# Patient Record
Sex: Male | Born: 1990 | Race: Black or African American | Hispanic: No | Marital: Single | Smoking: Never smoker
Health system: Southern US, Community
[De-identification: ages and names within clinical notes are randomized; demographics above are authoritative.]

## PROBLEM LIST (undated history)

## (undated) DIAGNOSIS — F1010 Alcohol abuse, uncomplicated: Secondary | ICD-10-CM

## (undated) DIAGNOSIS — I10 Essential (primary) hypertension: Secondary | ICD-10-CM

---

## 2016-12-11 ENCOUNTER — Encounter (HOSPITAL_COMMUNITY): Payer: Self-pay | Admitting: Emergency Medicine

## 2016-12-11 ENCOUNTER — Inpatient Hospital Stay (HOSPITAL_COMMUNITY)
Admission: EM | Admit: 2016-12-11 | Discharge: 2016-12-29 | DRG: 896 | Disposition: A | Discharge status: Other Acute Inpatient Hospital | Payer: Managed Care, Other (non HMO) | Attending: Internal Medicine | Admitting: Internal Medicine

## 2016-12-11 ENCOUNTER — Ambulatory Visit (HOSPITAL_COMMUNITY)
Admission: RE | Admit: 2016-12-11 | Discharge: 2016-12-11 | Disposition: A | Discharge status: Home / Self Care | Payer: 59 | Source: Home / Self Care | Attending: Psychiatry | Admitting: Psychiatry

## 2016-12-11 ENCOUNTER — Encounter (HOSPITAL_COMMUNITY): Payer: Self-pay | Admitting: *Deleted

## 2016-12-11 DIAGNOSIS — F1010 Alcohol abuse, uncomplicated: Secondary | ICD-10-CM | POA: Diagnosis not present

## 2016-12-11 DIAGNOSIS — D7589 Other specified diseases of blood and blood-forming organs: Secondary | ICD-10-CM | POA: Diagnosis present

## 2016-12-11 DIAGNOSIS — F10920 Alcohol use, unspecified with intoxication, uncomplicated: Secondary | ICD-10-CM | POA: Diagnosis not present

## 2016-12-11 DIAGNOSIS — K7010 Alcoholic hepatitis without ascites: Secondary | ICD-10-CM | POA: Diagnosis not present

## 2016-12-11 DIAGNOSIS — E876 Hypokalemia: Secondary | ICD-10-CM | POA: Diagnosis present

## 2016-12-11 DIAGNOSIS — Z6829 Body mass index (BMI) 29.0-29.9, adult: Secondary | ICD-10-CM

## 2016-12-11 DIAGNOSIS — F329 Major depressive disorder, single episode, unspecified: Secondary | ICD-10-CM | POA: Diagnosis present

## 2016-12-11 DIAGNOSIS — F10929 Alcohol use, unspecified with intoxication, unspecified: Secondary | ICD-10-CM | POA: Diagnosis present

## 2016-12-11 DIAGNOSIS — F10232 Alcohol dependence with withdrawal with perceptual disturbance: Secondary | ICD-10-CM

## 2016-12-11 DIAGNOSIS — J9600 Acute respiratory failure, unspecified whether with hypoxia or hypercapnia: Secondary | ICD-10-CM

## 2016-12-11 DIAGNOSIS — F10239 Alcohol dependence with withdrawal, unspecified: Principal | ICD-10-CM | POA: Diagnosis present

## 2016-12-11 DIAGNOSIS — R441 Visual hallucinations: Secondary | ICD-10-CM | POA: Diagnosis not present

## 2016-12-11 DIAGNOSIS — F32A Depression, unspecified: Secondary | ICD-10-CM

## 2016-12-11 DIAGNOSIS — Z8249 Family history of ischemic heart disease and other diseases of the circulatory system: Secondary | ICD-10-CM

## 2016-12-11 DIAGNOSIS — R4587 Impulsiveness: Secondary | ICD-10-CM | POA: Diagnosis present

## 2016-12-11 DIAGNOSIS — G9340 Encephalopathy, unspecified: Secondary | ICD-10-CM | POA: Diagnosis present

## 2016-12-11 DIAGNOSIS — F10932 Alcohol use, unspecified with withdrawal with perceptual disturbance: Secondary | ICD-10-CM

## 2016-12-11 DIAGNOSIS — K760 Fatty (change of) liver, not elsewhere classified: Secondary | ICD-10-CM | POA: Diagnosis present

## 2016-12-11 DIAGNOSIS — I10 Essential (primary) hypertension: Secondary | ICD-10-CM

## 2016-12-11 DIAGNOSIS — R440 Auditory hallucinations: Secondary | ICD-10-CM | POA: Diagnosis not present

## 2016-12-11 DIAGNOSIS — R4182 Altered mental status, unspecified: Secondary | ICD-10-CM

## 2016-12-11 DIAGNOSIS — I160 Hypertensive urgency: Secondary | ICD-10-CM | POA: Diagnosis present

## 2016-12-11 DIAGNOSIS — F04 Amnestic disorder due to known physiological condition: Secondary | ICD-10-CM

## 2016-12-11 DIAGNOSIS — E663 Overweight: Secondary | ICD-10-CM | POA: Diagnosis present

## 2016-12-11 DIAGNOSIS — G629 Polyneuropathy, unspecified: Secondary | ICD-10-CM | POA: Diagnosis present

## 2016-12-11 DIAGNOSIS — Y908 Blood alcohol level of 240 mg/100 ml or more: Secondary | ICD-10-CM | POA: Diagnosis present

## 2016-12-11 DIAGNOSIS — F419 Anxiety disorder, unspecified: Secondary | ICD-10-CM | POA: Diagnosis present

## 2016-12-11 DIAGNOSIS — R740 Nonspecific elevation of levels of transaminase and lactic acid dehydrogenase [LDH]: Secondary | ICD-10-CM | POA: Diagnosis present

## 2016-12-11 DIAGNOSIS — R7989 Other specified abnormal findings of blood chemistry: Secondary | ICD-10-CM | POA: Diagnosis present

## 2016-12-11 LAB — COMPREHENSIVE METABOLIC PANEL
ALT: 56 U/L (ref 17–63)
ANION GAP: 18 — AB (ref 5–15)
AST: 113 U/L — ABNORMAL HIGH (ref 15–41)
Albumin: 5.3 g/dL — ABNORMAL HIGH (ref 3.5–5.0)
Alkaline Phosphatase: 66 U/L (ref 38–126)
BUN: <5 mg/dL — ABNORMAL LOW (ref 6–20)
CHLORIDE: 100 mmol/L — AB (ref 101–111)
CO2: 23 mmol/L (ref 22–32)
CREATININE: 0.84 mg/dL (ref 0.61–1.24)
Calcium: 8.9 mg/dL (ref 8.9–10.3)
Glucose, Bld: 144 mg/dL — ABNORMAL HIGH (ref 65–99)
Potassium: 3.3 mmol/L — ABNORMAL LOW (ref 3.5–5.1)
SODIUM: 141 mmol/L (ref 135–145)
Total Bilirubin: 0.8 mg/dL (ref 0.3–1.2)
Total Protein: 8.5 g/dL — ABNORMAL HIGH (ref 6.5–8.1)

## 2016-12-11 LAB — CBC
HEMATOCRIT: 38.8 % — AB (ref 39.0–52.0)
Hemoglobin: 13.4 g/dL (ref 13.0–17.0)
MCH: 34 pg (ref 26.0–34.0)
MCHC: 34.5 g/dL (ref 30.0–36.0)
MCV: 98.5 fL (ref 78.0–100.0)
PLATELETS: 221 10*3/uL (ref 150–400)
RBC: 3.94 MIL/uL — AB (ref 4.22–5.81)
RDW: 14.7 % (ref 11.5–15.5)
WBC: 5.2 10*3/uL (ref 4.0–10.5)

## 2016-12-11 LAB — RAPID URINE DRUG SCREEN, HOSP PERFORMED
AMPHETAMINES: NOT DETECTED
BENZODIAZEPINES: NOT DETECTED
Barbiturates: NOT DETECTED
COCAINE: NOT DETECTED
Opiates: NOT DETECTED
Tetrahydrocannabinol: NOT DETECTED

## 2016-12-11 LAB — ETHANOL
ALCOHOL ETHYL (B): 493 mg/dL — AB (ref <5)
ALCOHOL ETHYL (B): 307 mg/dL — AB (ref <5)

## 2016-12-11 MED ORDER — LORAZEPAM 2 MG/ML IJ SOLN
0.0000 mg | Freq: Four times a day (QID) | INTRAMUSCULAR | Status: DC
  Administered 2016-12-11: 2 mg via INTRAVENOUS
  Administered 2016-12-12: 4 mg via INTRAVENOUS
  Filled 2016-12-11 (×3): qty 1

## 2016-12-11 MED ORDER — SODIUM CHLORIDE 0.9 % IV BOLUS (SEPSIS)
1000.0000 mL | Freq: Once | INTRAVENOUS | Status: AC
  Administered 2016-12-11: 1000 mL via INTRAVENOUS

## 2016-12-11 MED ORDER — LORAZEPAM 1 MG PO TABS
0.0000 mg | ORAL_TABLET | Freq: Four times a day (QID) | ORAL | Status: DC
  Administered 2016-12-12: 2 mg via ORAL
  Filled 2016-12-11: qty 2

## 2016-12-11 MED ORDER — LORAZEPAM 1 MG PO TABS: 0.0000 mg | ORAL_TABLET | Freq: Two times a day (BID) | ORAL | Status: DC

## 2016-12-11 MED ORDER — POTASSIUM CHLORIDE CRYS ER 20 MEQ PO TBCR
40.0000 meq | EXTENDED_RELEASE_TABLET | Freq: Once | ORAL | Status: AC
  Administered 2016-12-11: 40 meq via ORAL
  Filled 2016-12-11: qty 2

## 2016-12-11 MED ORDER — THIAMINE HCL 100 MG/ML IJ SOLN
Freq: Once | INTRAVENOUS | Status: AC
  Administered 2016-12-11: 14:00:00 via INTRAVENOUS
  Filled 2016-12-11: qty 1000

## 2016-12-11 MED ORDER — LORAZEPAM 2 MG/ML IJ SOLN: 0.0000 mg | Freq: Two times a day (BID) | INTRAMUSCULAR | Status: DC

## 2016-12-11 NOTE — BH Assessment (Addendum)
Tele Assessment Note  Pt presents to [facility] for assessment accompanied by his cousin Brett Molina. Pt is cooperative and oriented x4. He is currently sitting in a wheelchair b/c he says his knees have been stiff for one week. 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 Pt appears to minimize depressive symptoms and the effects of his alcohol abuse. He reports his mom died 6 mos ago. Pt says that he was arrested for a DUI 4 yrs ago and will get his license back in 4 mos. He reports severe anxiety for most of his life. Pt endorses following depressive symptoms: fatigue, poor concentration, loss of interest in usual pleasures, irritability, isolating and bathing less frequently. Pt reports he had a seizure in Jan but he does not know if seizure was d/t etoh withdrawal. He reports he drinks one pint daily and has done so for 9 mos. Pt endorses current withdrawal symptom of tremors. He reports past withdrawal symptoms of tremors and sweats. He reports he can't sleep at night, but he frequently falls asleep at work. Pt has no hx of outpatient or inpatient MH treatment. Pt denies homicidal thoughts or physical aggression. Pt denies having access to firearms. Pt denies having any legal problems at this time. Pt denies hallucinations. Pt does not appear to be responding to internal stimuli and exhibits no delusional thought. Pt's reality testing appears to be intact. Pt denies homicidal thoughts or physical aggression. Pt denies having access to firearms.   Cousin provides collateral info. She reports she and pt's close relatives are concerned that pt may try to harm himself. She states that pt often mentions wanting to fade away and not exist. She says he often reports SI. Cousin says that he was admitted to an ED this past Jan for dehydration due to excessive alcohol use. She reports pt's boss told him that if he doesn't get help for his drinking, then he will be fired.    Brett Molina is an 26 y.o. male.   Diagnosis: Alcohol Use Disorder, Severe Major Depressive Disorder, Recurrent, Severe without Psychotic Features Unspecified Anxiety Disorder  Past Medical History: No past medical history on file.  No past surgical history on file.  Family History: No family history on file.  Social History:  reports that he drinks alcohol. His tobacco and drug histories are not on file.  Additional Social History:  Alcohol / Drug Use Pain Medications: pt denies abuse - see pta meds list Prescriptions: pt denies abuse - see pta meds list Over the Counter: pt denies abuse - see pta meds list History of alcohol / drug use?: Yes Longest period of sobriety (when/how long): n/a Negative Consequences of Use: Work / Programmer, multimedia, Armed forces operational officer Withdrawal Symptoms: Tremors Substance #1 Name of Substance 1: etoh 1 - Age of First Use: 19 1 - Amount (size/oz): pint of vodka 1 - Frequency: daily 1 - Duration: 9 mos 1 - Last Use / Amount: 12/10/16 - 2330   CIWA: CIWA-Ar BP: (!) 161/94 Pulse Rate: (!) 125 COWS:    PATIENT STRENGTHS: (choose at least two) Average or above average intelligence Capable of independent living Communication skills Physical Health Supportive family/friends Work skills  Allergies: Allergies not on file  Home Medications:  No prescriptions prior to admission.    OB/GYN Status:  No LMP for male patient.  General Assessment Data Location of Assessment: [facility] Assessment Services TTS Assessment: In system Is this a Tele or Face-to-Face  Assessment?: Face-to-Face Is this an Initial Assessment or a Re-assessment for this encounter?: Initial Assessment Marital status: Single Maiden name: n/a Is patient pregnant?: No Pregnancy Status: No Living Arrangements: Other relatives, Non-relatives/Friends (brother, roommate) Can pt return to current living arrangement?: Yes Admission Status: Voluntary Is patient capable of signing voluntary admission?:  Yes Referral Source: Self/Family/Friend Insurance type: Product/process development scientist Exam [facility] Walk-in ONLY) Medical Exam completed: Yes  Crisis Care Plan Living Arrangements: Other relatives, Non-relatives/Friends (brother, roommate) Name of Psychiatrist: none Name of Therapist: none  Education Status Is patient currently in school?: No Highest grade of school patient has completed: 65 Name of school: [employer]  Risk to self with the past 6 months Suicidal Ideation: Yes-Currently Present Has patient been a risk to self within the past 6 months prior to admission? : No Suicidal Intent: No Has patient had any suicidal intent within the past 6 months prior to admission? : No Is patient at risk for suicide?: Yes Suicidal Plan?: No Has patient had any suicidal plan within the past 6 months prior to admission? : No What has been your use of drugs/alcohol within the last 12 months?: daily etoh use Previous Attempts/Gestures: Yes How many times?: 0 Other Self Harm Risks: none Intentional Self Injurious Behavior: None Family Suicide History: Unknown Recent stressful life event(s):  (mom died 6 mos ago, trouble at work, ) Persecutory voices/beliefs?: No Depression: Yes Depression Symptoms: Feeling angry/irritable, Loss of interest in usual pleasures, Fatigue, Isolating, Insomnia Substance abuse history and/or treatment for substance abuse?: Yes Suicide prevention information given to non-admitted patients: Not applicable  Risk to Others within the past 6 months Homicidal Ideation: No Does patient have any lifetime risk of violence toward others beyond the six months prior to admission? : No Thoughts of Harm to Others: No Current Homicidal Intent: No Current Homicidal Plan: No Access to Homicidal Means: No Identified Victim: n/a History of harm to others?: No Assessment of Violence: None Noted Violent Behavior Description: pt calm and cooperative Does patient have access  to weapons?: No Criminal Charges Pending?: No Does patient have a court date: No Is patient on probation?: No  Psychosis Hallucinations: None noted Delusions: None noted  Mental Status Report Appearance/Hygiene: Unremarkable (appears older than stated age) Eye Contact: Good Motor Activity: Freedom of movement Speech: Logical/coherent Level of Consciousness: Alert, Quiet/awake Mood: Depressed, Anxious, Anhedonia, Sad Affect: Appropriate to circumstance, Depressed, Sad Anxiety Level: Severe Thought Processes: Coherent, Relevant Judgement: Unimpaired Orientation: Person, Place, Time, Situation Obsessive Compulsive Thoughts/Behaviors: None  Cognitive Functioning Concentration: Decreased Memory: Remote Intact, Recent Intact IQ: Average Insight: Fair Impulse Control: Poor Appetite: Fair Sleep: Decreased Vegetative Symptoms: Decreased grooming  ADLScreening ([facility] Assessment Services) Patient's cognitive ability adequate to safely complete daily activities?: Yes Patient able to express need for assistance with ADLs?: Yes Independently performs ADLs?: Yes (appropriate for developmental age)  Prior Inpatient Therapy Prior Inpatient Therapy: No  Prior Outpatient Therapy Prior Outpatient Therapy: No Does patient have an ACCT team?: No Does patient have Intensive In-House Services?  : No Does patient have [facility] services? : No Does patient have P4CC services?: No  ADL Screening (condition at time of admission) Patient's cognitive ability adequate to safely complete daily activities?: Yes Is the patient deaf or have difficulty hearing?: (P) No Does the patient have difficulty seeing, even when wearing glasses/contacts?: (P) No Does the patient have difficulty concentrating, remembering, or making decisions?: (P) Yes Patient able to express need for assistance with ADLs?: Yes Does the patient  have difficulty dressing or bathing?: (P) No Independently performs ADLs?: Yes  (appropriate for developmental age) Does the patient have difficulty walking or climbing stairs?:  (pt reports trouble bending knees for past week, but he is able to walk) Weakness of Legs: None Weakness of Arms/Hands: None  Home Assistive Devices/Equipment Home Assistive Devices/Equipment: None    Abuse/Neglect Assessment (Assessment to be complete while patient is alone) Physical Abuse: Denies Verbal Abuse: Denies Sexual Abuse: Denies Exploitation of patient/patient's resources: Denies Self-Neglect: Denies     Merchant navy officer (For Healthcare) Does Patient Have a Medical Advance Directive?: No Would patient like information on creating a medical advance directive?: No - Patient declined    Additional Information 1:1 In Past 12 Months?: No CIRT Risk: No Elopement Risk: No Does patient have medical clearance?: No     Disposition:  Disposition Initial Assessment Completed for this Encounter: Yes Disposition of Patient: Inpatient treatment program Type of inpatient treatment program: Adult (tina okonkwo np recommends inpatient treatment)  Pt was transferred to [facility] for medical clearance by Juel Burrow. Charge RN Johnny Bridge at [facility] notified. TTS should have bed for pt at [facility] later today. TTS will notify ED when pt can be transported.   Hampton Cost P 12/11/2016 11:54 AM

## 2016-12-11 NOTE — ED Provider Notes (Signed)
[facility]-EMERGENCY DEPT Provider Note   CSN: [id number] Arrival date & time: 12/11/16  1145  By signing my name below, I, Rosario Adie, attest that this documentation has been prepared under the direction and in the presence of Gladine Plude, PA-C.  Electronically Signed: Rosario Adie, ED Scribe. 12/11/16. 12:58 PM.  History   Chief Complaint Chief Complaint  Patient presents with  . Medical Clearance   The history is provided by the patient. No language interpreter was used.    HPI Comments: Brett Molina is a 26 y.o. male with no pertinent PMHx, who presents to the Emergency Department complaining of recent persistent lower leg stiffness beginning two weeks ago. Per pt, two weeks ago he had an acute onset of b/l lower leg stiffness with some mild paraesthesias which he relates to his alcoholism. He also experiences this sensation into his b/l hands. No recent trauma or injury to the leg. No alleviating or modifying factors are noted. He has not taken anything for his symptoms at home prior to his arrival in the ED. He is still able to ambulate w/o difficulty.  He was sent to the ED from [facility] for medical clearance prior to their admission. Pt is not medicated daily for any reason. He denies numbness, weakness, gait abnormality, abdominal pain, fevers, back pain, chest pain, shortness of breath, nausea, vomiting, cough, congestion, sneezing, rhinorrhea, dysuria, hematuria, leg swelling, SI/HI, auditory/visual hallucinations, or any other associated symptoms.   History reviewed. No pertinent past medical history.  There are no active problems to display for this patient.  History reviewed. No pertinent surgical history.  Home Medications    Prior to Admission medications   Not on File   Family History No family history on file.  Social History Social History  Substance Use Topics  . Smoking status: Never Smoker  . Smokeless tobacco: Never Used  . Alcohol use 8.4 oz/week      14 Standard drinks or equivalent per week   Allergies   Patient has no known allergies.  Review of Systems Review of Systems  Constitutional: Negative for fever.  HENT: Negative for congestion, rhinorrhea and sneezing.   Respiratory: Negative for cough and shortness of breath.   Cardiovascular: Negative for chest pain and leg swelling.  Gastrointestinal: Negative for abdominal pain, nausea and vomiting.  Genitourinary: Negative for dysuria and hematuria.  Musculoskeletal: Positive for myalgias. Negative for back pain.  Neurological: Negative for weakness and numbness.  Psychiatric/Behavioral: Negative for hallucinations and suicidal ideas.   Physical Exam Updated Vital Signs BP (!) 152/86 (BP Location: Right Arm)   Pulse (!) 105   Temp 98.1 F (36.7 C) (Oral)   Resp 16   SpO2 95%   Physical Exam  Constitutional: He appears well-developed and well-nourished. No distress.  HENT:  Head: Normocephalic and atraumatic.  Eyes: Conjunctivae and EOM are normal. No scleral icterus.  Neck: Normal range of motion.  Cardiovascular: Normal rate, regular rhythm and normal heart sounds.   Pulmonary/Chest: Effort normal and breath sounds normal. No respiratory distress.  Abdominal: Soft. He exhibits no distension. There is no tenderness.  Musculoskeletal: He exhibits no edema or deformity.  Normal active and passive ROM. Strength 5/5 b/l in BLE. No objective signs of numbness on PE. No visible bruising or deformities noted.  No tenderness to palpation of the back.  Neurological: He is alert.  Skin: No rash noted. He is not diaphoretic.  Psychiatric: He has a normal mood and affect.  Nursing note and  vitals reviewed.  ED Treatments / Results  DIAGNOSTIC STUDIES: Oxygen Saturation is 95% on RA, adequate by my interpretation.   COORDINATION OF CARE: 12:58 PM-Discussed next steps with pt. Pt verbalized understanding and is agreeable with the plan.   Labs (all labs ordered are  listed, but only abnormal results are displayed) Labs Reviewed  COMPREHENSIVE METABOLIC PANEL - Abnormal; Notable for the following:       Result Value   Potassium 3.3 (*)    Chloride 100 (*)    Glucose, Bld 144 (*)    BUN <5 (*)    Total Protein 8.5 (*)    Albumin 5.3 (*)    AST 113 (*)    Anion gap 18 (*)    All other components within normal limits  ETHANOL - Abnormal; Notable for the following:    Alcohol, Ethyl (B) 493 (*)    All other components within normal limits  CBC - Abnormal; Notable for the following:    RBC 3.94 (*)    HCT 38.8 (*)    All other components within normal limits  RAPID URINE DRUG SCREEN, HOSP PERFORMED  ETHANOL    EKG  EKG Interpretation None      Radiology No results found.  Procedures Procedures   Medications Ordered in ED Medications  LORazepam (ATIVAN) injection 0-4 mg (0 mg Intravenous Not Given 12/11/16 1421)    Or  LORazepam (ATIVAN) tablet 0-4 mg ( Oral See Alternative 12/11/16 1421)  LORazepam (ATIVAN) injection 0-4 mg (not administered)    Or  LORazepam (ATIVAN) tablet 0-4 mg (not administered)  potassium chloride SA (K-DUR,KLOR-CON) CR tablet 40 mEq (40 mEq Oral Given 12/11/16 1420)  sodium chloride 0.9 % 1,000 mL with thiamine 100 mg, folic acid 1 mg, multivitamins adult 10 mL infusion ( Intravenous New Bag/Given 12/11/16 1420)  sodium chloride 0.9 % bolus 1,000 mL (1,000 mLs Intravenous New Bag/Given 12/11/16 1420)    Initial Impression / Assessment and Plan / ED Course  I have reviewed the triage vital signs and the nursing notes.  Pertinent labs & imaging results that were available during my care of the patient were reviewed by me and considered in my medical decision making (see chart for details).    Patient presents to the emergency room from [facility] for medical clearance. Patient has been experiencing bilateral lower extremity stiffness and paresthesias. He has not tried any medication to help with symptoms. Patient is  currently seeking [facility] management for alcoholism and depression. Upon arrival in the ED, CBC was unremarkable. UDS negative. Ethanol level was found to be 493. CMP revealed potassium of 3.3. Patient also has an ion gap of 18. This is likely due to alcoholic ketoacidosis. Patient is speaking in complete sentences and does not appear to be in distress. He is awake and alert. He denies SI, HI, AH or VH. Spoke to be [facility] who states that the maximum alcohol limit is 200. Will give patient saline bolus and banana bag to help decrease alcohol level. Also put in CIWA protocol. Patient does not appear to be actively withdrawing at this time. Heart rate is 105 but patient appears otherwise stable. Will check alcohol level after banana bag and fluid bolus. Will recommend ibuprofen or Aleve for lower extremity stiffness which could be due to overuse.  Redraw of alcohol level was pending at end of shift. If level is 200 last, patient will be transferred for the [facility] evaluation. Care signed out to Will Dansie, PA-C pending alcohol  level.   Final Clinical Impressions(s) / ED Diagnoses   Final diagnoses:  Alcohol abuse  Depression, unspecified depression type   New Prescriptions New Prescriptions   No medications on file   I personally performed the services described in this documentation, which was scribed in my presence. The recorded information has been reviewed and is accurate.     Dietrich Pates, PA-C 12/11/16 Gwynneth Munson, MD 12/12/16 1009

## 2016-12-11 NOTE — ED Notes (Signed)
Pt alcohol elevated.  Unable to move to [facility] until less than 200

## 2016-12-11 NOTE — H&P (Signed)
Behavioral Health Medical Screening Exam  Brett Molina is an 26 y.o. male.  Per the assessment completed today by Brett Molina, "Pt presents to [facility] for assessment accompanied by his cousin Brett Molina. Pt is cooperative and oriented x4. He is currently sitting in a wheelchair b/c he says his knees have been stiff for one week. 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 Pt appears to minimize depressive symptoms and the effects of his alcohol abuse. He reports his mom died 6 mos ago. Pt says that he was arrested for a DUI 4 yrs ago and will get his license back in 4 mos. He reports severe anxiety for most of his life. Pt endorses following depressive symptoms: fatigue, poor concentration, loss of interest in usual pleasures, irritability, isolating and bathing less frequently. Pt reports he had a seizure in Jan but he does not know if seizure was d/t etoh withdrawal. He reports he drinks one pint daily and has done so for 9 mos. Pt endorses current withdrawal symptom of tremors. He reports past withdrawal symptoms of tremors and sweats. He reports he can't sleep at night, but he frequently falls asleep at work. Pt has no hx of outpatient or inpatient MH treatment. Pt denies homicidal thoughts or physical aggression. Pt denies having access to firearms. Pt denies having any legal problems at this time. Pt denies hallucinations. Pt does not appear to be responding to internal stimuli and exhibits no delusional thought. Pt's reality testing appears to be intact. Pt denies homicidal thoughts or physical aggression. Pt denies having access to firearms.   Cousin provides collateral info. She reports she and pt's close relatives are concerned that pt may try to harm himself. She states that pt often mentions wanting to fade away and not exist. She says he often reports SI. Cousin says that he was admitted to an ED this past Jan for dehydration due to excessive  alcohol use. She reports pt's boss told him that if he doesn't get help for his drinking, then he will be fired".   Total Time spent with patient: 30 minutes  Psychiatric Specialty Exam: Physical Exam  ROS  Blood pressure (!) 161/94, pulse (!) 125, temperature 98.7 F (37.1 C), temperature source Oral, resp. rate 18, SpO2 98 %.There is no height or weight on file to calculate BMI.  General Appearance: Unremarkable  Eye Contact:  Good  Speech:  Clear and Coherent and Normal Rate  Volume:  Normal  Mood:  helpless  Affect:  Labile  Thought Process:  Coherent and Goal Directed  Orientation:  Full (Time, Place, and Person)  Thought Content:  WDL and Logical  Suicidal Thoughts:  No  Homicidal Thoughts:  No  Memory:  Immediate;   Good Recent;   Good Remote;   Fair  Judgement:  Intact  Insight:  Good  Psychomotor Activity:  Normal  Concentration: Concentration: Good and Attention Span: Good  Recall:  Good  Fund of Knowledge:Good  Language: Good  Akathisia:  Negative  Handed:  Right  AIMS (if indicated):     Assets:  Communication Skills Desire for Improvement Financial Resources/Insurance Housing Talents/Skills Transportation Vocational/Educational  Sleep:       Musculoskeletal: Strength & Muscle Tone: within normal limits Gait & Station: unsteady Patient leans: N/A  Blood pressure (!) 161/94, pulse (!) 125, temperature 98.7 F (37.1 C), temperature source Oral, resp. rate 18, SpO2 98 %.  Recommendations: Patient reports several stressors  predisposing him as a high risk for suicide including underlying depression secondary to the death of mother this 04/03/2018as well as severe substance use disorder, will recommend inpatient to the [facility] for treatment and stabilization. Also, patient's vital signs seems very unstable and given patient's recent history of seizure in January 2018, will refere patient to the hospital for medical clearance prior to inpatient admission to the  [facility].    Brett Pereyra, NP 12/11/2016, 12:52 PM

## 2016-12-11 NOTE — ED Notes (Signed)
Bed: WA31 Expected date:  Expected time:  Means of arrival:  Comments: 

## 2016-12-11 NOTE — BH Assessment (Signed)
Per Larey Seat NP, patient meets criteria for INPT treatment. Patient accepted to [facility]; Room #300-2. Nursing report [phone].  Patient is voluntary. Support paperwork has been completed. Patient to be transported to [facility] via [place].

## 2016-12-11 NOTE — ED Triage Notes (Signed)
Pt here from [facility] to seek medical clearance.  Pt being admitted for depression and ETOH abuse.  Pt denies SI/HI/AVH.

## 2016-12-11 NOTE — ED Notes (Signed)
Bed: WTR8 Expected date:  Expected time:  Means of arrival:  Comments: 

## 2016-12-12 ENCOUNTER — Encounter (HOSPITAL_COMMUNITY): Payer: Self-pay

## 2016-12-12 DIAGNOSIS — F1010 Alcohol abuse, uncomplicated: Secondary | ICD-10-CM | POA: Diagnosis present

## 2016-12-12 DIAGNOSIS — R443 Hallucinations, unspecified: Secondary | ICD-10-CM | POA: Diagnosis not present

## 2016-12-12 DIAGNOSIS — R441 Visual hallucinations: Secondary | ICD-10-CM | POA: Diagnosis not present

## 2016-12-12 DIAGNOSIS — I10 Essential (primary) hypertension: Secondary | ICD-10-CM | POA: Diagnosis not present

## 2016-12-12 DIAGNOSIS — F10920 Alcohol use, unspecified with intoxication, uncomplicated: Secondary | ICD-10-CM | POA: Diagnosis not present

## 2016-12-12 DIAGNOSIS — F10239 Alcohol dependence with withdrawal, unspecified: Secondary | ICD-10-CM | POA: Diagnosis present

## 2016-12-12 DIAGNOSIS — R440 Auditory hallucinations: Secondary | ICD-10-CM | POA: Diagnosis not present

## 2016-12-12 DIAGNOSIS — I160 Hypertensive urgency: Secondary | ICD-10-CM | POA: Diagnosis present

## 2016-12-12 DIAGNOSIS — F10231 Alcohol dependence with withdrawal delirium: Secondary | ICD-10-CM | POA: Diagnosis not present

## 2016-12-12 DIAGNOSIS — F10929 Alcohol use, unspecified with intoxication, unspecified: Secondary | ICD-10-CM | POA: Diagnosis present

## 2016-12-12 DIAGNOSIS — R4587 Impulsiveness: Secondary | ICD-10-CM | POA: Diagnosis present

## 2016-12-12 DIAGNOSIS — Y908 Blood alcohol level of 240 mg/100 ml or more: Secondary | ICD-10-CM | POA: Diagnosis present

## 2016-12-12 DIAGNOSIS — F419 Anxiety disorder, unspecified: Secondary | ICD-10-CM | POA: Diagnosis present

## 2016-12-12 DIAGNOSIS — E876 Hypokalemia: Secondary | ICD-10-CM | POA: Diagnosis present

## 2016-12-12 DIAGNOSIS — E663 Overweight: Secondary | ICD-10-CM | POA: Diagnosis present

## 2016-12-12 DIAGNOSIS — Z8249 Family history of ischemic heart disease and other diseases of the circulatory system: Secondary | ICD-10-CM | POA: Diagnosis not present

## 2016-12-12 DIAGNOSIS — R740 Nonspecific elevation of levels of transaminase and lactic acid dehydrogenase [LDH]: Secondary | ICD-10-CM | POA: Diagnosis present

## 2016-12-12 DIAGNOSIS — K7010 Alcoholic hepatitis without ascites: Secondary | ICD-10-CM | POA: Diagnosis present

## 2016-12-12 DIAGNOSIS — G629 Polyneuropathy, unspecified: Secondary | ICD-10-CM | POA: Diagnosis present

## 2016-12-12 DIAGNOSIS — R7989 Other specified abnormal findings of blood chemistry: Secondary | ICD-10-CM | POA: Diagnosis present

## 2016-12-12 DIAGNOSIS — Z6829 Body mass index (BMI) 29.0-29.9, adult: Secondary | ICD-10-CM | POA: Diagnosis not present

## 2016-12-12 DIAGNOSIS — D7589 Other specified diseases of blood and blood-forming organs: Secondary | ICD-10-CM | POA: Diagnosis present

## 2016-12-12 DIAGNOSIS — F329 Major depressive disorder, single episode, unspecified: Secondary | ICD-10-CM | POA: Diagnosis present

## 2016-12-12 DIAGNOSIS — G9340 Encephalopathy, unspecified: Secondary | ICD-10-CM | POA: Diagnosis present

## 2016-12-12 DIAGNOSIS — K760 Fatty (change of) liver, not elsewhere classified: Secondary | ICD-10-CM | POA: Diagnosis present

## 2016-12-12 LAB — MRSA PCR SCREENING: MRSA BY PCR: NEGATIVE

## 2016-12-12 LAB — ETHANOL: ALCOHOL ETHYL (B): 209 mg/dL — AB (ref <5)

## 2016-12-12 MED ORDER — SODIUM CHLORIDE 0.9 % IV BOLUS (SEPSIS)
1000.0000 mL | Freq: Once | INTRAVENOUS | Status: AC
  Administered 2016-12-12: 1000 mL via INTRAVENOUS

## 2016-12-12 MED ORDER — FOLIC ACID 1 MG PO TABS
1.0000 mg | ORAL_TABLET | Freq: Every day | ORAL | Status: DC
  Administered 2016-12-12 – 2016-12-29 (×18): 1 mg via ORAL
  Filled 2016-12-12 (×18): qty 1

## 2016-12-12 MED ORDER — LABETALOL HCL 100 MG PO TABS
100.0000 mg | ORAL_TABLET | Freq: Once | ORAL | Status: AC
  Administered 2016-12-12: 100 mg via ORAL
  Filled 2016-12-12: qty 1

## 2016-12-12 MED ORDER — NITROGLYCERIN IN D5W 200-5 MCG/ML-% IV SOLN
0.0000 ug/min | INTRAVENOUS | Status: DC
  Administered 2016-12-13: 105 ug/min via INTRAVENOUS
  Administered 2016-12-13: 5 ug/min via INTRAVENOUS
  Administered 2016-12-13: 105 ug/min via INTRAVENOUS
  Administered 2016-12-14: 85 ug/min via INTRAVENOUS
  Administered 2016-12-14 (×2): 110 ug/min via INTRAVENOUS
  Administered 2016-12-15: 25 ug/min via INTRAVENOUS
  Filled 2016-12-12 (×7): qty 250

## 2016-12-12 MED ORDER — LORAZEPAM 2 MG/ML IJ SOLN
1.0000 mg | Freq: Four times a day (QID) | INTRAMUSCULAR | Status: DC | PRN
  Administered 2016-12-12: 1 mg via INTRAVENOUS
  Filled 2016-12-12: qty 1

## 2016-12-12 MED ORDER — VITAMIN B-1 100 MG PO TABS
100.0000 mg | ORAL_TABLET | Freq: Every day | ORAL | Status: DC
  Administered 2016-12-12 – 2016-12-29 (×16): 100 mg via ORAL
  Filled 2016-12-12 (×17): qty 1

## 2016-12-12 MED ORDER — ALUM & MAG HYDROXIDE-SIMETH 200-200-20 MG/5ML PO SUSP: 30.0000 mL | ORAL | Status: DC | PRN

## 2016-12-12 MED ORDER — LORAZEPAM 2 MG/ML IJ SOLN
2.0000 mg | Freq: Once | INTRAMUSCULAR | Status: AC
  Administered 2016-12-12: 2 mg via INTRAMUSCULAR
  Filled 2016-12-12: qty 1

## 2016-12-12 MED ORDER — LORAZEPAM 2 MG/ML IJ SOLN
2.0000 mg | INTRAMUSCULAR | Status: DC | PRN
  Administered 2016-12-12: 1 mg via INTRAVENOUS
  Administered 2016-12-13 – 2016-12-14 (×9): 2 mg via INTRAVENOUS
  Administered 2016-12-14: 3 mg via INTRAVENOUS
  Administered 2016-12-14: 2 mg via INTRAVENOUS
  Administered 2016-12-14: 3 mg via INTRAVENOUS
  Administered 2016-12-14: 2 mg via INTRAVENOUS
  Administered 2016-12-14: 3 mg via INTRAVENOUS
  Administered 2016-12-14 – 2016-12-15 (×13): 2 mg via INTRAVENOUS
  Administered 2016-12-16 (×2): 3 mg via INTRAVENOUS
  Administered 2016-12-16 – 2016-12-18 (×10): 2 mg via INTRAVENOUS
  Filled 2016-12-12 (×4): qty 1
  Filled 2016-12-12 (×2): qty 2
  Filled 2016-12-12 (×7): qty 1
  Filled 2016-12-12: qty 2
  Filled 2016-12-12 (×2): qty 1
  Filled 2016-12-12: qty 2
  Filled 2016-12-12 (×8): qty 1
  Filled 2016-12-12: qty 2
  Filled 2016-12-12 (×5): qty 1
  Filled 2016-12-12: qty 2
  Filled 2016-12-12 (×9): qty 1

## 2016-12-12 MED ORDER — LORAZEPAM 2 MG/ML IJ SOLN
2.0000 mg | INTRAMUSCULAR | Status: AC
  Administered 2016-12-12: 2 mg via INTRAVENOUS
  Filled 2016-12-12: qty 1

## 2016-12-12 MED ORDER — HYDRALAZINE HCL 20 MG/ML IJ SOLN
10.0000 mg | INTRAMUSCULAR | Status: DC | PRN
  Administered 2016-12-14 (×3): 20 mg via INTRAVENOUS
  Administered 2016-12-14: 10 mg via INTRAVENOUS
  Administered 2016-12-15 – 2016-12-17 (×7): 20 mg via INTRAVENOUS
  Administered 2016-12-18: 10 mg via INTRAVENOUS
  Administered 2016-12-20 (×2): 20 mg via INTRAVENOUS
  Administered 2016-12-21: 10 mg via INTRAVENOUS
  Filled 2016-12-12 (×16): qty 1

## 2016-12-12 MED ORDER — ONDANSETRON HCL 4 MG PO TABS: 4.0000 mg | ORAL_TABLET | Freq: Four times a day (QID) | ORAL | Status: DC | PRN

## 2016-12-12 MED ORDER — SODIUM CHLORIDE 0.9 % IV SOLN
INTRAVENOUS | Status: DC
  Administered 2016-12-12 – 2016-12-19 (×12): via INTRAVENOUS

## 2016-12-12 MED ORDER — ADULT MULTIVITAMIN W/MINERALS CH
1.0000 | ORAL_TABLET | Freq: Every day | ORAL | Status: DC
  Administered 2016-12-12 – 2016-12-29 (×17): 1 via ORAL
  Filled 2016-12-12 (×17): qty 1

## 2016-12-12 MED ORDER — TRAZODONE HCL 50 MG PO TABS: 50.0000 mg | ORAL_TABLET | Freq: Every evening | ORAL | Status: DC | PRN

## 2016-12-12 MED ORDER — DEXMEDETOMIDINE HCL IN NACL 400 MCG/100ML IV SOLN
0.2000 ug/kg/h | INTRAVENOUS | Status: DC
  Administered 2016-12-12: 0.4 ug/kg/h via INTRAVENOUS
  Administered 2016-12-13 – 2016-12-14 (×6): 0.7 ug/kg/h via INTRAVENOUS
  Administered 2016-12-14 (×3): 1 ug/kg/h via INTRAVENOUS
  Filled 2016-12-12 (×13): qty 100

## 2016-12-12 MED ORDER — LABETALOL HCL 200 MG PO TABS
200.0000 mg | ORAL_TABLET | Freq: Two times a day (BID) | ORAL | Status: DC
  Administered 2016-12-12 – 2016-12-14 (×5): 200 mg via ORAL
  Filled 2016-12-12 (×5): qty 1

## 2016-12-12 MED ORDER — LORAZEPAM 2 MG/ML IJ SOLN
2.0000 mg | Freq: Once | INTRAMUSCULAR | Status: AC
  Administered 2016-12-12: 2 mg via INTRAVENOUS
  Filled 2016-12-12: qty 1

## 2016-12-12 MED ORDER — HYDRALAZINE HCL 20 MG/ML IJ SOLN
10.0000 mg | INTRAMUSCULAR | Status: DC | PRN
  Administered 2016-12-12: 10 mg via INTRAVENOUS
  Filled 2016-12-12: qty 1

## 2016-12-12 MED ORDER — LORAZEPAM 1 MG PO TABS
1.0000 mg | ORAL_TABLET | Freq: Four times a day (QID) | ORAL | Status: DC | PRN
  Administered 2016-12-12 (×2): 1 mg via ORAL
  Filled 2016-12-12 (×2): qty 1

## 2016-12-12 MED ORDER — SODIUM CHLORIDE 0.9% FLUSH
3.0000 mL | Freq: Two times a day (BID) | INTRAVENOUS | Status: DC
  Administered 2016-12-12 – 2016-12-18 (×12): 3 mL via INTRAVENOUS
  Administered 2016-12-19: 22:00:00 via INTRAVENOUS
  Administered 2016-12-20 – 2016-12-25 (×10): 3 mL via INTRAVENOUS

## 2016-12-12 MED ORDER — MAGNESIUM HYDROXIDE 400 MG/5ML PO SUSP: 30.0000 mL | Freq: Every day | ORAL | Status: DC | PRN

## 2016-12-12 MED ORDER — LABETALOL HCL 100 MG PO TABS
100.0000 mg | ORAL_TABLET | Freq: Two times a day (BID) | ORAL | Status: DC
  Administered 2016-12-12: 100 mg via ORAL
  Filled 2016-12-12: qty 1

## 2016-12-12 MED ORDER — ONDANSETRON HCL 4 MG/2ML IJ SOLN: 4.0000 mg | Freq: Four times a day (QID) | INTRAMUSCULAR | Status: DC | PRN

## 2016-12-12 MED ORDER — ACETAMINOPHEN 325 MG PO TABS
650.0000 mg | ORAL_TABLET | Freq: Four times a day (QID) | ORAL | Status: DC | PRN
  Administered 2016-12-12: 650 mg via ORAL
  Filled 2016-12-12: qty 2

## 2016-12-12 MED ORDER — THIAMINE HCL 100 MG/ML IJ SOLN
100.0000 mg | Freq: Every day | INTRAMUSCULAR | Status: DC
  Administered 2016-12-13 – 2016-12-14 (×2): 100 mg via INTRAVENOUS
  Filled 2016-12-12 (×3): qty 2

## 2016-12-12 NOTE — Progress Notes (Signed)
Called by RN at start of shift concerning this patient admitted earlier in the day from the [facility] for alcohol withdrawal requiring high doses of Ativan despite his alcohol level >400. Patient has grown increasingly tremulous with heart rate reaching the 170s and persistent hypertensive urgency. He is sweaty and has a coarse tremor on exam with elevated heart rate and blood pressures. He denies hallucinations at this time.   Patient is clearly experiencing severe withdrawal despite current management with 1 mg of Ativan every 6 hours. He will be given Ativan 2 mg IV now and transferred to SDU for closer monitoring and more frequent Ativan dosing.

## 2016-12-12 NOTE — Progress Notes (Signed)
Pt arrived to floor from ED at 1230 admitted for ETOH abuse/withdrawals.   Pt is A/Ox3, not confused but slow to understand and respond. Obeys commands and is cooperative.  SBP 182/115, HR 115 sweating,  CIWA score at this times is 11. Pt extremely tremulous not able to hold his drink. Tremors are whole body. Pt not able to stand more than 5 sec. Gave pt PO Ativan 1mg  per order. ( too soon for IV ativan).  Called Dr. Elisabeth Pigeon regarding BP who order stat po Labetolol 100mg .  Recheck BP was 162/103 HR 100.  Pt still tremulous through the day and BP remaining elevated. Gave Ativan 1mg  IV at 1430.  AT times pt slightly confused when he moved the electrodes around on his chest and at one point found him with legs half way out of bed.  At 1815 pt's tremors increased and very diaphoretic.  BP 164/120 and HR at 170. At this point there was nothing available medication wise to give him and I became concerned for pt.   Called Night on call Physician who ordered 2mg  Ativan IV stat, came to see pt and ordered transfer for patient to go to Step Down Unit.

## 2016-12-12 NOTE — Progress Notes (Addendum)
RN notified MD of pt BP remaining elevated, 196/137 currently. Will continue to monitor.  Order received for Nitroglycerin gtt. Pt current BP came down to 166/108. Nitroglycerin gtt not started at this time, due to pt SBP being within goal range. Will continue to monitor.

## 2016-12-12 NOTE — Progress Notes (Signed)
Patient still having shakes after previous Ativan for CIWA.  Order received to give Ativan 2mg  now. Pt resting, awaiting transport. Charl Wellen P Janilah Hojnacki

## 2016-12-12 NOTE — Consult Note (Signed)
[facility] Consult   Reason for Consult: Alcohol withdrawal Referring Physician:  ED psych consult Patient Identification: Brett Molina MRN:  [id number] Principal Diagnosis: Alcoholic intoxication without complication [facility]) Diagnosis:   Patient Active Problem List   Diagnosis Date Noted  . Alcoholic intoxication without complication ([place]) [E36.629] 12/12/2016  . Hypokalemia [E87.6] 12/12/2016  . Alcoholic hepatitis without ascites [K70.10] 12/12/2016    Total Time spent with patient: 20 minutes  Subjective:   Brett Molina is a 26 y.o. male patient admitted with acute alcohol withdrawal.  He was at [facility] but transferred to ED for gait difficulties and high lorazepam requirement.   HPI:  Evaluated patient this AM. He remains tremulous, with poor appetite. Continues with severe EtOH withdrawal symptoms. Has required 12 mg lorazepam in 12 hours, and anticipate ongoing need for high dose lorazepam.  Discussed with ED team and recommended medical admission.  Patient is agreeable to medical admission and feels that he can maintain safety with self and others on hospital floor.   Past Psychiatric History: See intake H&P for full details. Reviewed, with no updates at this time.  Risk to Self: Is patient at risk for suicide?: No Risk to Others:   Prior Inpatient Therapy:   Prior Outpatient Therapy:    Past Medical History: History reviewed. No pertinent past medical history. History reviewed. No pertinent surgical history. Family History: No family history on file. Family Psychiatric  History: See intake H&P for full details. Reviewed, with no updates at this time.  Social History:  History  Alcohol Use  . 8.4 oz/week  . 14 Standard drinks or equivalent per week     History  Drug Use No    Social History   Social History  . Marital status: Single    Spouse name: N/A  . Number of children: N/A  . Years of education: N/A   Social History Main Topics  . Smoking  status: Never Smoker  . Smokeless tobacco: Never Used  . Alcohol use 8.4 oz/week    14 Standard drinks or equivalent per week  . Drug use: No  . Sexual activity: Not Asked   Other Topics Concern  . None   Social History Narrative  . None   Additional Social History:    Allergies:  No Known Allergies  Labs:  Results for orders placed or performed during the hospital encounter of 12/11/16 (from the past 48 hour(s))  Rapid urine drug screen (hospital performed)     Status: None   Collection Time: 12/11/16 12:10 PM  Result Value Ref Range   Opiates NONE DETECTED NONE DETECTED   Cocaine NONE DETECTED NONE DETECTED   Benzodiazepines NONE DETECTED NONE DETECTED   Amphetamines NONE DETECTED NONE DETECTED   Tetrahydrocannabinol NONE DETECTED NONE DETECTED   Barbiturates NONE DETECTED NONE DETECTED    Comment:        DRUG SCREEN FOR MEDICAL PURPOSES ONLY.  IF CONFIRMATION IS NEEDED FOR ANY PURPOSE, NOTIFY LAB WITHIN 5 DAYS.        LOWEST DETECTABLE LIMITS FOR URINE DRUG SCREEN Drug Class       Cutoff (ng/mL) Amphetamine      1000 Barbiturate      200 Benzodiazepine   476 Tricyclics       546 Opiates          300 Cocaine          300 THC              50  Comprehensive metabolic panel     Status: Abnormal   Collection Time: 12/11/16 12:14 PM  Result Value Ref Range   Sodium 141 135 - 145 mmol/L   Potassium 3.3 (L) 3.5 - 5.1 mmol/L   Chloride 100 (L) 101 - 111 mmol/L   CO2 23 22 - 32 mmol/L   Glucose, Bld 144 (H) 65 - 99 mg/dL   BUN <5 (L) 6 - 20 mg/dL   Creatinine, Ser 0.84 0.61 - 1.24 mg/dL   Calcium 8.9 8.9 - 10.3 mg/dL   Total Protein 8.5 (H) 6.5 - 8.1 g/dL   Albumin 5.3 (H) 3.5 - 5.0 g/dL   AST 113 (H) 15 - 41 U/L   ALT 56 17 - 63 U/L   Alkaline Phosphatase 66 38 - 126 U/L   Total Bilirubin 0.8 0.3 - 1.2 mg/dL   GFR calc non Af Amer >60 >60 mL/min   GFR calc Af Amer >60 >60 mL/min    Comment: (NOTE) The eGFR has been calculated using the CKD EPI  equation. This calculation has not been validated in all clinical situations. eGFR's persistently <60 mL/min signify possible Chronic Kidney Disease.    Anion gap 18 (H) 5 - 15  cbc     Status: Abnormal   Collection Time: 12/11/16 12:14 PM  Result Value Ref Range   WBC 5.2 4.0 - 10.5 K/uL   RBC 3.94 (L) 4.22 - 5.81 MIL/uL   Hemoglobin 13.4 13.0 - 17.0 g/dL   HCT 38.8 (L) 39.0 - 52.0 %   MCV 98.5 78.0 - 100.0 fL   MCH 34.0 26.0 - 34.0 pg   MCHC 34.5 30.0 - 36.0 g/dL   RDW 14.7 11.5 - 15.5 %   Platelets 221 150 - 400 K/uL  Ethanol     Status: Abnormal   Collection Time: 12/11/16 12:15 PM  Result Value Ref Range   Alcohol, Ethyl (B) 493 (HH) <5 mg/dL    Comment:        LOWEST DETECTABLE LIMIT FOR SERUM ALCOHOL IS 5 mg/dL FOR MEDICAL PURPOSES ONLY CRITICAL RESULT CALLED TO, READ BACK BY AND VERIFIED WITH: TAI,M @ 1321 ON [id number] BY POTEAT,S   Ethanol     Status: Abnormal   Collection Time: 12/11/16  8:11 PM  Result Value Ref Range   Alcohol, Ethyl (B) 307 (HH) <5 mg/dL    Comment:        LOWEST DETECTABLE LIMIT FOR SERUM ALCOHOL IS 5 mg/dL FOR MEDICAL PURPOSES ONLY CRITICAL RESULT CALLED TO, READ BACK BY AND VERIFIED WITH: Angie Fava RN 2055 12/11/16 A NAVARRO   Ethanol     Status: Abnormal   Collection Time: 12/12/16 12:38 AM  Result Value Ref Range   Alcohol, Ethyl (B) 209 (H) <5 mg/dL    Comment:        LOWEST DETECTABLE LIMIT FOR SERUM ALCOHOL IS 5 mg/dL FOR MEDICAL PURPOSES ONLY     Current Facility-Administered Medications  Medication Dose Route Frequency Provider Last Rate Last Dose  . folic acid (FOLVITE) tablet 1 mg  1 mg Oral Daily Robbie Lis, MD      . labetalol (NORMODYNE) tablet 100 mg  100 mg Oral BID Robbie Lis, MD      . LORazepam (ATIVAN) tablet 1 mg  1 mg Oral Q6H PRN Robbie Lis, MD       Or  . LORazepam (ATIVAN) injection 1 mg  1 mg Intravenous Q6H PRN Robbie Lis, MD      .  multivitamin with minerals tablet 1 tablet  1 tablet Oral  Daily Robbie Lis, MD      . thiamine (VITAMIN B-1) tablet 100 mg  100 mg Oral Daily Robbie Lis, MD       Or  . thiamine (B-1) injection 100 mg  100 mg Intravenous Daily Robbie Lis, MD       No current outpatient prescriptions on file.    Musculoskeletal: Strength & Muscle Tone: within normal limits Gait & Station: unsteady Patient leans: N/A  Psychiatric Specialty Exam: Physical Exam  ROS ongoing tremulousness, no hallucinosis or delirium  Blood pressure (!) 156/99, pulse 100, temperature 98 F (36.7 C), temperature source Oral, resp. rate 20, SpO2 99 %.There is no height or weight on file to calculate BMI.  General Appearance: Casual and Fairly Groomed  Eye Contact:  Fair  Speech:  Clear and Coherent  Volume:  Normal  Mood:  Dysphoric  Affect:  Depressed  Thought Process:  Goal Directed  Orientation:  Full (Time, Place, and Person)  Thought Content:  Logical  Suicidal Thoughts:  No  Homicidal Thoughts:  No  Memory:  Immediate;   Fair  Judgement:  Fair  Insight:  Fair  Psychomotor Activity:  Increased and Tremor  Concentration:  Concentration: Fair  Recall:  NA  Fund of Knowledge:  Fair  Language:  Fair  Akathisia:  Negative  Handed:  Right  AIMS (if indicated):     Assets:  Communication Skills Desire for Improvement  ADL's:  Intact  Cognition:  WNL  Sleep:      Treatment Plan Summary: Plan admit to medical floor for alcohol detox  No sitter indicated at this time, as he is voluntary and denies any plan/intent to harm himself  Disposition: patient should be reassed when medically cleared, to assess for need for psychiatric admission  Aundra Dubin, MD 12/12/2016 10:58 AM

## 2016-12-12 NOTE — Progress Notes (Signed)
Precedex gtt started. Will continue to monitor CIWA score. Ativan not given at this time.

## 2016-12-12 NOTE — Progress Notes (Signed)
Spoke with Dr. Antionette Char, MD placing orders to start pt on Precedex gtt. MD verified that RN may administer IV Ativan while pt is on Precedex gtt. RN notified MD of pt BP upon arrival to SD unit. MD ordered PRN Hydralazine in addition to scheduled PO Labetolol. Will continue to monitor.

## 2016-12-12 NOTE — H&P (Signed)
History and Physical    Shiva Sahagian WUJ:[id number] DOB: [birth year] DOA: 12/11/2016  Referring MD/NP/PA: Dr. Patria Mane  PCP: Patient, No Pcp Per    Patient coming from: home  Chief Complaint: alcohol intoxication   HPI: Brett Molina is a 26 y.o. male with medical history significant for alcohol abuse, depression. Patient presented to ED for evaluation of depression and alcoholism but per psych eval in ED, pt at risk for withdrawals so not stable to come to [facility]. Patient has no suicidal ideations at this time, no hallucinations. No chest pain, shortness of breath or palpitations. No abdominal pain, nausea or vomiting.   ED Course: In ED, BP was 160/101, otherwise pt was hemodynamically stable. Alcohol level was 493, he was started on CIWA protocol. Potassium was 3.3, supplemented in ED.  Review of Systems:  Constitutional: Negative for fever, chills, diaphoresis, activity change, appetite change and fatigue.  HENT: Negative for ear pain, nosebleeds, congestion, facial swelling, rhinorrhea, neck pain, neck stiffness and ear discharge.   Eyes: Negative for pain, discharge, redness, itching and visual disturbance.  Respiratory: Negative for cough, choking, chest tightness, shortness of breath, wheezing and stridor.   Cardiovascular: Negative for chest pain, palpitations and leg swelling.  Gastrointestinal: Negative for abdominal distention.  Genitourinary: Negative for dysuria, urgency, frequency, hematuria, flank pain, decreased urine volume, difficulty urinating and dyspareunia.  Musculoskeletal: Negative for back pain, joint swelling, arthralgias and gait problem.  Neurological: Negative for dizziness, tremors, seizures, syncope, facial asymmetry, speech difficulty, weakness, light-headedness, numbness and headaches.  Hematological: Negative for adenopathy. Does not bruise/bleed easily.  Psychiatric/Behavioral: Negative for hallucinations, behavioral problems, confusion, dysphoric mood,  decreased concentration and agitation.   History reviewed. No pertinent past medical history.  History reviewed. No pertinent surgical history.  Social history:  reports that he has never smoked. He has never used smokeless tobacco. He reports that he drinks about 8.4 oz of alcohol per week . He reports that he does not use drugs.    Ambulation: ambulates without assistance.  No Known Allergies  Family history: mother has hypertension   Prior to Admission medications   Not on File    Physical Exam: Vitals:   12/11/16 1156 12/11/16 2140 12/12/16 0223 12/12/16 0445  BP: (!) 152/86 (!) 160/101 (!) 154/98 (!) 156/99  Pulse: (!) 105 (!) 106 98 100  Resp: 16   20  Temp: 98.1 F (36.7 C)   98 F (36.7 C)  TempSrc: Oral   Oral  SpO2: 95%   99%    Constitutional: NAD, calm, comfortable Vitals:   12/11/16 1156 12/11/16 2140 12/12/16 0223 12/12/16 0445  BP: (!) 152/86 (!) 160/101 (!) 154/98 (!) 156/99  Pulse: (!) 105 (!) 106 98 100  Resp: 16   20  Temp: 98.1 F (36.7 C)   98 F (36.7 C)  TempSrc: Oral   Oral  SpO2: 95%   99%   Eyes: PERRL, lids and conjunctivae normal ENMT: No tonsillar erythema or exudates, mucous membranes   Neck: normal, supple, no masses, no thyromegaly Respiratory: clear to auscultation bilaterally, no wheezing, no crackles. Normal respiratory effort. No accessory muscle use.  Cardiovascular: Regular rate and rhythm, no murmurs / rubs / gallops. No extremity edema. 2+ pedal pulses. No carotid bruits.  Abdomen: no tenderness, no masses palpated. No hepatosplenomegaly. Bowel sounds positive.  Musculoskeletal: no clubbing / cyanosis. No joint deformity upper and lower extremities. Good ROM, no contractures. Normal muscle tone.  Skin: no rashes, lesions, ulcers. No induration Neurologic: CN 2-12  grossly intact. Sensation intact, DTR normal. Strength 5/5 in all 4.  Psychiatric: No agitation, no restlessness   Labs on Admission: I have personally reviewed  following labs and imaging studies  CBC:  Recent Labs Lab 12/11/16 1214  WBC 5.2  HGB 13.4  HCT 38.8*  MCV 98.5  PLT 221   Basic Metabolic Panel:  Recent Labs Lab 12/11/16 1214  NA 141  K 3.3*  CL 100*  CO2 23  GLUCOSE 144*  BUN <5*  CREATININE 0.84  CALCIUM 8.9   GFR: CrCl cannot be calculated (Unknown ideal weight.). Liver Function Tests:  Recent Labs Lab 12/11/16 1214  AST 113*  ALT 56  ALKPHOS 66  BILITOT 0.8  PROT 8.5*  ALBUMIN 5.3*   No results for input(s): LIPASE, AMYLASE in the last 168 hours. No results for input(s): AMMONIA in the last 168 hours. Coagulation Profile: No results for input(s): INR, PROTIME in the last 168 hours. Cardiac Enzymes: No results for input(s): CKTOTAL, CKMB, CKMBINDEX, TROPONINI in the last 168 hours. BNP (last 3 results) No results for input(s): PROBNP in the last 8760 hours. HbA1C: No results for input(s): HGBA1C in the last 72 hours. CBG: No results for input(s): GLUCAP in the last 168 hours. Lipid Profile: No results for input(s): CHOL, HDL, LDLCALC, TRIG, CHOLHDL, LDLDIRECT in the last 72 hours. Thyroid Function Tests: No results for input(s): TSH, T4TOTAL, FREET4, T3FREE, THYROIDAB in the last 72 hours. Anemia Panel: No results for input(s): VITAMINB12, FOLATE, FERRITIN, TIBC, IRON, RETICCTPCT in the last 72 hours. Urine analysis: No results found for: COLORURINE, APPEARANCEUR, LABSPEC, PHURINE, GLUCOSEU, HGBUR, BILIRUBINUR, KETONESUR, PROTEINUR, UROBILINOGEN, NITRITE, LEUKOCYTESUR Sepsis Labs: @LABRCNTIP (procalcitonin:4,lacticidven:4) )No results found for this or any previous visit (from the past 240 hour(s)).   Radiological Exams on Admission: No results found.  EKG: pending   Assessment/Plan Principal Problem:   Alcoholic intoxication without complication (HCC) - CIWA protocol started - Alcohol level 493 in ED - Continue IV fluids, multivitamin, thiamine and folic acid - Monitor for withdrawals     Active Problems:   Hypokalemia - Due to alcohol abuse - Supplemented - Follow up BMP in am    Alcoholic hepatitis without ascites - Elevated AST 113 (ALT WNL 56    Essential hypertension - Added labetalol for better BP control     Depression - Will consult psych for evaluation     DVT prophylaxis: SCD's  Code Status: full code Family Communication: no family at the bedside Disposition Plan: telemetry  Consults called: psych Admission status: inpatient, admission for alcohol intoxication, pt at risk for clinical deterioration including delirium.   Manson Passey MD [facility] Pager [phone]  If 7PM-7AM, please contact night-coverage [email or web address] Password [email or web address]  12/12/2016, 10:29 AM

## 2016-12-13 DIAGNOSIS — F1010 Alcohol abuse, uncomplicated: Secondary | ICD-10-CM

## 2016-12-13 DIAGNOSIS — F10920 Alcohol use, unspecified with intoxication, uncomplicated: Secondary | ICD-10-CM

## 2016-12-13 DIAGNOSIS — F329 Major depressive disorder, single episode, unspecified: Secondary | ICD-10-CM

## 2016-12-13 LAB — BASIC METABOLIC PANEL
Anion gap: 15 (ref 5–15)
BUN: 7 mg/dL (ref 6–20)
CHLORIDE: 99 mmol/L — AB (ref 101–111)
CO2: 23 mmol/L (ref 22–32)
CREATININE: 0.76 mg/dL (ref 0.61–1.24)
Calcium: 8.8 mg/dL — ABNORMAL LOW (ref 8.9–10.3)
GFR calc Af Amer: >60 mL/min (ref >60)
GFR calc non Af Amer: >60 mL/min (ref >60)
Glucose, Bld: 117 mg/dL — ABNORMAL HIGH (ref 65–99)
POTASSIUM: 3.3 mmol/L — AB (ref 3.5–5.1)
Sodium: 137 mmol/L (ref 135–145)

## 2016-12-13 LAB — CBC
HEMATOCRIT: 35.3 % — AB (ref 39.0–52.0)
Hemoglobin: 12.1 g/dL — ABNORMAL LOW (ref 13.0–17.0)
MCH: 33.8 pg (ref 26.0–34.0)
MCHC: 34.3 g/dL (ref 30.0–36.0)
MCV: 98.6 fL (ref 78.0–100.0)
PLATELETS: 166 10*3/uL (ref 150–400)
RBC: 3.58 MIL/uL — AB (ref 4.22–5.81)
RDW: 14.1 % (ref 11.5–15.5)
WBC: 5.4 10*3/uL (ref 4.0–10.5)

## 2016-12-13 LAB — HIV ANTIBODY (ROUTINE TESTING W REFLEX): HIV Screen 4th Generation wRfx: NONREACTIVE

## 2016-12-13 LAB — GLUCOSE, CAPILLARY: GLUCOSE-CAPILLARY: 115 mg/dL — AB (ref 65–99)

## 2016-12-13 MED ORDER — CLONIDINE HCL 0.1 MG PO TABS
0.1000 mg | ORAL_TABLET | Freq: Two times a day (BID) | ORAL | Status: DC
  Administered 2016-12-13 (×2): 0.1 mg via ORAL
  Filled 2016-12-13 (×3): qty 1

## 2016-12-13 MED ORDER — POTASSIUM CHLORIDE CRYS ER 20 MEQ PO TBCR
40.0000 meq | EXTENDED_RELEASE_TABLET | Freq: Two times a day (BID) | ORAL | Status: AC
  Administered 2016-12-13 (×2): 40 meq via ORAL
  Filled 2016-12-13 (×2): qty 2

## 2016-12-13 MED ORDER — DIAZEPAM 2 MG PO TABS
2.0000 mg | ORAL_TABLET | Freq: Three times a day (TID) | ORAL | Status: DC
  Administered 2016-12-13 – 2016-12-21 (×21): 2 mg via ORAL
  Filled 2016-12-13 (×21): qty 1

## 2016-12-13 NOTE — Progress Notes (Signed)
Patient asked about his cell phone. He is unable to recall where he last had it, but it is not with his belongings. He is unsure if he had it while in the hospital- he states his memory is unclear for the last few days. Pt also requested I attempt to contact his brother, Leelynn Otero, to let him know that he is in the hospital. Will attempt to locate his brother. Bosie Helper, RN

## 2016-12-13 NOTE — Progress Notes (Addendum)
PROGRESS NOTE    Brett Molina  QMV:[id number] DOB: 10/23/1990 DOA: 12/11/2016 PCP: Patient, No Pcp Per    Brief Narrative: Brett Molina is a 26 y.o. male with medical history significant for alcohol abuse, depression  Was found to be intoxicated, and had tremors and shaking, disoriented . He was admitted by hospitalist to  Step down and earlier this am, he went in to withdrawals and was started on precedex drip.   Assessment & Plan:   Principal Problem:   Alcoholic intoxication without complication (HCC) Active Problems:   Hypokalemia   Alcoholic hepatitis without ascites   MDD (major depressive disorder)   Alcohol abuse, with delirium and tremors:  Currently on precedex drip up to 72ml/hr. On CIWA for withdrawals.  IV fluids and IV labetalol for hypertensive urgency.    Hypokalemia: repleted.   Depression: psychiatry consulted. Recommended to re consult them after pt is medically stable.   Hypertensive urgency:  On nitro gtt, labetalol and clonidine. b p parameters much better.     DVT prophylaxis: scd's  Code Status: full code.  Family Communication: family at bedside.  Disposition Plan: transfer pt to ICU to PCCM service.    Consultants:   Requested PCCM as pt is on prece dex drip.    Procedures: none.    Antimicrobials: none.    Subjective: Did not have any complaints.   Objective: Vitals:   12/13/16 0700 12/13/16 0800 12/13/16 0900 12/13/16 1000  BP: (!) 159/96 (!) 174/83 (!) 156/96 (!) 161/94  Pulse: 99 90 95 88  Resp: 17 16 16 17   Temp:  98.2 F (36.8 C)    TempSrc:  Oral    SpO2: 98% 99% 97% 99%  Weight:      Height:        Intake/Output Summary (Last 24 hours) at 12/13/16 1149 Last data filed at 12/13/16 0800  Gross per 24 hour  Intake          1817.73 ml  Output              750 ml  Net          1067.73 ml   Filed Weights   12/12/16 2135 12/13/16 0235  Weight: 104.8 kg (231 lb 0.7 oz) 105.1 kg (231 lb 11.3 oz)     Examination:  General exam: Appears anxious, face swollen.  Respiratory system: diminished at bases, no wheezing or rhonchi.  Cardiovascular system: S1 & S2 heard,tachycardic, no murmers  No pedal edema. Gastrointestinal system: Abdomen is nondistended, soft and nontender. No organomegaly or masses felt. Normal bowel sounds heard. Central nervous system: disoriented, confused, able to move all extremities. Speech is slightly slurred, Extremities: able to move extremities , no pedal edema.  Skin: No rashes, lesions or ulcers Psychiatry: anxious.     Data Reviewed: I have personally reviewed following labs and imaging studies  CBC:  Recent Labs Lab 12/11/16 1214 12/13/16 0328  WBC 5.2 5.4  HGB 13.4 12.1*  HCT 38.8* 35.3*  MCV 98.5 98.6  PLT 221 166   Basic Metabolic Panel:  Recent Labs Lab 12/11/16 1214 12/13/16 0328  NA 141 137  K 3.3* 3.3*  CL 100* 99*  CO2 23 23  GLUCOSE 144* 117*  BUN <5* 7  CREATININE 0.84 0.76  CALCIUM 8.9 8.8*   GFR: Estimated Creatinine Clearance: 177.7 mL/min (by C-G formula based on SCr of 0.76 mg/dL). Liver Function Tests:  Recent Labs Lab 12/11/16 1214  AST 113*  ALT 56  ALKPHOS 66  BILITOT 0.8  PROT 8.5*  ALBUMIN 5.3*   No results for input(s): LIPASE, AMYLASE in the last 168 hours. No results for input(s): AMMONIA in the last 168 hours. Coagulation Profile: No results for input(s): INR, PROTIME in the last 168 hours. Cardiac Enzymes: No results for input(s): CKTOTAL, CKMB, CKMBINDEX, TROPONINI in the last 168 hours. BNP (last 3 results) No results for input(s): PROBNP in the last 8760 hours. HbA1C: No results for input(s): HGBA1C in the last 72 hours. CBG:  Recent Labs Lab 12/13/16 0919  GLUCAP 115*   Lipid Profile: No results for input(s): CHOL, HDL, LDLCALC, TRIG, CHOLHDL, LDLDIRECT in the last 72 hours. Thyroid Function Tests: No results for input(s): TSH, T4TOTAL, FREET4, T3FREE, THYROIDAB in the last  72 hours. Anemia Panel: No results for input(s): VITAMINB12, FOLATE, FERRITIN, TIBC, IRON, RETICCTPCT in the last 72 hours. Sepsis Labs: No results for input(s): PROCALCITON, LATICACIDVEN in the last 168 hours.  Recent Results (from the past 240 hour(s))  MRSA PCR Screening     Status: None   Collection Time: 12/12/16  9:52 PM  Result Value Ref Range Status   MRSA by PCR NEGATIVE NEGATIVE Final    Comment:        The GeneXpert MRSA Assay (FDA approved for NASAL specimens only), is one component of a comprehensive MRSA colonization surveillance program. It is not intended to diagnose MRSA infection nor to guide or monitor treatment for MRSA infections.          Radiology Studies: No results found.      Scheduled Meds: . folic acid  1 mg Oral Daily  . labetalol  200 mg Oral BID  . multivitamin with minerals  1 tablet Oral Daily  . potassium chloride  40 mEq Oral BID  . sodium chloride flush  3 mL Intravenous Q12H  . thiamine  100 mg Oral Daily   Or  . thiamine  100 mg Intravenous Daily   Continuous Infusions: . sodium chloride 75 mL/hr at 12/13/16 1045  . dexmedetomidine (PRECEDEX) IV infusion 0.7 mcg/kg/hr (12/13/16 0810)  . nitroGLYCERIN 105 mcg/min (12/13/16 1045)     LOS: 1 day    Time spent:30 minutes.     Kathlen Mody, MD [facility] Pager [phone]  If 7PM-7AM, please contact night-coverage [email or web address] Password [email or web address] 12/13/2016, 11:49 AM

## 2016-12-13 NOTE — Progress Notes (Signed)
Pt transferred to SD d/t excessive tremors, and unstable VS.

## 2016-12-13 NOTE — Progress Notes (Signed)
Dr. Sandford Craze with critical care at bedside. RN informed MD of current gtts running and orders received from [facility], Dr. Antionette Char. Dr. Sandford Craze ordered RN to give pt 2mg  IV Ativan at this time. Will continue to monitor.

## 2016-12-13 NOTE — Consult Note (Signed)
PULMONARY / CRITICAL CARE MEDICINE   Name: Brett Molina MRN: [id number] DOB: 1991/06/03    ADMISSION DATE:  12/11/2016 CONSULTATION DATE:  12/13/16  REFERRING MD:  Thurman Coyer MD  CHIEF COMPLAINT:  Alcohol withdrawal requiring precedex  HISTORY OF PRESENT ILLNESS:   26 year old with history of alcohol abuse, depression. Admitted with alcohol intoxication, mild ketosis. Went into full blown withdrawal 6/9 and was started on precedex drip, nitro drip for elevated BP PCCM consulted for help with management.  PAST MEDICAL HISTORY :  He  has no past medical history on file.  PAST SURGICAL HISTORY: He  has no past surgical history on file.  No Known Allergies  No current facility-administered medications on file prior to encounter.    No current outpatient prescriptions on file prior to encounter.    FAMILY HISTORY:  His has no family status information on file.    SOCIAL HISTORY: He  reports that he has never smoked. He has never used smokeless tobacco. He reports that he drinks about 8.4 oz of alcohol per week . He reports that he does not use drugs.  REVIEW OF SYSTEMS:   Unable to obtain as patient is obtunded  SUBJECTIVE:    VITAL SIGNS: BP (!) 153/95   Pulse 92   Temp 98.6 F (37 C) (Oral)   Resp 20   Ht 6\' 2"  (1.88 m)   Wt 231 lb 11.3 oz (105.1 kg)   SpO2 98%   BMI 29.75 kg/m   HEMODYNAMICS:    VENTILATOR SETTINGS:    INTAKE / OUTPUT: I/O last 3 completed shifts: In: 3572.1 [P.O.:50; I.V.:2522.1; IV Piggyback:1000] Out: 750 [Urine:750]  PHYSICAL EXAMINATION: Gen:      No acute distress HEENT:  EOMI, sclera anicteric Neck:     No masses; no thyromegaly Lungs:    Clear to auscultation bilaterally; normal respiratory effort CV:         Regular rate and rhythm; no murmurs Abd:      + bowel sounds; soft, non-tender; no palpable masses, no distension Ext:    No edema; adequate peripheral perfusion Skin:      Warm and dry; no rash Neuro: sedated,  arousable  LABS:  BMET  Recent Labs Lab 12/11/16 1214 12/13/16 0328  NA 141 137  K 3.3* 3.3*  CL 100* 99*  CO2 23 23  BUN <5* 7  CREATININE 0.84 0.76  GLUCOSE 144* 117*    Electrolytes  Recent Labs Lab 12/11/16 1214 12/13/16 0328  CALCIUM 8.9 8.8*    CBC  Recent Labs Lab 12/11/16 1214 12/13/16 0328  WBC 5.2 5.4  HGB 13.4 12.1*  HCT 38.8* 35.3*  PLT 221 166    Coag's No results for input(s): APTT, INR in the last 168 hours.  Sepsis Markers No results for input(s): LATICACIDVEN, PROCALCITON, O2SATVEN in the last 168 hours.  ABG No results for input(s): PHART, PCO2ART, PO2ART in the last 168 hours.  Liver Enzymes  Recent Labs Lab 12/11/16 1214  AST 113*  ALT 56  ALKPHOS 66  BILITOT 0.8  ALBUMIN 5.3*    Cardiac Enzymes No results for input(s): TROPONINI, PROBNP in the last 168 hours.  Glucose  Recent Labs Lab 12/13/16 0919  GLUCAP 115*    Imaging No results found.   STUDIES:    CULTURES:   ANTIBIOTICS:   SIGNIFICANT EVENTS:   LINES/TUBES:  DISCUSSION: 26 year old with history of alcohol abuse, no significant past medical history Admitted with DTs requiring ICU admission and Precedex.  Alcohol withdrawal Continue precedex Ativan PRN Start standing valium Thiamine, folate  Mild elevation in AST. Likely from alcohol use. Recheck LFTs  Hypertension Continue nitro drip On labetalol standing and hydralazine PRN  The patient is critically ill with multiple organ system failure and requires high complexity decision making for assessment and support, frequent evaluation and titration of therapies, advanced monitoring, review of radiographic studies and interpretation of complex data.   Critical Care Time devoted to patient care services, exclusive of separately billable procedures, described in this note is 35 minutes.   Chilton Greathouse MD [facility] Pager [phone] If no answer or after  3pm call: [phone] 12/13/2016, 1:33 PM

## 2016-12-14 ENCOUNTER — Inpatient Hospital Stay (HOSPITAL_COMMUNITY): Payer: Managed Care, Other (non HMO)

## 2016-12-14 LAB — COMPREHENSIVE METABOLIC PANEL
ALT: 38 U/L (ref 17–63)
ANION GAP: 12 (ref 5–15)
AST: 66 U/L — ABNORMAL HIGH (ref 15–41)
Albumin: 4.3 g/dL (ref 3.5–5.0)
Alkaline Phosphatase: 49 U/L (ref 38–126)
BUN: 7 mg/dL (ref 6–20)
CHLORIDE: 104 mmol/L (ref 101–111)
CO2: 23 mmol/L (ref 22–32)
Calcium: 8.9 mg/dL (ref 8.9–10.3)
Creatinine, Ser: 0.74 mg/dL (ref 0.61–1.24)
Glucose, Bld: 95 mg/dL (ref 65–99)
POTASSIUM: 3.3 mmol/L — AB (ref 3.5–5.1)
SODIUM: 139 mmol/L (ref 135–145)
Total Bilirubin: 1.3 mg/dL — ABNORMAL HIGH (ref 0.3–1.2)
Total Protein: 7.2 g/dL (ref 6.5–8.1)

## 2016-12-14 LAB — PHOSPHORUS: PHOSPHORUS: 3.5 mg/dL (ref 2.5–4.6)

## 2016-12-14 LAB — CBC
HEMATOCRIT: 34.4 % — AB (ref 39.0–52.0)
Hemoglobin: 12 g/dL — ABNORMAL LOW (ref 13.0–17.0)
MCH: 33.8 pg (ref 26.0–34.0)
MCHC: 34.9 g/dL (ref 30.0–36.0)
MCV: 96.9 fL (ref 78.0–100.0)
PLATELETS: 160 10*3/uL (ref 150–400)
RBC: 3.55 MIL/uL — ABNORMAL LOW (ref 4.22–5.81)
RDW: 13.6 % (ref 11.5–15.5)
WBC: 7.5 10*3/uL (ref 4.0–10.5)

## 2016-12-14 LAB — GLUCOSE, CAPILLARY: GLUCOSE-CAPILLARY: 99 mg/dL (ref 65–99)

## 2016-12-14 LAB — TROPONIN I

## 2016-12-14 LAB — AMMONIA: AMMONIA: 17 umol/L (ref 9–35)

## 2016-12-14 LAB — MAGNESIUM: MAGNESIUM: 1.5 mg/dL — AB (ref 1.7–2.4)

## 2016-12-14 MED ORDER — SODIUM CHLORIDE 0.9 % IV SOLN
0.2000 ug/kg/h | INTRAVENOUS | Status: DC
  Filled 2016-12-14: qty 2

## 2016-12-14 MED ORDER — POTASSIUM CHLORIDE CRYS ER 20 MEQ PO TBCR
40.0000 meq | EXTENDED_RELEASE_TABLET | Freq: Two times a day (BID) | ORAL | Status: AC
  Administered 2016-12-14 (×2): 40 meq via ORAL
  Filled 2016-12-14 (×2): qty 2

## 2016-12-14 MED ORDER — MAGNESIUM SULFATE 2 GM/50ML IV SOLN
2.0000 g | Freq: Once | INTRAVENOUS | Status: AC
  Administered 2016-12-14: 2 g via INTRAVENOUS
  Filled 2016-12-14: qty 50

## 2016-12-14 MED ORDER — DEXMEDETOMIDINE HCL IN NACL 200 MCG/50ML IV SOLN
0.2000 ug/kg/h | INTRAVENOUS | Status: DC
  Administered 2016-12-15 (×2): 0.7 ug/kg/h via INTRAVENOUS
  Administered 2016-12-15 (×3): 1 ug/kg/h via INTRAVENOUS
  Administered 2016-12-15 (×2): 0.7 ug/kg/h via INTRAVENOUS
  Administered 2016-12-15: 0.9 ug/kg/h via INTRAVENOUS
  Administered 2016-12-15: 1 ug/kg/h via INTRAVENOUS
  Filled 2016-12-14 (×10): qty 50

## 2016-12-14 MED ORDER — CLONIDINE HCL 0.1 MG PO TABS
0.2000 mg | ORAL_TABLET | Freq: Two times a day (BID) | ORAL | Status: DC
  Administered 2016-12-14 – 2016-12-20 (×13): 0.2 mg via ORAL
  Filled 2016-12-14 (×13): qty 2

## 2016-12-14 MED ORDER — LORAZEPAM 2 MG/ML IJ SOLN
INTRAMUSCULAR | Status: AC
  Administered 2016-12-14: 4 mg
  Filled 2016-12-14: qty 2

## 2016-12-14 NOTE — Progress Notes (Signed)
Fall pads in place- ICU bed in lowest position- Seizure pads in place. Patient resting in bed at this time.

## 2016-12-14 NOTE — Progress Notes (Signed)
Patient woke during report with new confusion and hallucinations. Pt disoriented to all but self and stating that his "family is in the next room waiting for me". Patient reoriented and situation about hospitalization explained. Ativan given based on CIWA of 19. Will continue to monitor closely.

## 2016-12-14 NOTE — Progress Notes (Signed)
Patient is disoriented to place, situation, and time. Patient is hallucinating and repeatedly climbing out of bed. Severe visible tremors of arms and legs, pt unable to hold his own weight. Patient has been reoriented with staff unable to leave the room. Seizure pads are in place. Bed alarm on most sensitive setting and ICU bed in lowest position. Will continue to attempt to reorient and redirect patient to maintain safety and health. Bosie Helper, RN

## 2016-12-14 NOTE — Progress Notes (Signed)
PULMONARY / CRITICAL CARE MEDICINE   Name: Brett Molina MRN: [id number] DOB: 1991/03/21    ADMISSION DATE:  12/11/2016 CONSULTATION DATE:  12/13/16  REFERRING MD:  Thurman Coyer MD  CHIEF COMPLAINT:  Alcohol withdrawal requiring precedex  BRIEF SUMMARY:   26 year old with history of alcohol abuse, depression. Admitted with alcohol intoxication, mild ketosis. Went into full blown withdrawal 6/9 and was started on precedex drip, nitro drip for elevated BP PCCM consulted for help with management.  SUBJECTIVE:  RN reports pt remains on precedex at 1.8, continues to be intermittently agitated with hallucinations   VITAL SIGNS: BP (!) 181/105   Pulse 98   Temp 98.8 F (37.1 C) (Oral)   Resp (!) 23   Ht 6\' 2"  (1.88 m)   Wt 232 lb 9.4 oz (105.5 kg)   SpO2 97%   BMI 29.86 kg/m   HEMODYNAMICS:    VENTILATOR SETTINGS:    INTAKE / OUTPUT: I/O last 3 completed shifts: In: 4680.7 [P.O.:150; I.V.:4530.7] Out: 3850 [Urine:3850]  PHYSICAL EXAMINATION: General: young adult male in NAD, lying in bed HEENT: MM pink/dry, no jvd PSY: picking / unable to sit still  Neuro: Awake/alert, MAE, speech clear, oriented to self, picking/ easily reoriented  CV: s1s2 rrr, no m/r/g PULM: even/non-labored, lungs bilaterally clear  WU:JWJX, non-tender, bsx4 active  Extremities: warm/dry, no edema  Skin: no rashes or lesions   LABS:  BMET  Recent Labs Lab 12/11/16 1214 12/13/16 0328 12/14/16 0315  NA 141 137 139  K 3.3* 3.3* 3.3*  CL 100* 99* 104  CO2 23 23 23   BUN <5* 7 7  CREATININE 0.84 0.76 0.74  GLUCOSE 144* 117* 95    Electrolytes  Recent Labs Lab 12/11/16 1214 12/13/16 0328 12/14/16 0315  CALCIUM 8.9 8.8* 8.9  MG  --   --  1.5*  PHOS  --   --  3.5    CBC  Recent Labs Lab 12/11/16 1214 12/13/16 0328 12/14/16 0315  WBC 5.2 5.4 7.5  HGB 13.4 12.1* 12.0*  HCT 38.8* 35.3* 34.4*  PLT 221 166 160    Coag's No results for input(s): APTT, INR in the last 168  hours.  Sepsis Markers No results for input(s): LATICACIDVEN, PROCALCITON, O2SATVEN in the last 168 hours.  ABG No results for input(s): PHART, PCO2ART, PO2ART in the last 168 hours.  Liver Enzymes  Recent Labs Lab 12/11/16 1214 12/14/16 0315  AST 113* 66*  ALT 56 38  ALKPHOS 66 49  BILITOT 0.8 1.3*  ALBUMIN 5.3* 4.3    Cardiac Enzymes  Recent Labs Lab 12/14/16 0315  TROPONINI <0.03    Glucose  Recent Labs Lab 12/13/16 0919 12/14/16 0739  GLUCAP 115* 99    Imaging Dg Chest Port 1 View  Result Date: 12/14/2016 CLINICAL DATA:  Acute onset of respiratory failure. Initial encounter. EXAM: PORTABLE CHEST 1 VIEW COMPARISON:  None. FINDINGS: The lungs are well-aerated and clear. There is no evidence of focal opacification, pleural effusion or pneumothorax. The left costophrenic angle is incompletely imaged. The cardiomediastinal silhouette is borderline normal in size. No acute osseous abnormalities are seen. IMPRESSION: No acute cardiopulmonary process seen. Electronically Signed   By: Roanna Raider M.D.   On: 12/14/2016 05:43     STUDIES:    CULTURES:   ANTIBIOTICS:   SIGNIFICANT EVENTS: 6/08  Admit with ETOH withdrawal 6/10  Tx to ICU for precedex   LINES/TUBES:  DISCUSSION: 26 year old with history of alcohol abuse, no significant past medical history.  Admitted with DTs requiring ICU admission and Precedex.  Alcohol Withdrawal Agitated Delirium / Hallucinations P: Continue precedex gtt  PRN ativan  Scheduled valium 2mg  Q8  Thiamine, folate, MVI  Mild elevation in AST. Likely from alcohol use. P: Trend LFT's  Diet as tolerated   Hypertension P: Wean NTG gtt to off  Increase clonidine to 0.2 mg BID Labetalol 200 mg BID  PRN hydralazine for SBP >170 ICU monitoring   Hypokalemia  P: Trend BMP / urinary output Replace electrolytes as indicated, KCL + Mg 6/11 Avoid nephrotoxic agents, ensure adequate renal perfusion     Canary Brim, NP-C [facility] Pgr: [phone] or if no answer [phone] 12/14/2016, 8:31 AM

## 2016-12-14 NOTE — Progress Notes (Signed)
Patient transitioned to low bed with fall mats in place due to continued confusion, hallucination, severe tremors, and multiple attempts to get out of bed.

## 2016-12-15 ENCOUNTER — Inpatient Hospital Stay (HOSPITAL_COMMUNITY): Payer: Managed Care, Other (non HMO)

## 2016-12-15 LAB — CBC
HCT: 36.4 % — ABNORMAL LOW (ref 39.0–52.0)
Hemoglobin: 12.3 g/dL — ABNORMAL LOW (ref 13.0–17.0)
MCH: 34 pg (ref 26.0–34.0)
MCHC: 33.8 g/dL (ref 30.0–36.0)
MCV: 100.6 fL — AB (ref 78.0–100.0)
PLATELETS: 172 10*3/uL (ref 150–400)
RBC: 3.62 MIL/uL — AB (ref 4.22–5.81)
RDW: 14.7 % (ref 11.5–15.5)
WBC: 6.1 10*3/uL (ref 4.0–10.5)

## 2016-12-15 LAB — BASIC METABOLIC PANEL
Anion gap: 11 (ref 5–15)
BUN: 6 mg/dL (ref 6–20)
CALCIUM: 8.9 mg/dL (ref 8.9–10.3)
CO2: 20 mmol/L — ABNORMAL LOW (ref 22–32)
CREATININE: 0.65 mg/dL (ref 0.61–1.24)
Chloride: 109 mmol/L (ref 101–111)
GFR calc Af Amer: >60 mL/min (ref >60)
GLUCOSE: 102 mg/dL — AB (ref 65–99)
POTASSIUM: 3.7 mmol/L (ref 3.5–5.1)
Sodium: 140 mmol/L (ref 135–145)

## 2016-12-15 LAB — MAGNESIUM: Magnesium: 2 mg/dL (ref 1.7–2.4)

## 2016-12-15 LAB — GLUCOSE, CAPILLARY: Glucose-Capillary: 108 mg/dL — ABNORMAL HIGH (ref 65–99)

## 2016-12-15 LAB — PHOSPHORUS: Phosphorus: 3.5 mg/dL (ref 2.5–4.6)

## 2016-12-15 MED ORDER — FUROSEMIDE 10 MG/ML IJ SOLN
20.0000 mg | Freq: Once | INTRAMUSCULAR | Status: AC
  Administered 2016-12-15: 20 mg via INTRAVENOUS
  Filled 2016-12-15: qty 2

## 2016-12-15 MED ORDER — LABETALOL HCL 300 MG PO TABS
300.0000 mg | ORAL_TABLET | Freq: Two times a day (BID) | ORAL | Status: DC
  Administered 2016-12-15 – 2016-12-22 (×16): 300 mg via ORAL
  Filled 2016-12-15 (×17): qty 1

## 2016-12-15 MED ORDER — DEXMEDETOMIDINE HCL IN NACL 200 MCG/50ML IV SOLN
0.2000 ug/kg/h | INTRAVENOUS | Status: DC
  Administered 2016-12-15: 0.8 ug/kg/h via INTRAVENOUS
  Administered 2016-12-15: 0.9 ug/kg/h via INTRAVENOUS
  Administered 2016-12-16 (×4): 1 ug/kg/h via INTRAVENOUS
  Administered 2016-12-16: 0.8 ug/kg/h via INTRAVENOUS
  Administered 2016-12-16: 1 ug/kg/h via INTRAVENOUS
  Administered 2016-12-16: 0.8 ug/kg/h via INTRAVENOUS
  Administered 2016-12-16: 0.7 ug/kg/h via INTRAVENOUS
  Administered 2016-12-16: 0.9 ug/kg/h via INTRAVENOUS
  Administered 2016-12-16: 0.7 ug/kg/h via INTRAVENOUS
  Administered 2016-12-16 – 2016-12-17 (×3): 1 ug/kg/h via INTRAVENOUS
  Administered 2016-12-17: 0.6 ug/kg/h via INTRAVENOUS
  Administered 2016-12-17: 0.5 ug/kg/h via INTRAVENOUS
  Administered 2016-12-17 (×2): 1 ug/kg/h via INTRAVENOUS
  Administered 2016-12-17: 0.6 ug/kg/h via INTRAVENOUS
  Administered 2016-12-17 (×2): 1 ug/kg/h via INTRAVENOUS
  Administered 2016-12-18 (×2): 0.4 ug/kg/h via INTRAVENOUS
  Filled 2016-12-15 (×29): qty 50

## 2016-12-15 MED ORDER — POTASSIUM CHLORIDE CRYS ER 20 MEQ PO TBCR
40.0000 meq | EXTENDED_RELEASE_TABLET | Freq: Once | ORAL | Status: DC
Start: 2016-12-15 — End: 2016-12-15

## 2016-12-15 MED ORDER — POTASSIUM CHLORIDE CRYS ER 20 MEQ PO TBCR
40.0000 meq | EXTENDED_RELEASE_TABLET | Freq: Two times a day (BID) | ORAL | Status: AC
  Administered 2016-12-15 (×2): 40 meq via ORAL
  Filled 2016-12-15 (×2): qty 2

## 2016-12-15 NOTE — Progress Notes (Signed)
PULMONARY / CRITICAL CARE MEDICINE   Name: Brett Molina MRN: [id number] DOB: [birth year]    ADMISSION DATE:  12/11/2016 CONSULTATION DATE:  12/13/16  REFERRING MD:  Thurman Coyer MD  CHIEF COMPLAINT:  Alcohol withdrawal requiring precedex  BRIEF SUMMARY:   26 year old with history of alcohol abuse, depression. Admitted with alcohol intoxication, mild ketosis. Went into full blown withdrawal 6/9 and was started on precedex drip, nitro drip for elevated BP PCCM consulted for help with management.  SUBJECTIVE:  RN reports pt was disoriented overnight with hallucinations, climbing out of bed, visible tremors.  He was placed in a low bed with floor mats.    VITAL SIGNS: BP (!) 155/104   Pulse 83   Temp (!) 96.8 F (36 C) (Axillary)   Resp 18   Ht 6\' 2"  (1.88 m)   Wt 230 lb (104.3 kg)   SpO2 99%   BMI 29.53 kg/m   HEMODYNAMICS:    VENTILATOR SETTINGS:    INTAKE / OUTPUT: I/O last 3 completed shifts: In: 4950.1 [P.O.:500; I.V.:4400.1; IV Piggyback:50] Out: 3400 [Urine:3400]  PHYSICAL EXAMINATION: General: young adult male in NAD HEENT: MM pink/moist, no jvd  PSY: calm Neuro: awakens, speech clear, MAD, reduced tremor  CV: s1s2 rrr, no m/r/g PULM: even/non-labored, lungs bilaterally clear  DJ:SHFW, non-tender, bsx4 active  Extremities: warm/dry, no edema  Skin: no rashes or lesions   LABS:  BMET  Recent Labs Lab 12/13/16 0328 12/14/16 0315 12/15/16 0410  NA 137 139 140  K 3.3* 3.3* 3.7  CL 99* 104 109  CO2 23 23 20*  BUN 7 7 6   CREATININE 0.76 0.74 0.65  GLUCOSE 117* 95 102*    Electrolytes  Recent Labs Lab 12/13/16 0328 12/14/16 0315 12/15/16 0410  CALCIUM 8.8* 8.9 8.9  MG  --  1.5* 2.0  PHOS  --  3.5 3.5    CBC  Recent Labs Lab 12/13/16 0328 12/14/16 0315 12/15/16 0410  WBC 5.4 7.5 6.1  HGB 12.1* 12.0* 12.3*  HCT 35.3* 34.4* 36.4*  PLT 166 160 172    Coag's No results for input(s): APTT, INR in the last 168 hours.  Sepsis Markers No  results for input(s): LATICACIDVEN, PROCALCITON, O2SATVEN in the last 168 hours.  ABG No results for input(s): PHART, PCO2ART, PO2ART in the last 168 hours.  Liver Enzymes  Recent Labs Lab 12/11/16 1214 12/14/16 0315  AST 113* 66*  ALT 56 38  ALKPHOS 66 49  BILITOT 0.8 1.3*  ALBUMIN 5.3* 4.3    Cardiac Enzymes  Recent Labs Lab 12/14/16 0315  TROPONINI <0.03    Glucose  Recent Labs Lab 12/13/16 0919 12/14/16 0739 12/15/16 0731  GLUCAP 115* 99 108*    Imaging No results found.   STUDIES:    CULTURES:   ANTIBIOTICS:   SIGNIFICANT EVENTS: 6/08  Admit with ETOH withdrawal 6/10  Tx to ICU for precedex  6/11  ? Peak of withdrawal 6/12  Improved agitation  LINES/TUBES:  DISCUSSION: 25 year old with history of alcohol abuse, no significant past medical history.  Admitted with DTs requiring ICU admission and Precedex.  Alcohol Withdrawal Agitated Delirium / Hallucinations - improved 6/12 P: Precedex gtt for withdrawal  PRN ativan  Scheduled Valium 2mg  Q8 Thiamine, folate, MVI  Mild elevation in AST. Likely from alcohol use. P: Trend LFT's > resolved Diet as tolerated   Hypertension Urgency - remains hypertensive despite multiple agents P: Clonidine 0.2mg  BID  Increase Labetalol to 300 mg BID  Goal to wean NTG  gtt to off  PRN hydralazine for SBP > 170 ICU monitoring of hemodynamics Obtain renal US to r/o renal artery stenosis  Assess CT head to ensure no CNS lesions as source of HTN  Hypokalemia  P: Trend BMP / urinary output Replace electrolytes as indicated Avoid nephrotoxic agents, ensure adequate renal perfusion   Canary Brim, NP-C [facility] Pgr: [phone] or if no answer [phone] 12/15/2016, 8:13 AM

## 2016-12-16 LAB — BASIC METABOLIC PANEL
Anion gap: 8 (ref 5–15)
BUN: 5 mg/dL — AB (ref 6–20)
CALCIUM: 9.3 mg/dL (ref 8.9–10.3)
CO2: 22 mmol/L (ref 22–32)
CREATININE: 0.75 mg/dL (ref 0.61–1.24)
Chloride: 110 mmol/L (ref 101–111)
GFR calc Af Amer: >60 mL/min (ref >60)
Glucose, Bld: 110 mg/dL — ABNORMAL HIGH (ref 65–99)
POTASSIUM: 3.8 mmol/L (ref 3.5–5.1)
SODIUM: 140 mmol/L (ref 135–145)

## 2016-12-16 LAB — CBC
HCT: 37.3 % — ABNORMAL LOW (ref 39.0–52.0)
Hemoglobin: 12.6 g/dL — ABNORMAL LOW (ref 13.0–17.0)
MCH: 33.7 pg (ref 26.0–34.0)
MCHC: 33.8 g/dL (ref 30.0–36.0)
MCV: 99.7 fL (ref 78.0–100.0)
PLATELETS: 193 10*3/uL (ref 150–400)
RBC: 3.74 MIL/uL — ABNORMAL LOW (ref 4.22–5.81)
RDW: 14.5 % (ref 11.5–15.5)
WBC: 6.5 10*3/uL (ref 4.0–10.5)

## 2016-12-16 LAB — MAGNESIUM: MAGNESIUM: 1.9 mg/dL (ref 1.7–2.4)

## 2016-12-16 LAB — GLUCOSE, CAPILLARY: GLUCOSE-CAPILLARY: 96 mg/dL (ref 65–99)

## 2016-12-16 MED ORDER — LIP MEDEX EX OINT
TOPICAL_OINTMENT | CUTANEOUS | Status: AC
  Administered 2016-12-16: 08:00:00
  Filled 2016-12-16: qty 7

## 2016-12-16 MED ORDER — HALOPERIDOL LACTATE 5 MG/ML IJ SOLN
2.0000 mg | INTRAMUSCULAR | Status: DC | PRN
  Administered 2016-12-16: 2 mg via INTRAVENOUS
  Filled 2016-12-16: qty 1

## 2016-12-16 MED ORDER — HALOPERIDOL LACTATE 5 MG/ML IJ SOLN
INTRAMUSCULAR | Status: AC
  Filled 2016-12-16: qty 1

## 2016-12-16 NOTE — Progress Notes (Signed)
eLink Physician-Brief Progress Note Patient Name: Brett Molina DOB: September 30, 1990 MRN: [id number]   Date of Service  12/16/2016  HPI/Events of Note  Agitated Delirium - Already on Precedex IV infusion and Ativan per CIWA score. QTc interval = 453 milliseconds.   eICU Interventions  Will order: 1. Haldol 2 mg IV Q 3 hours PRN. 2. Monitor QTc interval Q 6 hours. Notify MD if QTc interval > 500 milliseconds.      Intervention Category Major Interventions: Delirium, psychosis, severe agitation - evaluation and management  Sommer,Steven Eugene 12/16/2016, 10:27 PM

## 2016-12-16 NOTE — Progress Notes (Signed)
Alcohol withdrawal requiring precedex Date:  December 16, 2016 Chart reviewed for concurrent status and case management needs. Will continue to follow patient progress. Discharge Planning: following for needs Expected discharge date: [id number] Marcelle Smiling, BSN, [place], [place]   [phone]

## 2016-12-16 NOTE — Progress Notes (Signed)
PULMONARY / CRITICAL CARE MEDICINE   Name: Brett Molina MRN: [id number] DOB: 18-Jul-1990    ADMISSION DATE:  12/11/2016 CONSULTATION DATE:  12/13/16  REFERRING MD:  Brett Coyer MD  CHIEF COMPLAINT:  Alcohol withdrawal requiring precedex  BRIEF SUMMARY:   26 year old with history of alcohol abuse, depression. Admitted with alcohol intoxication, mild ketosis. Went into full blown withdrawal 6/9 and was started on precedex drip, nitro drip for elevated BP PCCM consulted for help with management and taken over while on precedex drip.  SUBJECTIVE:  IV sedation during night for hallucinations. Remains on high dose precedex. VITAL SIGNS: BP (!) 184/128 Comment: prn hydralazine given   Pulse 84   Temp (!) 96.7 F (35.9 C) (Axillary) Comment: RN notified  Resp 19   Ht 6\' 2"  (1.88 m)   Wt 233 lb (105.7 kg)   SpO2 98%   BMI 29.92 kg/m   HEMODYNAMICS:    VENTILATOR SETTINGS:    INTAKE / OUTPUT: I/O last 3 completed shifts: In: 5515.4 [P.O.:1640; I.V.:3875.4] Out: 4650 [Urine:4650]  PHYSICAL EXAMINATION: General:  WNWDAAM NAD at rest HEENT: No JVD/LAN KFM:MCRFVOH affect Neuro: Follows commands but still hallucinating  CV: s1s2 rrr, no m/r/g PULM: even/non-labored, lungs bilaterally decreased KG:OVPC, non-tender, bsx4 active  Extremities: warm/dry, Neg edema  Skin: no rashes or lesions    LABS:  BMET  Recent Labs Lab 12/14/16 0315 12/15/16 0410 12/16/16 0332  NA 139 140 140  K 3.3* 3.7 3.8  CL 104 109 110  CO2 23 20* 22  BUN 7 6 5*  CREATININE 0.74 0.65 0.75  GLUCOSE 95 102* 110*    Electrolytes  Recent Labs Lab 12/14/16 0315 12/15/16 0410 12/16/16 0332  CALCIUM 8.9 8.9 9.3  MG 1.5* 2.0 1.9  PHOS 3.5 3.5  --     CBC  Recent Labs Lab 12/14/16 0315 12/15/16 0410 12/16/16 0332  WBC 7.5 6.1 6.5  HGB 12.0* 12.3* 12.6*  HCT 34.4* 36.4* 37.3*  PLT 160 172 193    Coag's No results for input(s): APTT, INR in the last 168 hours.  Sepsis Markers No  results for input(s): LATICACIDVEN, PROCALCITON, O2SATVEN in the last 168 hours.  ABG No results for input(s): PHART, PCO2ART, PO2ART in the last 168 hours.  Liver Enzymes  Recent Labs Lab 12/11/16 1214 12/14/16 0315  AST 113* 66*  ALT 56 38  ALKPHOS 66 49  BILITOT 0.8 1.3*  ALBUMIN 5.3* 4.3    Cardiac Enzymes  Recent Labs Lab 12/14/16 0315  TROPONINI <0.03    Glucose  Recent Labs Lab 12/13/16 0919 12/14/16 0739 12/15/16 0731  GLUCAP 115* 99 108*    Imaging Ct Head Wo Contrast  Result Date: 12/15/2016 CLINICAL DATA:  Alcohol withdrawal, agitation, confusion. EXAM: CT HEAD WITHOUT CONTRAST TECHNIQUE: Contiguous axial images were obtained from the base of the skull through the vertex without intravenous contrast. COMPARISON:  None. FINDINGS: Brain: No evidence of acute infarction, hemorrhage, hydrocephalus, extra-axial collection or mass lesion/mass effect. Vascular: No hyperdense vessel or unexpected calcification. Skull: Normal. Negative for fracture or focal lesion. Sinuses/Orbits: Visualized globes and orbits are unremarkable. Visualized sinuses and mastoid air cells are clear. Other: None. IMPRESSION: Normal unenhanced CT scan of the brain. Electronically Signed   By: Amie Portland M.D.   On: 12/15/2016 10:52     STUDIES:    CULTURES:   ANTIBIOTICS:   SIGNIFICANT EVENTS: 6/08  Admit with ETOH withdrawal 6/10  Tx to ICU for precedex  6/11  ? Peak of withdrawal  6/12  Improved agitation 6/13 off NTG drip. Still requires IV Sedation therefore remains on PCCM service and in ICU  LINES/TUBES:  DISCUSSION: 26 year old with history of alcohol abuse, no significant past medical history.  Admitted with DTs requiring ICU admission and Precedex.  Alcohol Withdrawal Agitated Delirium / Hallucinations - improved 6/12 but still with confusion and hallucinations 6/13 P: Precedex gtt for withdrawal , not weaning currently 6/13 PRN ativan  Scheduled Valium 2mg   Q8 Thiamine, folate, MVI  Mild elevation in AST. Likely from alcohol use. P: Trend LFT's > resolved Diet as tolerated   Hypertension Urgency -responding to treatment P: Clonidine 0.2mg  BID  Labetalol to 300 mg BID   NTG gtt off 6/13 PRN hydralazine for SBP > 170 ICU monitoring of hemodynamics Obtain renal US to r/o renal artery stenosis , pending 6/13 0700 Assess CT head to ensure no CNS lesions as source of HTN  Hypokalemia   Recent Labs Lab 12/14/16 0315 12/15/16 0410 12/16/16 0332  K 3.3* 3.7 3.8     P: Replete lytes as needed Ongoing monitoring    Brett Molina Brett Molina ACNP Brett Molina PCCM Pager [phone] till 3 pm If no answer page [phone] 12/16/2016, 7:36 AM

## 2016-12-16 NOTE — Progress Notes (Addendum)
At 2200, bed alarm went off in room.  When RN entered, patient attempting to get out of bed.  Patient extremely agitated and combative.  Patient oriented only to self and having hallucinations. Attempts to deescalate and calm patient unsuccessful.  Patient educated on why he is in the hospital and the importance of treatment. 3mg  ativan given for CIWA of 30. Precedex drip increased to max dose of .  Patient remained agitated, combative, and removing heart monitor and IV.  Security and police called to bedside.  Patient then called 911 on cell phone.  Elink MD videoed into room.  Orders for 2mg  IV haldol received and administered.  Patient assisted back to bed and currently sleeping.

## 2016-12-17 ENCOUNTER — Ambulatory Visit (HOSPITAL_COMMUNITY): Payer: Managed Care, Other (non HMO)

## 2016-12-17 DIAGNOSIS — I10 Essential (primary) hypertension: Secondary | ICD-10-CM

## 2016-12-17 LAB — BASIC METABOLIC PANEL
ANION GAP: 10 (ref 5–15)
BUN: 7 mg/dL (ref 6–20)
CHLORIDE: 107 mmol/L (ref 101–111)
CO2: 23 mmol/L (ref 22–32)
Calcium: 9.4 mg/dL (ref 8.9–10.3)
Creatinine, Ser: 0.73 mg/dL (ref 0.61–1.24)
GFR calc Af Amer: >60 mL/min (ref >60)
Glucose, Bld: 102 mg/dL — ABNORMAL HIGH (ref 65–99)
POTASSIUM: 3.7 mmol/L (ref 3.5–5.1)
Sodium: 140 mmol/L (ref 135–145)

## 2016-12-17 LAB — MAGNESIUM: Magnesium: 1.9 mg/dL (ref 1.7–2.4)

## 2016-12-17 LAB — PHOSPHORUS: Phosphorus: 4.5 mg/dL (ref 2.5–4.6)

## 2016-12-17 MED ORDER — CLONIDINE HCL 0.1 MG PO TABS
0.1000 mg | ORAL_TABLET | Freq: Once | ORAL | Status: AC
  Administered 2016-12-17: 0.1 mg via ORAL
  Filled 2016-12-17: qty 1

## 2016-12-17 NOTE — Progress Notes (Signed)
**  Preliminary report by tech**  Renal artery duplex complete. No evidence of renal artery stenosis noted bilaterally. Bilateral normal intrarenal resistive indices.  12/17/16 5:17 PM Olen Cordial RVT

## 2016-12-17 NOTE — Progress Notes (Signed)
PULMONARY / CRITICAL CARE MEDICINE   Name: Brett Molina MRN: [id number] DOB: September 20, 1990    ADMISSION DATE:  12/11/2016 CONSULTATION DATE:  12/13/16  REFERRING MD:  Thurman Coyer MD  CHIEF COMPLAINT:  Alcohol withdrawal requiring precedex  BRIEF SUMMARY:   26 year old with history of alcohol abuse, depression. Admitted with alcohol intoxication, mild ketosis. Went into full blown withdrawal 6/9 and was started on precedex drip, nitro drip for elevated BP PCCM consulted for help with management and taken over while on precedex drip.  SUBJECTIVE: Sleeping comfortably after haldol. Will let him sleep ans hopefully this will help with his agitation. Marland Kitchen VITAL SIGNS: BP (!) 156/105   Pulse 97   Temp 99.3 F (37.4 C) (Oral)   Resp 17   Ht 6\' 2"  (1.88 m)   Wt 236 lb (107 kg)   SpO2 98%   BMI 30.30 kg/m   HEMODYNAMICS:    VENTILATOR SETTINGS:    INTAKE / OUTPUT: I/O last 3 completed shifts: In: 4185.5 [P.O.:600; I.V.:3585.5] Out: 4900 [Urine:4900]  PHYSICAL EXAMINATION: General:  WNWDAAM sleeping HEENT: No jvd/lan NWG:NFAOZHYQM sleeping Neuro: sleeping CV: HSR RRR PULM: even/non-labored, lungs bilaterally clear VH:QION, non-tender, bsx4 active  Extremities: warm/dry, - edema  Skin: no rashes or lesions     LABS:  BMET  Recent Labs Lab 12/15/16 0410 12/16/16 0332 12/17/16 0316  NA 140 140 140  K 3.7 3.8 3.7  CL 109 110 107  CO2 20* 22 23  BUN 6 5* 7  CREATININE 0.65 0.75 0.73  GLUCOSE 102* 110* 102*    Electrolytes  Recent Labs Lab 12/14/16 0315 12/15/16 0410 12/16/16 0332 12/17/16 0316  CALCIUM 8.9 8.9 9.3 9.4  MG 1.5* 2.0 1.9 1.9  PHOS 3.5 3.5  --  4.5    CBC  Recent Labs Lab 12/14/16 0315 12/15/16 0410 12/16/16 0332  WBC 7.5 6.1 6.5  HGB 12.0* 12.3* 12.6*  HCT 34.4* 36.4* 37.3*  PLT 160 172 193    Coag's No results for input(s): APTT, INR in the last 168 hours.  Sepsis Markers No results for input(s): LATICACIDVEN, PROCALCITON,  O2SATVEN in the last 168 hours.  ABG No results for input(s): PHART, PCO2ART, PO2ART in the last 168 hours.  Liver Enzymes  Recent Labs Lab 12/11/16 1214 12/14/16 0315  AST 113* 66*  ALT 56 38  ALKPHOS 66 49  BILITOT 0.8 1.3*  ALBUMIN 5.3* 4.3    Cardiac Enzymes  Recent Labs Lab 12/14/16 0315  TROPONINI <0.03    Glucose  Recent Labs Lab 12/13/16 0919 12/14/16 0739 12/15/16 0731 12/16/16 0830  GLUCAP 115* 99 108* 96    Imaging No results found.   STUDIES:    CULTURES:   ANTIBIOTICS:   SIGNIFICANT EVENTS: 6/08  Admit with ETOH withdrawal 6/10  Tx to ICU for precedex  6/11  ? Peak of withdrawal 6/12  Improved agitation 6/13 off NTG drip. Still requires IV Sedation therefore remains on PCCM service and in ICU 6/14 haldol added  LINES/TUBES:  DISCUSSION: 26 year old with history of alcohol abuse, no significant past medical history.  Admitted with DTs requiring ICU admission and Precedex. 6/14 sleeping after haldol administration.  Alcohol Withdrawal Agitated Delirium / Hallucinations - improved 6/12 but still with confusion and hallucinations 6/13. 6/14 sleeping P: Precedex gtt for withdrawal , not weaning currently 6/14 Haldol added 6/14 PRN ativan  Scheduled Valium 2mg  Q8 Thiamine, folate, MVI  Mild elevation in AST. Likely from alcohol use. P: Trend LFT's > resolved Diet as tolerated  Hypertension Urgency -responding to treatment BP elevated when agitated P: Clonidine 0.2mg  BID  Labetalol to 300 mg BID   NTG gtt off 6/13 PRN hydralazine for SBP > 170 ICU monitoring of hemodynamics Obtain renal US to r/o renal artery stenosis , ordered 6/14 Assess CT head to ensure no CNS lesions as source of HTN(neg 6/12)  Hypokalemia   Recent Labs Lab 12/15/16 0410 12/16/16 0332 12/17/16 0316  K 3.7 3.8 3.7     P: Replete lytes as needed Ongoing monitoring    Brett Canales Leyan Branden ACNP [facility] PCCM Pager [phone] till 3 pm If no answer  page [phone] 12/17/2016, 7:59 AM

## 2016-12-17 NOTE — Progress Notes (Signed)
eLink Physician-Brief Progress Note Patient Name: Brett Molina DOB: Aug 06, 1990 MRN: [id number]   Date of Service  12/17/2016  HPI/Events of Note  Hypertension On precedex Received hydralazine at 11PM  eICU Interventions  Clonidine 0.1mg  po now     Intervention Category Major Interventions: Hypertension - evaluation and management  Max Fickle 12/17/2016, 2:05 AM

## 2016-12-18 LAB — BASIC METABOLIC PANEL
Anion gap: 10 (ref 5–15)
BUN: 12 mg/dL (ref 6–20)
CHLORIDE: 107 mmol/L (ref 101–111)
CO2: 23 mmol/L (ref 22–32)
CREATININE: 0.88 mg/dL (ref 0.61–1.24)
Calcium: 9.1 mg/dL (ref 8.9–10.3)
GFR calc non Af Amer: >60 mL/min (ref >60)
Glucose, Bld: 127 mg/dL — ABNORMAL HIGH (ref 65–99)
Potassium: 3.8 mmol/L (ref 3.5–5.1)
SODIUM: 140 mmol/L (ref 135–145)

## 2016-12-18 LAB — PHOSPHORUS: PHOSPHORUS: 4.2 mg/dL (ref 2.5–4.6)

## 2016-12-18 LAB — MAGNESIUM: MAGNESIUM: 2 mg/dL (ref 1.7–2.4)

## 2016-12-18 LAB — GLUCOSE, CAPILLARY: Glucose-Capillary: 101 mg/dL — ABNORMAL HIGH (ref 65–99)

## 2016-12-18 NOTE — Progress Notes (Signed)
eLink Physician-Brief Progress Note Patient Name: Brett Molina DOB: 04/12/1991 MRN: [id number]   Date of Service  12/18/2016  HPI/Events of Note  Patient stable off a Precedex IV infusion since 9 AM. Looks comfortable and calm on video assessment.   eICU Interventions  Will transfer to Telemetry Bed.         Sommer,Steven Dennard Nip 12/18/2016, 4:50 PM

## 2016-12-18 NOTE — Progress Notes (Signed)
PULMONARY / CRITICAL CARE MEDICINE   Name: Brett Molina MRN: [id number] DOB: 1991-02-02    ADMISSION DATE:  12/11/2016 CONSULTATION DATE:  12/13/16  REFERRING MD:  Thurman Coyer MD  CHIEF COMPLAINT:  Alcohol withdrawal requiring precedex  BRIEF SUMMARY:   26 year old with history of alcohol abuse, depression. Admitted with alcohol intoxication, mild ketosis. Went into full blown withdrawal 6/9 and was started on precedex drip, nitro drip for elevated BP PCCM consulted for help with management and taken over while on precedex drip.    SUBJECTIVE:  Pt reports feeling much better.  Doesn't remember much in regards to his withdrawal.  States he has residual "pins and needles" in his feet.  On 0.29mcg of Precedex > calm   VITAL SIGNS: BP (!) 145/83   Pulse 97   Temp 98.9 F (37.2 C) (Oral)   Resp (!) 21   Ht 6\' 2"  (1.88 m)   Wt 228 lb (103.4 kg)   SpO2 99%   BMI 29.27 kg/m   HEMODYNAMICS:    VENTILATOR SETTINGS:    INTAKE / OUTPUT: I/O last 3 completed shifts: In: 4534.4 [P.O.:1080; I.V.:3454.4] Out: 4300 [Urine:4300]  PHYSICAL EXAMINATION: General: well developed adult male in NAD  HEENT: MM pink/moist, no jvd PSY: calm/appropriate Neuro: AAOx4, speech clear, MAE, normal range of motion, no tremor  CV: s1s2 rrr, no m/r/g PULM: even/non-labored, lungs bilaterally clear  QJ:FHLK, non-tender, bsx4 active  Extremities: warm/dry, no edema  Skin: no rashes or lesions   LABS:  BMET  Recent Labs Lab 12/16/16 0332 12/17/16 0316 12/18/16 0301  NA 140 140 140  K 3.8 3.7 3.8  CL 110 107 107  CO2 22 23 23   BUN 5* 7 12  CREATININE 0.75 0.73 0.88  GLUCOSE 110* 102* 127*    Electrolytes  Recent Labs Lab 12/15/16 0410 12/16/16 0332 12/17/16 0316 12/18/16 0301  CALCIUM 8.9 9.3 9.4 9.1  MG 2.0 1.9 1.9 2.0  PHOS 3.5  --  4.5 4.2    CBC  Recent Labs Lab 12/14/16 0315 12/15/16 0410 12/16/16 0332  WBC 7.5 6.1 6.5  HGB 12.0* 12.3* 12.6*  HCT 34.4* 36.4* 37.3*   PLT 160 172 193    Coag's No results for input(s): APTT, INR in the last 168 hours.  Sepsis Markers No results for input(s): LATICACIDVEN, PROCALCITON, O2SATVEN in the last 168 hours.  ABG No results for input(s): PHART, PCO2ART, PO2ART in the last 168 hours.  Liver Enzymes  Recent Labs Lab 12/11/16 1214 12/14/16 0315  AST 113* 66*  ALT 56 38  ALKPHOS 66 49  BILITOT 0.8 1.3*  ALBUMIN 5.3* 4.3    Cardiac Enzymes  Recent Labs Lab 12/14/16 0315  TROPONINI <0.03    Glucose  Recent Labs Lab 12/13/16 0919 12/14/16 0739 12/15/16 0731 12/16/16 0830 12/18/16 0745  GLUCAP 115* 99 108* 96 101*    Imaging No results found.   STUDIES:  Renal US 6/14 >> negative preliminary >> CT Head 6/12 >> negative CT head  CULTURES:   ANTIBIOTICS:   SIGNIFICANT EVENTS: 6/08  Admit with ETOH withdrawal 6/10  Tx to ICU for precedex  6/11  ? Peak of withdrawal 6/12  Improved agitation 6/13  off NTG drip. Still requires IV Sedation therefore remains on PCCM service and in ICU 6/14  haldol added  LINES/TUBES:  DISCUSSION: 26 year old with history of alcohol abuse, no significant past medical history.  Admitted with DTs requiring ICU admission and Precedex. 6/14 sleeping after haldol administration.  Alcohol Withdrawal  Agitated Delirium / Hallucinations - improved 6/12 but still with confusion and hallucinations 6/13. 6/14 sleeping P: Wean precedex to off  PRN haldol  PRN ativan  Valium 2mg  PO Q8 > consider reduction 6/16.  Will need to wean off before discharge.  Thiamine, folate, MVI Will ask SW to assist with outpatient counseling  Ambulate in hall   Mild elevation in AST. Likely from alcohol use. P: Diet as tolerated   Hypertension Urgency - responding to treatment, renal US negative for stenosis BP elevated when agitated P: Continue clonidine 0.2mg  BID, Labetalol 300 mg BID PRN Hydralazine    Hypokalemia  P: Trend BMP / urinary output Replace  electrolytes as indicated Avoid nephrotoxic agents, ensure adequate renal perfusion     Canary Brim, NP-C [facility] Pgr: [phone] or if no answer [phone] 12/18/2016, 8:19 AM '

## 2016-12-19 LAB — BASIC METABOLIC PANEL
Anion gap: 12 (ref 5–15)
BUN: 8 mg/dL (ref 6–20)
CHLORIDE: 105 mmol/L (ref 101–111)
CO2: 22 mmol/L (ref 22–32)
Calcium: 9.7 mg/dL (ref 8.9–10.3)
Creatinine, Ser: 0.83 mg/dL (ref 0.61–1.24)
Glucose, Bld: 104 mg/dL — ABNORMAL HIGH (ref 65–99)
POTASSIUM: 3.7 mmol/L (ref 3.5–5.1)
SODIUM: 139 mmol/L (ref 135–145)

## 2016-12-19 LAB — CBC
HEMATOCRIT: 38.3 % — AB (ref 39.0–52.0)
HEMOGLOBIN: 13.1 g/dL (ref 13.0–17.0)
MCH: 34.2 pg — AB (ref 26.0–34.0)
MCHC: 34.2 g/dL (ref 30.0–36.0)
MCV: 100 fL (ref 78.0–100.0)
PLATELETS: 295 10*3/uL (ref 150–400)
RBC: 3.83 MIL/uL — ABNORMAL LOW (ref 4.22–5.81)
RDW: 13.6 % (ref 11.5–15.5)
WBC: 6.7 10*3/uL (ref 4.0–10.5)

## 2016-12-19 LAB — GLUCOSE, CAPILLARY: Glucose-Capillary: 93 mg/dL (ref 65–99)

## 2016-12-19 LAB — MAGNESIUM: MAGNESIUM: 2.1 mg/dL (ref 1.7–2.4)

## 2016-12-19 MED ORDER — LORAZEPAM 2 MG/ML IJ SOLN
2.0000 mg | Freq: Once | INTRAMUSCULAR | Status: AC
  Administered 2016-12-19: 2 mg via INTRAVENOUS
  Filled 2016-12-19: qty 1

## 2016-12-19 MED ORDER — LORAZEPAM 1 MG PO TABS
1.0000 mg | ORAL_TABLET | Freq: Four times a day (QID) | ORAL | Status: DC | PRN
  Administered 2016-12-19: 1 mg via ORAL
  Filled 2016-12-19: qty 1

## 2016-12-19 MED ORDER — THIAMINE HCL 100 MG/ML IJ SOLN
500.0000 mg | Freq: Once | INTRAVENOUS | Status: AC
  Administered 2016-12-19: 500 mg via INTRAVENOUS
  Filled 2016-12-19: qty 5

## 2016-12-19 MED ORDER — GABAPENTIN 100 MG PO CAPS
100.0000 mg | ORAL_CAPSULE | Freq: Every day | ORAL | Status: DC
  Administered 2016-12-19 – 2016-12-23 (×5): 100 mg via ORAL
  Filled 2016-12-19 (×5): qty 1

## 2016-12-19 MED ORDER — LORAZEPAM 2 MG/ML IJ SOLN
2.0000 mg | INTRAMUSCULAR | Status: DC | PRN
  Administered 2016-12-19: 2 mg via INTRAMUSCULAR

## 2016-12-19 MED ORDER — LORAZEPAM 2 MG/ML IJ SOLN
1.0000 mg | INTRAMUSCULAR | Status: DC | PRN
  Administered 2016-12-19 – 2016-12-20 (×2): 2 mg via INTRAVENOUS
  Filled 2016-12-19 (×2): qty 1

## 2016-12-19 MED ORDER — ADULT MULTIVITAMIN W/MINERALS CH: 1.0000 | ORAL_TABLET | Freq: Every day | ORAL | Status: DC

## 2016-12-19 MED ORDER — THIAMINE HCL 100 MG/ML IJ SOLN: 100.0000 mg | Freq: Every day | INTRAMUSCULAR | Status: DC

## 2016-12-19 MED ORDER — SODIUM CHLORIDE 0.9 % IV SOLN
INTRAVENOUS | Status: DC
  Administered 2016-12-20 – 2016-12-21 (×4): via INTRAVENOUS

## 2016-12-19 MED ORDER — FOLIC ACID 1 MG PO TABS: 1.0000 mg | ORAL_TABLET | Freq: Every day | ORAL | Status: DC

## 2016-12-19 MED ORDER — LORAZEPAM 2 MG/ML IJ SOLN
1.0000 mg | Freq: Four times a day (QID) | INTRAMUSCULAR | Status: DC | PRN
  Administered 2016-12-19: 1 mg via INTRAVENOUS
  Filled 2016-12-19: qty 1

## 2016-12-19 MED ORDER — THIAMINE HCL 100 MG/ML IJ SOLN
Freq: Once | INTRAVENOUS | Status: DC
  Filled 2016-12-19: qty 1000

## 2016-12-19 MED ORDER — ENOXAPARIN SODIUM 40 MG/0.4ML ~~LOC~~ SOLN
40.0000 mg | SUBCUTANEOUS | Status: DC
  Administered 2016-12-19 – 2016-12-29 (×11): 40 mg via SUBCUTANEOUS
  Filled 2016-12-19 (×11): qty 0.4

## 2016-12-19 MED ORDER — LORAZEPAM 2 MG/ML IJ SOLN: 2.0000 mg | INTRAMUSCULAR | Status: DC | PRN

## 2016-12-19 MED ORDER — HALOPERIDOL LACTATE 5 MG/ML IJ SOLN
2.0000 mg | Freq: Four times a day (QID) | INTRAMUSCULAR | Status: DC | PRN
  Filled 2016-12-19 (×2): qty 1

## 2016-12-19 MED ORDER — VITAMIN B-1 100 MG PO TABS: 100.0000 mg | ORAL_TABLET | Freq: Every day | ORAL | Status: DC

## 2016-12-19 NOTE — Progress Notes (Signed)
PROGRESS NOTE  Brett Molina EKC:[id number] DOB: [birth year] DOA: 12/11/2016 PCP: Patient, No Pcp Per   Brief Summary:  Brett Molina is a 26 y.o. male with medical history significant for alcohol abuse, depression. Patient presented to ED on 6/8 for evaluation of depression and alcoholism but per psych eval in ED, pt at risk for withdrawals so not stable to come to [facility]. Patient has no suicidal ideations at this time, no hallucinations. No chest pain, shortness of breath or palpitations. No abdominal pain, nausea or vomiting.  Patient Went into full blown withdrawal 6/9 and was extremely confused and agitated, he is started on precedex drip, nitro drip for elevated BP He is transferred to icu under critical care service , he received prn ativan, haldol  While in the ICU, he is weaned off precedex drip on 6/15, and transferred back to hospitalist service pn 6/16.   HPI/Recap of past 24 hours:  Intermittent anxiety, sinus tachycardia, no fever, pleasant during encounter,  He report significant numbness and tingling bilateral lower extremity  Assessment/Plan: Principal Problem:   Alcoholic intoxication without complication (HCC) Active Problems:   Hypokalemia   Alcoholic hepatitis without ascites   MDD (major depressive disorder)   Alcoholic intoxication ,severe alcohol withdrawal required ICU  Care from 6/9 to 6/15 - - Alcohol level 493 in ED -Patient Went into full blown withdrawal 6/9 and was extremely confused and agitated, he is started on precedex drip, nitro drip for elevated BP He was transferred to icu -he received prn ativan, haldol  While in the ICU, he is weaned off precedex drip on 6/15, and transferred back to hospitalist service pn 6/16. -still has sinus tachcardia, intermittent agitation -- Continue ciwa protocol, IV fluids, multivitamin, thiamine and folic acid, taper valium gradually -patient is minimizing alcohol use , he reports only started drinking alcohol for a few  months, however, I have received past record which showed has was hospitalized for alcohol withdrawal in 07/2016 , he  Reported he started drinking a few months prior to that hospitalization, he apperently also has h/o alcohol withdrawal seizure , he has h/o DUI, need social worker consult   Significant Peripheral neuropathy with unsteady gait Likely from alcohol use Will check rpr, b12,folate Start neurontin, titrate to effect Will get PT  Transaminitis: likely Alcoholic hepatitis  - AST> ALT  AST 113 (ALT WNL 56) on admission -Denies n/v, no abdominal pain -will check hepatitis panel,   Hypokalemia/hypomagnesemia Likely from alcohol use Replace, monitor  HTN;  He initially required nitro drip due to significantly elevated blood pressure Currently on clonidine, labetalol,  need to continue titrate bp meds     Depression/anxiety -He was brought to the ED by his family due to concerning alcohol use/ depression/anxiety which start to affect his life/work  --Currently denies SI/HI - Will consult psych for evaluation   Overweight:  Body mass index is 29.27 kg/m.   DVT prophylaxis: SCD's /lovenox Code Status: full code Family Communication: no family at the bedside Disposition Plan: pending, need psych eval , need to have physical therapy ( significant peripheral neuropathy, unsteady) Consults called:  Psych Critical care   Procedures:  none  Antibiotics:  none   Objective: BP (!) 149/95 (BP Location: Right Arm)   Pulse (!) 104   Temp 99.3 F (37.4 C) (Oral)   Resp 18   Ht 6\' 2"  (1.88 m)   Wt 103.4 kg (228 lb)   SpO2 100%   BMI 29.27 kg/m   Intake/Output Summary (  Last 24 hours) at 12/19/16 0822 Last data filed at 12/19/16 0814  Gross per 24 hour  Intake          2242.22 ml  Output             2500 ml  Net          -257.78 ml   Filed Weights   12/16/16 0308 12/17/16 0144 12/18/16 0300  Weight: 105.7 kg (233 lb) 107 kg (236 lb) 103.4 kg (228 lb)     Exam:   General:  Slightly anxious, but pleasant and cooperative  Cardiovascular: sinus tachycardia  Respiratory: CTABL  Abdomen: Soft/ND/NT, positive BS  Musculoskeletal: No Edema  Neuro: no focal deficit, aaox3,   Data Reviewed: Basic Metabolic Panel:  Recent Labs Lab 12/14/16 0315 12/15/16 0410 12/16/16 0332 12/17/16 0316 12/18/16 0301 12/19/16 0639  NA 139 140 140 140 140 139  K 3.3* 3.7 3.8 3.7 3.8 3.7  CL 104 109 110 107 107 105  CO2 23 20* 22 23 23 22   GLUCOSE 95 102* 110* 102* 127* 104*  BUN 7 6 5* 7 12 8   CREATININE 0.74 0.65 0.75 0.73 0.88 0.83  CALCIUM 8.9 8.9 9.3 9.4 9.1 9.7  MG 1.5* 2.0 1.9 1.9 2.0 2.1  PHOS 3.5 3.5  --  4.5 4.2  --    Liver Function Tests:  Recent Labs Lab 12/14/16 0315  AST 66*  ALT 38  ALKPHOS 49  BILITOT 1.3*  PROT 7.2  ALBUMIN 4.3   No results for input(s): LIPASE, AMYLASE in the last 168 hours.  Recent Labs Lab 12/14/16 0315  AMMONIA 17   CBC:  Recent Labs Lab 12/13/16 0328 12/14/16 0315 12/15/16 0410 12/16/16 0332 12/19/16 0639  WBC 5.4 7.5 6.1 6.5 6.7  HGB 12.1* 12.0* 12.3* 12.6* 13.1  HCT 35.3* 34.4* 36.4* 37.3* 38.3*  MCV 98.6 96.9 100.6* 99.7 100.0  PLT 166 160 172 193 295   Cardiac Enzymes:    Recent Labs Lab 12/14/16 0315  TROPONINI <0.03   BNP (last 3 results) No results for input(s): BNP in the last 8760 hours.  ProBNP (last 3 results) No results for input(s): PROBNP in the last 8760 hours.  CBG:  Recent Labs Lab 12/14/16 0739 12/15/16 0731 12/16/16 0830 12/18/16 0745 12/19/16 0753  GLUCAP 99 108* 96 101* 93    Recent Results (from the past 240 hour(s))  MRSA PCR Screening     Status: None   Collection Time: 12/12/16  9:52 PM  Result Value Ref Range Status   MRSA by PCR NEGATIVE NEGATIVE Final    Comment:        The GeneXpert MRSA Assay (FDA approved for NASAL specimens only), is one component of a comprehensive MRSA colonization surveillance program. It is  not intended to diagnose MRSA infection nor to guide or monitor treatment for MRSA infections.      Studies: No results found.  Scheduled Meds: . cloNIDine  0.2 mg Oral BID  . diazepam  2 mg Oral Q8H  . folic acid  1 mg Oral Daily  . labetalol  300 mg Oral BID  . multivitamin with minerals  1 tablet Oral Daily  . sodium chloride flush  3 mL Intravenous Q12H  . thiamine  100 mg Oral Daily   Or  . thiamine  100 mg Intravenous Daily    Continuous Infusions: . sodium chloride Stopped (12/19/16 0545)  . dexmedetomidine (PRECEDEX) IV infusion Stopped (12/18/16 1610)     Time  spent:  Rosalie Buenaventura MD, PhD  [facility] Pager [phone]. If 7PM-7AM, please contact night-coverage at [email or web address], password [facility] 12/19/2016, 8:22 AM  LOS: 7 days

## 2016-12-19 NOTE — Progress Notes (Signed)
Page: Brett Molina [phone] pt. getting OOB, took IV apart, hiding behind door and almost fell d/t unsteadiness and is dazed and confused.

## 2016-12-19 NOTE — Progress Notes (Addendum)
Patient seen and evaluated at bedside.  Patient only with mild tremor, unlikely to be in withdrawal at this late date.  On my evaluation he is able to state the year, the month, the date, incorrectly states it is Monday instead of Saturday, knows tomorrow is fathers day (and is wanting to leave because of this actually).  Has nystagmus but no lateral rectus palsy.  Has minimal confabulation when getting answers incorrect to some orientation questions.  Does have significant gait ataxia even with use of walker.  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  (Leaning more twords a chronic Korsakoff type syndrome than acute Wernicke Encephalopathy at this point).  1) Transfer to SDU 2) Had long discussion with patient and patients father over phone.  Was able to convince patient to stay. 3) Ordering Thiamine 500mg  IV x1 now for empiric treatment. 4) will attempt to confirm diagnosis / rule out acute Wernike encephalopathy (since this would require ongoing higher doses of thiamine) by obtaining MRI brain 5) thiamine level (has been getting the 100mg  once daily since admission 8 days ago)  So unclear how much additional thaimine he needs at this point (only ordering the one time dose of 500mg  for now).  CRITICAL CARE Performed by: Hillary Bow.   Total critical care time: 60  minutes  Critical care time was exclusive of separately billable procedures and treating other patients.  Critical care was necessary to treat or prevent imminent or life-threatening deterioration.  Critical care was time spent personally by me on the following activities: development of treatment plan with patient and/or surrogate as well as nursing, discussions with consultants, evaluation of patient's response to treatment, examination of patient, obtaining history from patient or  surrogate, ordering and performing treatments and interventions, ordering and review of laboratory studies, ordering and review of radiographic studies, pulse oximetry and re-evaluation of patient's condition.

## 2016-12-19 NOTE — Progress Notes (Signed)
The pt. Asked if we were closing when the lights in the hallway turned down. Explained to him we are just getting ready for everyone to settle down that he is in the hospital. Pt. states he has to leave to go home for the night. States he hasn't been home in days and he's tired from working nightshift. Reports the [facility] will come and get him but he can't stay because his family is going to come and see him at his brother's house where he lives. Attempted to explain to him that he is in the hospital and that he is supposed to stay here overnight for [place] to continue to help him. He reports he doesn't drink and hasn't drank anything in weeks. He states he is here because he gets dehydrated easily and he needed some fluids and he keeps asking how many bags of fluid he's going to need after this one. He reports he's trying to let this one finish but it's taking too long. Explained to him that I had more medications to give him in a bit and that we needed him to stay here to get better and rebuild his strength since he reports that is what he is trying to do.

## 2016-12-19 NOTE — Progress Notes (Signed)
Called the on call Dr. And she states she is working remote and cannot evaluate the pt. Called Dr. Beacher May and he said he is busy in ED but it sounds like the pt. Needs to be moved to a higher level of acuity. Asked if he would place the order and he said he will leave that to the floor coverage.

## 2016-12-19 NOTE — Evaluation (Signed)
Physical Therapy Evaluation Patient Details Name: Brett Molina MRN: [id number] DOB: 11/03/1990 Today's Date: 12/19/2016   History of Present Illness  Patient is a 26 yo male admitted 12/11/16 with depression and ETOH abuse.  Patient in full withdrawal on 6/9.  Became confused and agitated.  With significant HTN, tachycardia, tremors, diaphoretic.  Patient moved to ICU.       PMH:   ETOH abuse, depression     Clinical Impression  Patient presents with problems listed below.  Will benefit from acute PT to maximize functional independence prior to discharge. Patient was independent pta.  Today patient required mod A for gait, with ataxic movement, poor balance/fall risk. Recommend HHPT at discharge for continued therapy for mobility/balance.  At this point, patient needs RW for gait.      Follow Up Recommendations Home health PT;Supervision for mobility/OOB  (If to [facility], recommend continued PT there if available)    Equipment Recommendations  Rolling walker with 5" wheels    Recommendations for Other Services OT consult     Precautions / Restrictions Precautions Precautions: Fall Precaution Comments: decreased balance and tremors Restrictions Weight Bearing Restrictions: No      Mobility  Bed Mobility Overal bed mobility: Needs Assistance Bed Mobility: Supine to Sit;Sit to Supine     Supine to sit: Supervision Sit to supine: Supervision   General bed mobility comments: Assist for supervision for safety.  Transfers Overall transfer level: Needs assistance Equipment used: Rolling walker (2 wheeled) Transfers: Sit to/from Stand Sit to Stand: Min assist         General transfer comment: Verbal cues for hand placement.  Difficulty following command.  Patient able to power up to standing.  Movement is very ataxic with decreased balance, requiring assist to prevent fall.  Performed x3.  Ambulation/Gait Ambulation/Gait assistance: Mod assist;Min assist Ambulation Distance  (Feet): 120 Feet (120' x2 with sitting rest break) Assistive device: Rolling walker (2 wheeled) Gait Pattern/deviations: Step-through pattern;Decreased step length - right;Decreased step length - left;Decreased stride length;Ataxic;Staggering left;Staggering right Gait velocity: decreased Gait velocity interpretation: Below normal speed for age/gender General Gait Details: Patient with very unsteady gait, including tremors throughout body.  Initially patient moving very quickly with loss of balance requiring mod assist.  Seated rest.  Patient repeated ambulation 120'.  Patient focused on going slowly.  Noted more controlled gait with no loss of balance.  Required repeated cues to move slowly.  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    Modified Rankin (Stroke Patients Only)       Balance Overall balance assessment: Needs assistance Sitting-balance support: No upper extremity supported;Feet supported Sitting balance-Leahy Scale: Good     Standing balance support: Bilateral upper extremity supported Standing balance-Leahy Scale: Poor                               Pertinent Vitals/Pain Pain Assessment: 0-10 Pain Score: 2  Pain Location: Plantar surface Rt foot Pain Descriptors / Indicators: Pins and needles Pain Intervention(s): Monitored during session    Home Living Family/patient expects to be discharged to:: Other (Comment) [facility]?) Living Arrangements: Other relatives (brother and cousin) Available Help at Discharge: Family;Available 24 hours/day (Cousin available) Type of Home: House Home Access: Stairs to enter Entrance Stairs-Rails: None Entrance Stairs-Number of Steps: 1 Home Layout: Two level;Bed/bath  upstairs Home Equipment: None      Prior Function Level of Independence: Independent         Comments: Independent with ADL's.  Drives.     Hand Dominance         Extremity/Trunk Assessment   Upper Extremity Assessment Upper Extremity Assessment: Overall WFL for tasks assessed (Decreased fine motor coordination)    Lower Extremity Assessment Lower Extremity Assessment: RLE deficits/detail;LLE deficits/detail RLE Deficits / Details: Decreased coordination.  Strength WNL.  Ataxic movements RLE Sensation: decreased light touch (lower leg) RLE Coordination: decreased gross motor LLE Deficits / Details: Decreased coordination.  Strength WNL.  Ataxic movements LLE Sensation: decreased light touch (lower leg) LLE Coordination: decreased gross motor    Cervical / Trunk Assessment Cervical / Trunk Assessment: Other exceptions Cervical / Trunk Exceptions: Tremors of trunk during gait.  Communication   Communication: No difficulties  Cognition Arousal/Alertness: Awake/alert Behavior During Therapy: Impulsive;Anxious Overall Cognitive Status: No family/caregiver present to determine baseline cognitive functioning                                 General Comments: Patient is oriented.  However has decreased safety awareness.  Confused at times, pulling out IV's.  Some difficulty following commands.      General Comments      Exercises Other Exercises Other Exercises: Patient performed shallow squats x 8.  Patient then performed shallow squats very slowly focusing on control of LE's.   Assessment/Plan    PT Assessment Patient needs continued PT services  PT Problem List Decreased activity tolerance;Decreased balance;Decreased mobility;Decreased coordination;Decreased cognition;Decreased knowledge of use of DME;Decreased safety awareness;Impaired sensation       PT Treatment Interventions DME instruction;Gait training;Stair training;Functional mobility training;Therapeutic activities;Therapeutic exercise;Balance training;Cognitive remediation;Patient/family education    PT Goals (Current goals can be found in the Care Plan  section)  Acute Rehab PT Goals Patient Stated Goal: To get back to normal PT Goal Formulation: With patient Time For Goal Achievement: 12/26/16 Potential to Achieve Goals: Good    Frequency Min 3X/week   Barriers to discharge Inaccessible home environment Bed/bath upstairs with 20 steps per patient.    Co-evaluation               AM-PAC PT "6 Clicks" Daily Activity  Outcome Measure Difficulty turning over in bed (including adjusting bedclothes, sheets and blankets)?: None Difficulty moving from lying on back to sitting on the side of the bed? : None Difficulty sitting down on and standing up from a chair with arms (e.g., wheelchair, bedside commode, etc,.)?: Total Help needed moving to and from a bed to chair (including a wheelchair)?: A Little Help needed walking in hospital room?: A Lot Help needed climbing 3-5 steps with a railing? : A Lot 6 Click Score: 16    End of Session Equipment Utilized During Treatment: Gait belt Activity Tolerance: Patient limited by fatigue Patient left: in bed;with call bell/phone within reach;with bed alarm set Nurse Communication: Mobility status PT Visit Diagnosis: Unsteadiness on feet (R26.81);Other abnormalities of gait and mobility (R26.89);Ataxic gait (R26.0)    Time: 4098-1191 PT Time Calculation (min) (ACUTE ONLY): 25 min   Charges:   PT Evaluation $PT Eval Moderate Complexity: 1 Procedure PT Treatments $Gait Training: 8-22 mins   PT G Codes:        Durenda Hurt. Renaldo Fiddler, [facility] Acute Rehab Services Pager [phone]   Vena Austria 12/19/2016, 7:10 PM

## 2016-12-19 NOTE — Progress Notes (Signed)
Pt increasingly agitated, climbing OOB despite redirecting and safety education. Pt is very unsteady, very unsafe and will not follow directions. Pt stating he is going to leave and attempting to leave floor, nurse attempting to keep patient in room. Pt is not agitated and CIWA has increased significantly. MD on call paged.

## 2016-12-20 ENCOUNTER — Inpatient Hospital Stay (HOSPITAL_COMMUNITY): Payer: Managed Care, Other (non HMO)

## 2016-12-20 DIAGNOSIS — F10231 Alcohol dependence with withdrawal delirium: Secondary | ICD-10-CM

## 2016-12-20 DIAGNOSIS — D7589 Other specified diseases of blood and blood-forming organs: Secondary | ICD-10-CM

## 2016-12-20 DIAGNOSIS — G9340 Encephalopathy, unspecified: Secondary | ICD-10-CM

## 2016-12-20 LAB — COMPREHENSIVE METABOLIC PANEL
ALK PHOS: 46 U/L (ref 38–126)
ALT: 72 U/L — AB (ref 17–63)
AST: 60 U/L — ABNORMAL HIGH (ref 15–41)
Albumin: 4.4 g/dL (ref 3.5–5.0)
Anion gap: 10 (ref 5–15)
BUN: 7 mg/dL (ref 6–20)
CO2: 23 mmol/L (ref 22–32)
Calcium: 9.5 mg/dL (ref 8.9–10.3)
Chloride: 108 mmol/L (ref 101–111)
Creatinine, Ser: 0.74 mg/dL (ref 0.61–1.24)
GLUCOSE: 99 mg/dL (ref 65–99)
Potassium: 3.3 mmol/L — ABNORMAL LOW (ref 3.5–5.1)
Sodium: 141 mmol/L (ref 135–145)
Total Bilirubin: 1.1 mg/dL (ref 0.3–1.2)
Total Protein: 7.7 g/dL (ref 6.5–8.1)

## 2016-12-20 LAB — GLUCOSE, CAPILLARY
GLUCOSE-CAPILLARY: 105 mg/dL — AB (ref 65–99)
Glucose-Capillary: 110 mg/dL — ABNORMAL HIGH (ref 65–99)

## 2016-12-20 LAB — RPR: RPR Ser Ql: NONREACTIVE

## 2016-12-20 LAB — MAGNESIUM: Magnesium: 1.7 mg/dL (ref 1.7–2.4)

## 2016-12-20 LAB — TSH: TSH: 2.477 u[IU]/mL (ref 0.350–4.500)

## 2016-12-20 LAB — VITAMIN B12: VITAMIN B 12: 982 pg/mL — AB (ref 180–914)

## 2016-12-20 LAB — FOLATE: FOLATE: 15.4 ng/mL (ref >5.9)

## 2016-12-20 MED ORDER — LORAZEPAM 2 MG/ML IJ SOLN: 1.0000 mg | INTRAMUSCULAR | Status: DC | PRN

## 2016-12-20 MED ORDER — HALOPERIDOL LACTATE 5 MG/ML IJ SOLN
INTRAMUSCULAR | Status: AC
  Filled 2016-12-20: qty 2

## 2016-12-20 MED ORDER — HYDRALAZINE HCL 25 MG PO TABS
25.0000 mg | ORAL_TABLET | Freq: Three times a day (TID) | ORAL | Status: DC
  Administered 2016-12-20 – 2016-12-22 (×6): 25 mg via ORAL
  Filled 2016-12-20 (×6): qty 1

## 2016-12-20 MED ORDER — POTASSIUM CHLORIDE 10 MEQ/100ML IV SOLN
10.0000 meq | INTRAVENOUS | Status: AC
  Administered 2016-12-20 (×4): 10 meq via INTRAVENOUS
  Filled 2016-12-20 (×3): qty 100

## 2016-12-20 MED ORDER — SODIUM CHLORIDE 0.9 % IV SOLN
0.2000 ug/kg/h | INTRAVENOUS | Status: DC
  Filled 2016-12-20: qty 2

## 2016-12-20 MED ORDER — AMLODIPINE BESYLATE 5 MG PO TABS
5.0000 mg | ORAL_TABLET | Freq: Every day | ORAL | Status: DC
  Administered 2016-12-20 – 2016-12-22 (×3): 5 mg via ORAL
  Filled 2016-12-20 (×3): qty 1

## 2016-12-20 MED ORDER — CLONIDINE HCL 0.2 MG/24HR TD PTWK
0.2000 mg | MEDICATED_PATCH | TRANSDERMAL | Status: DC
  Administered 2016-12-20: 0.2 mg via TRANSDERMAL
  Filled 2016-12-20: qty 1

## 2016-12-20 MED ORDER — MAGNESIUM SULFATE IN D5W 1-5 GM/100ML-% IV SOLN
1.0000 g | Freq: Once | INTRAVENOUS | Status: AC
  Administered 2016-12-20: 1 g via INTRAVENOUS
  Filled 2016-12-20: qty 100

## 2016-12-20 MED ORDER — LORAZEPAM 2 MG/ML IJ SOLN
2.0000 mg | INTRAMUSCULAR | Status: DC | PRN
  Administered 2016-12-20 – 2016-12-21 (×2): 2 mg via INTRAVENOUS
  Filled 2016-12-20 (×2): qty 1

## 2016-12-20 MED ORDER — SODIUM CHLORIDE 0.9 % IV SOLN
0.4000 ug/kg/h | INTRAVENOUS | Status: DC
  Administered 2016-12-20 (×2): 1.7 ug/kg/h via INTRAVENOUS
  Administered 2016-12-21 (×3): 1.8 ug/kg/h via INTRAVENOUS
  Administered 2016-12-21: 0.9 ug/kg/h via INTRAVENOUS
  Administered 2016-12-21: 1.3 ug/kg/h via INTRAVENOUS
  Administered 2016-12-21: 1 ug/kg/h via INTRAVENOUS
  Administered 2016-12-21: 1.2 ug/kg/h via INTRAVENOUS
  Administered 2016-12-22: 0.5 ug/kg/h via INTRAVENOUS
  Filled 2016-12-20 (×11): qty 4

## 2016-12-20 MED ORDER — HALOPERIDOL LACTATE 5 MG/ML IJ SOLN
10.0000 mg | Freq: Once | INTRAMUSCULAR | Status: AC
  Administered 2016-12-20: 10 mg via INTRAVENOUS

## 2016-12-20 MED ORDER — HALOPERIDOL LACTATE 5 MG/ML IJ SOLN: 5.0000 mg | Freq: Four times a day (QID) | INTRAMUSCULAR | Status: DC | PRN

## 2016-12-20 MED ORDER — POTASSIUM CHLORIDE 10 MEQ/100ML IV SOLN
10.0000 meq | INTRAVENOUS | Status: AC
  Administered 2016-12-20 (×2): 10 meq via INTRAVENOUS
  Filled 2016-12-20 (×2): qty 100

## 2016-12-20 MED ORDER — SODIUM CHLORIDE 0.9 % IV SOLN
0.4000 ug/kg/h | INTRAVENOUS | Status: DC
  Administered 2016-12-20: 1.9 ug/kg/h via INTRAVENOUS
  Administered 2016-12-20: 2 ug/kg/h via INTRAVENOUS
  Administered 2016-12-20: 0.4 ug/kg/h via INTRAVENOUS
  Administered 2016-12-20: 2 ug/kg/h via INTRAVENOUS
  Administered 2016-12-20: 1.8 ug/kg/h via INTRAVENOUS
  Administered 2016-12-20: 1.9 ug/kg/h via INTRAVENOUS
  Filled 2016-12-20 (×8): qty 4

## 2016-12-20 NOTE — Progress Notes (Signed)
Around 0330 this morning, patient became increasingly agitated.  Previously easy to redirect behavior, patient becoming more uncooperative with staff.  CIWA increased to 20 (from 11).  Ativan given per PRN order.  No effect, patient more agitated and threatening to staff.  Attempting to get OOB and removing necessary equipment.  Security called to assist patient back into bed.  Haldol 5mg  given per PRN order.  Patient calmed down and was cooperative for a short while, then began attempting to get OOB once again.  MD notified and arrived at bedside for evaluation.  Precedex gtt ordered and initiated.  Patient maxed out on high limits of order at 0.88mcg/kg/hr, continues to be threatening to staff, getting OOB and removing necessary equipment.  Security once again at bedside.  ELink MD notified, Precedex gtt increased to 41mcg/kg/hr, new high limits written by MD.  Haldol 10mg  IV x1 ordered by MD, and administered.  Patient continuing to be argumentative and threatening to staff.  Attempting to get OOB, very unsteady.  Redirected to sit down by security and bedside nurses.  0600 CIWA score 23.  Patient now sleeping about 45 minutes after Precedex dosage change and Haldol administration.  Will continue to monitor.

## 2016-12-20 NOTE — Progress Notes (Signed)
PULMONARY / CRITICAL CARE MEDICINE   Name: Brett Molina MRN: [id number] DOB: 19-Jun-1991    ADMISSION DATE:  12/11/2016 CONSULTATION DATE:  12/13/16  REFERRING MD:  Thurman Coyer MD  CHIEF COMPLAINT:  Alcohol withdrawal requiring precedex  BRIEF SUMMARY:   26 year old with history of alcohol abuse, depression. Admitted with alcohol intoxication, mild ketosis. Went into full blown withdrawal 6/9 and was started on precedex drip, nitro drip for elevated BP PCCM consulted for help with management and taken over while on precedex drip.    SUBJECTIVE:    Transferred back to the ICU overnight for worsening confusion Hypertensive Started on precedex again   VITAL SIGNS: BP (!) 147/89   Pulse 86   Temp 98.2 F (36.8 C) (Axillary)   Resp 16   Ht 6\' 2"  (1.88 m)   Wt 218 lb 0.6 oz (98.9 kg)   SpO2 98%   BMI 27.99 kg/m   HEMODYNAMICS:    VENTILATOR SETTINGS:    INTAKE / OUTPUT: I/O last 3 completed shifts: In: 1898.8 [P.O.:340; I.V.:1358.8; IV Piggyback:200] Out: 1600 [Urine:1600]  PHYSICAL EXAMINATION: General:  Resting comfortably in bed HENT: NCAT OP clear PULM: CTA B, normal effort CV: Tachy, regular, no mgr GI: BS+, soft, nontender MSK: normal bulk and tone Neuro:awake, speaking to me and cooperative, pleasant but confused, not oriented to situation   LABS:  BMET  Recent Labs Lab 12/18/16 0301 12/19/16 0639 12/20/16 0327  NA 140 139 141  K 3.8 3.7 3.3*  CL 107 105 108  CO2 23 22 23   BUN 12 8 7   CREATININE 0.88 0.83 0.74  GLUCOSE 127* 104* 99    Electrolytes  Recent Labs Lab 12/15/16 0410  12/17/16 0316 12/18/16 0301 12/19/16 0639 12/20/16 0327  CALCIUM 8.9  < > 9.4 9.1 9.7 9.5  MG 2.0  < > 1.9 2.0 2.1 1.7  PHOS 3.5  --  4.5 4.2  --   --   < > = values in this interval not displayed.  CBC  Recent Labs Lab 12/15/16 0410 12/16/16 0332 12/19/16 0639  WBC 6.1 6.5 6.7  HGB 12.3* 12.6* 13.1  HCT 36.4* 37.3* 38.3*  PLT 172 193 295     Coag's No results for input(s): APTT, INR in the last 168 hours.  Sepsis Markers No results for input(s): LATICACIDVEN, PROCALCITON, O2SATVEN in the last 168 hours.  ABG No results for input(s): PHART, PCO2ART, PO2ART in the last 168 hours.  Liver Enzymes  Recent Labs Lab 12/14/16 0315 12/20/16 0327  AST 66* 60*  ALT 38 72*  ALKPHOS 49 46  BILITOT 1.3* 1.1  ALBUMIN 4.3 4.4    Cardiac Enzymes  Recent Labs Lab 12/14/16 0315  TROPONINI <0.03    Glucose  Recent Labs Lab 12/14/16 0739 12/15/16 0731 12/16/16 0830 12/18/16 0745 12/19/16 0753 12/20/16 0823  GLUCAP 99 108* 96 101* 93 105*    Imaging No results found.   STUDIES:  Renal US 6/14 >> negative preliminary >> CT Head 6/12 >> negative CT head  CULTURES:   ANTIBIOTICS:   SIGNIFICANT EVENTS: 6/08  Admit with ETOH withdrawal 6/10  Tx to ICU for precedex  6/11  ? Peak of withdrawal 6/12  Improved agitation 6/13  off NTG drip. Still requires IV Sedation therefore remains on PCCM service and in ICU 6/14  haldol added 6/17 brought back to ICU for precedex  LINES/TUBES:  DISCUSSION: 26 year old with history of alcohol abuse, no significant past medical history.  Admitted with DTs requiring ICU  admission and Precedex. Was able to be transitioned out of the ICU on 6/15 but returned again early AM 6/17 requiring precedex  Acute encephalopathy with agitated delirium> most likely EtOH withdrawal, but ddx includes hypertensive encephalopathy (PRES), underlying psychosis Alcohol withdrawal  P: Continue thiamine/folate Continue precedex for now given agitated delirium Continue scheduled valium Prn ativan Agree with MRI brain Fall precautions  Transaminitis > intially with AST/ALT ratio suggestive of EtOH, but now ALT > AST, uncertain cause P: Check acute hepatitis panel Check RUQ ultrasound  Hypertensive urgency> worse 6/17 P: Add amlodipine Continue labetalol Change catapress to  patch Add hydralazine scheduled Prn hydralazine IV F/u MRI brain > PRES?    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  My cc time 35 minutes  Heber [place], MD [facility] Pager: [phone] Cell: [phone] After 3pm or if no response, call [phone]   Canary Brim, NP-C [facility] Pgr: [phone] or if no answer [phone] 12/20/2016, 11:21 AM '

## 2016-12-20 NOTE — Progress Notes (Signed)
Report given to the charge nurse of receiving unit. Pt. Transferred via wheelchair, belongings on his lap cell phone, charger, red shoes, clothes and deodorant @ 0000

## 2016-12-20 NOTE — Progress Notes (Signed)
PT Cancellation Note  Patient Details Name: Brett Molina MRN: [id number] DOB: 1991/06/07   Cancelled Treatment:    Reason Eval/Treat Not Completed: Medical issues which prohibited therapy; pt now in ICU/agitated/elevated BP 192/120; defer today and check back as schedule permits and medical issues allow   [facility] 12/20/2016, 10:25 AM

## 2016-12-20 NOTE — Progress Notes (Signed)
Patient seen and examined on floor due to increasing agitation, AMS.  Having visual and auditory hallucinations.  Now is completely disoriented.  Thinks that he is "somewhere underground".  This despite 5 halidol, ativan.  Tachy to 120s-130s, BP 190 systolic.  Spoke with Dr. Darrick Penna, she agrees with precedex.  Clinically appears consistent with DTs, however this is hospital day 8 and he seemed to already be over DTs so not clear why he would be going back in to them.  1) Precedex gtt per Dr. Darrick Penna 2) MRI brain ordered and pending 3) Continue CIWA 4) UDS ordered and pending 5) 2 runs IV K ordered for K of 3.3 this AM.  Mild LFT elevations also noted, but actually this is pretty similar to previous LFTs this admit.

## 2016-12-20 NOTE — Progress Notes (Addendum)
PROGRESS NOTE  Brett Molina HQI:[id number] DOB: 01/28/1991 DOA: 12/11/2016 PCP: Patient, No Pcp Per   Brief Summary:  Brett Molina is a 26 y.o. male with medical history significant for alcohol abuse, depression. Patient presented to ED on 6/8 for evaluation of depression and alcoholism but per psych eval in ED, pt at risk for withdrawals so not stable to come to [facility]. Patient has no suicidal ideations at this time, no hallucinations. No chest pain, shortness of breath or palpitations. No abdominal pain, nausea or vomiting.  Patient Went into full blown withdrawal 6/9 and was extremely confused and agitated, he is started on precedex drip, nitro drip for elevated BP He is transferred to icu under critical care service , he received prn ativan, haldol  While in the ICU, he is weaned off precedex drip on 6/15, and transferred back to hospitalist service on 6/16 am.  Patient had severe agitation on 6/16night, have to be transfer back to iuc and started back on precedex   HPI/Recap of past 24 hours:  He had severe agitation last night, have to be transfer back to iuc and started back on precedex This am, he is still very confused and agitated,     Assessment/Plan: Principal Problem:   Alcoholic intoxication without complication (HCC) Active Problems:   Hypokalemia   Alcoholic hepatitis without ascites   MDD (major depressive disorder)   Alcoholic intoxication ,severe alcohol withdrawal required ICU  Care from 6/9 to 6/15 - --patient is minimizing alcohol use , he reports only started drinking alcohol for a few months, however, I have received past record which showed has was hospitalized for alcohol withdrawal in 07/2016 , he  Reported he started drinking a few months prior to that hospitalization, he apperently also has h/o alcohol withdrawal seizure , he has h/o DUI, need social worker consult  - Alcohol level 493 in ED -Patient Brett Molina into full blown withdrawal 6/9 and was extremely  confused and agitated, he is started on precedex drip, nitro drip for elevated BP He was transferred to icu -he received prn ativan, haldol  While in the ICU, he is weaned off precedex drip on 6/15, and transferred back to hospitalist service pn 6/16.had severe agitation on 6/16night, have to be transfer back to iuc and started back on precedex - Continue ciwa icu protocol, IV fluids, multivitamin, thiamine and folic acid,  -mri brain pending   Significant Peripheral neuropathy with unsteady gait Likely from alcohol use B12/folate unremarkable, rpr pending Start neurontin, titrate to effect Will need  PT once alcohol withdrawal treated  Macrocytosis: mcv 100,. Likely from alcohol use, tsh/b12/folate unremarkable.  Transaminitis: likely Alcoholic hepatitis  - AST> ALT  AST 113 (ALT WNL 56) on admission -Denies n/v, no abdominal pain, ab [place] no acute findings. -hepatitis panel pending  Hypokalemia/hypomagnesemia Likely from alcohol use Continue Replace, keep K>4, mag >2.  HTN;  He initially required nitro drip due to significantly elevated blood pressure Currently on clonidine, labetalol,  need to continue titrate bp meds     Depression/anxiety -He was brought to the ED by his family due to concerning alcohol use/ depression/anxiety which start to affect his life/work  --Currently denies SI/HI - Will consult psych for evaluation   Overweight:  Body mass index is 27.99 kg/m.   DVT prophylaxis: SCD's /lovenox Code Status: full code Family Communication: no family at the bedside Disposition Plan: back to ICU,   Consults called:  Psych Critical care   Procedures:  none  Antibiotics:  none   Objective: BP (!) 151/104   Pulse 69   Temp (!) 96.2 F (35.7 C) (Axillary)   Resp 13   Ht 6\' 2"  (1.88 m)   Wt 98.9 kg (218 lb 0.6 oz)   SpO2 97%   BMI 27.99 kg/m   Intake/Output Summary (Last 24 hours) at 12/20/16 1715 Last data filed at 12/20/16 1545  Gross  per 24 hour  Intake          1270.63 ml  Output             1000 ml  Net           270.63 ml   Filed Weights   12/18/16 0300 12/20/16 0015 12/20/16 0500  Weight: 103.4 kg (228 lb) 98.9 kg (218 lb 0.6 oz) 98.9 kg (218 lb 0.6 oz)    Exam:   General:  Confused, agitated,   Cardiovascular: sinus tachycardia  Respiratory: CTABL  Abdomen: Soft/ND/NT, positive BS  Musculoskeletal: No Edema  Neuro: no focal deficit, Confused, agitated,  Data Reviewed: Basic Metabolic Panel:  Recent Labs Lab 12/14/16 0315 12/15/16 0410 12/16/16 0332 12/17/16 0316 12/18/16 0301 12/19/16 0639 12/20/16 0327  NA 139 140 140 140 140 139 141  K 3.3* 3.7 3.8 3.7 3.8 3.7 3.3*  CL 104 109 110 107 107 105 108  CO2 23 20* 22 23 23 22 23   GLUCOSE 95 102* 110* 102* 127* 104* 99  BUN 7 6 5* 7 12 8 7   CREATININE 0.74 0.65 0.75 0.73 0.88 0.83 0.74  CALCIUM 8.9 8.9 9.3 9.4 9.1 9.7 9.5  MG 1.5* 2.0 1.9 1.9 2.0 2.1 1.7  PHOS 3.5 3.5  --  4.5 4.2  --   --    Liver Function Tests:  Recent Labs Lab 12/14/16 0315 12/20/16 0327  AST 66* 60*  ALT 38 72*  ALKPHOS 49 46  BILITOT 1.3* 1.1  PROT 7.2 7.7  ALBUMIN 4.3 4.4   No results for input(s): LIPASE, AMYLASE in the last 168 hours.  Recent Labs Lab 12/14/16 0315  AMMONIA 17   CBC:  Recent Labs Lab 12/14/16 0315 12/15/16 0410 12/16/16 0332 12/19/16 0639  WBC 7.5 6.1 6.5 6.7  HGB 12.0* 12.3* 12.6* 13.1  HCT 34.4* 36.4* 37.3* 38.3*  MCV 96.9 100.6* 99.7 100.0  PLT 160 172 193 295   Cardiac Enzymes:    Recent Labs Lab 12/14/16 0315  TROPONINI <0.03   BNP (last 3 results) No results for input(s): BNP in the last 8760 hours.  ProBNP (last 3 results) No results for input(s): PROBNP in the last 8760 hours.  CBG:  Recent Labs Lab 12/15/16 0731 12/16/16 0830 12/18/16 0745 12/19/16 0753 12/20/16 0823  GLUCAP 108* 96 101* 93 105*    Recent Results (from the past 240 hour(s))  MRSA PCR Screening     Status: None    Collection Time: 12/12/16  9:52 PM  Result Value Ref Range Status   MRSA by PCR NEGATIVE NEGATIVE Final    Comment:        The GeneXpert MRSA Assay (FDA approved for NASAL specimens only), is one component of a comprehensive MRSA colonization surveillance program. It is not intended to diagnose MRSA infection nor to guide or monitor treatment for MRSA infections.      Studies: US Abdomen Limited Ruq  Result Date: 12/20/2016 CLINICAL DATA:  Elevated LFTs. Alcohol abuse. Question of cirrhosis. EXAM: ULTRASOUND ABDOMEN LIMITED RIGHT UPPER QUADRANT COMPARISON:  None. FINDINGS: Gallbladder: Gallbladder has  a normal appearance. Gallbladder wall is 2.2 mm, within normal limits. No stones or pericholecystic fluid. No sonographic Murphy's sign. Common bile duct: Diameter: 3.7 mm Liver: The liver is echogenic. There is attenuation of the ultrasound wave, poor visualization of the internal hepatic architecture, and loss of definition of the diaphragm. No focal liver lesions are identified. IMPRESSION: Hepatic steatosis.  No evidence for acute cholecystitis. Electronically Signed   By: Norva Pavlov M.D.   On: 12/20/2016 15:16    Scheduled Meds: . amLODipine  5 mg Oral Daily  . cloNIDine  0.2 mg Transdermal Weekly  . diazepam  2 mg Oral Q8H  . enoxaparin (LOVENOX) injection  40 mg Subcutaneous Q24H  . folic acid  1 mg Oral Daily  . gabapentin  100 mg Oral QHS  . hydrALAZINE  25 mg Oral Q8H  . labetalol  300 mg Oral BID  . multivitamin with minerals  1 tablet Oral Daily  . sodium chloride flush  3 mL Intravenous Q12H  . thiamine  100 mg Oral Daily   Or  . thiamine  100 mg Intravenous Daily    Continuous Infusions: . sodium chloride 75 mL/hr at 12/20/16 0500  . dexmedetomidine (PRECEDEX) IV infusion 1.7 mcg/kg/hr (12/20/16 1658)  . magnesium sulfate 1 - 4 g bolus IVPB    . potassium chloride       Time spent:  Brett Villena MD, PhD  [facility] Pager [phone]. If  7PM-7AM, please contact night-coverage at [email or web address], password [facility] 12/20/2016, 5:15 PM  LOS: 8 days

## 2016-12-20 NOTE — Progress Notes (Signed)
eLink Physician-Brief Progress Note Patient Name: Demoni Haenel DOB: 17-Aug-1990 MRN: [id number]   Date of Service  12/20/2016  HPI/Events of Note  Call from bedside nurse and Dr. Julian Reil.  Requesting re-consult on patient who had been treated previously with precedex for ETOH withdrawal.  Patient with visual and auditory hallucinations along with agitation.  HD stable but tachy and hypertensive.  Concern for other etiology for confusion.  eICU Interventions  Plan; MRI ordered by primary team precedex for now UDS Haldol EKG to evaluate QTc To be seen by PCCM in AM     Intervention Category Major Interventions: Delirium, psychosis, severe agitation - evaluation and management  DETERDING,ELIZABETH 12/20/2016, 5:26 AM

## 2016-12-21 LAB — COMPREHENSIVE METABOLIC PANEL
ALBUMIN: 4.3 g/dL (ref 3.5–5.0)
ALT: 63 U/L (ref 17–63)
AST: 45 U/L — AB (ref 15–41)
Alkaline Phosphatase: 48 U/L (ref 38–126)
Anion gap: 9 (ref 5–15)
BILIRUBIN TOTAL: 0.5 mg/dL (ref 0.3–1.2)
BUN: 6 mg/dL (ref 6–20)
CO2: 21 mmol/L — ABNORMAL LOW (ref 22–32)
CREATININE: 0.61 mg/dL (ref 0.61–1.24)
Calcium: 9.7 mg/dL (ref 8.9–10.3)
Chloride: 111 mmol/L (ref 101–111)
GFR calc Af Amer: >60 mL/min (ref >60)
GFR calc non Af Amer: >60 mL/min (ref >60)
GLUCOSE: 104 mg/dL — AB (ref 65–99)
POTASSIUM: 3.9 mmol/L (ref 3.5–5.1)
Sodium: 141 mmol/L (ref 135–145)
TOTAL PROTEIN: 7.8 g/dL (ref 6.5–8.1)

## 2016-12-21 LAB — MAGNESIUM: Magnesium: 2.1 mg/dL (ref 1.7–2.4)

## 2016-12-21 LAB — HEMOGLOBIN A1C
Hgb A1c MFr Bld: 5.3 % (ref 4.8–5.6)
Mean Plasma Glucose: 105 mg/dL

## 2016-12-21 LAB — FOLATE RBC
Folate, Hemolysate: 267.4 ng/mL
Folate, RBC: 670 ng/mL (ref >498)
HEMATOCRIT: 39.9 % (ref 37.5–51.0)

## 2016-12-21 LAB — LIPASE, BLOOD: Lipase: 49 U/L (ref 11–51)

## 2016-12-21 LAB — HEPATITIS PANEL, ACUTE
HCV Ab: <0.1 s/co ratio (ref 0.0–0.9)
HEP B C IGM: NEGATIVE
Hep A IgM: NEGATIVE
Hepatitis B Surface Ag: NEGATIVE

## 2016-12-21 LAB — GLUCOSE, CAPILLARY: Glucose-Capillary: 98 mg/dL (ref 65–99)

## 2016-12-21 LAB — AMMONIA: Ammonia: 23 umol/L (ref 9–35)

## 2016-12-21 MED ORDER — DIAZEPAM 2 MG PO TABS
1.0000 mg | ORAL_TABLET | Freq: Two times a day (BID) | ORAL | Status: DC | PRN
  Administered 2016-12-22: 1 mg via ORAL
  Filled 2016-12-21: qty 1

## 2016-12-21 MED ORDER — LIP MEDEX EX OINT
TOPICAL_OINTMENT | CUTANEOUS | Status: AC
  Administered 2016-12-21: 09:00:00
  Filled 2016-12-21: qty 7

## 2016-12-21 MED ORDER — QUETIAPINE FUMARATE 50 MG PO TABS
50.0000 mg | ORAL_TABLET | Freq: Two times a day (BID) | ORAL | Status: DC
  Administered 2016-12-21 – 2016-12-22 (×3): 50 mg via ORAL
  Filled 2016-12-21 (×3): qty 1

## 2016-12-21 NOTE — Progress Notes (Signed)
Patient is now back on high dose precedex drip,care is under critical care,  hospitalist sign off.

## 2016-12-21 NOTE — Progress Notes (Signed)
PT Cancellation Note  Patient Details Name: Brett Molina MRN: [id number] DOB: [birth year]   Cancelled Treatment:    Reason Eval/Treat Not Completed: Medical issues which prohibited therapy-currently on precedex, has elevated BP, and low temp. Will hold PT and check back another day.    Rebeca Alert, MPT Pager: [phone]

## 2016-12-21 NOTE — Progress Notes (Signed)
LCSW aware of consult for substance abuse: ETOH.  Patient seen in [facility] ED by TTS with recommendations as follows:  June 8th 2018 Per Larey Seat NP, patient meets criteria for INPT treatment. Patient accepted to [facility]; Room #300-2. Nursing report [phone].  Patient is voluntary. Support paperwork has been completed. Patient to be transported to [facility] via [facility].  Cousin provides collateral info. She reports she and pt's close relatives are concerned that pt may try to harm himself. She states that pt often mentions wanting to fade away and not exist. She says he often reports SI. Cousin says that he was admitted to an ED this past Jan for dehydration due to excessive alcohol use. She reports pt's boss told him that if he doesn't get help for his drinking, then he will be fired.    Due to medical conditions patient was admitted to ICU and currently still medically being treated.  Patient will need re-evaluation by Psych/psych consult to determine if patient still meets criteria for placement. At this time, patient currently voluntary.  LCSW will follow acutely for needs and resources as patient improves.  Deretha Emory, MSW Clinical Social Work: Optician, dispensing Coverage for :  [phone]

## 2016-12-21 NOTE — Progress Notes (Signed)
Date:  December 21, 2016  Chart reviewed for concurrent status and case management needs.  Will continue to follow patient progress.  Discharge Planning: following for needs  Expected discharge date: [id number]  Maniya Donovan, BSN, RN3, CCM   [phone]  

## 2016-12-21 NOTE — Progress Notes (Signed)
PULMONARY / CRITICAL CARE MEDICINE   Name: Brett Molina MRN: [id number] DOB: 05-01-1991    ADMISSION DATE:  12/11/2016 CONSULTATION DATE:  12/13/16  REFERRING MD:  Thurman Coyer MD  CHIEF COMPLAINT:  Alcohol withdrawal requiring precedex  BRIEF SUMMARY:   26 year old with history of alcohol abuse, depression. Admitted with alcohol intoxication, mild ketosis. Went into full blown withdrawal 6/9 and was started on precedex drip, nitro drip for elevated BP PCCM consulted for help with management and taken over while on precedex drip.    SUBJECTIVE:   Still impulsive   VITAL SIGNS: BP (!) 149/107   Pulse 85   Temp (!) 96.3 F (35.7 C) (Axillary)   Resp 16   Ht 6\' 2"  (1.88 m)   Wt 218 lb 7.6 oz (99.1 kg)   SpO2 99%   BMI 28.05 kg/m      INTAKE / OUTPUT:  Intake/Output Summary (Last 24 hours) at 12/21/16 4098 Last data filed at 12/21/16 0800  Gross per 24 hour  Intake          2750.22 ml  Output             3300 ml  Net          -549.78 ml   General appearance:  26 Year old  Male, well nourished NAD,  conversant  Eyes: anicteric sclerae, moist conjunctivae; PERRL, EOMI bilaterally. Mouth:  membranes and no mucosal ulcerations; Neck: Trachea midline; neck supple, no JVD Lungs/chest: CTA, with normal respiratory effort and no intercostal retractions CV: RRR, no MRGs  Abdomen: Soft, non-tender; no masses or HSM Extremities: No peripheral edema or extremity lymphadenopathy Skin: Normal temperature, turgor and texture; no rash, ulcers or subcutaneous nodules Neuro/Psych: Appropriate affect, alert and oriented to person, place and time, at times acutely impulsive and anxious    LABS:  BMET  Recent Labs Lab 12/19/16 0639 12/20/16 0327 12/21/16 0255  NA 139 141 141  K 3.7 3.3* 3.9  CL 105 108 111  CO2 22 23 21*  BUN 8 7 6   CREATININE 0.83 0.74 0.61  GLUCOSE 104* 99 104*    Electrolytes  Recent Labs Lab 12/15/16 0410  12/17/16 0316 12/18/16 0301 12/19/16 0639  12/20/16 0327 12/21/16 0255  CALCIUM 8.9  < > 9.4 9.1 9.7 9.5 9.7  MG 2.0  < > 1.9 2.0 2.1 1.7 2.1  PHOS 3.5  --  4.5 4.2  --   --   --   < > = values in this interval not displayed.  CBC  Recent Labs Lab 12/15/16 0410 12/16/16 0332 12/19/16 0639  WBC 6.1 6.5 6.7  HGB 12.3* 12.6* 13.1  HCT 36.4* 37.3* 38.3*  PLT 172 193 295    Coag's No results for input(s): APTT, INR in the last 168 hours.  Sepsis Markers No results for input(s): LATICACIDVEN, PROCALCITON, O2SATVEN in the last 168 hours.  ABG No results for input(s): PHART, PCO2ART, PO2ART in the last 168 hours.  Liver Enzymes  Recent Labs Lab 12/20/16 0327 12/21/16 0255  AST 60* 45*  ALT 72* 63  ALKPHOS 46 48  BILITOT 1.1 0.5  ALBUMIN 4.4 4.3    Cardiac Enzymes No results for input(s): TROPONINI, PROBNP in the last 168 hours.  Glucose  Recent Labs Lab 12/16/16 0830 12/18/16 0745 12/19/16 0753 12/20/16 0823 12/20/16 2317 12/21/16 0737  GLUCAP 96 101* 93 105* 110* 98    Imaging US Abdomen Limited Ruq  Result Date: 12/20/2016 CLINICAL DATA:  Elevated LFTs. Alcohol  abuse. Question of cirrhosis. EXAM: ULTRASOUND ABDOMEN LIMITED RIGHT UPPER QUADRANT COMPARISON:  None. FINDINGS: Gallbladder: Gallbladder has a normal appearance. Gallbladder wall is 2.2 mm, within normal limits. No stones or pericholecystic fluid. No sonographic Murphy's sign. Common bile duct: Diameter: 3.7 mm Liver: The liver is echogenic. There is attenuation of the ultrasound wave, poor visualization of the internal hepatic architecture, and loss of definition of the diaphragm. No focal liver lesions are identified. IMPRESSION: Hepatic steatosis.  No evidence for acute cholecystitis. Electronically Signed   By: Norva Pavlov M.D.   On: 12/20/2016 15:16     STUDIES:  Renal US 6/14: Hepatic steatosis.  No evidence for acute cholecystitis. CT Head 6/12 >> negative CT head  CULTURES:   ANTIBIOTICS:   SIGNIFICANT EVENTS: 6/08   Admit with ETOH withdrawal 6/10  Tx to ICU for precedex  6/11  ? Peak of withdrawal 6/12  Improved agitation 6/13  off NTG drip. Still requires IV Sedation therefore remains on PCCM service and in ICU 6/14  haldol added 6/17 brought back to ICU for precedex 6/18 having hallucinations. Changing focus from wd to treating delirium. Added seroquel   LINES/TUBES:  DISCUSSION: 26 year old with history of alcohol abuse, no significant past medical history.  Admitted with DTs requiring ICU admission and Precedex. Was able to be transitioned out of the ICU on 6/15 but returned again early AM 6/17 requiring precedex 6/18 Symptoms seem a little worse usually after benzo. ? Could he simply be having adverse reaction to the benzos?? For today we will change valium to PRN, stop ativan, add low dose seroquel and attempt to wean the precedex off.   Acute encephalopathy with agitated delirium  We are out  Plan Cont precedex cont clonidine patch Cont thiamine and folate  Will rapidly transition OFF benzos w/ focus on treated delirium (exacerbated by the benzos??) Add seroquel  MRI pending (would not sedate him just to get this)  Transaminitis > intially with AST/ALT ratio suggestive of EtOH, but now ALT > AST, uncertain cause Fatty liver by [place] -[place] w/out stones.  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

## 2016-12-22 LAB — HEPATITIS PANEL, ACUTE
HCV Ab: <0.1 s/co ratio (ref 0.0–0.9)
HEP A IGM: NEGATIVE
Hep B C IgM: NEGATIVE
Hepatitis B Surface Ag: NEGATIVE

## 2016-12-22 LAB — METHYLMALONIC ACID, SERUM: Methylmalonic Acid, Quantitative: 135 nmol/L (ref 0–378)

## 2016-12-22 LAB — BASIC METABOLIC PANEL
Anion gap: 7 (ref 5–15)
BUN: 9 mg/dL (ref 6–20)
CALCIUM: 9.2 mg/dL (ref 8.9–10.3)
CO2: 24 mmol/L (ref 22–32)
CREATININE: 0.85 mg/dL (ref 0.61–1.24)
Chloride: 110 mmol/L (ref 101–111)
GFR calc non Af Amer: >60 mL/min (ref >60)
Glucose, Bld: 96 mg/dL (ref 65–99)
Potassium: 3.5 mmol/L (ref 3.5–5.1)
SODIUM: 141 mmol/L (ref 135–145)

## 2016-12-22 LAB — GLUCOSE, CAPILLARY: Glucose-Capillary: 83 mg/dL (ref 65–99)

## 2016-12-22 MED ORDER — LORAZEPAM 1 MG PO TABS
2.0000 mg | ORAL_TABLET | Freq: Once | ORAL | Status: AC
  Administered 2016-12-22: 2 mg via ORAL
  Filled 2016-12-22: qty 2

## 2016-12-22 MED ORDER — HYDRALAZINE HCL 50 MG PO TABS
50.0000 mg | ORAL_TABLET | Freq: Three times a day (TID) | ORAL | Status: DC
  Administered 2016-12-22 – 2016-12-23 (×3): 50 mg via ORAL
  Filled 2016-12-22 (×3): qty 1

## 2016-12-22 MED ORDER — AMLODIPINE BESYLATE 10 MG PO TABS
10.0000 mg | ORAL_TABLET | Freq: Every day | ORAL | Status: DC
  Administered 2016-12-23 – 2016-12-29 (×7): 10 mg via ORAL
  Filled 2016-12-22 (×7): qty 1

## 2016-12-22 MED ORDER — POTASSIUM CHLORIDE CRYS ER 20 MEQ PO TBCR
40.0000 meq | EXTENDED_RELEASE_TABLET | Freq: Once | ORAL | Status: AC
  Administered 2016-12-22: 40 meq via ORAL
  Filled 2016-12-22: qty 2

## 2016-12-22 MED ORDER — QUETIAPINE FUMARATE 25 MG PO TABS
25.0000 mg | ORAL_TABLET | Freq: Two times a day (BID) | ORAL | Status: DC
  Administered 2016-12-22 – 2016-12-23 (×3): 25 mg via ORAL
  Filled 2016-12-22 (×3): qty 1

## 2016-12-22 NOTE — Progress Notes (Signed)
eLink Physician-Brief Progress Note Patient Name: Brett Molina DOB: October 25, 1990 MRN: [id number]   Date of Service  12/22/2016  HPI/Events of Note  Still having withdrawal symptoms - anxiety and hypertension. BP = 187/123. Marland Kitchen  eICU Interventions  Will order: 1. Ativan 2 mg PO now.      Intervention Category Major Interventions: Delirium, psychosis, severe agitation - evaluation and management  Sommer,Steven Eugene 12/22/2016, 11:16 PM

## 2016-12-22 NOTE — Progress Notes (Signed)
Pt transferred to the floor from ICU. Pts assessment remains unchanged from this morning nurses assessment.

## 2016-12-22 NOTE — Progress Notes (Signed)
Nutrition Brief Note  Patient identified for LOS (day 10).  Wt Readings from Last 15 Encounters:  12/22/16 218 lb 11.1 oz (99.2 kg)    Body mass index is 28.08 kg/m. Patient meets criteria for overweight based on current BMI. Per chart review, pt weighed 232 lbs on 07/30/16 at [facility]. Based on current weight, he has lost 14 lbs (6% body weight) in the past 5 months; this is not significant for time frame. Skin WDL. Pt admitted for alcohol abuse and depression after the recent death of his mom. He was intoxicated on admission and went into full blown withdrawal on 6/9. Pt was SDU at that time and having associated DTs and required Precedex drip. He was transferred back to the floor on 6/15 before being returned to SDU on 6/17 for what is now thought be adverse reaction to Benzos (specifically: Ativan). Pt transferred to [address] from SDU today.   Current diet order is Regular and intakes have been variable during hospitalization d/t medical course, mentation. Labs and medications reviewed. No nutrition interventions warranted at this time. If nutrition issues arise, please consult RD.     Trenton Gammon, MS, RD, LDN, [facility] Inpatient Clinical Dietitian Pager # [phone] After hours/weekend pager # [phone]

## 2016-12-22 NOTE — Clinical Social Work Note (Signed)
Clinical Social Work Assessment  Patient Details  Name: Brett Molina MRN: [id number] Date of Birth: December 13, 1990  Date of referral:  12/22/16               Reason for consult:  Substance Use/ETOH Abuse                Permission sought to share information with:    Permission granted to share information::     Name::        Agency::     Relationship::     Contact Information:     Housing/Transportation Living arrangements for the past 2 months: Single Family Home Source of Information:  Patient Patient Interpreter Needed:  None Criminal Activity/Legal Involvement Pertinent to Current Situation/Hospitalization:  No - Comment as needed Significant Relationships:  None Lives with:  Relatives, Siblings Do you feel safe going back to the place where you live?  Yes Need for family participation in patient care:  No. Patient is independent.   Care giving concerns: Patient has medical history significant for alcohol abuse and depression. Patient was given a psych eval in ED and was not stable to transfer to behavioral health.     Social Worker assessment / plan:  CSW met with patient at bedside, explain role and reason for visit. Patient oriented x4 and agreeable to assessment. Patient report he has been drinking alcohol since the age 41. Patient reports he use to causally drink with his friends. Patient reports at age 75 he was charged with DUI and licensed was revoked, he is to get license back this year. Patient reports loosing his license cause him to be depressed and drink more. "All I did was sit around the house drinking when I was not at work." Patient reports drinking a fifth of liquor at one time.  Patient reports since his mother died in 07-26-2016, he has been drinking heavily. "She died and I just found myself drinking more to cope with her death." Patient reports he has not seen a counselor/ therapist and is not prescribed any medications.  Patient reports he would like to  go to follow up at behavioral health to continue to treatment for his depression. Patient denies feeling SI/HI. CSW will follow up with physician for psychiatric consult.  CSW provided patient SA resources for residential/outpatient treatment. Patient prefers outpatient due to work. CSW discussed Alcoholic Anonymous groups in the area and support groups. Patient reports understanding.  Plan: -Psych consult to help determine disposition.   Employment status:  [employer] information:  Other (Comment Required) Psychologist, counselling) PT Recommendations:  Home with [place] / Referral to community resources:  Outpatient Psychiatric Care (Comment Required), Outpatient Substance Abuse Treatment Options  Patient/Family's Response to care:  Patient appreciative of CSW services.   Patient/Family's Understanding of and Emotional Response to Diagnosis, Current Treatment, and Prognosis: " No family at bedside.  Patient reports he live at home with his brother and cousin, they have been his support system. Patient would like to go to [facility] for treatment at this time.  Emotional Assessment Appearance:    Attitude/Demeanor/Rapport:    Affect (typically observed):    Orientation:  Oriented to Self, Oriented to Place, Oriented to  Time, Oriented to Situation Alcohol / Substance use:  Alcohol Use Psych involvement (Current and /or in the community):  No (Comment)  Discharge Needs  Concerns to be addressed:  Substance Abuse Concerns, Coping/Stress Concerns Readmission within the last 30 days:  No Current discharge  risk:  Substance Abuse Barriers to Discharge:  Continued Medical Work up   [employer], LCSW 12/22/2016, 4:35 PM

## 2016-12-22 NOTE — Progress Notes (Signed)
PULMONARY / CRITICAL CARE MEDICINE   Name: Brett Molina MRN: [id number] DOB: [birth year]    ADMISSION DATE:  12/11/2016 CONSULTATION DATE:  12/13/16  REFERRING MD:  Thurman Coyer MD  CHIEF COMPLAINT:  Alcohol withdrawal requiring precedex  BRIEF SUMMARY:   26 year old with history of alcohol abuse, depression. Admitted with alcohol intoxication, mild ketosis. Went into full blown withdrawal 6/9 and was started on precedex drip, nitro drip for elevated BP PCCM consulted for help with management and taken over while on precedex drip.    SUBJECTIVE:   Back to baseline Feels well.   VITAL SIGNS: BP (!) 154/100   Pulse 85   Temp 98.7 F (37.1 C) (Oral)   Resp 15   Ht 6\' 2"  (1.88 m)   Wt 218 lb 11.1 oz (99.2 kg)   SpO2 98%   BMI 28.08 kg/m      INTAKE / OUTPUT:  Intake/Output Summary (Last 24 hours) at 12/22/16 0904 Last data filed at 12/22/16 0700  Gross per 24 hour  Intake          2576.41 ml  Output             2500 ml  Net            76.41 ml   General appearance:  26 Year old  Male, well nourished NAD, conversant  Eyes: anicteric sclerae, moist conjunctivae; PERRL, EOMI bilaterally. Mouth:  membranes and no mucosal ulcerations; normal hard and soft palate Neck: Trachea midline; neck supple, no JVD Lungs/chest: CTA, with normal respiratory effort and no intercostal retractions CV: RRR, no MRGs  Abdomen: Soft, non-tender; no masses or HSM Extremities: No peripheral edema or extremity lymphadenopathy Skin: Normal temperature, turgor and texture; no rash, ulcers or subcutaneous nodules Neuro/Psych: Appropriate affect, alert and oriented to person, place and time  LABS:  BMET  Recent Labs Lab 12/20/16 0327 12/21/16 0255 12/22/16 0315  NA 141 141 141  K 3.3* 3.9 3.5  CL 108 111 110  CO2 23 21* 24  BUN 7 6 9   CREATININE 0.74 0.61 0.85  GLUCOSE 99 104* 96    Electrolytes  Recent Labs Lab 12/17/16 0316 12/18/16 0301 12/19/16 0639 12/20/16 0327  12/21/16 0255 12/22/16 0315  CALCIUM 9.4 9.1 9.7 9.5 9.7 9.2  MG 1.9 2.0 2.1 1.7 2.1  --   PHOS 4.5 4.2  --   --   --   --     CBC  Recent Labs Lab 12/16/16 0332 12/19/16 0639 12/20/16 1220  WBC 6.5 6.7  --   HGB 12.6* 13.1  --   HCT 37.3* 38.3* 39.9  PLT 193 295  --     Coag's No results for input(s): APTT, INR in the last 168 hours.  Sepsis Markers No results for input(s): LATICACIDVEN, PROCALCITON, O2SATVEN in the last 168 hours.  ABG No results for input(s): PHART, PCO2ART, PO2ART in the last 168 hours.  Liver Enzymes  Recent Labs Lab 12/20/16 0327 12/21/16 0255  AST 60* 45*  ALT 72* 63  ALKPHOS 46 48  BILITOT 1.1 0.5  ALBUMIN 4.4 4.3    Cardiac Enzymes No results for input(s): TROPONINI, PROBNP in the last 168 hours.  Glucose  Recent Labs Lab 12/18/16 0745 12/19/16 0753 12/20/16 0823 12/20/16 2317 12/21/16 0737 12/22/16 0748  GLUCAP 101* 93 105* 110* 98 83    Imaging No results found.   STUDIES:  Renal US 6/14: Hepatic steatosis.  No evidence for acute cholecystitis. CT Head 6/12 >>  negative CT head  CULTURES:   ANTIBIOTICS:   SIGNIFICANT EVENTS: 6/08  Admit with ETOH withdrawal 6/10  Tx to ICU for precedex  6/11  ? Peak of withdrawal 6/12  Improved agitation 6/13  off NTG drip. Still requires IV Sedation therefore remains on PCCM service and in ICU 6/14  haldol added 6/17 brought back to ICU for precedex 6/18 having hallucinations. Changing focus from wd to treating delirium. Added seroquel   LINES/TUBES:  DISCUSSION: 26 year old with history of alcohol abuse, no significant past medical history.  Admitted with DTs requiring ICU admission and Precedex. Was able to be transitioned out of the ICU on 6/15 but returned again early AM 6/17 requiring precedex 6/19 we stopped all benzos.  He's back to baseline. Suspect he was having adverse response to benzos. Ready to transfer out of ICU. I decreased his seroquel to 25 bid. Would  taper next to Qhs and then to off at time of dc.  Will have triad re-assume his care.   Acute encephalopathy with agitated delirium  Think that this was more a paradoxical effect from the benzos. Today he looks amazing  Plan Off benzos Taper Seroquel  No need to get MRI   Transaminitis > intially with AST/ALT ratio suggestive of EtOH, but now ALT > AST, uncertain cause Fatty liver by [place] -[place] w/out stones.  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

## 2016-12-23 MED ORDER — HYDRALAZINE HCL 20 MG/ML IJ SOLN
10.0000 mg | Freq: Four times a day (QID) | INTRAMUSCULAR | Status: DC | PRN
  Filled 2016-12-23: qty 0.5

## 2016-12-23 MED ORDER — HYDRALAZINE HCL 50 MG PO TABS
100.0000 mg | ORAL_TABLET | Freq: Three times a day (TID) | ORAL | Status: DC
  Administered 2016-12-23 – 2016-12-24 (×3): 100 mg via ORAL
  Filled 2016-12-23 (×3): qty 2

## 2016-12-23 MED ORDER — HYDRALAZINE HCL 20 MG/ML IJ SOLN
20.0000 mg | Freq: Four times a day (QID) | INTRAMUSCULAR | Status: DC | PRN
  Filled 2016-12-23: qty 1

## 2016-12-23 MED ORDER — LABETALOL HCL 200 MG PO TABS
400.0000 mg | ORAL_TABLET | Freq: Two times a day (BID) | ORAL | Status: DC
  Administered 2016-12-23 – 2016-12-24 (×3): 400 mg via ORAL
  Filled 2016-12-23 (×3): qty 2

## 2016-12-23 NOTE — Progress Notes (Signed)
Pt is awake in room, cooperative and calm but does exhibit tremors. No PRNS available at this time to give. Assessment unchanged from previous documentation. Will continue to monitor at this time.

## 2016-12-23 NOTE — Progress Notes (Signed)
Patient ID: Brett Molina, male   DOB: Aug 15, 1990, 26 y.o.   MRN: [id number]  PROGRESS NOTE    Brett Molina  WUJ:[id number] DOB: 04/02/1991 DOA: 12/11/2016 PCP: Patient, No Pcp Per   Brief Narrative:  26 y.o.malewith medical history significant for alcohol abuse, depression. Patient presented to ED on 6/8 for evaluation of depression and alcoholism but per psych eval in ED, pt at risk for withdrawals so not stable to come to [facility].   Patient went into full blown withdrawal on 6/9 and was extremely confused and agitated, he was started on precedex drip, nitro drip for elevated BP He was transferred to icu under critical care service , he received prn ativan, haldol  While in the ICU, he was weaned off precedex drip on 6/15, and transferred back to hospitalist service on 6/16 am.  Patient had severe agitation on 6/16night, had to be transferred back to iuc and started back on precedex. His condition improved, so was transferred out of ICU on 6/19. Benzos have been discontinued; Seroquel is being tapered. His blood pressure still remains extremely elevated.  Assessment & Plan:   Principal Problem:   Alcoholic intoxication without complication (HCC) Active Problems:   Hypokalemia   Alcoholic hepatitis without ascites   MDD (major depressive disorder)  Acute encephalopathy with agitated delirium  - improving; mental status much improved. Monitor off Benzos; taper Seroquel to off in the next 24-48 hrs.  Alcoholic intoxication ,severe alcohol withdrawal required ICU  Care from 6/9 to 6/15 - Alcohol level 493 in ED -Patient Brett Molina into full blown withdrawal 6/9 and was extremely confused and agitated, he was started on precedex drip, nitro drip for elevated BP He was transferred to icu -he received prn ativan, haldol  While in the ICU, he was weaned off precedex drip on 6/15, and transferred back to hospitalist service pn 6/16.had severe agitation on 6/16night, had to be transferred back to ICU  and started back on precedex - Continue ciwa icu protocol, multivitamin, thiamine and folic acid,  - Transferred out of ICU on 6/19; patient improving; off Precedex  Transaminitis - probably from alcohol abuse; improving. Trend LFTs   Fatty liver by [place] -[place] w/out stones.   Hypertensive urgency -Blood pressure still extremely elevated. Increase labetalol to 400 mg by mouth twice a day. Increase hydralazine 100 mg 3 times a day and use when necessary IV hydralazine. Continue oral amlodipine. Monitor blood pressure.  Macrocytosis- likely related to alcohol use. Trend CBC.   Depression/anxiety -He was brought to the ED by his family due to concerning alcohol use/ depression/anxiety which start to affect his life/work  --Currently denies SI/HI - Consult psych for evaluation   Overweight:  Body mass index is 27.99 kg/m.   DVT prophylaxis: Lovenox Code Status:   full code  Family Communication:  none present at bedside  Disposition Plan: Home versus [facility] in 2-3 days was blood pressure improves  Consultants: Psychiatry; critical care   Procedures: None Antimicrobials: None   Subjective: Patient seen and examined at bedside. He denies any overnight fever, nausea, vomiting, chest pain.  Objective: Vitals:   12/22/16 2135 12/23/16 0508 12/23/16 0749 12/23/16 1030  BP: (!) 187/123 (!) 168/110  (!) 155/110  Pulse:  93    Resp:  20    Temp:  99 F (37.2 C)    TempSrc:  Oral    SpO2:  100%    Weight:   97.3 kg (214 lb 8.1 oz)   Height:  Intake/Output Summary (Last 24 hours) at 12/23/16 1106 Last data filed at 12/23/16 0934  Gross per 24 hour  Intake              240 ml  Output              500 ml  Net             -260 ml   Filed Weights   12/21/16 0455 12/22/16 0333 12/23/16 0749  Weight: 99.1 kg (218 lb 7.6 oz) 99.2 kg (218 lb 11.1 oz) 97.3 kg (214 lb 8.1 oz)    Examination:  General exam: Appears calm and comfortable  Respiratory system: Bilateral  decreased breath sound at bases Cardiovascular system: S1 & S2 heard, RRR.  Gastrointestinal system: Abdomen is nondistended, soft and nontender. Normal bowel sounds heard. Central nervous system: Alert and oriented. No focal neurological deficits. Moving extremities Extremities: No cyanosis, clubbing, edema  Skin: No rashes, lesions or ulcers Psychiatry: Judgement and insight appear normal. Mood & affect appropriate.     Data Reviewed: I have personally reviewed following labs and imaging studies  CBC:  Recent Labs Lab 12/19/16 0639 12/20/16 1220  WBC 6.7  --   HGB 13.1  --   HCT 38.3* 39.9  MCV 100.0  --   PLT 295  --    Basic Metabolic Panel:  Recent Labs Lab 12/17/16 0316 12/18/16 0301 12/19/16 0639 12/20/16 0327 12/21/16 0255 12/22/16 0315  NA 140 140 139 141 141 141  K 3.7 3.8 3.7 3.3* 3.9 3.5  CL 107 107 105 108 111 110  CO2 23 23 22 23  21* 24  GLUCOSE 102* 127* 104* 99 104* 96  BUN 7 12 8 7 6 9   CREATININE 0.73 0.88 0.83 0.74 0.61 0.85  CALCIUM 9.4 9.1 9.7 9.5 9.7 9.2  MG 1.9 2.0 2.1 1.7 2.1  --   PHOS 4.5 4.2  --   --   --   --    GFR: Estimated Creatinine Clearance: 150.4 mL/min (by C-G formula based on SCr of 0.85 mg/dL). Liver Function Tests:  Recent Labs Lab 12/20/16 0327 12/21/16 0255  AST 60* 45*  ALT 72* 63  ALKPHOS 46 48  BILITOT 1.1 0.5  PROT 7.7 7.8  ALBUMIN 4.4 4.3    Recent Labs Lab 12/21/16 0255  LIPASE 49    Recent Labs Lab 12/21/16 0255  AMMONIA 23   Coagulation Profile: No results for input(s): INR, PROTIME in the last 168 hours. Cardiac Enzymes: No results for input(s): CKTOTAL, CKMB, CKMBINDEX, TROPONINI in the last 168 hours. BNP (last 3 results) No results for input(s): PROBNP in the last 8760 hours. HbA1C: No results for input(s): HGBA1C in the last 72 hours. CBG:  Recent Labs Lab 12/19/16 0753 12/20/16 0823 12/20/16 2317 12/21/16 0737 12/22/16 0748  GLUCAP 93 105* 110* 98 83   Lipid Profile: No  results for input(s): CHOL, HDL, LDLCALC, TRIG, CHOLHDL, LDLDIRECT in the last 72 hours. Thyroid Function Tests: No results for input(s): TSH, T4TOTAL, FREET4, T3FREE, THYROIDAB in the last 72 hours. Anemia Panel: No results for input(s): VITAMINB12, FOLATE, FERRITIN, TIBC, IRON, RETICCTPCT in the last 72 hours. Sepsis Labs: No results for input(s): PROCALCITON, LATICACIDVEN in the last 168 hours.  No results found for this or any previous visit (from the past 240 hour(s)).       Radiology Studies: No results found.      Scheduled Meds: . amLODipine  10 mg Oral Daily  .  cloNIDine  0.2 mg Transdermal Weekly  . enoxaparin (LOVENOX) injection  40 mg Subcutaneous Q24H  . folic acid  1 mg Oral Daily  . gabapentin  100 mg Oral QHS  . hydrALAZINE  100 mg Oral Q8H  . labetalol  400 mg Oral BID  . multivitamin with minerals  1 tablet Oral Daily  . QUEtiapine  25 mg Oral BID  . sodium chloride flush  3 mL Intravenous Q12H  . thiamine  100 mg Oral Daily   Continuous Infusions:   LOS: 11 days        Glade Lloyd, MD [facility] Pager [phone]  If 7PM-7AM, please contact night-coverage [email or web address] Password [email or web address] 12/23/2016, 11:06 AM

## 2016-12-23 NOTE — Consult Note (Signed)
[facility] Consult   Reason for Consult: Alcohol abuse with withdrawal Referring Physician:  Dr. Starla Link Patient Identification: Brett Molina MRN:  [id number] Principal Diagnosis: Alcoholic intoxication without complication [facility]) Diagnosis:   Patient Active Problem List   Diagnosis Date Noted  . Alcoholic intoxication without complication ([place]) [K44.010] 12/12/2016  . Hypokalemia [E87.6] 12/12/2016  . Alcoholic hepatitis without ascites [K70.10] 12/12/2016  . MDD (major depressive disorder) [F32.9] 12/12/2016    Total Time spent with patient: 45 minutes  Subjective:   Brett Molina is a 26 y.o. male patient admitted with acute alcohol withdrawal.    HPI: Brett Molina is a 26 years old single male initially admitted to behavioral health Hospital for alcohol detox treatment and later he was transferred to the [facility] for intensive care unit placement and also required ongoing treatment at this time. Patient was initially received Precedex treatment at the ICU and later required nitroglycerin drip for hypertensive urgency. Patient also has acute encephalopathy and delirium on June 17. Patient has been drinking alcohol since he was 26 years old and has been drinking heavily for the last 4 years and has one DUI about 3 and years ago and is driver's license was revoked and hoping to get it back in 2 months. Patient is also reportedly working for a [employer] company and staying with his cousin and brother. Patient reported his family drinks alcohol but mostly a socially. Patient reported his mother passed away in 07/29/2016 secondary to complications of the flu. Patient reportedly increased drinking and also not having any mobility secondary to normal license. Patient is able to cooperate with the inpatient treatment program and received alcohol detox treatment since admitted  on 12/11/2016. Patient stated that he likes to have a follow-up treatments with  the behavioral health Hospital but does not want to be inpatient treatment because he has been missing his work since admitted to the hospital cannot afford to lose working days any longer. Patient does not have a transportation middle of the day before participating in intensive outpatient program and is willing to participate in outpatient psychiatric treatment program at behavioral [place].  Patient denies active symptoms of depression, anxiety, suicidal/homicidal ideation, intention or plans he has no evidence of psychosis.   Past Psychiatric History: Patient has no history of alcohol detox [facility] of psychiatric services in the past.   Risk to Self: Is patient at risk for suicide?: No Risk to Others:   Prior Inpatient Therapy:   Prior Outpatient Therapy:    Past Medical History: History reviewed. No pertinent past medical history. History reviewed. No pertinent surgical history. Family History: No family history on file. Family Psychiatric  History: Patient reported he has a family members who are social drinkers but nobody is abusing or dependent besides. Patient denied family history of mental illness. Patient mother deceased in December 201 secondary to complication of flu. Patient father lives in [place], [place].  Social History:  History  Alcohol Use  . 8.4 oz/week  . 14 Standard drinks or equivalent per week     History  Drug Use No    Social History   Social History  . Marital status: Single    Spouse name: N/A  . Number of children: N/A  . Years of education: N/A   Social History Main Topics  . Smoking status: Never Smoker  . Smokeless tobacco: Never Used  . Alcohol use 8.4 oz/week    14 Standard drinks or  equivalent per week  . Drug use: No  . Sexual activity: Not Asked   Other Topics Concern  . None   Social History Narrative  . None   Additional Social History:    Allergies:  No Known Allergies  Labs:  Results for  orders placed or performed during the hospital encounter of 12/11/16 (from the past 48 hour(s))  Basic metabolic panel     Status: None   Collection Time: 12/22/16  3:15 AM  Result Value Ref Range   Sodium 141 135 - 145 mmol/L   Potassium 3.5 3.5 - 5.1 mmol/L   Chloride 110 101 - 111 mmol/L   CO2 24 22 - 32 mmol/L   Glucose, Bld 96 65 - 99 mg/dL   BUN 9 6 - 20 mg/dL   Creatinine, Ser 0.85 0.61 - 1.24 mg/dL   Calcium 9.2 8.9 - 10.3 mg/dL   GFR calc non Af Amer >60 >60 mL/min   GFR calc Af Amer >60 >60 mL/min    Comment: (NOTE) The eGFR has been calculated using the CKD EPI equation. This calculation has not been validated in all clinical situations. eGFR's persistently <60 mL/min signify possible Chronic Kidney Disease.    Anion gap 7 5 - 15  Glucose, capillary     Status: None   Collection Time: 12/22/16  7:48 AM  Result Value Ref Range   Glucose-Capillary 83 65 - 99 mg/dL   Comment 1 Notify RN    Comment 2 Document in Chart     Current Facility-Administered Medications  Medication Dose Route Frequency Provider Last Rate Last Dose  . acetaminophen (TYLENOL) tablet 650 mg  650 mg Oral Q6H PRN Derrill Center, NP   650 mg at 12/12/16 1800  . alum & mag hydroxide-simeth (MAALOX/MYLANTA) 200-200-20 MG/5ML suspension 30 mL  30 mL Oral Q4H PRN Derrill Center, NP      . amLODipine (NORVASC) tablet 10 mg  10 mg Oral Daily Erick Colace, NP   10 mg at 12/23/16 1012  . cloNIDine (CATAPRES - Dosed in mg/24 hr) patch 0.2 mg  0.2 mg Transdermal Weekly Simonne Maffucci B, MD   0.2 mg at 12/20/16 1309  . enoxaparin (LOVENOX) injection 40 mg  40 mg Subcutaneous Q24H Florencia Reasons, MD   40 mg at 12/22/16 1427  . folic acid (FOLVITE) tablet 1 mg  1 mg Oral Daily Robbie Lis, MD   1 mg at 12/23/16 1012  . gabapentin (NEURONTIN) capsule 100 mg  100 mg Oral QHS Florencia Reasons, MD   100 mg at 12/22/16 2036  . hydrALAZINE (APRESOLINE) injection 10 mg  10 mg Intravenous Q6H PRN Aline August, MD      .  hydrALAZINE (APRESOLINE) tablet 100 mg  100 mg Oral Q8H Alekh, Kshitiz, MD   100 mg at 12/23/16 1030  . labetalol (NORMODYNE) tablet 400 mg  400 mg Oral BID Aline August, MD   400 mg at 12/23/16 1111  . magnesium hydroxide (MILK OF MAGNESIA) suspension 30 mL  30 mL Oral Daily PRN Derrill Center, NP      . multivitamin with minerals tablet 1 tablet  1 tablet Oral Daily Robbie Lis, MD   1 tablet at 12/23/16 1012  . ondansetron (ZOFRAN) tablet 4 mg  4 mg Oral Q6H PRN Robbie Lis, MD       Or  . ondansetron [facility]) injection 4 mg  4 mg Intravenous Q6H PRN Robbie Lis, MD      .  QUEtiapine (SEROQUEL) tablet 25 mg  25 mg Oral BID Erick Colace, NP   25 mg at 12/23/16 1012  . sodium chloride flush (NS) 0.9 % injection 3 mL  3 mL Intravenous Q12H Robbie Lis, MD   3 mL at 12/23/16 1013  . thiamine (VITAMIN B-1) tablet 100 mg  100 mg Oral Daily Robbie Lis, MD   100 mg at 12/23/16 1012    Musculoskeletal: Strength & Muscle Tone: within normal limits Gait & Station: unsteady Patient leans: N/A  Psychiatric Specialty Exam: Physical Exam As per history and physical   ROS Patient has no nausea, vomiting, abdomen pain, shortness of breath and chest pain, has a fine tremors in his extended hands.  No Fever-chills, No Headache, No changes with Vision or hearing, reports vertigo No problems swallowing food or Liquids, No Chest pain, Cough or Shortness of Breath, No Abdominal pain, No Nausea or Vommitting, Bowel movements are regular, No Blood in stool or Urine, No dysuria, No new skin rashes or bruises, No new joints pains-aches,  No new weakness, tingling, numbness in any extremity, No recent weight gain or loss, No polyuria, polydypsia or polyphagia,   A full 10 point Review of Systems was done, except as stated above, all other Review of Systems were negative.  Blood pressure (!) 155/110, pulse 93, temperature 99 F (37.2 C), temperature source Oral, resp. rate 20,  height 6' 2"  (1.88 m), weight 97.3 kg (214 lb 8.1 oz), SpO2 100 %.Body mass index is 27.54 kg/m.  General Appearance: Casual and Fairly Groomed  Eye Contact:  Fair  Speech:  Clear and Coherent  Volume:  Normal  Mood:  Anxious and Depressed  Affect:  Congruent and Depressed  Thought Process:  Goal Directed  Orientation:  Full (Time, Place, and Person)  Thought Content:  Logical  Suicidal Thoughts:  No  Homicidal Thoughts:  No  Memory:  Immediate;   Fair  Judgement:  Fair  Insight:  Fair  Psychomotor Activity:  Increased and Tremor  Concentration:  Concentration: Fair  Recall:  NA  Fund of Knowledge:  Fair  Language:  Fair  Akathisia:  Negative  Handed:  Right  AIMS (if indicated):     Assets:  Communication Skills Desire for Improvement  ADL's:  Intact  Cognition:  WNL  Sleep:      Treatment Plan Summary: 26 years old male with alcohol abuse with intoxication and withdrawal admitted initially at behavioral [place] and then transferred to [facility] long hospital for detox treatment. Patient has a mild symptoms of depression anxiety and hand tremors but no suicidal/homicidal ideation, intention or plans. Patient has no evidence of psychosis.  Alcohol abuse with intoxication Alcohol induced mood disorder Rule out major depressive disorder and grief  Psychiatric evaluation requested for disposition plans  Patient was discussed about inpatient versus intensive outpatient or outpatient treatment   Continue Seroquel 25 mg twice daily, Neurontin 100 mg daily at bedtime  No additional psychotropic medication changes were made during this visit.  Patient stated he cannot afford to lose working days so he prefers to outpatient treatment at behavioral [facility]  Patient has no safety concerns.  Case discussed with the unit CSW and patient will be referred to the outpatient psychiatric medication management and also possibly grief counseling  when medically stable.    Disposition: No evidence of imminent risk to self or others at present.   Patient does not meet criteria for psychiatric inpatient admission. Supportive therapy provided  about ongoing stressors.  Ambrose Finland, MD 12/23/2016 11:31 AM

## 2016-12-24 LAB — BASIC METABOLIC PANEL
ANION GAP: 15 (ref 5–15)
BUN: 11 mg/dL (ref 6–20)
CALCIUM: 10.4 mg/dL — AB (ref 8.9–10.3)
CO2: 21 mmol/L — ABNORMAL LOW (ref 22–32)
Chloride: 105 mmol/L (ref 101–111)
Creatinine, Ser: 1.7 mg/dL — ABNORMAL HIGH (ref 0.61–1.24)
GFR calc Af Amer: >60 mL/min (ref >60)
GFR, EST NON AFRICAN AMERICAN: 53 mL/min — AB (ref >60)
GLUCOSE: 99 mg/dL (ref 65–99)
POTASSIUM: 3.7 mmol/L (ref 3.5–5.1)
SODIUM: 141 mmol/L (ref 135–145)

## 2016-12-24 LAB — CBC WITH DIFFERENTIAL/PLATELET
BASOS ABS: 0 10*3/uL (ref 0.0–0.1)
BASOS PCT: 0 %
EOS PCT: 0 %
Eosinophils Absolute: 0 10*3/uL (ref 0.0–0.7)
HCT: 43.1 % (ref 39.0–52.0)
Hemoglobin: 14.8 g/dL (ref 13.0–17.0)
LYMPHS PCT: 14 %
Lymphs Abs: 1.5 10*3/uL (ref 0.7–4.0)
MCH: 34.6 pg — ABNORMAL HIGH (ref 26.0–34.0)
MCHC: 34.3 g/dL (ref 30.0–36.0)
MCV: 100.7 fL — AB (ref 78.0–100.0)
Monocytes Absolute: 1.3 10*3/uL — ABNORMAL HIGH (ref 0.1–1.0)
Monocytes Relative: 13 %
Neutro Abs: 7.6 10*3/uL (ref 1.7–7.7)
Neutrophils Relative %: 73 %
PLATELETS: 490 10*3/uL — AB (ref 150–400)
RBC: 4.28 MIL/uL (ref 4.22–5.81)
RDW: 13.5 % (ref 11.5–15.5)
WBC: 10.4 10*3/uL (ref 4.0–10.5)

## 2016-12-24 LAB — MAGNESIUM: MAGNESIUM: 2.1 mg/dL (ref 1.7–2.4)

## 2016-12-24 MED ORDER — HYDRALAZINE HCL 50 MG PO TABS
50.0000 mg | ORAL_TABLET | Freq: Three times a day (TID) | ORAL | Status: DC
  Administered 2016-12-24 – 2016-12-29 (×16): 50 mg via ORAL
  Filled 2016-12-24 (×16): qty 1

## 2016-12-24 MED ORDER — QUETIAPINE FUMARATE 25 MG PO TABS: 50.0000 mg | ORAL_TABLET | Freq: Every day | ORAL | Status: DC

## 2016-12-24 MED ORDER — LABETALOL HCL 200 MG PO TABS
200.0000 mg | ORAL_TABLET | Freq: Two times a day (BID) | ORAL | Status: DC
  Administered 2016-12-24 – 2016-12-26 (×4): 200 mg via ORAL
  Filled 2016-12-24 (×4): qty 1

## 2016-12-24 MED ORDER — GABAPENTIN 100 MG PO CAPS
100.0000 mg | ORAL_CAPSULE | Freq: Every day | ORAL | Status: DC
  Administered 2016-12-24 – 2016-12-28 (×5): 100 mg via ORAL
  Filled 2016-12-24 (×5): qty 1

## 2016-12-24 MED ORDER — QUETIAPINE FUMARATE 25 MG PO TABS
25.0000 mg | ORAL_TABLET | Freq: Every day | ORAL | Status: DC
  Administered 2016-12-24: 25 mg via ORAL
  Filled 2016-12-24: qty 1

## 2016-12-24 NOTE — Progress Notes (Signed)
Pt found walking in hallway with his belongings, stated that his ride was outside and he was leaving to go get more clothes. Escorted back to room, reoriented, informed that he can't leave the hospital without being medically cleared as this would mean that he would be leaving against medical advice. Patient agreed to stay in his room. Will continue to monitor at this time.

## 2016-12-24 NOTE — Progress Notes (Signed)
Date:  December 24, 2016 Chart reviewed for concurrent status and case management needs. Will continue to follow patient progress. Remains confused and non combative, can be reoriented , htn still problem Discharge Planning: following for needs Expected discharge date: [id number] Marcelle Smiling, BSN, [place], [place]   [phone]

## 2016-12-24 NOTE — Clinical Social Work Psych Note (Signed)
Clinical Social Worker Psych Service Line Progress Note  Clinical Social Worker: Lia Hopping, LCSW Date/Time: 12/24/2016, 8:58 AM   Review of Patient  Overall Medical Condition:  Not medically stable. Per physician note, Blood pressure still elevated, Increased Labetalol to 432m by mouth daily.    Participation Level:  Active Participation Quality: Appropriate Other Participation Quality:  Calm and Pleasant   Affect: Appropriate Cognitive: Appropriate, Oriented Reaction to Medications/Concerns:   Modes of Intervention: Support, Education  Summary of Progress/Plan at Discharge  Summary of Progress/Plan at Discharge: CSW and psychiatrist  met with patient at bedside. Patient alert and oriented and agreeable to talk. Patient reports he is feeling better and plans to stop drinking completely. Patient reports "I do not want to have to go through this process again." [facility]. CSW explained process and inpatient stay. Patient reports he will need something outpatient due to his work schedule and currently concern about missing several days already.  [facility]. Patient reports he will have to discuss with his job first before committing. CSW dicussed AA groups again and [facility] and other SA treatment options. CSW offered to make appointment. Patient would like to make own appointment at this time.  No other CSW needs identified at this time.  CSW signing off. Please reconsult if needed.   NKathrin Greathouse LLatanya Presser MSW Clinical Social Worker 5E and Psychiatric Service Line [phone]/21/2018  9:10 AM

## 2016-12-24 NOTE — Progress Notes (Signed)
Pt woke up drenched in sweat, pulled out IV, doesn't believe he's in the same room, apparently disoriented. Tremors were visible, pt appeared anxious. Vitals checked and are within range. Hospitalist notified.   12/24/16 0202  Vitals  Temp 99.2 F (37.3 C)  Temp Source Oral  BP 133/72  MAP (mmHg) 90  BP Location Left Arm  BP Method Automatic  Patient Position (if appropriate) Lying  Pulse Rate 90  Pulse Rate Source Dinamap  Resp 16

## 2016-12-24 NOTE — Progress Notes (Signed)
PT Cancellation Note  Patient Details Name: Brett Molina MRN: [id number] DOB: [birth year]   Cancelled Treatment:    Reason Eval/Treat Not Completed: PT screened, no needs identified, will sign off. Observed pt up and mobilizing in room independently. No acute PT needs at this time. Will sign off. Thanks.    Rebeca Alert, MPT Pager: [phone]

## 2016-12-24 NOTE — Progress Notes (Signed)
Patient ID: Brett Molina, male   DOB: Nov 26, 1990, 26 y.o.   MRN: [id number]  PROGRESS NOTE    [name]  QZR:[id number] DOB: 1990/08/06 DOA: 12/11/2016 PCP: Patient, No Pcp Per   Brief Narrative: 26 y.o.malewith medical history significant for alcohol abuse, depression. Patient presented to ED on 6/8 for evaluation of depression and alcoholism but per psych eval in ED, pt at risk for withdrawals so not stable to come to [facility].   Patient went into full blown withdrawal on 6/9 and was extremely confused and agitated, he was started on precedex drip, nitro drip for elevated BP He was transferred to icu under critical care service , he received prn ativan, haldol While in the ICU, he was weaned off precedex drip on 6/15, and transferred back to hospitalist service on 6/16 am.  Patient had severe agitation on 6/16night, had to be transferred back to iuc and started back on precedex. His condition improved, so was transferred out of ICU on 6/19. Benzos have been discontinued; Seroquel is being tapered. His blood pressure has improved with adjustment of his antihypertensive regimen.   Assessment & Plan:   Principal Problem:   Alcoholic intoxication without complication (HCC) Active Problems:   Hypokalemia   Alcoholic hepatitis without ascites   MDD (major depressive disorder)   Acute encephalopathy with agitated delirium - improving; mental status much improved. Monitor off Benzos; taper Seroquel  - Patient was apparently very confused and delirious overnight. We will follow up with further recommendations from psychiatry. Discontinue clonidine patch and gabapentin. We will follow up with psychiatry regarding Seroquel dosing.  Alcoholic intoxication ,severe alcohol withdrawal required ICU Care from 6/9 to 6/15 -Alcohol level 493 in ED -Patient Brett Molina into full blown withdrawal 6/9 and was extremely confused and agitated, he was started on precedex drip, nitro drip for elevated BP He was  transferred to icu -he received prn ativan, haldol While in the ICU, he was weaned off precedex drip on 6/15, and transferred back to hospitalist service pn 6/16.had severe agitation on 6/16night, had to be transferred back to ICU and started back on precedex - Continue ciwa icu protocol, multivitamin, thiamine and folic acid,  - Transferred out of ICU on 6/19; patient improving; off Precedex  Transaminitis - probably from alcohol abuse; improving. Trend LFTs   Fatty liver by [place] -[place] w/out stones.   Hypertensive urgency -Blood pressure improved. Discontinue clonidine patch. Decrease labetalol to 100 minutes by mouth twice a day and hydralazine to 50 mg by mouth 3 times a day. Continue oral amlodipine. Monitor blood pressure.  Macrocytosis- likely related to alcohol use. Trend CBC.   Depression/anxiety -He was brought to the ED by his family due to concerning alcohol use/ depression/anxiety which start to affect his life/work --Currently denies SI/HI - Psych consultation has been appreciated. No need for inpatient behavior health admission.  Overweight:  Body mass index is 27.99 kg/m.   DVT prophylaxis: Lovenox Code Status:   full code  Family Communication:  none present at bedside  Disposition Plan: Home 1-3 days once medically improved  Consultants: Psychiatry; critical care   Procedures: None Antimicrobials: None   Subjective: Patient seen and examined at bedside. He is more awake this morning and denies any current chest pain nausea vomiting fever. As per nursing report, he was more confused, delirious and agitated overnight.  Objective: Vitals:   12/24/16 0533 12/24/16 0741 12/24/16 0957 12/24/16 1126  BP: 136/88 125/63 (!) 148/102 120/67  Pulse: 93 (!) 109 (!) 101  97  Resp: 18 18    Temp: 99.2 F (37.3 C) 98.4 F (36.9 C)    TempSrc: Oral Oral    SpO2: 100% 100%    Weight:      Height:        Intake/Output Summary (Last 24 hours) at  12/24/16 1344 Last data filed at 12/24/16 0920  Gross per 24 hour  Intake              840 ml  Output              950 ml  Net             -110 ml   Filed Weights   12/21/16 0455 12/22/16 0333 12/23/16 0749  Weight: 99.1 kg (218 lb 7.6 oz) 99.2 kg (218 lb 11.1 oz) 97.3 kg (214 lb 8.1 oz)    Examination:  General exam: Appears calm and comfortable  Respiratory system: Bilateral decreased breath sound at bases Cardiovascular system: S1 & S2 heard,Slightly tachycardic  Gastrointestinal system: Abdomen is nondistended, soft and nontender. Normal bowel sounds heard. Central nervous system: Alert and oriented. No focal neurological deficits. Moving extremities Extremities: No cyanosis, clubbing, edema  Skin: No rashes, lesions or ulcers Psychiatry: Judgement and insight appear normal. Mood & affect appropriate.     Data Reviewed: I have personally reviewed following labs and imaging studies  CBC:  Recent Labs Lab 12/19/16 0639 12/20/16 1220 12/24/16 0758  WBC 6.7  --  10.4  NEUTROABS  --   --  7.6  HGB 13.1  --  14.8  HCT 38.3* 39.9 43.1  MCV 100.0  --  100.7*  PLT 295  --  490*   Basic Metabolic Panel:  Recent Labs Lab 12/18/16 0301 12/19/16 0639 12/20/16 0327 12/21/16 0255 12/22/16 0315 12/24/16 0758  NA 140 139 141 141 141 141  K 3.8 3.7 3.3* 3.9 3.5 3.7  CL 107 105 108 111 110 105  CO2 23 22 23  21* 24 21*  GLUCOSE 127* 104* 99 104* 96 99  BUN 12 8 7 6 9 11   CREATININE 0.88 0.83 0.74 0.61 0.85 1.70*  CALCIUM 9.1 9.7 9.5 9.7 9.2 10.4*  MG 2.0 2.1 1.7 2.1  --  2.1  PHOS 4.2  --   --   --   --   --    GFR: Estimated Creatinine Clearance: 75.2 mL/min (A) (by C-G formula based on SCr of 1.7 mg/dL (H)). Liver Function Tests:  Recent Labs Lab 12/20/16 0327 12/21/16 0255  AST 60* 45*  ALT 72* 63  ALKPHOS 46 48  BILITOT 1.1 0.5  PROT 7.7 7.8  ALBUMIN 4.4 4.3    Recent Labs Lab 12/21/16 0255  LIPASE 49    Recent Labs Lab 12/21/16 0255  AMMONIA  23   Coagulation Profile: No results for input(s): INR, PROTIME in the last 168 hours. Cardiac Enzymes: No results for input(s): CKTOTAL, CKMB, CKMBINDEX, TROPONINI in the last 168 hours. BNP (last 3 results) No results for input(s): PROBNP in the last 8760 hours. HbA1C: No results for input(s): HGBA1C in the last 72 hours. CBG:  Recent Labs Lab 12/19/16 0753 12/20/16 0823 12/20/16 2317 12/21/16 0737 12/22/16 0748  GLUCAP 93 105* 110* 98 83   Lipid Profile: No results for input(s): CHOL, HDL, LDLCALC, TRIG, CHOLHDL, LDLDIRECT in the last 72 hours. Thyroid Function Tests: No results for input(s): TSH, T4TOTAL, FREET4, T3FREE, THYROIDAB in the last 72 hours. Anemia Panel: No results for input(s):  VITAMINB12, FOLATE, FERRITIN, TIBC, IRON, RETICCTPCT in the last 72 hours. Sepsis Labs: No results for input(s): PROCALCITON, LATICACIDVEN in the last 168 hours.  No results found for this or any previous visit (from the past 240 hour(s)).       Radiology Studies: No results found.      Scheduled Meds: . amLODipine  10 mg Oral Daily  . enoxaparin (LOVENOX) injection  40 mg Subcutaneous Q24H  . folic acid  1 mg Oral Daily  . hydrALAZINE  50 mg Oral Q8H  . labetalol  200 mg Oral BID  . multivitamin with minerals  1 tablet Oral Daily  . [START ON 12/25/2016] QUEtiapine  50 mg Oral QHS  . sodium chloride flush  3 mL Intravenous Q12H  . thiamine  100 mg Oral Daily   Continuous Infusions:   LOS: 12 days        Glade Lloyd, MD [facility] Pager [phone]  If 7PM-7AM, please contact night-coverage [email or web address] Password [email or web address] 12/24/2016, 1:44 PM

## 2016-12-24 NOTE — Consult Note (Signed)
[place] Psychiatry Consult   Reason for Consult: Alcohol abuse with withdrawal Referring Physician:  Dr. Starla Link Patient Identification: Brett Molina MRN:  [id number] Principal Diagnosis: Alcoholic intoxication without complication [facility]) Diagnosis:   Patient Active Problem List   Diagnosis Date Noted  . Alcoholic intoxication without complication ([place]) [T06.269] 12/12/2016  . Hypokalemia [E87.6] 12/12/2016  . Alcoholic hepatitis without ascites [K70.10] 12/12/2016  . MDD (major depressive disorder) [F32.9] 12/12/2016    Total Time spent with patient: 45 minutes  Subjective:   Brett Molina is a 26 y.o. male patient admitted with acute alcohol withdrawal.    HPI: [name] is a 26 years old single male initially [facility] detox treatment and later he was transferred to the [facility] for intensive care unit placement and also required ongoing treatment at this time. Patient was initially received Precedex treatment at the ICU and later required nitroglycerin drip for hypertensive urgency. Patient also has acute encephalopathy and delirium on June 17. Patient has been drinking alcohol since he was 27 years old and has been drinking heavily for the last 4 years and has one DUI about 3 and years ago and is driver's license was revoked and hoping to get it back in 2 months. Patient is also reportedly working for a [employer] company and staying with his cousin and brother. Patient reported his family drinks alcohol but mostly a socially. Patient reported his mother passed away in 06-25-16 secondary to complications of the flu. Patient reportedly increased drinking and also not having any mobility secondary to normal license. Patient is able to cooperate with the inpatient treatment program and received alcohol detox treatment since admitted  on 12/11/2016. Patient stated that he likes to have a follow-up treatments with  the behavioral health Hospital but does not want to be inpatient treatment because he has been missing his work since admitted to the hospital cannot afford to lose working days any longer. Patient does not have a transportation middle of the day before participating in intensive outpatient program and is willing to participate in outpatient psychiatric treatment program at behavioral [place].  Patient denies active symptoms of depression, anxiety, suicidal/homicidal ideation, intention or plans he has no evidence of psychosis.   Past Psychiatric History: Patient has no history of alcohol detox [facility] of psychiatric services in the past.   12/24/2016: Interval history: Patient has increased confusion, hallucination, restlessness and pulling his tubes getting last night and required to readdress his medication management today. We'll will change his Seroquel 25 mg twice a day to Seroquel 50 mg at bedtime only and he will continue his Neurontin 100 mg at bed time/PRN.  Risk to Self: Is patient at risk for suicide?: No Risk to Others:   Prior Inpatient Therapy:   Prior Outpatient Therapy:    Past Medical History: History reviewed. No pertinent past medical history. History reviewed. No pertinent surgical history. Family History: No family history on file. Family Psychiatric  History: Patient reported he has a family members who are social drinkers but nobody is abusing or dependent besides. Patient denied family history of mental illness. Patient mother deceased in December 201 secondary to complication of flu. Patient father lives in [place], [place].  Social History:  History  Alcohol Use  . 8.4 oz/week  . 14 Standard drinks or equivalent per week     History  Drug Use No    Social History   Social History  .  Marital status: Single    Spouse name: N/A  . Number of children: N/A  . Years of education: N/A   Social History Main Topics  . Smoking status:  Never Smoker  . Smokeless tobacco: Never Used  . Alcohol use 8.4 oz/week    14 Standard drinks or equivalent per week  . Drug use: No  . Sexual activity: Not Asked   Other Topics Concern  . None   Social History Narrative  . None   Additional Social History:    Allergies:  No Known Allergies  Labs:  Results for orders placed or performed during the hospital encounter of 12/11/16 (from the past 48 hour(s))  CBC with Differential/Platelet     Status: Abnormal   Collection Time: 12/24/16  7:58 AM  Result Value Ref Range   WBC 10.4 4.0 - 10.5 K/uL   RBC 4.28 4.22 - 5.81 MIL/uL   Hemoglobin 14.8 13.0 - 17.0 g/dL   HCT 43.1 39.0 - 52.0 %   MCV 100.7 (H) 78.0 - 100.0 fL   MCH 34.6 (H) 26.0 - 34.0 pg   MCHC 34.3 30.0 - 36.0 g/dL   RDW 13.5 11.5 - 15.5 %   Platelets 490 (H) 150 - 400 K/uL   Neutrophils Relative % 73 %   Neutro Abs 7.6 1.7 - 7.7 K/uL   Lymphocytes Relative 14 %   Lymphs Abs 1.5 0.7 - 4.0 K/uL   Monocytes Relative 13 %   Monocytes Absolute 1.3 (H) 0.1 - 1.0 K/uL   Eosinophils Relative 0 %   Eosinophils Absolute 0.0 0.0 - 0.7 K/uL   Basophils Relative 0 %   Basophils Absolute 0.0 0.0 - 0.1 K/uL  Basic metabolic panel     Status: Abnormal   Collection Time: 12/24/16  7:58 AM  Result Value Ref Range   Sodium 141 135 - 145 mmol/L   Potassium 3.7 3.5 - 5.1 mmol/L   Chloride 105 101 - 111 mmol/L   CO2 21 (L) 22 - 32 mmol/L   Glucose, Bld 99 65 - 99 mg/dL   BUN 11 6 - 20 mg/dL   Creatinine, Ser 1.70 (H) 0.61 - 1.24 mg/dL   Calcium 10.4 (H) 8.9 - 10.3 mg/dL   GFR calc non Af Amer 53 (L) >60 mL/min   GFR calc Af Amer >60 >60 mL/min    Comment: (NOTE) The eGFR has been calculated using the CKD EPI equation. This calculation has not been validated in all clinical situations. eGFR's persistently <60 mL/min signify possible Chronic Kidney Disease.    Anion gap 15 5 - 15  Magnesium     Status: None   Collection Time: 12/24/16  7:58 AM  Result Value Ref Range    Magnesium 2.1 1.7 - 2.4 mg/dL    Current Facility-Administered Medications  Medication Dose Route Frequency Provider Last Rate Last Dose  . acetaminophen (TYLENOL) tablet 650 mg  650 mg Oral Q6H PRN Derrill Center, NP   650 mg at 12/12/16 1800  . alum & mag hydroxide-simeth (MAALOX/MYLANTA) 200-200-20 MG/5ML suspension 30 mL  30 mL Oral Q4H PRN Derrill Center, NP      . amLODipine (NORVASC) tablet 10 mg  10 mg Oral Daily Erick Colace, NP   10 mg at 12/24/16 0957  . enoxaparin (LOVENOX) injection 40 mg  40 mg Subcutaneous Q24H Florencia Reasons, MD   40 mg at 12/23/16 1338  . folic acid (FOLVITE) tablet 1 mg  1 mg Oral Daily  Robbie Lis, MD   1 mg at 12/24/16 2637  . hydrALAZINE (APRESOLINE) injection 10 mg  10 mg Intravenous Q6H PRN Alekh, Kshitiz, MD      . hydrALAZINE (APRESOLINE) tablet 50 mg  50 mg Oral Q8H Alekh, Kshitiz, MD      . labetalol (NORMODYNE) tablet 200 mg  200 mg Oral BID Alekh, Kshitiz, MD      . magnesium hydroxide (MILK OF MAGNESIA) suspension 30 mL  30 mL Oral Daily PRN Derrill Center, NP      . multivitamin with minerals tablet 1 tablet  1 tablet Oral Daily Robbie Lis, MD   1 tablet at 12/24/16 0957  . ondansetron (ZOFRAN) tablet 4 mg  4 mg Oral Q6H PRN Robbie Lis, MD       Or  . ondansetron [facility]) injection 4 mg  4 mg Intravenous Q6H PRN Robbie Lis, MD      . Derrill Memo ON 12/25/2016] QUEtiapine (SEROQUEL) tablet 50 mg  50 mg Oral QHS Ambrose Finland, MD      . sodium chloride flush (NS) 0.9 % injection 3 mL  3 mL Intravenous Q12H Robbie Lis, MD   3 mL at 12/24/16 1044  . thiamine (VITAMIN B-1) tablet 100 mg  100 mg Oral Daily Robbie Lis, MD   100 mg at 12/24/16 8588    Musculoskeletal: Strength & Muscle Tone: within normal limits Gait & Station: unsteady Patient leans: N/A  Psychiatric Specialty Exam: Physical Exam As per history and physical   ROS Patient has no nausea, vomiting, abdomen pain, shortness of breath and chest pain, has  a fine tremors in his extended hands.  No Fever-chills, No Headache, No changes with Vision or hearing, reports vertigo No problems swallowing food or Liquids, No Chest pain, Cough or Shortness of Breath, No Abdominal pain, No Nausea or Vommitting, Bowel movements are regular, No Blood in stool or Urine, No dysuria, No new skin rashes or bruises, No new joints pains-aches,  No new weakness, tingling, numbness in any extremity, No recent weight gain or loss, No polyuria, polydypsia or polyphagia,   A full 10 point Review of Systems was done, except as stated above, all other Review of Systems were negative.  Blood pressure 120/67, pulse 97, temperature 98.4 F (36.9 C), temperature source Oral, resp. rate 18, height _0  (1.88 m), weight 97.3 kg (214 lb 8.1 oz), SpO2 100 %.Body mass index is 27.54 kg/m.  General Appearance: Casual and Fairly Groomed  Eye Contact:  Fair  Speech:  Clear and Coherent  Volume:  Normal  Mood:  Anxious and Depressed  Affect:  Congruent and Depressed  Thought Process:  Goal Directed  Orientation:  Full (Time, Place, and Person)  Thought Content:  Logical  Suicidal Thoughts:  No  Homicidal Thoughts:  No  Memory:  Immediate;   Fair  Judgement:  Fair  Insight:  Fair  Psychomotor Activity:  Increased and Tremor  Concentration:  Concentration: Fair  Recall:  NA  Fund of Knowledge:  Fair  Language:  Fair  Akathisia:  Negative  Handed:  Right  AIMS (if indicated):     Assets:  Communication Skills Desire for Improvement  ADL's:  Intact  Cognition:  WNL  Sleep:      Treatment Plan Summary: 26 years old male with alcohol abuse with intoxication and withdrawal admitted initially at behavioral [place] and then transferred to [facility] for detox treatment. Patient has a mild  symptoms of depression anxiety and hand tremors but no suicidal/homicidal ideation, intention or plans. Patient has no evidence of psychosis.  Alcohol abuse with  intoxication Alcohol induced mood disorder Rule out major depressive disorder and grief  Psychiatric evaluation requested for disposition plans  Patient was discussed about inpatient versus intensive outpatient or outpatient treatment   Change Seroquel 50 mg at bed time due to night time confusion and hallucination and pulling out tubes.  Continue Neurontin 100 mg daily at bedtime As needed for anxiety  Patient stated he cannot afford to lose working days so he prefers to outpatient treatment at behavioral [place]   Case discussed with the unit CSW and patient will be referred to the outpatient psychiatric medication management and also possibly grief counseling  when medically stable.   Disposition: No evidence of imminent risk to self or others at present.   Patient does not meet criteria for psychiatric inpatient admission. Supportive therapy provided about ongoing stressors.  Ambrose Finland, MD 12/24/2016 1:12 PM

## 2016-12-24 NOTE — Progress Notes (Signed)
Pt came out into hallway stating "the girl whose room it is is having to lay on the bench over here and I think it's kinda weird so I want to go to my room" pt brought back into his room, reoriented. Got patient settled into bed with 3 side rails up. Bed alarm has been activated. Will continue to monitor at this time.

## 2016-12-24 NOTE — Progress Notes (Signed)
Pt continues to have periods of confusion tonight. Continues to walk out of his room, attempting to go into other rooms looking for people, seeing people that are not there. Pt is reoriented easily. Will continue to monitor at this time.

## 2016-12-25 LAB — COMPREHENSIVE METABOLIC PANEL
ALK PHOS: 50 U/L (ref 38–126)
ALT: 49 U/L (ref 17–63)
ANION GAP: 14 (ref 5–15)
AST: 43 U/L — ABNORMAL HIGH (ref 15–41)
Albumin: 5.6 g/dL — ABNORMAL HIGH (ref 3.5–5.0)
BILIRUBIN TOTAL: 1.1 mg/dL (ref 0.3–1.2)
BUN: 16 mg/dL (ref 6–20)
CALCIUM: 10.5 mg/dL — AB (ref 8.9–10.3)
CO2: 20 mmol/L — ABNORMAL LOW (ref 22–32)
Chloride: 104 mmol/L (ref 101–111)
Creatinine, Ser: 1.48 mg/dL — ABNORMAL HIGH (ref 0.61–1.24)
Glucose, Bld: 102 mg/dL — ABNORMAL HIGH (ref 65–99)
Potassium: 3.6 mmol/L (ref 3.5–5.1)
SODIUM: 138 mmol/L (ref 135–145)
TOTAL PROTEIN: 9.2 g/dL — AB (ref 6.5–8.1)

## 2016-12-25 LAB — CBC WITH DIFFERENTIAL/PLATELET
Basophils Absolute: 0 10*3/uL (ref 0.0–0.1)
Basophils Relative: 0 %
Eosinophils Absolute: 0 10*3/uL (ref 0.0–0.7)
Eosinophils Relative: 0 %
HEMATOCRIT: 40.9 % (ref 39.0–52.0)
HEMOGLOBIN: 14.1 g/dL (ref 13.0–17.0)
LYMPHS ABS: 1.8 10*3/uL (ref 0.7–4.0)
LYMPHS PCT: 12 %
MCH: 34.2 pg — AB (ref 26.0–34.0)
MCHC: 34.5 g/dL (ref 30.0–36.0)
MCV: 99.3 fL (ref 78.0–100.0)
MONOS PCT: 11 %
Monocytes Absolute: 1.7 10*3/uL — ABNORMAL HIGH (ref 0.1–1.0)
NEUTROS PCT: 77 %
Neutro Abs: 12.2 10*3/uL — ABNORMAL HIGH (ref 1.7–7.7)
Platelets: 518 10*3/uL — ABNORMAL HIGH (ref 150–400)
RBC: 4.12 MIL/uL — ABNORMAL LOW (ref 4.22–5.81)
RDW: 13.2 % (ref 11.5–15.5)
WBC: 15.8 10*3/uL — ABNORMAL HIGH (ref 4.0–10.5)

## 2016-12-25 LAB — MAGNESIUM: MAGNESIUM: 2.2 mg/dL (ref 1.7–2.4)

## 2016-12-25 MED ORDER — QUETIAPINE FUMARATE 25 MG PO TABS
50.0000 mg | ORAL_TABLET | Freq: Once | ORAL | Status: AC
  Administered 2016-12-25: 50 mg via ORAL
  Filled 2016-12-25: qty 2

## 2016-12-25 MED ORDER — HALOPERIDOL 5 MG PO TABS
5.0000 mg | ORAL_TABLET | Freq: Four times a day (QID) | ORAL | Status: DC | PRN
  Administered 2016-12-25 – 2016-12-27 (×5): 5 mg via ORAL
  Filled 2016-12-25 (×5): qty 1

## 2016-12-25 MED ORDER — LORAZEPAM 2 MG/ML IJ SOLN
1.0000 mg | Freq: Four times a day (QID) | INTRAMUSCULAR | Status: DC | PRN
  Administered 2016-12-26 – 2016-12-27 (×2): 1 mg via INTRAMUSCULAR
  Filled 2016-12-25 (×2): qty 1

## 2016-12-25 MED ORDER — QUETIAPINE FUMARATE 25 MG PO TABS
50.0000 mg | ORAL_TABLET | Freq: Three times a day (TID) | ORAL | Status: DC
  Administered 2016-12-25 – 2016-12-29 (×13): 50 mg via ORAL
  Filled 2016-12-25 (×13): qty 2

## 2016-12-25 MED ORDER — HALOPERIDOL LACTATE 5 MG/ML IJ SOLN: 5.0000 mg | Freq: Four times a day (QID) | INTRAMUSCULAR | Status: DC | PRN

## 2016-12-25 NOTE — Progress Notes (Signed)
Patient still having periods of confusion. Coming out of room having hallucinations, stating "a man just came up here and stole yall's computer equipment". Then patient took off down the hall down the stairs to the 4th floor. Patient was brought back to unit by RN and NT and Engineer, materials. Patient is easy to calm and reorient. Will continue to monitor.

## 2016-12-25 NOTE — Progress Notes (Signed)
CSW and psychiatrist met with patient at bedside. Patient still actively hallucinating. CSW will continue to follow and asisit with patient discharge per psychiatrist recommendation.    , LCSWA, MSW Clinical Social Worker 5E and Psychiatric Service Line [phone] 12/25/2016  1:50 PM 

## 2016-12-25 NOTE — Progress Notes (Signed)
Patient left unit again, had to be escorted back to unit by 3 security officers. Patient's agitation in now increasing and he is becoming a threat to staff. He tried to slam the door but was stopped by security patient gave staff the middle finger and closed door. Night coverage MD called and Physician instructed RN to wait 15 minutes to call AM coverage. Will notify morning attending and attempt to keep patient and staff safe.

## 2016-12-25 NOTE — Consult Note (Signed)
[facility] Consult   Reason for Consult: Alcohol abuse with withdrawal Referring Physician:  Dr. Starla Link Patient Identification: Brett Molina MRN:  [id number] Principal Diagnosis: Alcoholic intoxication without complication [facility]) Diagnosis:   Patient Active Problem List   Diagnosis Date Noted  . Alcoholic intoxication without complication ([place]) [E36.629] 12/12/2016  . Hypokalemia [E87.6] 12/12/2016  . Alcoholic hepatitis without ascites [K70.10] 12/12/2016  . MDD (major depressive disorder) [F32.9] 12/12/2016    Total Time spent with patient: 45 minutes  Subjective:   Brett Molina is a 26 y.o. male patient admitted with acute alcohol withdrawal.    HPI: Brett Molina is a 26 years old single male initially admitted to behavioral health [facility] for alcohol detox treatment and later he was transferred to the [facility] for intensive care unit placement and also required ongoing treatment at this time. Patient was initially received Precedex treatment at the ICU and later required nitroglycerin drip for hypertensive urgency. Patient also has acute encephalopathy and delirium on June 17. Patient has been drinking alcohol since he was 26 years old and has been drinking heavily for the last 4 years and has one DUI about 3 and years ago and is driver's license was revoked and hoping to get it back in 2 months. Patient is also reportedly working for a [employer] company and staying with his cousin and brother. Patient reported his family drinks alcohol but mostly a socially. Patient reported his mother passed away in 07-14-16 secondary to complications of the flu. Patient reportedly increased drinking and also not having any mobility secondary to normal license. Patient is able to cooperate with the inpatient treatment program and received alcohol detox treatment since admitted  on 12/11/2016. Patient stated that he likes to have a follow-up treatments with  the behavioral health Hospital but does not want to be inpatient treatment because he has been missing his work since admitted to the hospital cannot afford to lose working days any longer. Patient does not have a transportation middle of the day before participating in intensive outpatient program and is willing to participate in outpatient psychiatric treatment program at behavioral [place].  Patient denies active symptoms of depression, anxiety, suicidal/homicidal ideation, intention or plans he has no evidence of psychosis.   Past Psychiatric History: Patient has no history of alcohol detox [facility] of psychiatric services in the past.   12/25/2016: Interval history: Patient has been confused, delusional, hallucinating, restless and unable to function for the last 48 hours and reportedly required as needed medication to control his agitation and restlessness. Patient reportedly unable to relax or sleep. Patient was given additional dose of Seroquel and Haldol by hospitalist with limited response. Patient recently received alcohol detox treatment and continued to have symptoms of encephalopathy.   Risk to Self: Is patient at risk for suicide?: No Risk to Others:   Prior Inpatient Therapy:   Prior Outpatient Therapy:    Past Medical History: History reviewed. No pertinent past medical history. History reviewed. No pertinent surgical history. Family History: No family history on file. Family Psychiatric  History: Patient reported he has a family members who are social drinkers but nobody is abusing or dependent besides. Patient denied family history of mental illness. Patient mother deceased in December 201 secondary to complication of flu. Patient father lives in [place], [place].  Social History:  History  Alcohol Use  . 8.4 oz/week  . 14 Standard drinks or equivalent per week     History  Drug Use No    Social History   Social History  . Marital status:  Single    Spouse name: N/A  . Number of children: N/A  . Years of education: N/A   Social History Main Topics  . Smoking status: Never Smoker  . Smokeless tobacco: Never Used  . Alcohol use 8.4 oz/week    14 Standard drinks or equivalent per week  . Drug use: No  . Sexual activity: Not Asked   Other Topics Concern  . None   Social History Narrative  . None   Additional Social History:    Allergies:  No Known Allergies  Labs:  Results for orders placed or performed during the hospital encounter of 12/11/16 (from the past 48 hour(s))  CBC with Differential/Platelet     Status: Abnormal   Collection Time: 12/24/16  7:58 AM  Result Value Ref Range   WBC 10.4 4.0 - 10.5 K/uL   RBC 4.28 4.22 - 5.81 MIL/uL   Hemoglobin 14.8 13.0 - 17.0 g/dL   HCT 43.1 39.0 - 52.0 %   MCV 100.7 (H) 78.0 - 100.0 fL   MCH 34.6 (H) 26.0 - 34.0 pg   MCHC 34.3 30.0 - 36.0 g/dL   RDW 13.5 11.5 - 15.5 %   Platelets 490 (H) 150 - 400 K/uL   Neutrophils Relative % 73 %   Neutro Abs 7.6 1.7 - 7.7 K/uL   Lymphocytes Relative 14 %   Lymphs Abs 1.5 0.7 - 4.0 K/uL   Monocytes Relative 13 %   Monocytes Absolute 1.3 (H) 0.1 - 1.0 K/uL   Eosinophils Relative 0 %   Eosinophils Absolute 0.0 0.0 - 0.7 K/uL   Basophils Relative 0 %   Basophils Absolute 0.0 0.0 - 0.1 K/uL  Basic metabolic panel     Status: Abnormal   Collection Time: 12/24/16  7:58 AM  Result Value Ref Range   Sodium 141 135 - 145 mmol/L   Potassium 3.7 3.5 - 5.1 mmol/L   Chloride 105 101 - 111 mmol/L   CO2 21 (L) 22 - 32 mmol/L   Glucose, Bld 99 65 - 99 mg/dL   BUN 11 6 - 20 mg/dL   Creatinine, Ser 1.70 (H) 0.61 - 1.24 mg/dL   Calcium 10.4 (H) 8.9 - 10.3 mg/dL   GFR calc non Af Amer 53 (L) >60 mL/min   GFR calc Af Amer >60 >60 mL/min    Comment: (NOTE) The eGFR has been calculated using the CKD EPI equation. This calculation has not been validated in all clinical situations. eGFR's persistently <60 mL/min signify possible Chronic  Kidney Disease.    Anion gap 15 5 - 15  Magnesium     Status: None   Collection Time: 12/24/16  7:58 AM  Result Value Ref Range   Magnesium 2.1 1.7 - 2.4 mg/dL  CBC with Differential/Platelet     Status: Abnormal   Collection Time: 12/25/16  6:15 AM  Result Value Ref Range   WBC 15.8 (H) 4.0 - 10.5 K/uL   RBC 4.12 (L) 4.22 - 5.81 MIL/uL   Hemoglobin 14.1 13.0 - 17.0 g/dL   HCT 40.9 39.0 - 52.0 %   MCV 99.3 78.0 - 100.0 fL   MCH 34.2 (H) 26.0 - 34.0 pg   MCHC 34.5 30.0 - 36.0 g/dL   RDW 13.2 11.5 - 15.5 %   Platelets 518 (H) 150 - 400 K/uL   Neutrophils Relative % 77 %   Neutro Abs  12.2 (H) 1.7 - 7.7 K/uL   Lymphocytes Relative 12 %   Lymphs Abs 1.8 0.7 - 4.0 K/uL   Monocytes Relative 11 %   Monocytes Absolute 1.7 (H) 0.1 - 1.0 K/uL   Eosinophils Relative 0 %   Eosinophils Absolute 0.0 0.0 - 0.7 K/uL   Basophils Relative 0 %   Basophils Absolute 0.0 0.0 - 0.1 K/uL  Comprehensive metabolic panel     Status: Abnormal   Collection Time: 12/25/16  6:15 AM  Result Value Ref Range   Sodium 138 135 - 145 mmol/L   Potassium 3.6 3.5 - 5.1 mmol/L   Chloride 104 101 - 111 mmol/L   CO2 20 (L) 22 - 32 mmol/L   Glucose, Bld 102 (H) 65 - 99 mg/dL   BUN 16 6 - 20 mg/dL   Creatinine, Ser 1.48 (H) 0.61 - 1.24 mg/dL   Calcium 10.5 (H) 8.9 - 10.3 mg/dL   Total Protein 9.2 (H) 6.5 - 8.1 g/dL   Albumin 5.6 (H) 3.5 - 5.0 g/dL   AST 43 (H) 15 - 41 U/L   ALT 49 17 - 63 U/L   Alkaline Phosphatase 50 38 - 126 U/L   Total Bilirubin 1.1 0.3 - 1.2 mg/dL   GFR calc non Af Amer >60 >60 mL/min   GFR calc Af Amer >60 >60 mL/min    Comment: (NOTE) The eGFR has been calculated using the CKD EPI equation. This calculation has not been validated in all clinical situations. eGFR's persistently <60 mL/min signify possible Chronic Kidney Disease.    Anion gap 14 5 - 15  Magnesium     Status: None   Collection Time: 12/25/16  6:15 AM  Result Value Ref Range   Magnesium 2.2 1.7 - 2.4 mg/dL     Current Facility-Administered Medications  Medication Dose Route Frequency Provider Last Rate Last Dose  . acetaminophen (TYLENOL) tablet 650 mg  650 mg Oral Q6H PRN Derrill Center, NP   650 mg at 12/12/16 1800  . alum & mag hydroxide-simeth (MAALOX/MYLANTA) 200-200-20 MG/5ML suspension 30 mL  30 mL Oral Q4H PRN Derrill Center, NP      . amLODipine (NORVASC) tablet 10 mg  10 mg Oral Daily Erick Colace, NP   10 mg at 12/25/16 1021  . enoxaparin (LOVENOX) injection 40 mg  40 mg Subcutaneous Q24H Florencia Reasons, MD   40 mg at 12/24/16 1355  . folic acid (FOLVITE) tablet 1 mg  1 mg Oral Daily Robbie Lis, MD   1 mg at 12/25/16 1021  . gabapentin (NEURONTIN) capsule 100 mg  100 mg Oral QHS Starla Link, Kshitiz, MD   100 mg at 12/24/16 2120  . haloperidol (HALDOL) tablet 5 mg  5 mg Oral Q6H PRN Aline August, MD       Or  . haloperidol lactate (HALDOL) injection 5 mg  5 mg Intravenous Q6H PRN Alekh, Kshitiz, MD      . hydrALAZINE (APRESOLINE) injection 10 mg  10 mg Intravenous Q6H PRN Alekh, Kshitiz, MD      . hydrALAZINE (APRESOLINE) tablet 50 mg  50 mg Oral Q8H Alekh, Kshitiz, MD   50 mg at 12/25/16 0518  . labetalol (NORMODYNE) tablet 200 mg  200 mg Oral BID Aline August, MD   200 mg at 12/25/16 1021  . LORazepam (ATIVAN) injection 1 mg  1 mg Intramuscular Q6H PRN Ambrose Finland, MD      . magnesium hydroxide (MILK OF MAGNESIA) suspension 30  mL  30 mL Oral Daily PRN Derrill Center, NP      . multivitamin with minerals tablet 1 tablet  1 tablet Oral Daily Robbie Lis, MD   1 tablet at 12/25/16 1021  . ondansetron (ZOFRAN) tablet 4 mg  4 mg Oral Q6H PRN Robbie Lis, MD       Or  . ondansetron [facility]) injection 4 mg  4 mg Intravenous Q6H PRN Robbie Lis, MD      . QUEtiapine (SEROQUEL) tablet 50 mg  50 mg Oral TID Ambrose Finland, MD      . sodium chloride flush (NS) 0.9 % injection 3 mL  3 mL Intravenous Q12H Robbie Lis, MD   3 mL at 12/25/16 1000  . thiamine  (VITAMIN B-1) tablet 100 mg  100 mg Oral Daily Robbie Lis, MD   100 mg at 12/25/16 1021    Musculoskeletal: Strength & Muscle Tone: within normal limits Gait & Station: unsteady Patient leans: N/A  Psychiatric Specialty Exam: Physical ExamAs per history and physical   ROS  Blood pressure 135/81, pulse (!) 120, temperature 99.1 F (37.3 C), temperature source Oral, resp. rate 16, height 6' 2"  (1.88 m), weight 97.3 kg (214 lb 8.1 oz), SpO2 100 %.Body mass index is 27.54 kg/m.  General Appearance: Casual and Fairly Groomed  Eye Contact:  Fair  Speech:  Clear and Coherent  Volume:  Normal  Mood:  Anxious and Depressed, restless   Affect:  Congruent and Depressed  Thought Process:  Disorganized and Irrelevant and confused   Orientation:  Full (Time, Place, and Person)  Thought Content:  Logical  Suicidal Thoughts:  No  Homicidal Thoughts:  No  Memory:  Immediate;   Fair  Judgement:  Fair  Insight:  Fair  Psychomotor Activity:  Restlessness  Concentration:  Concentration: Fair  Recall:  NA  Fund of Knowledge:  Fair  Language:  Fair  Akathisia:  Negative  Handed:  Right  AIMS (if indicated):     Assets:  Communication Skills Desire for Improvement  ADL's:  Intact  Cognition:  WNL  Sleep:      Treatment Plan Summary: 26 years old male with alcohol abuse with intoxication and withdrawal admitted initially at behavioral [facility] and then transferred to [facility] for detox treatment. Patient has a mild symptoms of depression anxiety and hand tremors but no suicidal/homicidal ideation, intention or plans. Patient has no evidence of psychosis.  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  Continue Neurontin 100 mg daily at bedtime As needed for anxiety  Case discussed with the unit CSW and patient will be referred to the outpatient psychiatric medication management and also possibly grief counseling  when medically stable.   Disposition: Recommend psychiatric Inpatient admission when medically cleared. Supportive therapy provided about ongoing stressors.  Ambrose Finland, MD 12/25/2016 2:11 PM

## 2016-12-25 NOTE — Progress Notes (Signed)
Patient resting in room. Assessment unchanged from previous RN. Will continue to monitor.

## 2016-12-25 NOTE — Progress Notes (Signed)
Patient ID: Brett Molina, male   DOB: Mar 26, 1991, 26 y.o.   MRN: [id number]  PROGRESS NOTE    Brett Molina  WUJ:[id number] DOB: April 17, 1991 DOA: 12/11/2016 PCP: Patient, No Pcp Per   Brief Narrative: 26 y.o.malewith medical history significant for alcohol abuse, depression. Patient presented to ED on 6/8 for evaluation of depression and alcoholism but per psych eval in ED, pt at risk for withdrawals so not stable to come to [facility].   Patient went into full blown withdrawal on 6/9 and was extremely confused and agitated, he wasstarted on precedex drip, nitro drip for elevated BP He wastransferred to icu under critical care service , he received prn ativan, haldol While in the ICU, he wasweaned off precedex drip on 6/15, and transferred back to hospitalist service on 6/16 am.  Patient had severe agitation on 6/16night, hadto be transferredback to iuc and started back on precedex. His condition improved, so was transferred out of ICU on 6/19. Benzos have been discontinued; Seroquel is being tapered. His blood pressure has improved with adjustment of his antihypertensive regimen.   Patient has been intermittently hallucinating which worsened overnight.   Assessment & Plan:   Principal Problem:   Alcoholic intoxication without complication (HCC) Active Problems:   Hypokalemia   Alcoholic hepatitis without ascites   MDD (major depressive disorder)  Hallucinations, auditory and visual both - Patient has been intermittently hallucinating and this worsened last night with patient being found at multiple locations in the hospital and security had to bring the patient back in the room. Give Seroquel 50 mg 1 dose now and use IV Haldol for extreme hallucination. Keep sitter at bedside for patient safety. Patient is not safe to be discharged at this point. I have spoken on phone to Dr. Elsie Saas from psychiatry and requested him to evaluate the patient. Awaiting further recommendations from  psychiatry and need for inpatient psychiatry admission.  Acute encephalopathy with agitated delirium - improving; mental status still  fluctuating. Monitor off Benzos; continue Seroquel  Alcoholic intoxication ,severe alcohol withdrawal required ICU Care from 6/9 to 6/15 -Alcohol level 493 in ED -Patient Brett Molina into full blown withdrawal 6/9 and was extremely confused and agitated, he was started on precedex drip, nitro drip for elevated BP He was transferred to icu -he received prn ativan, haldol While in the ICU, he wasweaned off precedex drip on 6/15, and transferred back to hospitalist service pn 6/16.had severe agitation on 6/16night, had to be transferredback to ICUand started back on precedex - Continue ciwa icu protocol, multivitamin, thiamine and folic acid,  - Transferred out of ICU on 6/19; off Precedex - Alcohol withdrawal probably has resolved so far  Transaminitis - probably from alcohol abuse; improving. Trend LFTs   Fatty liver by [place] -[place] w/out stones.   Hypertensive urgency -Blood pressure improved. Continue labetalol 200 mg by mouth twice a day and hydralazine to 50 mg by mouth 3 times a day. Continue oral amlodipine. Monitor blood pressure.  Macrocytosis- likely related to alcohol use. Trend CBC.   Depression/anxiety -He was brought to the ED by his family due to concerning alcohol use/ depression/anxiety which start to affect his life/work --Currently denies SI/HI - Psych consultation has been appreciated. Follow further recommendations  Overweight:  Body mass index is 27.99 kg/m.   DVT prophylaxis:Lovenox Code Status:full code  Family Communication:none present at bedside  Disposition Plan:Unclear at this time  Consultants:Psychiatry; critical care   Procedures:None Antimicrobials:None  Subjective: Patient seen and examined at bedside. He is awake  but is currently hallucinating. He states that he is seeing other people  not present in the room and hearing voices.  Objective: Vitals:   12/24/16 1126 12/24/16 1347 12/24/16 2141 12/25/16 0450  BP: 120/67 117/73 (!) 153/84 135/81  Pulse: 97 96 (!) 107 (!) 120  Resp:  18 18 16   Temp:  98.7 F (37.1 C) 99.1 F (37.3 C) 99.1 F (37.3 C)  TempSrc:  Oral Oral Oral  SpO2:  96% 99% 100%  Weight:      Height:       No intake or output data in the 24 hours ending 12/25/16 1018 Filed Weights   12/21/16 0455 12/22/16 0333 12/23/16 0749  Weight: 99.1 kg (218 lb 7.6 oz) 99.2 kg (218 lb 11.1 oz) 97.3 kg (214 lb 8.1 oz)    Examination:  General exam: Appears To be hallucinating  Respiratory system: Bilateral decreased breath sound at bases Cardiovascular system: S1 & S2 heard, tachycardic. Gastrointestinal system: Abdomen is nondistended, soft and nontender. Normal bowel sounds heard. Extremities: No cyanosis, clubbing, edema     Data Reviewed: I have personally reviewed following labs and imaging studies  CBC:  Recent Labs Lab 12/19/16 0639 12/20/16 1220 12/24/16 0758 12/25/16 0615  WBC 6.7  --  10.4 15.8*  NEUTROABS  --   --  7.6 12.2*  HGB 13.1  --  14.8 14.1  HCT 38.3* 39.9 43.1 40.9  MCV 100.0  --  100.7* 99.3  PLT 295  --  490* 518*   Basic Metabolic Panel:  Recent Labs Lab 12/19/16 0639 12/20/16 0327 12/21/16 0255 12/22/16 0315 12/24/16 0758 12/25/16 0615  NA 139 141 141 141 141 138  K 3.7 3.3* 3.9 3.5 3.7 3.6  CL 105 108 111 110 105 104  CO2 22 23 21* 24 21* 20*  GLUCOSE 104* 99 104* 96 99 102*  BUN 8 7 6 9 11 16   CREATININE 0.83 0.74 0.61 0.85 1.70* 1.48*  CALCIUM 9.7 9.5 9.7 9.2 10.4* 10.5*  MG 2.1 1.7 2.1  --  2.1 2.2   GFR: Estimated Creatinine Clearance: 86.4 mL/min (A) (by C-G formula based on SCr of 1.48 mg/dL (H)). Liver Function Tests:  Recent Labs Lab 12/20/16 0327 12/21/16 0255 12/25/16 0615  AST 60* 45* 43*  ALT 72* 63 49  ALKPHOS 46 48 50  BILITOT 1.1 0.5 1.1  PROT 7.7 7.8 9.2*  ALBUMIN 4.4 4.3  5.6*    Recent Labs Lab 12/21/16 0255  LIPASE 49    Recent Labs Lab 12/21/16 0255  AMMONIA 23   Coagulation Profile: No results for input(s): INR, PROTIME in the last 168 hours. Cardiac Enzymes: No results for input(s): CKTOTAL, CKMB, CKMBINDEX, TROPONINI in the last 168 hours. BNP (last 3 results) No results for input(s): PROBNP in the last 8760 hours. HbA1C: No results for input(s): HGBA1C in the last 72 hours. CBG:  Recent Labs Lab 12/19/16 0753 12/20/16 0823 12/20/16 2317 12/21/16 0737 12/22/16 0748  GLUCAP 93 105* 110* 98 83   Lipid Profile: No results for input(s): CHOL, HDL, LDLCALC, TRIG, CHOLHDL, LDLDIRECT in the last 72 hours. Thyroid Function Tests: No results for input(s): TSH, T4TOTAL, FREET4, T3FREE, THYROIDAB in the last 72 hours. Anemia Panel: No results for input(s): VITAMINB12, FOLATE, FERRITIN, TIBC, IRON, RETICCTPCT in the last 72 hours. Sepsis Labs: No results for input(s): PROCALCITON, LATICACIDVEN in the last 168 hours.  No results found for this or any previous visit (from the past 240 hour(s)).  Radiology Studies: No results found.      Scheduled Meds: . amLODipine  10 mg Oral Daily  . enoxaparin (LOVENOX) injection  40 mg Subcutaneous Q24H  . folic acid  1 mg Oral Daily  . gabapentin  100 mg Oral QHS  . hydrALAZINE  50 mg Oral Q8H  . labetalol  200 mg Oral BID  . multivitamin with minerals  1 tablet Oral Daily  . QUEtiapine  50 mg Oral QHS  . sodium chloride flush  3 mL Intravenous Q12H  . thiamine  100 mg Oral Daily   Continuous Infusions:   LOS: 13 days        Glade Lloyd, MD [facility] Pager [phone]  If 7PM-7AM, please contact night-coverage [email or web address] Password [facility] 12/25/2016, 10:18 AM

## 2016-12-25 NOTE — Progress Notes (Signed)
Patient hallucinating, attempting to leave unit.   MD paged.

## 2016-12-25 NOTE — Progress Notes (Signed)
NT went into patient's room to check morning vitals. Patient found on the floor beside bed asleep. RN called into room. Patient states that he wanted to be on the floor to sleep. Patient instructed to get up and get back in bed. Patient's shoes were found hanging outside of window. Patient asked how his shoes got there, he states "I don't know". Patient able to calm down and get his vitals taken. Patient now resting in bed.

## 2016-12-26 LAB — CBC WITH DIFFERENTIAL/PLATELET
BASOS ABS: 0 10*3/uL (ref 0.0–0.1)
BASOS PCT: 0 %
EOS ABS: 0 10*3/uL (ref 0.0–0.7)
Eosinophils Relative: 0 %
HEMATOCRIT: 40.3 % (ref 39.0–52.0)
HEMOGLOBIN: 14.1 g/dL (ref 13.0–17.0)
Lymphocytes Relative: 16 %
Lymphs Abs: 1.5 10*3/uL (ref 0.7–4.0)
MCH: 34.6 pg — ABNORMAL HIGH (ref 26.0–34.0)
MCHC: 35 g/dL (ref 30.0–36.0)
MCV: 98.8 fL (ref 78.0–100.0)
Monocytes Absolute: 1.3 10*3/uL — ABNORMAL HIGH (ref 0.1–1.0)
Monocytes Relative: 14 %
NEUTROS ABS: 6.4 10*3/uL (ref 1.7–7.7)
NEUTROS PCT: 70 %
Platelets: 454 10*3/uL — ABNORMAL HIGH (ref 150–400)
RBC: 4.08 MIL/uL — ABNORMAL LOW (ref 4.22–5.81)
RDW: 13 % (ref 11.5–15.5)
WBC: 9.3 10*3/uL (ref 4.0–10.5)

## 2016-12-26 LAB — COMPREHENSIVE METABOLIC PANEL
ALBUMIN: 4.9 g/dL (ref 3.5–5.0)
ALK PHOS: 47 U/L (ref 38–126)
ALT: 42 U/L (ref 17–63)
ANION GAP: 10 (ref 5–15)
AST: 41 U/L (ref 15–41)
BILIRUBIN TOTAL: 0.9 mg/dL (ref 0.3–1.2)
BUN: 7 mg/dL (ref 6–20)
CALCIUM: 9.9 mg/dL (ref 8.9–10.3)
CO2: 21 mmol/L — AB (ref 22–32)
Chloride: 105 mmol/L (ref 101–111)
Creatinine, Ser: 0.93 mg/dL (ref 0.61–1.24)
GFR calc Af Amer: >60 mL/min (ref >60)
GFR calc non Af Amer: >60 mL/min (ref >60)
GLUCOSE: 105 mg/dL — AB (ref 65–99)
POTASSIUM: 3.6 mmol/L (ref 3.5–5.1)
SODIUM: 136 mmol/L (ref 135–145)
TOTAL PROTEIN: 8.3 g/dL — AB (ref 6.5–8.1)

## 2016-12-26 LAB — MAGNESIUM: Magnesium: 2.1 mg/dL (ref 1.7–2.4)

## 2016-12-26 MED ORDER — LABETALOL HCL 300 MG PO TABS
300.0000 mg | ORAL_TABLET | Freq: Two times a day (BID) | ORAL | Status: DC
  Administered 2016-12-26 – 2016-12-29 (×6): 300 mg via ORAL
  Filled 2016-12-26 (×6): qty 1

## 2016-12-26 NOTE — Progress Notes (Signed)
Patient ID: Brett Molina, male   DOB: 1991-01-07, 26 y.o.   MRN: [id number]  PROGRESS NOTE    Brett Molina  WUJ:[id number] DOB: May 03, 1991 DOA: 12/11/2016 PCP: Patient, No Pcp Per   Brief Narrative:  26 y.o.malewith medical history significant for alcohol abuse, depression. Patient presented to ED on 6/8 for evaluation of depression and alcoholism but per psych eval in ED, pt at risk for withdrawals so not stable to come to [facility].   Patient went into full blown withdrawal on 6/9 and was extremely confused and agitated, he wasstarted on precedex drip, nitro drip for elevated BP He wastransferred to icu under critical care service , he received prn ativan, haldol While in the ICU, he wasweaned off precedex drip on 6/15, and transferred back to hospitalist service on 6/16 am.  Patient had severe agitation on 6/16night, hadto be transferredback to iuc and started back on precedex. His condition improved, so was transferred out of ICU on 6/19. Benzos have been discontinued; Seroquel is being tapered. His blood pressure has improved with adjustment of his antihypertensive regimen.   Patient has been having worsening intermittent hallucinations.Psychiatry is following the patient  Assessment & Plan:   Principal Problem:   Alcoholic intoxication without complication (HCC) Active Problems:   Hypokalemia   Alcoholic hepatitis without ascites   MDD (major depressive disorder)  Hallucinations, auditory and visual both - Psychiatry is following. Continue meds as per psychiatry recommendations. Continue bedside sitter for safety. Patient will need to be discharged to inpatient behavioral health probably tomorrow if blood pressure is better.  Acute encephalopathy with agitated delirium - improving; mental status still  fluctuating. Monitor off Benzos; continue Seroquel  Alcoholic intoxication ,severe alcohol withdrawal required ICU Care from 6/9 to 6/15 -Alcohol level 493 in ED -Patient  Brett Molina into full blown withdrawal 6/9 and was extremely confused and agitated, he was started on precedex drip, nitro drip for elevated BP He was transferred to icu -he received prn ativan, haldol While in the ICU, he wasweaned off precedex drip on 6/15, and transferred back to hospitalist service pn 6/16.had severe agitation on 6/16night, had to be transferredback to ICUand started back on precedex - Continue ciwa icu protocol, multivitamin, thiamine and folic acid,  - Transferred out of ICU on 6/19; off Precedex - Alcohol withdrawal probably has resolved so far  Transaminitis - probably from alcohol abuse; improved  Fatty liver by [place] -[place] w/out stones.   Hypertensive urgency -Blood pressureimproved but still intermittently high. Increase  labetalol to 300 mg by mouth twice a day continue hydralazine 50 mg by mouth 3 times a day.Continue oral amlodipine. Monitor blood pressure.  Macrocytosis- likely related to alcohol use. Trend CBC.   Depression/anxiety -He was brought to the ED by his family due to concerning alcohol use/ depression/anxiety which start to affect his life/work --Currently denies SI/HI - Psych consultation has been appreciated. Follow further recommendations  Overweight:  Body mass index is 27.99 kg/m.   DVT prophylaxis:Lovenox Code Status:full code  Family Communication:none present at bedside  Disposition Plan:Probable discharge to inpatient psychiatric facility tomorrow if bed available and if blood pressure improves.  Consultants:Psychiatry; critical care   Procedures:None Antimicrobials:None Subjective: Patient seen and examined at bedside. He is more calm this morning and answers some questions. No overnight fever, nausea, vomiting  Objective: Vitals:   12/25/16 0450 12/25/16 1420 12/25/16 2109 12/26/16 0513  BP: 135/81 (!) 147/84 (!) 169/105 (!) 177/97  Pulse: (!) 120 98 (!) 105 (!) 106  Resp: 16  16 17 17   Temp: 99.1  F (37.3 C) 99 F (37.2 C) 100 F (37.8 C) 99.9 F (37.7 C)  TempSrc: Oral Oral Oral Axillary  SpO2: 100% 100% 100% 100%  Weight:    95 kg (209 lb 7 oz)  Height:        Intake/Output Summary (Last 24 hours) at 12/26/16 1304 Last data filed at 12/26/16 0828  Gross per 24 hour  Intake              720 ml  Output                0 ml  Net              720 ml   Filed Weights   12/22/16 0333 12/23/16 0749 12/26/16 0513  Weight: 99.2 kg (218 lb 11.1 oz) 97.3 kg (214 lb 8.1 oz) 95 kg (209 lb 7 oz)    Examination:  General exam: Appears calm and comfortable  Respiratory system: Bilateral decreased breath sound at bases Cardiovascular system: S1 & S2 heard,Slightly tachycardic Gastrointestinal system: Abdomen is nondistended, soft and nontender. Normal bowel sounds heard. Extremities: No cyanosis, clubbing, edema       Data Reviewed: I have personally reviewed following labs and imaging studies  CBC:  Recent Labs Lab 12/20/16 1220 12/24/16 0758 12/25/16 0615 12/26/16 0749  WBC  --  10.4 15.8* 9.3  NEUTROABS  --  7.6 12.2* 6.4  HGB  --  14.8 14.1 14.1  HCT 39.9 43.1 40.9 40.3  MCV  --  100.7* 99.3 98.8  PLT  --  490* 518* 454*   Basic Metabolic Panel:  Recent Labs Lab 12/20/16 0327 12/21/16 0255 12/22/16 0315 12/24/16 0758 12/25/16 0615 12/26/16 0749  NA 141 141 141 141 138 136  K 3.3* 3.9 3.5 3.7 3.6 3.6  CL 108 111 110 105 104 105  CO2 23 21* 24 21* 20* 21*  GLUCOSE 99 104* 96 99 102* 105*  BUN 7 6 9 11 16 7   CREATININE 0.74 0.61 0.85 1.70* 1.48* 0.93  CALCIUM 9.5 9.7 9.2 10.4* 10.5* 9.9  MG 1.7 2.1  --  2.1 2.2 2.1   GFR: Estimated Creatinine Clearance: 137.5 mL/min (by C-G formula based on SCr of 0.93 mg/dL). Liver Function Tests:  Recent Labs Lab 12/20/16 0327 12/21/16 0255 12/25/16 0615 12/26/16 0749  AST 60* 45* 43* 41  ALT 72* 63 49 42  ALKPHOS 46 48 50 47  BILITOT 1.1 0.5 1.1 0.9  PROT 7.7 7.8 9.2* 8.3*  ALBUMIN 4.4 4.3 5.6* 4.9     Recent Labs Lab 12/21/16 0255  LIPASE 49    Recent Labs Lab 12/21/16 0255  AMMONIA 23   Coagulation Profile: No results for input(s): INR, PROTIME in the last 168 hours. Cardiac Enzymes: No results for input(s): CKTOTAL, CKMB, CKMBINDEX, TROPONINI in the last 168 hours. BNP (last 3 results) No results for input(s): PROBNP in the last 8760 hours. HbA1C: No results for input(s): HGBA1C in the last 72 hours. CBG:  Recent Labs Lab 12/20/16 0823 12/20/16 2317 12/21/16 0737 12/22/16 0748  GLUCAP 105* 110* 98 83   Lipid Profile: No results for input(s): CHOL, HDL, LDLCALC, TRIG, CHOLHDL, LDLDIRECT in the last 72 hours. Thyroid Function Tests: No results for input(s): TSH, T4TOTAL, FREET4, T3FREE, THYROIDAB in the last 72 hours. Anemia Panel: No results for input(s): VITAMINB12, FOLATE, FERRITIN, TIBC, IRON, RETICCTPCT in the last 72 hours. Sepsis Labs: No results for input(s): PROCALCITON, LATICACIDVEN  in the last 168 hours.  No results found for this or any previous visit (from the past 240 hour(s)).       Radiology Studies: No results found.      Scheduled Meds: . amLODipine  10 mg Oral Daily  . enoxaparin (LOVENOX) injection  40 mg Subcutaneous Q24H  . folic acid  1 mg Oral Daily  . gabapentin  100 mg Oral QHS  . hydrALAZINE  50 mg Oral Q8H  . labetalol  300 mg Oral BID  . multivitamin with minerals  1 tablet Oral Daily  . QUEtiapine  50 mg Oral TID  . sodium chloride flush  3 mL Intravenous Q12H  . thiamine  100 mg Oral Daily   Continuous Infusions:   LOS: 14 days        Glade Lloyd, MD [facility] Pager [phone]  If 7PM-7AM, please contact night-coverage [email or web address] Password [email or web address] 12/26/2016, 1:04 PM

## 2016-12-27 LAB — BASIC METABOLIC PANEL
Anion gap: 11 (ref 5–15)
BUN: 10 mg/dL (ref 6–20)
CO2: 25 mmol/L (ref 22–32)
Calcium: 10.3 mg/dL (ref 8.9–10.3)
Chloride: 102 mmol/L (ref 101–111)
Creatinine, Ser: 1.16 mg/dL (ref 0.61–1.24)
GFR calc Af Amer: >60 mL/min (ref >60)
GFR calc non Af Amer: >60 mL/min (ref >60)
Glucose, Bld: 116 mg/dL — ABNORMAL HIGH (ref 65–99)
Potassium: 4 mmol/L (ref 3.5–5.1)
Sodium: 138 mmol/L (ref 135–145)

## 2016-12-27 LAB — CBC WITH DIFFERENTIAL/PLATELET
Basophils Absolute: 0.1 10*3/uL (ref 0.0–0.1)
Basophils Relative: 1 %
Eosinophils Absolute: 0.1 10*3/uL (ref 0.0–0.7)
Eosinophils Relative: 1 %
HCT: 42.4 % (ref 39.0–52.0)
Hemoglobin: 14.5 g/dL (ref 13.0–17.0)
Lymphocytes Relative: 15 %
Lymphs Abs: 1.5 10*3/uL (ref 0.7–4.0)
MCH: 34.1 pg — ABNORMAL HIGH (ref 26.0–34.0)
MCHC: 34.2 g/dL (ref 30.0–36.0)
MCV: 99.8 fL (ref 78.0–100.0)
Monocytes Absolute: 1.2 10*3/uL — ABNORMAL HIGH (ref 0.1–1.0)
Monocytes Relative: 12 %
Neutro Abs: 7.1 10*3/uL (ref 1.7–7.7)
Neutrophils Relative %: 71 %
Platelets: 456 10*3/uL — ABNORMAL HIGH (ref 150–400)
RBC: 4.25 MIL/uL (ref 4.22–5.81)
RDW: 13 % (ref 11.5–15.5)
WBC: 9.9 10*3/uL (ref 4.0–10.5)

## 2016-12-27 LAB — MAGNESIUM: Magnesium: 2.1 mg/dL (ref 1.7–2.4)

## 2016-12-27 MED ORDER — LABETALOL HCL 300 MG PO TABS: 300.0000 mg | ORAL_TABLET | Freq: Two times a day (BID) | ORAL | 0 refills | Status: DC

## 2016-12-27 MED ORDER — GABAPENTIN 100 MG PO CAPS: 100.0000 mg | ORAL_CAPSULE | Freq: Every day | ORAL | 0 refills | Status: DC

## 2016-12-27 MED ORDER — FOLIC ACID 1 MG PO TABS: 1.0000 mg | ORAL_TABLET | Freq: Every day | ORAL | 0 refills | Status: DC

## 2016-12-27 MED ORDER — THIAMINE HCL 100 MG PO TABS: 100.0000 mg | ORAL_TABLET | Freq: Every day | ORAL | 0 refills | Status: DC

## 2016-12-27 MED ORDER — AMLODIPINE BESYLATE 10 MG PO TABS: 10.0000 mg | ORAL_TABLET | Freq: Every day | ORAL | 0 refills | Status: DC

## 2016-12-27 MED ORDER — QUETIAPINE FUMARATE 50 MG PO TABS: 50.0000 mg | ORAL_TABLET | Freq: Three times a day (TID) | ORAL | 0 refills | Status: DC

## 2016-12-27 MED ORDER — HYDRALAZINE HCL 50 MG PO TABS: 50.0000 mg | ORAL_TABLET | Freq: Three times a day (TID) | ORAL | 0 refills | Status: DC

## 2016-12-27 MED ORDER — ADULT MULTIVITAMIN W/MINERALS CH: 1.0000 | ORAL_TABLET | Freq: Every day | ORAL | 0 refills | Status: DC

## 2016-12-27 NOTE — Discharge Summary (Addendum)
Physician Discharge Summary  Brett Molina ZOX:[id number] DOB: 1991-05-01 DOA: 12/11/2016  PCP: Patient, No Pcp Per  Admit date: 12/11/2016 Discharge date: 12/27/2016  Admitted From: Home Disposition:  Inpatient psychiatric facility  Recommendations for Outpatient Follow-up:  Follow up with physician at inpatient psychiatric facility at earliest convenience  Home Health: No  Equipment/Devices: None   Discharge Condition: Stable  CODE STATUS: Full  Diet recommendation: Heart Healthy   Brief/Interim Summary: 26 y.o.malewith medical history significant for alcohol abuse, depression. Patient presented to ED on 6/8 for evaluation of depression and alcoholism but per psych eval in ED, pt at risk for withdrawals so not stable to come to [facility].   Patient went into full blown withdrawal on 6/9 and was extremely confused and agitated, he wasstarted on precedex drip, nitro drip for elevated BP He wastransferred to icu under critical care service , he received prn ativan, haldol While in the ICU, he wasweaned off precedex drip on 6/15, and transferred back to hospitalist service on 6/16 am.  Patient had severe agitation on 6/16night, hadto be transferredback to iuc and started back on precedex. His condition improved, so was transferred out of ICU on 6/19. Benzos have been discontinued; Seroquel is being tapered. His blood pressure has improved with adjustment of his antihypertensive regimen.   Patient has been having worsening intermittent hallucinations.Psychiatry is following the patient. Psychiatry recommended inpatient psychiatric hospitalization once medically stable. His blood pressure is improved.  Discharge Diagnoses:  Principal Problem:   Alcoholic intoxication without complication (HCC) Active Problems:   Hypokalemia   Alcoholic hepatitis without ascites   MDD (major depressive disorder)  Hallucinations, auditory and visual both - Psychiatry is following. Continue meds as per  psychiatry recommendations. Continue bedside sitter for safety.  - Discharge to inpatient psychiatry facility once bed is available as per psychiatry recommendations. Patient is medically stable for discharge.  Acute encephalopathy with agitated delirium - improving; mental status still fluctuating. Monitor off Benzos; continue Seroquel  Alcoholic intoxication ,severe alcohol withdrawal required ICU Care from 6/9 to 6/15 -Alcohol level 493 in ED -Patient Brett Molina into full blown withdrawal 6/9 and was extremely confused and agitated, he was started on precedex drip, nitro drip for elevated BP He was transferred to icu -he received prn ativan, haldol While in the ICU, he wasweaned off precedex drip on 6/15, and transferred back to hospitalist service pn 6/16.had severe agitation on 6/16night, had to be transferredback to ICUand started back on precedex - Continue ciwa icu protocol, multivitamin, thiamine and folic acid,  - Transferred out of ICU on 6/19; off Precedex - Alcohol withdrawal probably has resolved so far  Transaminitis - probably from alcohol abuse; improved  Fatty liver by [place] -[place] w/out stones.   Hypertensive urgency -Blood pressureimproved. Continue labetalol  300 mgby mouth twice a day; continue hydralazine 50 mg by mouth 3 times a day.Continue oral amlodipine. Monitor blood pressure.  Macrocytosis- likely related to alcohol use. Trend CBC.   Depression/anxiety -He was brought to the ED by his family due to concerning alcohol use/ depression/anxiety which start to affect his life/work --Currently denies SI/HI - Psych consultation has been appreciated. Follow further recommendations  Overweight:  Body mass index is 27.99 kg/m.  Addendum Patient remained in the hospital to 12/29/2016 waiting for a bed in the inpatient psychiatry hospital. He is being discharged today on 12/29/2016 as there is a bed available.  Discharge Instructions  Discharge  Instructions    Call MD for:  difficulty breathing, headache or visual  disturbances    Complete by:  As directed    Call MD for:  hives    Complete by:  As directed    Call MD for:  persistant dizziness or light-headedness    Complete by:  As directed    Call MD for:  persistant nausea and vomiting    Complete by:  As directed    Call MD for:  severe uncontrolled pain    Complete by:  As directed    Call MD for:  temperature >100.4    Complete by:  As directed    Diet - low sodium heart healthy    Complete by:  As directed    Increase activity slowly    Complete by:  As directed      Allergies as of 12/27/2016   No Known Allergies     Medication List    TAKE these medications   amLODipine 10 MG tablet Commonly known as:  NORVASC Take 1 tablet (10 mg total) by mouth daily.   folic acid 1 MG tablet Commonly known as:  FOLVITE Take 1 tablet (1 mg total) by mouth daily.   gabapentin 100 MG capsule Commonly known as:  NEURONTIN Take 1 capsule (100 mg total) by mouth at bedtime.   hydrALAZINE 50 MG tablet Commonly known as:  APRESOLINE Take 1 tablet (50 mg total) by mouth every 8 (eight) hours.   labetalol 300 MG tablet Commonly known as:  NORMODYNE Take 1 tablet (300 mg total) by mouth 2 (two) times daily.   multivitamin with minerals Tabs tablet Take 1 tablet by mouth daily.   QUEtiapine 50 MG tablet Commonly known as:  SEROQUEL Take 1 tablet (50 mg total) by mouth 3 (three) times daily.   thiamine 100 MG tablet Take 1 tablet (100 mg total) by mouth daily.      Follow-up Information    [facility]-EMERGENCY DEPT Follow up.   Specialty:  Emergency Medicine Why:  If symptoms worsen Contact information: [address] [id number] mc [place] [address] [phone]       Leata Mouse, MD Follow up in 1 week(s).   Specialty:  Psychiatry Contact information: [address] [place]  [address] [phone]          No Known Allergies  Consultations:  Psychiatry and critical care   Procedures/Studies: Ct Head Wo Contrast  Result Date: 12/15/2016 CLINICAL DATA:  Alcohol withdrawal, agitation, confusion. EXAM: CT HEAD WITHOUT CONTRAST TECHNIQUE: Contiguous axial images were obtained from the base of the skull through the vertex without intravenous contrast. COMPARISON:  None. FINDINGS: Brain: No evidence of acute infarction, hemorrhage, hydrocephalus, extra-axial collection or mass lesion/mass effect. Vascular: No hyperdense vessel or unexpected calcification. Skull: Normal. Negative for fracture or focal lesion. Sinuses/Orbits: Visualized globes and orbits are unremarkable. Visualized sinuses and mastoid air cells are clear. Other: None. IMPRESSION: Normal unenhanced CT scan of the brain. Electronically Signed   By: Amie Portland M.D.   On: 12/15/2016 10:52   Dg Chest Port 1 View  Result Date: 12/14/2016 CLINICAL DATA:  Acute onset of respiratory failure. Initial encounter. EXAM: PORTABLE CHEST 1 VIEW COMPARISON:  None. FINDINGS: The lungs are well-aerated and clear. There is no evidence of focal opacification, pleural effusion or pneumothorax. The left costophrenic angle is incompletely imaged. The cardiomediastinal silhouette is borderline normal in size. No acute osseous abnormalities are seen. IMPRESSION: No acute cardiopulmonary process seen. Electronically Signed   By: Beryle Beams.D.  On: 12/14/2016 05:43   US Abdomen Limited Ruq  Result Date: 12/20/2016 CLINICAL DATA:  Elevated LFTs. Alcohol abuse. Question of cirrhosis. EXAM: ULTRASOUND ABDOMEN LIMITED RIGHT UPPER QUADRANT COMPARISON:  None. FINDINGS: Gallbladder: Gallbladder has a normal appearance. Gallbladder wall is 2.2 mm, within normal limits. No stones or pericholecystic fluid. No sonographic Murphy's sign. Common bile duct: Diameter: 3.7 mm Liver: The liver is echogenic. There is attenuation of the  ultrasound wave, poor visualization of the internal hepatic architecture, and loss of definition of the diaphragm. No focal liver lesions are identified. IMPRESSION: Hepatic steatosis.  No evidence for acute cholecystitis. Electronically Signed   By: Norva Pavlov M.D.   On: 12/20/2016 15:16       Subjective: Patient seen and examined at bedside. He is more calm this morning but he thinks is related to leave the hospital. No overnight fever, nausea, vomiting.  Discharge Exam: Vitals:   12/26/16 2333 12/27/16 0625  BP: 120/68 127/84  Pulse: (!) 119 (!) 108  Resp: 17 (!) 22  Temp: 99 F (37.2 C) 98.4 F (36.9 C)   Vitals:   12/26/16 0513 12/26/16 1414 12/26/16 2333 12/27/16 0625  BP: (!) 177/97 (!) 156/83 120/68 127/84  Pulse: (!) 106 (!) 101 (!) 119 (!) 108  Resp: 17 18 17  (!) 22  Temp: 99.9 F (37.7 C) 98.8 F (37.1 C) 99 F (37.2 C) 98.4 F (36.9 C)  TempSrc: Axillary Oral Oral Oral  SpO2: 100% 100% 96% 98%  Weight: 95 kg (209 lb 7 oz)   94.8 kg (208 lb 15.9 oz)  Height:        General: Pt is alert, awake, not in acute distress Cardiovascular: Slightly tachycardic, S1/S2 + Respiratory: Bilateral decreased breath sounds at bases Abdominal: Soft, NT, ND, bowel sounds + Extremities: no edema, no cyanosis    The results of significant diagnostics from this hospitalization (including imaging, microbiology, ancillary and laboratory) are listed below for reference.     Microbiology: No results found for this or any previous visit (from the past 240 hour(s)).   Labs: BNP (last 3 results) No results for input(s): BNP in the last 8760 hours. Basic Metabolic Panel:  Recent Labs Lab 12/21/16 0255 12/22/16 0315 12/24/16 0758 12/25/16 0615 12/26/16 0749 12/27/16 0754  NA 141 141 141 138 136 138  K 3.9 3.5 3.7 3.6 3.6 4.0  CL 111 110 105 104 105 102  CO2 21* 24 21* 20* 21* 25  GLUCOSE 104* 96 99 102* 105* 116*  BUN 6 9 11 16 7 10   CREATININE 0.61 0.85 1.70*  1.48* 0.93 1.16  CALCIUM 9.7 9.2 10.4* 10.5* 9.9 10.3  MG 2.1  --  2.1 2.2 2.1 2.1   Liver Function Tests:  Recent Labs Lab 12/21/16 0255 12/25/16 0615 12/26/16 0749  AST 45* 43* 41  ALT 63 49 42  ALKPHOS 48 50 47  BILITOT 0.5 1.1 0.9  PROT 7.8 9.2* 8.3*  ALBUMIN 4.3 5.6* 4.9    Recent Labs Lab 12/21/16 0255  LIPASE 49    Recent Labs Lab 12/21/16 0255  AMMONIA 23   CBC:  Recent Labs Lab 12/20/16 1220 12/24/16 0758 12/25/16 0615 12/26/16 0749 12/27/16 0754  WBC  --  10.4 15.8* 9.3 9.9  NEUTROABS  --  7.6 12.2* 6.4 7.1  HGB  --  14.8 14.1 14.1 14.5  HCT 39.9 43.1 40.9 40.3 42.4  MCV  --  100.7* 99.3 98.8 99.8  PLT  --  490* 518* 454*  456*   Cardiac Enzymes: No results for input(s): CKTOTAL, CKMB, CKMBINDEX, TROPONINI in the last 168 hours. BNP: Invalid input(s): POCBNP CBG:  Recent Labs Lab 12/20/16 2317 12/21/16 0737 12/22/16 0748  GLUCAP 110* 98 83   D-Dimer No results for input(s): DDIMER in the last 72 hours. Hgb A1c No results for input(s): HGBA1C in the last 72 hours. Lipid Profile No results for input(s): CHOL, HDL, LDLCALC, TRIG, CHOLHDL, LDLDIRECT in the last 72 hours. Thyroid function studies No results for input(s): TSH, T4TOTAL, T3FREE, THYROIDAB in the last 72 hours.  Invalid input(s): FREET3 Anemia work up No results for input(s): VITAMINB12, FOLATE, FERRITIN, TIBC, IRON, RETICCTPCT in the last 72 hours. Urinalysis No results found for: COLORURINE, APPEARANCEUR, LABSPEC, PHURINE, GLUCOSEU, HGBUR, BILIRUBINUR, KETONESUR, PROTEINUR, UROBILINOGEN, NITRITE, LEUKOCYTESUR Sepsis Labs Invalid input(s): PROCALCITONIN,  WBC,  LACTICIDVEN Microbiology No results found for this or any previous visit (from the past 240 hour(s)).   Time coordinating discharge:35 minutes  SIGNED:   Glade Lloyd, MD  [facility] 12/27/2016, 10:15 AM Pager: [phone]  If 7PM-7AM, please contact night-coverage [email or web address] Password  [email or web address]

## 2016-12-27 NOTE — Progress Notes (Signed)
Pt remains agitated, pacing halls, remains disoriented. Given PRN haldol, will continue to monitor at this time

## 2016-12-27 NOTE — Clinical Social Work Note (Signed)
Pt is ready for discharge, per MD. Psych recommendation changed to psychiatric inpatient admission, as of 6/22. CSW called [facility] and [facility]-no beds available. CSW faxed out clinicals to [place], [facility], [place], [place], [address], [place], [place], and [place]. CSW continuing to follow for discharge needs.   Corlis Hove, LCSWA, LCASA Clincial Social Work [facility] Coverage) [phone]

## 2016-12-28 DIAGNOSIS — F419 Anxiety disorder, unspecified: Secondary | ICD-10-CM

## 2016-12-28 DIAGNOSIS — R443 Hallucinations, unspecified: Secondary | ICD-10-CM

## 2016-12-28 NOTE — Progress Notes (Signed)
CSW received a call from [facility] Pt has been accepted by: [facility] Number for report is: [phone] Pt's unit/room/bed number will be: [address] [place] Accepting physician: Dr. Merideth Abbey  Pt can arrive after 10am am on 12/28/16  CSW will update RN and leave handoff for the daytime CSW.Dorothe Pea. Licet Dunphy, Francesco Sor, CSI Clinical Social Worker Ph: [phone]

## 2016-12-28 NOTE — Consult Note (Signed)
[facility] Consult   Reason for Consult: Alcohol abuse with withdrawal Referring Physician:  Dr. Starla Link Patient Identification: Brett Molina MRN:  [id number] Principal Diagnosis: Alcoholic intoxication without complication [facility]) Diagnosis:   Patient Active Problem List   Diagnosis Date Noted  . Alcoholic intoxication without complication ([place]) [Q00.867] 12/12/2016  . Hypokalemia [E87.6] 12/12/2016  . Alcoholic hepatitis without ascites [K70.10] 12/12/2016  . MDD (major depressive disorder) [F32.9] 12/12/2016    Total Time spent with patient: 45 minutes  Subjective:   Brett Molina is a 26 y.o. male patient admitted with acute alcohol withdrawal.    HPI: Brett Molina is a 26 years old single male initially admitted to behavioral health [facility] for alcohol detox treatment and later he was transferred to the [facility] for intensive care unit placement and also required ongoing treatment at this time. Patient was initially received Precedex treatment at the ICU and later required nitroglycerin drip for hypertensive urgency. Patient also has acute encephalopathy and delirium on June 17. Patient has been drinking alcohol since he was 26 years old and has been drinking heavily for the last 4 years and has one DUI about 3 and years ago and is driver's license was revoked and hoping to get it back in 2 months. Patient is also reportedly working for a [employer] company and staying with his cousin and brother. Patient reported his family drinks alcohol but mostly a socially. Patient reported his mother passed away in June 21, 2016 secondary to complications of the flu. Patient reportedly increased drinking and also not having any mobility secondary to normal license. Patient is able to cooperate with the inpatient treatment program and received alcohol detox treatment since admitted  on 12/11/2016. Patient stated that he likes to have a follow-up treatments with  the behavioral health Hospital but does not want to be inpatient treatment because he has been missing his work since admitted to the hospital cannot afford to lose working days any longer. Patient does not have a transportation middle of the day before participating in intensive outpatient program and is willing to participate in outpatient psychiatric treatment program at behavioral [place].  Patient denies active symptoms of depression, anxiety, suicidal/homicidal ideation, intention or plans he has no evidence of psychosis.   Past Psychiatric History: Patient has no history of alcohol detox [facility] of psychiatric services in the past.   12/28/2016: Interval history: Patient has been confused, delusional, hallucinating, restless and unable to function for the last 48 hours. Patient required antipsychotic medication to calm him down and currently is taking Seroquel 150 mg daily. Patient continued to be irritable, agitated and confused from time to time. Patient was given additional dose of Seroquel and Haldol by hospitalist with limited response. Patient recently received precedex alcohol treatment x twice and continued to have symptoms of encephalopathy.   Risk to Self: Is patient at risk for suicide?: No Risk to Others:   Prior Inpatient Therapy:   Prior Outpatient Therapy:    Past Medical History: History reviewed. No pertinent past medical history. History reviewed. No pertinent surgical history. Family History: No family history on file. Family Psychiatric  History: Patient reported he has a family members who are social drinkers but nobody is abusing or dependent besides. Patient denied family history of mental illness. Patient mother deceased in December 201 secondary to complication of flu. Patient father lives in [place].  Social History:  History  Alcohol Use  . 8.4 oz/week  . [place]  Standard drinks or equivalent per week     History  Drug Use  No    Social History   Social History  . Marital status: Single    Spouse name: N/A  . Number of children: N/A  . Years of education: N/A   Social History Main Topics  . Smoking status: Never Smoker  . Smokeless tobacco: Never Used  . Alcohol use 8.4 oz/week    14 Standard drinks or equivalent per week  . Drug use: No  . Sexual activity: Not Asked   Other Topics Concern  . None   Social History Narrative  . None   Additional Social History:    Allergies:  No Known Allergies  Labs:  Results for orders placed or performed during the hospital encounter of 12/11/16 (from the past 48 hour(s))  CBC with Differential/Platelet     Status: Abnormal   Collection Time: 12/27/16  7:54 AM  Result Value Ref Range   WBC 9.9 4.0 - 10.5 K/uL   RBC 4.25 4.22 - 5.81 MIL/uL   Hemoglobin 14.5 13.0 - 17.0 g/dL   HCT 42.4 39.0 - 52.0 %   MCV 99.8 78.0 - 100.0 fL   MCH 34.1 (H) 26.0 - 34.0 pg   MCHC 34.2 30.0 - 36.0 g/dL   RDW 13.0 11.5 - 15.5 %   Platelets 456 (H) 150 - 400 K/uL   Neutrophils Relative % 71 %   Neutro Abs 7.1 1.7 - 7.7 K/uL   Lymphocytes Relative 15 %   Lymphs Abs 1.5 0.7 - 4.0 K/uL   Monocytes Relative 12 %   Monocytes Absolute 1.2 (H) 0.1 - 1.0 K/uL   Eosinophils Relative 1 %   Eosinophils Absolute 0.1 0.0 - 0.7 K/uL   Basophils Relative 1 %   Basophils Absolute 0.1 0.0 - 0.1 K/uL  Basic metabolic panel     Status: Abnormal   Collection Time: 12/27/16  7:54 AM  Result Value Ref Range   Sodium 138 135 - 145 mmol/L   Potassium 4.0 3.5 - 5.1 mmol/L   Chloride 102 101 - 111 mmol/L   CO2 25 22 - 32 mmol/L   Glucose, Bld 116 (H) 65 - 99 mg/dL   BUN 10 6 - 20 mg/dL   Creatinine, Ser 1.16 0.61 - 1.24 mg/dL   Calcium 10.3 8.9 - 10.3 mg/dL   GFR calc non Af Amer >60 >60 mL/min   GFR calc Af Amer >60 >60 mL/min    Comment: (NOTE) The eGFR has been calculated using the CKD EPI equation. This calculation has not been validated in all clinical situations. eGFR's  persistently <60 mL/min signify possible Chronic Kidney Disease.    Anion gap 11 5 - 15  Magnesium     Status: None   Collection Time: 12/27/16  7:54 AM  Result Value Ref Range   Magnesium 2.1 1.7 - 2.4 mg/dL    Current Facility-Administered Medications  Medication Dose Route Frequency Provider Last Rate Last Dose  . acetaminophen (TYLENOL) tablet 650 mg  650 mg Oral Q6H PRN Derrill Center, NP   650 mg at 12/12/16 1800  . alum & mag hydroxide-simeth (MAALOX/MYLANTA) 200-200-20 MG/5ML suspension 30 mL  30 mL Oral Q4H PRN Derrill Center, NP      . amLODipine (NORVASC) tablet 10 mg  10 mg Oral Daily Erick Colace, NP   10 mg at 12/28/16 0837  . enoxaparin (LOVENOX) injection 40 mg  40 mg Subcutaneous Q24H Erlinda Hong,  Annamaria Boots, MD   40 mg at 12/27/16 1352  . folic acid (FOLVITE) tablet 1 mg  1 mg Oral Daily Robbie Lis, MD   1 mg at 12/28/16 8127  . gabapentin (NEURONTIN) capsule 100 mg  100 mg Oral QHS Starla Link, Kshitiz, MD   100 mg at 12/27/16 2157  . haloperidol (HALDOL) tablet 5 mg  5 mg Oral Q6H PRN Aline August, MD   5 mg at 12/27/16 2301   Or  . haloperidol lactate (HALDOL) injection 5 mg  5 mg Intravenous Q6H PRN Alekh, Kshitiz, MD      . hydrALAZINE (APRESOLINE) injection 10 mg  10 mg Intravenous Q6H PRN Alekh, Kshitiz, MD      . hydrALAZINE (APRESOLINE) tablet 50 mg  50 mg Oral Q8H Alekh, Kshitiz, MD   50 mg at 12/28/16 0508  . labetalol (NORMODYNE) tablet 300 mg  300 mg Oral BID Aline August, MD   300 mg at 12/28/16 0837  . LORazepam (ATIVAN) injection 1 mg  1 mg Intramuscular Q6H PRN Ambrose Finland, MD   1 mg at 12/27/16 1514  . magnesium hydroxide (MILK OF MAGNESIA) suspension 30 mL  30 mL Oral Daily PRN Derrill Center, NP      . multivitamin with minerals tablet 1 tablet  1 tablet Oral Daily Robbie Lis, MD   1 tablet at 12/28/16 [phone]  . ondansetron (ZOFRAN) tablet 4 mg  4 mg Oral Q6H PRN Robbie Lis, MD       Or  . ondansetron [facility]) injection 4 mg  4 mg  Intravenous Q6H PRN Robbie Lis, MD      . QUEtiapine (SEROQUEL) tablet 50 mg  50 mg Oral TID Ambrose Finland, MD   50 mg at 12/28/16 0836  . sodium chloride flush (NS) 0.9 % injection 3 mL  3 mL Intravenous Q12H Robbie Lis, MD   3 mL at 12/25/16 1000  . thiamine (VITAMIN B-1) tablet 100 mg  100 mg Oral Daily Robbie Lis, MD   100 mg at 12/28/16 0174    Musculoskeletal: Strength & Muscle Tone: within normal limits Gait & Station: unsteady Patient leans: N/A  Psychiatric Specialty Exam: Physical ExamAs per history and physical   ROS  Blood pressure 122/63, pulse 93, temperature 99.2 F (37.3 C), temperature source Oral, resp. rate 15, height _0  (1.88 m), weight 94.7 kg (208 lb 12.4 oz), SpO2 99 %.Body mass index is 26.81 kg/m.  General Appearance: Casual and Fairly Groomed  Eye Contact:  Fair  Speech:  Clear and Coherent  Volume:  Normal  Mood:  Anxious and Depressed, restless   Affect:  Congruent and Depressed  Thought Process:  Disorganized and Irrelevant and confused   Orientation:  Full (Time, Place, and Person)  Thought Content:  Logical  Suicidal Thoughts:  No  Homicidal Thoughts:  No  Memory:  Immediate;   Fair  Judgement:  Fair  Insight:  Fair  Psychomotor Activity:  Restlessness  Concentration:  Concentration: Fair  Recall:  NA  Fund of Knowledge:  Fair  Language:  Fair  Akathisia:  Negative  Handed:  Right  AIMS (if indicated):     Assets:  Communication Skills Desire for Improvement  ADL's:  Intact  Cognition:  WNL  Sleep:      Treatment Plan Summary: 26 years old male with alcohol abuse with intoxication and withdrawal admitted initially at behavioral [place] and then transferred to [facility] hospital for detox treatment.  Patient has a mild symptoms of depression anxiety and hand tremors but no suicidal/homicidal ideation, intention or plans. Patient has no evidence of psychosis.  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  Continue Neurontin 100 mg daily at bedtime As needed for anxiety  Case discussed with the unit CSW and patient will be referred to the outpatient psychiatric medication management and also possibly grief counseling  when medically stable.   Disposition: Recommend psychiatric Inpatient admission when medically cleared. Supportive therapy provided about ongoing stressors.  Ambrose Finland, MD 12/28/2016 3:44 PM

## 2016-12-28 NOTE — Care Management Note (Signed)
Case Management Note  Patient Details  Name: Brett Molina MRN: [id number] Date of Birth: Jul 11, 1990  Subjective/Objective:                    Action/Plan:dC IP Psych.   Expected Discharge Date:  12/27/16               Expected Discharge Plan:  Psychiatric Hospital  In-House Referral:  Clinical Social Work  Discharge planning Services  CM Consult  Post Acute Care Choice:    Choice offered to:     DME Arranged:    DME Agency:     HH Arranged:    HH Agency:     Status of Service:  Completed, signed off  If discussed at [employer], dates discussed:    Additional Comments:  Lanier Clam, RN 12/28/2016, 10:47 AM

## 2016-12-28 NOTE — Progress Notes (Addendum)
CSW following to assist with patient discharge to  [facility].  CSW will inform medical staff when a bed is available.   CSW spoke with Dr.Pucilowska at [facility] about patient and informed psychiatrist about her concerns, patient current condition possibly delirium. Psychiatrist  Reports he put in note from visit today. Called called to update Dr. Wynn Banker, no answer.  CSW will continue to seek placement.   Vivi Barrack, Theresia Majors, MSW Clinical Social Worker 5E and Psychiatric Service Line [phone] 12/28/2016  10:57 AM

## 2016-12-28 NOTE — Progress Notes (Addendum)
Patient ID: Brett Molina, male   DOB: 04-15-1991, 26 y.o.   MRN: [id number]  PROGRESS NOTE    Brett Molina  XBM:[id number] DOB: 29-Sep-1990 DOA: 12/11/2016 PCP: Patient, No Pcp Per   Brief Narrative:  26 y.o.malewith medical history significant for alcohol abuse, depression. Patient presented to ED on 6/8 for evaluation of depression and alcoholism but per psych eval in ED, pt at risk for withdrawals so not stable to come to [facility].   Patient went into full blown withdrawal on 6/9 and was extremely confused and agitated, he wasstarted on precedex drip, nitro drip for elevated BP He wastransferred to icu under critical care service , he received prn ativan, haldol While in the ICU, he wasweaned off precedex drip on 6/15, and transferred back to hospitalist service on 6/16 am.  Patient had severe agitation on 6/16night, hadto be transferredback to iuc and started back on precedex. His condition improved, so was transferred out of ICU on 6/19. Benzos have been discontinued; Seroquel is being tapered. His blood pressure has improved with adjustment of his antihypertensive regimen.   Patient has been having worseningintermittent hallucinations.Psychiatry is following the patient. Psychiatry recommended inpatient psychiatric hospitalization once medically stable. His blood pressure is improved.   We're waiting for bed at the psychiatric facility for the patient to be discharged.  Assessment & Plan:   Principal Problem:   Alcoholic intoxication without complication (HCC) Active Problems:   Hypokalemia   Alcoholic hepatitis without ascites   MDD (major depressive disorder)   Hallucinations, auditory and visual both - Psychiatryis following. Continue meds as per psychiatryrecommendations. Continue bedside sitter for safety.  - Discharge to inpatient psychiatry facility once bed is available as per psychiatry recommendations. Patient is medically stable for discharge. - Patient's was  supposed to be discharged yesterday but no bed is available.  Acute encephalopathy with agitated delirium - improving; mental status still fluctuating. Monitor off Benzos; continue Seroquel  Alcoholic intoxication ,severe alcohol withdrawal required ICU Care from 6/9 to 6/15 -Alcohol level 493 in ED -Patient Brett Molina into full blown withdrawal 6/9 and was extremely confused and agitated, he was started on precedex drip, nitro drip for elevated BP He was transferred to icu -he received prn ativan, haldol While in the ICU, he wasweaned off precedex drip on 6/15, and transferred back to hospitalist service pn 6/16.had severe agitation on 6/16night, had to be transferredback to ICUand started back on precedex - Continue ciwa icu protocol, multivitamin, thiamine and folic acid,  - Transferred out of ICU on 6/19; off Precedex - Alcohol withdrawal probably has resolved so far  Transaminitis - probably from alcohol abuse; improved  Fatty liver by [place] w/out stones.   Hypertensive urgency -Blood pressureimproved. Intermittently elevated. Continue labetalol  300 mgby mouth twice a day; continuehydralazine 50 mg by mouth 3 times a day.Continue oral amlodipine. Monitor blood pressure.  Macrocytosis- likely related to alcohol use. Trend CBC.   Depression/anxiety -He was brought to the ED by his family due to concerning alcohol use/ depression/anxiety which start to affect his life/work --Currently denies SI/HI - Psych consultation has been appreciated. Follow further recommendations  Overweight:  Body mass index is 27.99 kg/m.  DVT prophylaxis:Lovenox Code Status:full code  Family Communication:none present at bedside  Disposition Plan:Discharge to inpatient psychiatric facility once bed is available. Follow-up with social worker Consultants:Psychiatry; critical care   Procedures:None Antimicrobials:None  Subjective: Patient seen and examined at  bedside. He is calm this morning and answers some questions. He apparently was slightly  agitated overnight. No overnight fever, nausea, vomiting  Objective: Vitals:   12/28/16 0557 12/28/16 0836 12/28/16 0936 12/28/16 1100  BP: (!) 151/121 (!) 164/107 (!) 163/102   Pulse: 98 (!) 106 92   Resp: 20     Temp: 98.5 F (36.9 C)     TempSrc: Oral     SpO2: 100%     Weight:    94.7 kg (208 lb 12.4 oz)  Height:        Intake/Output Summary (Last 24 hours) at 12/28/16 1310 Last data filed at 12/28/16 1158  Gross per 24 hour  Intake              120 ml  Output                0 ml  Net              120 ml   Filed Weights   12/26/16 0513 12/27/16 0625 12/28/16 1100  Weight: 95 kg (209 lb 7 oz) 94.8 kg (208 lb 15.9 oz) 94.7 kg (208 lb 12.4 oz)    Examination:  General exam: Appears calm and comfortable  Respiratory system: Bilateral decreased breath sound at bases Cardiovascular system: S1 & S2 heard,Slightly tachycardic. Gastrointestinal system: Abdomen is nondistended, soft and nontender. Normal bowel sounds heard. Extremities: No cyanosis, clubbing, edema     Data Reviewed: I have personally reviewed following labs and imaging studies  CBC:  Recent Labs Lab 12/24/16 0758 12/25/16 0615 12/26/16 0749 12/27/16 0754  WBC 10.4 15.8* 9.3 9.9  NEUTROABS 7.6 12.2* 6.4 7.1  HGB 14.8 14.1 14.1 14.5  HCT 43.1 40.9 40.3 42.4  MCV 100.7* 99.3 98.8 99.8  PLT 490* 518* 454* 456*   Basic Metabolic Panel:  Recent Labs Lab 12/22/16 0315 12/24/16 0758 12/25/16 0615 12/26/16 0749 12/27/16 0754  NA 141 141 138 136 138  K 3.5 3.7 3.6 3.6 4.0  CL 110 105 104 105 102  CO2 24 21* 20* 21* 25  GLUCOSE 96 99 102* 105* 116*  BUN 9 11 16 7 10   CREATININE 0.85 1.70* 1.48* 0.93 1.16  CALCIUM 9.2 10.4* 10.5* 9.9 10.3  MG  --  2.1 2.2 2.1 2.1   GFR: Estimated Creatinine Clearance: 110.2 mL/min (by C-G formula based on SCr of 1.16 mg/dL). Liver Function Tests:  Recent Labs Lab  12/25/16 0615 12/26/16 0749  AST 43* 41  ALT 49 42  ALKPHOS 50 47  BILITOT 1.1 0.9  PROT 9.2* 8.3*  ALBUMIN 5.6* 4.9   No results for input(s): LIPASE, AMYLASE in the last 168 hours. No results for input(s): AMMONIA in the last 168 hours. Coagulation Profile: No results for input(s): INR, PROTIME in the last 168 hours. Cardiac Enzymes: No results for input(s): CKTOTAL, CKMB, CKMBINDEX, TROPONINI in the last 168 hours. BNP (last 3 results) No results for input(s): PROBNP in the last 8760 hours. HbA1C: No results for input(s): HGBA1C in the last 72 hours. CBG:  Recent Labs Lab 12/22/16 0748  GLUCAP 83   Lipid Profile: No results for input(s): CHOL, HDL, LDLCALC, TRIG, CHOLHDL, LDLDIRECT in the last 72 hours. Thyroid Function Tests: No results for input(s): TSH, T4TOTAL, FREET4, T3FREE, THYROIDAB in the last 72 hours. Anemia Panel: No results for input(s): VITAMINB12, FOLATE, FERRITIN, TIBC, IRON, RETICCTPCT in the last 72 hours. Sepsis Labs: No results for input(s): PROCALCITON, LATICACIDVEN in the last 168 hours.  No results found for this or any previous visit (from the past 240 hour(s)).  Radiology Studies: No results found.      Scheduled Meds: . amLODipine  10 mg Oral Daily  . enoxaparin (LOVENOX) injection  40 mg Subcutaneous Q24H  . folic acid  1 mg Oral Daily  . gabapentin  100 mg Oral QHS  . hydrALAZINE  50 mg Oral Q8H  . labetalol  300 mg Oral BID  . multivitamin with minerals  1 tablet Oral Daily  . QUEtiapine  50 mg Oral TID  . sodium chloride flush  3 mL Intravenous Q12H  . thiamine  100 mg Oral Daily   Continuous Infusions:   LOS: 16 days        Glade Lloyd, MD [facility] Pager [phone]  If 7PM-7AM, please contact night-coverage [email or web address] Password [email or web address] 12/28/2016, 1:10 PM

## 2016-12-28 NOTE — Progress Notes (Signed)
Pt had an episode of violent shaking and yelling. Went in to assess him and found him awake, able to answer questions, but was not oriented to place or situation. Pt states that he "had a bad dream" will continue to monitor at this time

## 2016-12-29 NOTE — Progress Notes (Addendum)
12:21 pm CSW called for [facility] transport, left voicemail. Will continue to assist with discharge.  2:00 [facility] called and confirmed they will transport patient today, not ETA. CSW provided him with nurse contact number. He will call when in route to hospital.  IV[facility] paperwork in transport packet on patient chart.    Vivi Barrack, Theresia Majors, MSW Clinical Social Worker 5E and Psychiatric Service Line [phone] 12/29/2016  12:56 PM

## 2016-12-29 NOTE — Progress Notes (Signed)
Patient ID: Brett Molina, male   DOB: May 06, 1991, 26 y.o.   MRN: [id number]  PROGRESS NOTE    Brett Molina  WUJ:[id number] DOB: 05/29/1991 DOA: 12/11/2016 PCP: Patient, No Pcp Per   Brief Narrative:  26 y.o.malewith medical history significant for alcohol abuse, depression. Patient presented to ED on 6/8 for evaluation of depression and alcoholism but per psych eval in ED, pt at risk for withdrawals so not stable to come to [facility].   Patient went into full blown withdrawal on 6/9 and was extremely confused and agitated, he wasstarted on precedex drip, nitro drip for elevated BP He wastransferred to icu under critical care service , he received prn ativan, haldol While in the ICU, he wasweaned off precedex drip on 6/15, and transferred back to hospitalist service on 6/16 am.  Patient had severe agitation on 6/16night, hadto be transferredback to iuc and started back on precedex. His condition improved, so was transferred out of ICU on 6/19. Benzos have been discontinued; Seroquel is being tapered. His blood pressure has improved with adjustment of his antihypertensive regimen.   Patient has been having worseningintermittent hallucinations.Psychiatry is following the patient. Psychiatryrecommended inpatient psychiatric hospitalization once medically stable. His blood pressure isimproved.   We're waiting for bed at the psychiatric facility for the patient to be discharged.   Assessment & Plan:   Principal Problem:   Alcoholic intoxication without complication (HCC) Active Problems:   Hypokalemia   Alcoholic hepatitis without ascites   MDD (major depressive disorder)  Hallucinations, auditory and visual both - Psychiatryis following. Continue meds as per psychiatryrecommendations. Continue bedside sitter for safety.  - Discharge to inpatient psychiatry facility once bed is available as per psychiatryrecommendations. Patient is medically stable for discharge. - Patient is  waiting for a bed to be discharged to inpatient psychiatry hospital  Acute encephalopathy with agitated delirium - improving; mental status still fluctuating. Monitor off Benzos; continue Seroquel  Alcoholic intoxication ,severe alcohol withdrawal required ICU Care from 6/9 to 6/15 -Alcohol level 493 in ED -Patient [name] into full blown withdrawal 6/9 and was extremely confused and agitated, he was started on precedex drip, nitro drip for elevated BP He was transferred to icu -he received prn ativan, haldol While in the ICU, he wasweaned off precedex drip on 6/15, and transferred back to hospitalist service pn 6/16.had severe agitation on 6/16night, had to be transferredback to ICUand started back on precedex - Continue ciwa icu protocol, multivitamin, thiamine and folic acid,  - Transferred out of ICU on 6/19; off Precedex - Alcohol withdrawal probably has resolved so far  Transaminitis - probably from alcohol abuse; improved  Fatty liver by [place] -[place] w/out stones.   Hypertensive urgency -Blood pressureimproved. Intermittently elevated. Continuelabetalol 300 mgby mouth twice a day;continuehydralazine 50 mg by mouth 3 times a day.Continue oral amlodipine. Monitor blood pressure.  Macrocytosis- likely related to alcohol use. Trend CBC.   Depression/anxiety -He was brought to the ED by his family due to concerning alcohol use/ depression/anxiety which start to affect his life/work --Currently denies SI/HI - Psych consultation has been appreciated. Follow further recommendations  Overweight:  Body mass index is 27.99 kg/m.  DVT prophylaxis:Lovenox Code Status:full code  Family Communication:none present at bedside  Disposition Plan:Discharge to inpatient psychiatric facility once bed is available. Follow-up with social worker Consultants:Psychiatry; critical care   Procedures:None Antimicrobials:None   Subjective: Patient seen and  examined at bedside. No overnight fever, nausea, vomiting.  Objective: Vitals:   12/28/16 2029 12/29/16 9562 12/29/16 0854 12/29/16  1031  BP: (!) 147/95 (!) 152/95 (!) 152/87 (!) 155/95  Pulse: (!) 111 90 99 (!) 115  Resp: 16 16 17    Temp: 99.4 F (37.4 C) 99 F (37.2 C) 99.1 F (37.3 C)   TempSrc: Oral Oral Oral   SpO2: 100% 100% 100%   Weight:  93.5 kg (206 lb 2.1 oz)    Height:        Intake/Output Summary (Last 24 hours) at 12/29/16 1214 Last data filed at 12/29/16 5498  Gross per 24 hour  Intake              240 ml  Output                0 ml  Net              240 ml   Filed Weights   12/27/16 0625 12/28/16 1100 12/29/16 0528  Weight: 94.8 kg (208 lb 15.9 oz) 94.7 kg (208 lb 12.4 oz) 93.5 kg (206 lb 2.1 oz)    Examination:  General exam: Appears calm and comfortable  Respiratory system: Bilateral decreased breath sound at bases Cardiovascular system: S1 & S2 heard,Slightly tachycardic  Gastrointestinal system: Abdomen is nondistended, soft and nontender. Normal bowel sounds heard. Extremities: No cyanosis, clubbing, edema       Data Reviewed: I have personally reviewed following labs and imaging studies  CBC:  Recent Labs Lab 12/24/16 0758 12/25/16 0615 12/26/16 0749 12/27/16 0754  WBC 10.4 15.8* 9.3 9.9  NEUTROABS 7.6 12.2* 6.4 7.1  HGB 14.8 14.1 14.1 14.5  HCT 43.1 40.9 40.3 42.4  MCV 100.7* 99.3 98.8 99.8  PLT 490* 518* 454* 456*   Basic Metabolic Panel:  Recent Labs Lab 12/24/16 0758 12/25/16 0615 12/26/16 0749 12/27/16 0754  NA 141 138 136 138  K 3.7 3.6 3.6 4.0  CL 105 104 105 102  CO2 21* 20* 21* 25  GLUCOSE 99 102* 105* 116*  BUN 11 16 7 10   CREATININE 1.70* 1.48* 0.93 1.16  CALCIUM 10.4* 10.5* 9.9 10.3  MG 2.1 2.2 2.1 2.1   GFR: Estimated Creatinine Clearance: 110.2 mL/min (by C-G formula based on SCr of 1.16 mg/dL). Liver Function Tests:  Recent Labs Lab 12/25/16 0615 12/26/16 0749  AST 43* 41  ALT 49 42  ALKPHOS 50  47  BILITOT 1.1 0.9  PROT 9.2* 8.3*  ALBUMIN 5.6* 4.9   No results for input(s): LIPASE, AMYLASE in the last 168 hours. No results for input(s): AMMONIA in the last 168 hours. Coagulation Profile: No results for input(s): INR, PROTIME in the last 168 hours. Cardiac Enzymes: No results for input(s): CKTOTAL, CKMB, CKMBINDEX, TROPONINI in the last 168 hours. BNP (last 3 results) No results for input(s): PROBNP in the last 8760 hours. HbA1C: No results for input(s): HGBA1C in the last 72 hours. CBG: No results for input(s): GLUCAP in the last 168 hours. Lipid Profile: No results for input(s): CHOL, HDL, LDLCALC, TRIG, CHOLHDL, LDLDIRECT in the last 72 hours. Thyroid Function Tests: No results for input(s): TSH, T4TOTAL, FREET4, T3FREE, THYROIDAB in the last 72 hours. Anemia Panel: No results for input(s): VITAMINB12, FOLATE, FERRITIN, TIBC, IRON, RETICCTPCT in the last 72 hours. Sepsis Labs: No results for input(s): PROCALCITON, LATICACIDVEN in the last 168 hours.  No results found for this or any previous visit (from the past 240 hour(s)).       Radiology Studies: No results found.      Scheduled Meds: . amLODipine  10  mg Oral Daily  . enoxaparin (LOVENOX) injection  40 mg Subcutaneous Q24H  . folic acid  1 mg Oral Daily  . gabapentin  100 mg Oral QHS  . hydrALAZINE  50 mg Oral Q8H  . labetalol  300 mg Oral BID  . multivitamin with minerals  1 tablet Oral Daily  . QUEtiapine  50 mg Oral TID  . sodium chloride flush  3 mL Intravenous Q12H  . thiamine  100 mg Oral Daily   Continuous Infusions:   LOS: 17 days        Glade Lloyd, MD [facility] Pager [phone]  If 7PM-7AM, please contact night-coverage [email or web address] Password [facility] 12/29/2016, 12:14 PM

## 2016-12-29 NOTE — Progress Notes (Signed)
CSW assisting with patient discharge to [facility] today. For patient safety during transport psychiatrist and physician agreeable to IVC. CSW assisted  physician with completing documentation. CSW faxed to [facility] office, confirmed received. Patient to be served. Then transported by [facility] to [place]. CSW informed patient brother about plan at discharge. He plans to visit the patient today and have physician complete FMLA paperwork for the patient.   Vivi Barrack, Theresia Majors, MSW Clinical Social Worker 5E and Psychiatric Service Line [phone] 12/29/2016  10:52 AM

## 2016-12-29 NOTE — Progress Notes (Signed)
Called report to [facility]. Gave report to RN.

## 2017-01-12 ENCOUNTER — Emergency Department (HOSPITAL_BASED_OUTPATIENT_CLINIC_OR_DEPARTMENT_OTHER)
Admission: EM | Admit: 2017-01-12 | Discharge: 2017-01-12 | Disposition: A | Discharge status: Home / Self Care | Payer: Managed Care, Other (non HMO) | Source: Home / Self Care

## 2017-01-12 ENCOUNTER — Emergency Department (HOSPITAL_COMMUNITY)
Admission: EM | Admit: 2017-01-12 | Discharge: 2017-01-12 | Disposition: A | Discharge status: Home / Self Care | Payer: Managed Care, Other (non HMO) | Attending: Emergency Medicine | Admitting: Emergency Medicine

## 2017-01-12 ENCOUNTER — Encounter (HOSPITAL_COMMUNITY): Payer: Self-pay

## 2017-01-12 DIAGNOSIS — R000 Tachycardia, unspecified: Secondary | ICD-10-CM | POA: Insufficient documentation

## 2017-01-12 DIAGNOSIS — Z79899 Other long term (current) drug therapy: Secondary | ICD-10-CM | POA: Insufficient documentation

## 2017-01-12 DIAGNOSIS — F1010 Alcohol abuse, uncomplicated: Secondary | ICD-10-CM | POA: Insufficient documentation

## 2017-01-12 DIAGNOSIS — M79604 Pain in right leg: Secondary | ICD-10-CM | POA: Diagnosis not present

## 2017-01-12 DIAGNOSIS — M79609 Pain in unspecified limb: Secondary | ICD-10-CM

## 2017-01-12 DIAGNOSIS — R202 Paresthesia of skin: Secondary | ICD-10-CM | POA: Insufficient documentation

## 2017-01-12 DIAGNOSIS — M79605 Pain in left leg: Secondary | ICD-10-CM | POA: Diagnosis present

## 2017-01-12 HISTORY — DX: Alcohol abuse, uncomplicated: F10.10

## 2017-01-12 LAB — URINALYSIS, ROUTINE W REFLEX MICROSCOPIC
Bacteria, UA: NONE SEEN
Bilirubin Urine: NEGATIVE
GLUCOSE, UA: NEGATIVE mg/dL
Hgb urine dipstick: NEGATIVE
Ketones, ur: NEGATIVE mg/dL
Leukocytes, UA: NEGATIVE
Nitrite: NEGATIVE
PH: 9 — AB (ref 5.0–8.0)
PROTEIN: 30 mg/dL — AB
SQUAMOUS EPITHELIAL / LPF: NONE SEEN
Specific Gravity, Urine: 1.005 (ref 1.005–1.030)

## 2017-01-12 LAB — ETHANOL: ALCOHOL ETHYL (B): 277 mg/dL — AB (ref <5)

## 2017-01-12 LAB — BASIC METABOLIC PANEL
Anion gap: 15 (ref 5–15)
CHLORIDE: 103 mmol/L (ref 101–111)
CO2: 23 mmol/L (ref 22–32)
CREATININE: 0.78 mg/dL (ref 0.61–1.24)
Calcium: 9.2 mg/dL (ref 8.9–10.3)
GFR calc Af Amer: >60 mL/min (ref >60)
GFR calc non Af Amer: >60 mL/min (ref >60)
GLUCOSE: 100 mg/dL — AB (ref 65–99)
Potassium: 3.9 mmol/L (ref 3.5–5.1)
Sodium: 141 mmol/L (ref 135–145)

## 2017-01-12 LAB — CBC
HCT: 38.7 % — ABNORMAL LOW (ref 39.0–52.0)
Hemoglobin: 13.8 g/dL (ref 13.0–17.0)
MCH: 33.2 pg (ref 26.0–34.0)
MCHC: 35.7 g/dL (ref 30.0–36.0)
MCV: 93 fL (ref 78.0–100.0)
PLATELETS: 357 10*3/uL (ref 150–400)
RBC: 4.16 MIL/uL — ABNORMAL LOW (ref 4.22–5.81)
RDW: 12 % (ref 11.5–15.5)
WBC: 4.3 10*3/uL (ref 4.0–10.5)

## 2017-01-12 LAB — RAPID URINE DRUG SCREEN, HOSP PERFORMED
AMPHETAMINES: NOT DETECTED
BENZODIAZEPINES: NOT DETECTED
Barbiturates: NOT DETECTED
Cocaine: NOT DETECTED
OPIATES: NOT DETECTED
Tetrahydrocannabinol: NOT DETECTED

## 2017-01-12 LAB — HEPATIC FUNCTION PANEL
ALBUMIN: 4.7 g/dL (ref 3.5–5.0)
ALK PHOS: 51 U/L (ref 38–126)
ALT: 105 U/L — AB (ref 17–63)
AST: 107 U/L — ABNORMAL HIGH (ref 15–41)
Bilirubin, Direct: <0.1 mg/dL — ABNORMAL LOW (ref 0.1–0.5)
Total Bilirubin: 0.4 mg/dL (ref 0.3–1.2)
Total Protein: 7.8 g/dL (ref 6.5–8.1)

## 2017-01-12 LAB — CK: Total CK: 210 U/L (ref 49–397)

## 2017-01-12 LAB — CBG MONITORING, ED: Glucose-Capillary: 140 mg/dL — ABNORMAL HIGH (ref 65–99)

## 2017-01-12 NOTE — Progress Notes (Signed)
Preliminary results by tech - Venous Duplex Lower Ext. Completed. Negative for deep and superficial vein thrombosis in both legs.  Marijke Guadiana, BS, RDMS, RVT  

## 2017-01-12 NOTE — ED Notes (Signed)
2 IV insertion attempts with success. Another RN will attempt.

## 2017-01-12 NOTE — ED Provider Notes (Signed)
[facility]-EMERGENCY DEPT Provider Note   CSN: [id number] Arrival date & time: 01/12/17  1358     History   Chief Complaint Chief Complaint  Patient presents with  . Weakness  . Leg Pain    HPI Brett Molina is a 26 y.o. male.  HPI Patient presents with 2 weeks of bilateral lower extremity pain that he describes as pins and needles-like. Recently was admitted for alcohol detox. States he has been lying in bed for extended period of time and this is when the pain is slightly worsened. He started back drinking alcohol several days ago to manage his pain. States he's been drinking a pint daily. Last drink alcohol yesterday evening. Denies any other drugs. Denies chest pain or shortness of breath. Denies tremor, auditory or visual hallucinations. Past Medical History:  Diagnosis Date  . ETOH abuse     Patient Active Problem List   Diagnosis Date Noted  . Alcoholic intoxication without complication (HCC) 12/12/2016  . Hypokalemia 12/12/2016  . Alcoholic hepatitis without ascites 12/12/2016  . MDD (major depressive disorder) 12/12/2016    History reviewed. No pertinent surgical history.     Home Medications    Prior to Admission medications   Medication Sig Start Date End Date Taking? Authorizing Provider  amLODipine (NORVASC) 10 MG tablet Take 1 tablet (10 mg total) by mouth daily. 12/27/16  Yes Glade Lloyd, MD  folic acid (FOLVITE) 1 MG tablet Take 1 tablet (1 mg total) by mouth daily. 12/27/16  Yes Glade Lloyd, MD  gabapentin (NEURONTIN) 100 MG capsule Take 1 capsule (100 mg total) by mouth at bedtime. 12/27/16  Yes Glade Lloyd, MD  hydrALAZINE (APRESOLINE) 50 MG tablet Take 1 tablet (50 mg total) by mouth every 8 (eight) hours. 12/27/16  Yes Glade Lloyd, MD  labetalol (NORMODYNE) 300 MG tablet Take 1 tablet (300 mg total) by mouth 2 (two) times daily. 12/27/16  Yes Glade Lloyd, MD  Multiple Vitamin (MULTIVITAMIN WITH MINERALS) TABS tablet Take 1 tablet by mouth daily.  12/27/16  Yes Glade Lloyd, MD  QUEtiapine (SEROQUEL) 50 MG tablet Take 1 tablet (50 mg total) by mouth 3 (three) times daily. 12/27/16  Yes Glade Lloyd, MD  thiamine 100 MG tablet Take 1 tablet (100 mg total) by mouth daily. 12/27/16  Yes Glade Lloyd, MD  gabapentin (NEURONTIN) 300 MG capsule Take 300 mg by mouth 3 (three) times daily.  01/11/17   [provider]  OLANZapine (ZYPREXA) 5 MG tablet Take 5 mg by mouth at bedtime.  01/11/17   [provider]    Family History No family history on file.  Social History Social History  Substance Use Topics  . Smoking status: Never Smoker  . Smokeless tobacco: Never Used  . Alcohol use 8.4 oz/week    14 Standard drinks or equivalent per week     Comment: Patient drank vodka yesterday     Allergies   Patient has no known allergies.   Review of Systems Review of Systems  Constitutional: Negative for chills, fatigue and fever.  Respiratory: Negative for cough, shortness of breath and wheezing.   Cardiovascular: Negative for chest pain, palpitations and leg swelling.  Gastrointestinal: Negative for abdominal pain, constipation, diarrhea, nausea and vomiting.  Genitourinary: Negative for dysuria and flank pain.  Musculoskeletal: Positive for arthralgias and myalgias. Negative for back pain, joint swelling, neck pain and neck stiffness.  Skin: Negative for rash and wound.  Neurological: Negative for dizziness, tremors, syncope, weakness, light-headedness and numbness.  All other  systems reviewed and are negative.    Physical Exam Updated Vital Signs BP (!) 153/101   Pulse (!) 106   Temp 98.3 F (36.8 C) (Oral)   Resp 18   Ht 6\' 2"  (1.88 m)   Wt 93.4 kg (206 lb)   SpO2 100%   BMI 26.45 kg/m   Physical Exam  Constitutional: He is oriented to person, place, and time. He appears well-developed and well-nourished. No distress.  HENT:  Head: Normocephalic and atraumatic.  Mouth/Throat: Oropharynx is clear and  moist. No oropharyngeal exudate.  Eyes: EOM are normal. Pupils are equal, round, and reactive to light.  Neck: Normal range of motion. Neck supple.  No meningismus  Cardiovascular: Regular rhythm.  Exam reveals no gallop and no friction rub.   No murmur heard. Tachycardia  Pulmonary/Chest: Effort normal and breath sounds normal. No respiratory distress. He has no wheezes. He has no rales. He exhibits no tenderness.  Abdominal: Soft. Bowel sounds are normal. There is no tenderness. There is no rebound and no guarding.  Musculoskeletal: Normal range of motion. He exhibits no edema or tenderness.  No lower extremities swelling, asymmetry or tenderness. Full range of motion of all joints without effusion, deformity, warmth or erythema. Distal pulses are 2+.  Neurological: He is alert and oriented to person, place, and time.  Moving all extremities without focal deficit. Sensation fully intact.  Skin: Skin is warm and dry. Capillary refill takes less than 2 seconds. No rash noted. No erythema.  Psychiatric: He has a normal mood and affect. His behavior is normal.  Nursing note and vitals reviewed.    ED Treatments / Results  Labs (all labs ordered are listed, but only abnormal results are displayed) Labs Reviewed  BASIC METABOLIC PANEL - Abnormal; Notable for the following:       Result Value   Glucose, Bld 100 (*)    BUN <5 (*)    All other components within normal limits  CBC - Abnormal; Notable for the following:    RBC 4.16 (*)    HCT 38.7 (*)    All other components within normal limits  URINALYSIS, ROUTINE W REFLEX MICROSCOPIC - Abnormal; Notable for the following:    Color, Urine STRAW (*)    pH 9.0 (*)    Protein, ur 30 (*)    All other components within normal limits  ETHANOL - Abnormal; Notable for the following:    Alcohol, Ethyl (B) 277 (*)    All other components within normal limits  HEPATIC FUNCTION PANEL - Abnormal; Notable for the following:    AST 107 (*)    ALT  105 (*)    Bilirubin, Direct <0.1 (*)    All other components within normal limits  CBG MONITORING, ED - Abnormal; Notable for the following:    Glucose-Capillary 140 (*)    All other components within normal limits  CK  RAPID URINE DRUG SCREEN, HOSP PERFORMED    EKG  EKG Interpretation None       Radiology No results found.  Procedures Procedures (including critical care time)  Medications Ordered in ED Medications - No data to display   Initial Impression / Assessment and Plan / ED Course  I have reviewed the triage vital signs and the nursing notes.  Pertinent labs & imaging results that were available during my care of the patient were reviewed by me and considered in my medical decision making (see chart for details).     Doppler studies  negative for DVT. Mild tachycardia which is improved. Patient is ambulating without any difficulty. Steady gait. Advised follow-up with community health and wellness. Continue gabapentin for peripheral neuropathy. Return precautions given.  Final Clinical Impressions(s) / ED Diagnoses   Final diagnoses:  Paresthesia of both legs  Alcohol abuse    New Prescriptions Discharge Medication List as of 01/12/2017  8:13 PM       Loren Racer, MD 01/12/17 2300

## 2017-01-12 NOTE — ED Triage Notes (Addendum)
Patient states he was released from a alcohol detox program 5 days ago. Patient reports that he has not been eating or drinking due to stress. Patient c/o weakness.  Patient states he drank vodka yesterday to get of the pain in his legs. Patient states he was self medicating.

## 2017-01-23 ENCOUNTER — Emergency Department (HOSPITAL_COMMUNITY)
Admission: EM | Admit: 2017-01-23 | Discharge: 2017-01-24 | Disposition: A | Discharge status: Home / Self Care | Payer: Managed Care, Other (non HMO) | Attending: Emergency Medicine | Admitting: Emergency Medicine

## 2017-01-23 ENCOUNTER — Emergency Department (HOSPITAL_COMMUNITY): Payer: Managed Care, Other (non HMO)

## 2017-01-23 ENCOUNTER — Encounter (HOSPITAL_COMMUNITY): Payer: Self-pay | Admitting: Emergency Medicine

## 2017-01-23 DIAGNOSIS — R4182 Altered mental status, unspecified: Secondary | ICD-10-CM | POA: Diagnosis present

## 2017-01-23 DIAGNOSIS — Y908 Blood alcohol level of 240 mg/100 ml or more: Secondary | ICD-10-CM | POA: Diagnosis not present

## 2017-01-23 DIAGNOSIS — W109XXA Fall (on) (from) unspecified stairs and steps, initial encounter: Secondary | ICD-10-CM | POA: Diagnosis not present

## 2017-01-23 DIAGNOSIS — F1010 Alcohol abuse, uncomplicated: Secondary | ICD-10-CM

## 2017-01-23 DIAGNOSIS — F10129 Alcohol abuse with intoxication, unspecified: Secondary | ICD-10-CM | POA: Insufficient documentation

## 2017-01-23 LAB — COMPREHENSIVE METABOLIC PANEL
ALK PHOS: 47 U/L (ref 38–126)
ALT: 105 U/L — AB (ref 17–63)
ANION GAP: 15 (ref 5–15)
AST: 69 U/L — ABNORMAL HIGH (ref 15–41)
Albumin: 4.5 g/dL (ref 3.5–5.0)
BUN: 6 mg/dL (ref 6–20)
CALCIUM: 9 mg/dL (ref 8.9–10.3)
CHLORIDE: 107 mmol/L (ref 101–111)
CO2: 24 mmol/L (ref 22–32)
CREATININE: 0.89 mg/dL (ref 0.61–1.24)
Glucose, Bld: 91 mg/dL (ref 65–99)
Potassium: 4.1 mmol/L (ref 3.5–5.1)
SODIUM: 146 mmol/L — AB (ref 135–145)
Total Bilirubin: 0.4 mg/dL (ref 0.3–1.2)
Total Protein: 7.8 g/dL (ref 6.5–8.1)

## 2017-01-23 LAB — CBC WITH DIFFERENTIAL/PLATELET
Basophils Absolute: 0 10*3/uL (ref 0.0–0.1)
Basophils Relative: 0 %
EOS ABS: 0 10*3/uL (ref 0.0–0.7)
EOS PCT: 0 %
HCT: 37.7 % — ABNORMAL LOW (ref 39.0–52.0)
Hemoglobin: 12.8 g/dL — ABNORMAL LOW (ref 13.0–17.0)
LYMPHS ABS: 2 10*3/uL (ref 0.7–4.0)
LYMPHS PCT: 19 %
MCH: 32.9 pg (ref 26.0–34.0)
MCHC: 34 g/dL (ref 30.0–36.0)
MCV: 96.9 fL (ref 78.0–100.0)
MONO ABS: 0.3 10*3/uL (ref 0.1–1.0)
MONOS PCT: 2 %
Neutro Abs: 8.7 10*3/uL — ABNORMAL HIGH (ref 1.7–7.7)
Neutrophils Relative %: 79 %
PLATELETS: 280 10*3/uL (ref 150–400)
RBC: 3.89 MIL/uL — ABNORMAL LOW (ref 4.22–5.81)
RDW: 13 % (ref 11.5–15.5)
WBC: 11 10*3/uL — ABNORMAL HIGH (ref 4.0–10.5)

## 2017-01-23 LAB — RAPID URINE DRUG SCREEN, HOSP PERFORMED
Amphetamines: NOT DETECTED
Barbiturates: NOT DETECTED
Benzodiazepines: NOT DETECTED
COCAINE: NOT DETECTED
OPIATES: NOT DETECTED
TETRAHYDROCANNABINOL: NOT DETECTED

## 2017-01-23 LAB — CBG MONITORING, ED: Glucose-Capillary: 82 mg/dL (ref 65–99)

## 2017-01-23 LAB — ETHANOL: ALCOHOL ETHYL (B): 477 mg/dL — AB (ref <5)

## 2017-01-23 MED ORDER — SODIUM CHLORIDE 0.9 % IV BOLUS (SEPSIS)
1000.0000 mL | Freq: Once | INTRAVENOUS | Status: AC
  Administered 2017-01-23: 1000 mL via INTRAVENOUS

## 2017-01-23 NOTE — ED Notes (Signed)
Patient transported to CT 

## 2017-01-23 NOTE — ED Notes (Signed)
Pt's c-collar removed, per Dr. Adriana Simas.

## 2017-01-23 NOTE — ED Notes (Signed)
Pt reports drinking a pint of vodka and falling down some stairs. Pt unsure of LOC. Pt denies any pain.

## 2017-01-23 NOTE — ED Provider Notes (Signed)
[facility]-EMERGENCY DEPT Provider Note   CSN: [id number] Arrival date & time: 01/23/17  1819     History   Chief Complaint Chief Complaint  Patient presents with  . Altered Mental Status    HPI Brett Molina is a 26 y.o. male.  Level V caveat for urgent need for intervention. Patient allegedly drank an excessive amount of alcohol earlier today and then fell down several stairs. He was found by his roommates at the bottom of the stairs. He has a history of alcohol abuse. He was initially unresponsive, but eventually did start answering questions.      Past Medical History:  Diagnosis Date  . ETOH abuse     Patient Active Problem List   Diagnosis Date Noted  . Alcoholic intoxication without complication (HCC) 12/12/2016  . Hypokalemia 12/12/2016  . Alcoholic hepatitis without ascites 12/12/2016  . MDD (major depressive disorder) 12/12/2016    History reviewed. No pertinent surgical history.     Home Medications    Prior to Admission medications   Medication Sig Start Date End Date Taking? Authorizing Provider  amLODipine (NORVASC) 10 MG tablet Take 1 tablet (10 mg total) by mouth daily. 12/27/16  Yes Glade Lloyd, MD  gabapentin (NEURONTIN) 300 MG capsule Take 300 mg by mouth 3 (three) times daily.  01/11/17  Yes [provider]  hydrALAZINE (APRESOLINE) 50 MG tablet Take 1 tablet (50 mg total) by mouth every 8 (eight) hours. 12/27/16  Yes Glade Lloyd, MD  labetalol (NORMODYNE) 300 MG tablet Take 1 tablet (300 mg total) by mouth 2 (two) times daily. 12/27/16  Yes Glade Lloyd, MD  OLANZapine (ZYPREXA) 5 MG tablet Take 5 mg by mouth at bedtime.  01/11/17  Yes [provider]  folic acid (FOLVITE) 1 MG tablet Take 1 tablet (1 mg total) by mouth daily. 12/27/16   Glade Lloyd, MD  gabapentin (NEURONTIN) 100 MG capsule Take 1 capsule (100 mg total) by mouth at bedtime. 12/27/16   Glade Lloyd, MD  Multiple Vitamin (MULTIVITAMIN WITH MINERALS) TABS tablet  Take 1 tablet by mouth daily. 12/27/16   Glade Lloyd, MD  QUEtiapine (SEROQUEL) 50 MG tablet Take 1 tablet (50 mg total) by mouth 3 (three) times daily. 12/27/16   Glade Lloyd, MD  thiamine 100 MG tablet Take 1 tablet (100 mg total) by mouth daily. 12/27/16   Glade Lloyd, MD    Family History No family history on file.  Social History Social History  Substance Use Topics  . Smoking status: Never Smoker  . Smokeless tobacco: Never Used  . Alcohol use 8.4 oz/week    14 Standard drinks or equivalent per week     Comment: Patient drank vodka yesterday     Allergies   Patient has no known allergies.   Review of Systems Review of Systems  Reason unable to perform ROS: Urgent need for intervention.     Physical Exam Updated Vital Signs BP (!) 153/91   Pulse (!) 101   Temp 98.7 F (37.1 C) (Oral)   Resp 18   Ht 6\' 1"  (1.854 m)   Wt 95.3 kg (210 lb)   SpO2 98%   BMI 27.71 kg/m   Physical Exam  Constitutional:  Patient is easily arousable,  but confused  HENT:  Head: Normocephalic and atraumatic.  Eyes: Conjunctivae are normal.  Neck: Neck supple.  Cardiovascular: Normal rate and regular rhythm.   Pulmonary/Chest: Effort normal and breath sounds normal.  Abdominal: Soft. Bowel sounds are normal.  Musculoskeletal:  Normal range of motion.  Neurological:  groggy  Skin: Skin is warm and dry.  Psychiatric: He has a normal mood and affect. His behavior is normal.  Nursing note and vitals reviewed.    ED Treatments / Results  Labs (all labs ordered are listed, but only abnormal results are displayed) Labs Reviewed  CBC WITH DIFFERENTIAL/PLATELET - Abnormal; Notable for the following:       Result Value   WBC 11.0 (*)    RBC 3.89 (*)    Hemoglobin 12.8 (*)    HCT 37.7 (*)    Neutro Abs 8.7 (*)    All other components within normal limits  COMPREHENSIVE METABOLIC PANEL - Abnormal; Notable for the following:    Sodium 146 (*)    AST 69 (*)    ALT 105 (*)     All other components within normal limits  ETHANOL - Abnormal; Notable for the following:    Alcohol, Ethyl (B) 477 (*)    All other components within normal limits  RAPID URINE DRUG SCREEN, HOSP PERFORMED  CBG MONITORING, ED    EKG  EKG Interpretation None       Radiology Ct Cervical Spine Wo Contrast  Result Date: 01/23/2017 CLINICAL DATA:  Fall down stairs with neck pain.  Initial encounter. EXAM: CT CERVICAL SPINE WITHOUT CONTRAST TECHNIQUE: Multidetector CT imaging of the cervical spine was performed without intravenous contrast. Multiplanar CT image reconstructions were also generated. COMPARISON:  None. FINDINGS: Alignment: Normal. Skull base and vertebrae: No acute fracture. No primary bone lesion or focal pathologic process. Soft tissues and spinal canal: No prevertebral fluid or swelling. No visible canal hematoma. Disc levels:  No degenerative changes or evidence of impingement. Upper chest: Negative IMPRESSION: No evidence of cervical spine injury. Electronically Signed   By: Marnee Spring M.D.   On: 01/23/2017 20:16    Procedures Procedures (including critical care time)  Medications Ordered in ED Medications  sodium chloride 0.9 % bolus 1,000 mL (0 mLs Intravenous Stopped 01/23/17 2250)     Initial Impression / Assessment and Plan / ED Course  I have reviewed the triage vital signs and the nursing notes.  Pertinent labs & imaging results that were available during my care of the patient were reviewed by me and considered in my medical decision making (see chart for details).     Patient drank an excessive amount of alcohol and fell down a few steps. His initial ethanol level was 477. CT of cervical spine and head negative. He was observed for multiple hours and ultimately discharged  Final Clinical Impressions(s) / ED Diagnoses   Final diagnoses:  Alcohol abuse    New Prescriptions New Prescriptions   No medications on file     Donnetta Hutching,  MD 01/27/17 1625

## 2017-01-23 NOTE — ED Notes (Signed)
ED Provider at bedside. 

## 2017-01-23 NOTE — ED Notes (Signed)
IV access attempted X2 unsuccessful 

## 2017-01-23 NOTE — ED Triage Notes (Signed)
Patient arrived to ED via [facility] from home. EMS reports:  Roommates called EMS after finding patient at bottom of stairs. Unwitnessed as to whether patient fell. Patient unresponsive to roommates. When EMS arrived, patient responsive to verbal stimuli. Responsive to name. Answered some questions, but not others. Disoriented except to name.  Patient did report to EMS that he had drank ETOH today & had taken "something".  Pupils 39mm & unresponsive - per EMS.  Hx ETOH abuse.  C-collar in place. BP 138/90, Pulse 95, Resp 22, 99% on room air. Lung sounds clear.

## 2017-01-24 NOTE — ED Provider Notes (Signed)
Care assumed from Dr. Adriana Simas at shift change. Patient presenting here with altered level of consciousness. It turns out he was heavily intoxicated with a blood alcohol of 477. He was observed for multiple hours in the ED and is now wide awake, alert, and tolerating liquids. He is requesting to go home.  It appears as though his alcoholism is chronic and I suspect he drinks but a daily basis. He tells me he will go home and follow-up as needed.   Brett Lyons, MD 01/24/17 [phone]

## 2017-02-16 ENCOUNTER — Encounter (HOSPITAL_COMMUNITY): Payer: Self-pay

## 2017-02-16 ENCOUNTER — Inpatient Hospital Stay (HOSPITAL_COMMUNITY)
Admission: EM | Admit: 2017-02-16 | Discharge: 2017-02-18 | DRG: 897 | Disposition: A | Discharge status: Home / Self Care | Payer: 59 | Attending: Family Medicine | Admitting: Family Medicine

## 2017-02-16 DIAGNOSIS — F10930 Alcohol use, unspecified with withdrawal, uncomplicated: Secondary | ICD-10-CM

## 2017-02-16 DIAGNOSIS — F319 Bipolar disorder, unspecified: Secondary | ICD-10-CM | POA: Diagnosis present

## 2017-02-16 DIAGNOSIS — F10939 Alcohol use, unspecified with withdrawal, unspecified: Secondary | ICD-10-CM | POA: Diagnosis present

## 2017-02-16 DIAGNOSIS — F10230 Alcohol dependence with withdrawal, uncomplicated: Principal | ICD-10-CM | POA: Diagnosis present

## 2017-02-16 DIAGNOSIS — F10239 Alcohol dependence with withdrawal, unspecified: Secondary | ICD-10-CM | POA: Diagnosis present

## 2017-02-16 DIAGNOSIS — I10 Essential (primary) hypertension: Secondary | ICD-10-CM

## 2017-02-16 DIAGNOSIS — F1010 Alcohol abuse, uncomplicated: Secondary | ICD-10-CM

## 2017-02-16 DIAGNOSIS — K7010 Alcoholic hepatitis without ascites: Secondary | ICD-10-CM | POA: Diagnosis present

## 2017-02-16 LAB — COMPREHENSIVE METABOLIC PANEL
ALT: 87 U/L — AB (ref 17–63)
AST: 51 U/L — AB (ref 15–41)
Albumin: 4.8 g/dL (ref 3.5–5.0)
Alkaline Phosphatase: 60 U/L (ref 38–126)
Anion gap: 16 — ABNORMAL HIGH (ref 5–15)
BILIRUBIN TOTAL: 0.9 mg/dL (ref 0.3–1.2)
CHLORIDE: 97 mmol/L — AB (ref 101–111)
CO2: 26 mmol/L (ref 22–32)
CREATININE: 0.86 mg/dL (ref 0.61–1.24)
Calcium: 9.8 mg/dL (ref 8.9–10.3)
GFR calc Af Amer: >60 mL/min (ref >60)
Glucose, Bld: 122 mg/dL — ABNORMAL HIGH (ref 65–99)
Potassium: 3.6 mmol/L (ref 3.5–5.1)
SODIUM: 139 mmol/L (ref 135–145)
TOTAL PROTEIN: 7.9 g/dL (ref 6.5–8.1)

## 2017-02-16 LAB — RAPID URINE DRUG SCREEN, HOSP PERFORMED
Amphetamines: NOT DETECTED
BENZODIAZEPINES: NOT DETECTED
Barbiturates: NOT DETECTED
Cocaine: NOT DETECTED
Opiates: NOT DETECTED
Tetrahydrocannabinol: NOT DETECTED

## 2017-02-16 LAB — CBC WITH DIFFERENTIAL/PLATELET
Basophils Absolute: 0 10*3/uL (ref 0.0–0.1)
Basophils Relative: 0 %
EOS PCT: 0 %
Eosinophils Absolute: 0 10*3/uL (ref 0.0–0.7)
HCT: 35.9 % — ABNORMAL LOW (ref 39.0–52.0)
Hemoglobin: 12.3 g/dL — ABNORMAL LOW (ref 13.0–17.0)
LYMPHS PCT: 8 %
Lymphs Abs: 0.8 10*3/uL (ref 0.7–4.0)
MCH: 32.9 pg (ref 26.0–34.0)
MCHC: 34.3 g/dL (ref 30.0–36.0)
MCV: 96 fL (ref 78.0–100.0)
MONO ABS: 0.5 10*3/uL (ref 0.1–1.0)
Monocytes Relative: 5 %
Neutro Abs: 8.8 10*3/uL — ABNORMAL HIGH (ref 1.7–7.7)
Neutrophils Relative %: 87 %
PLATELETS: 360 10*3/uL (ref 150–400)
RBC: 3.74 MIL/uL — ABNORMAL LOW (ref 4.22–5.81)
RDW: 13.6 % (ref 11.5–15.5)
WBC: 10.1 10*3/uL (ref 4.0–10.5)

## 2017-02-16 LAB — MRSA PCR SCREENING: MRSA by PCR: NEGATIVE

## 2017-02-16 LAB — ETHANOL

## 2017-02-16 MED ORDER — OLANZAPINE 5 MG PO TABS
5.0000 mg | ORAL_TABLET | Freq: Every day | ORAL | Status: DC
  Administered 2017-02-16 – 2017-02-17 (×2): 5 mg via ORAL
  Filled 2017-02-16 (×2): qty 1

## 2017-02-16 MED ORDER — THIAMINE HCL 100 MG/ML IJ SOLN: 100.0000 mg | Freq: Every day | INTRAMUSCULAR | Status: DC

## 2017-02-16 MED ORDER — ZOLPIDEM TARTRATE 5 MG PO TABS: 5.0000 mg | ORAL_TABLET | Freq: Every evening | ORAL | Status: DC | PRN

## 2017-02-16 MED ORDER — METOPROLOL TARTRATE 5 MG/5ML IV SOLN: 2.5000 mg | INTRAVENOUS | Status: DC | PRN

## 2017-02-16 MED ORDER — HYDRALAZINE HCL 50 MG PO TABS
50.0000 mg | ORAL_TABLET | Freq: Three times a day (TID) | ORAL | Status: DC
  Administered 2017-02-16 – 2017-02-18 (×6): 50 mg via ORAL
  Filled 2017-02-16 (×6): qty 1

## 2017-02-16 MED ORDER — LORAZEPAM 1 MG PO TABS
0.0000 mg | ORAL_TABLET | Freq: Four times a day (QID) | ORAL | Status: AC
  Administered 2017-02-17: 1 mg via ORAL
  Filled 2017-02-16: qty 1
  Filled 2017-02-16 (×2): qty 2

## 2017-02-16 MED ORDER — ENOXAPARIN SODIUM 40 MG/0.4ML ~~LOC~~ SOLN
40.0000 mg | SUBCUTANEOUS | Status: DC
  Administered 2017-02-16 – 2017-02-17 (×2): 40 mg via SUBCUTANEOUS
  Filled 2017-02-16 (×2): qty 0.4

## 2017-02-16 MED ORDER — THIAMINE HCL 100 MG PO TABS: 100.0000 mg | ORAL_TABLET | Freq: Every day | ORAL | Status: DC

## 2017-02-16 MED ORDER — LORAZEPAM 1 MG PO TABS
0.0000 mg | ORAL_TABLET | Freq: Two times a day (BID) | ORAL | Status: DC
  Administered 2017-02-16: 2 mg via ORAL

## 2017-02-16 MED ORDER — ACETAMINOPHEN 325 MG PO TABS: 650.0000 mg | ORAL_TABLET | Freq: Four times a day (QID) | ORAL | Status: DC | PRN

## 2017-02-16 MED ORDER — METOPROLOL TARTRATE 5 MG/5ML IV SOLN
2.5000 mg | Freq: Once | INTRAVENOUS | Status: AC
  Administered 2017-02-16: 2.5 mg via INTRAVENOUS
  Filled 2017-02-16: qty 5

## 2017-02-16 MED ORDER — VITAMIN B-1 100 MG PO TABS
100.0000 mg | ORAL_TABLET | Freq: Every day | ORAL | Status: DC
  Administered 2017-02-16 – 2017-02-18 (×3): 100 mg via ORAL
  Filled 2017-02-16 (×4): qty 1

## 2017-02-16 MED ORDER — SODIUM CHLORIDE 0.9 % IV BOLUS (SEPSIS)
1000.0000 mL | Freq: Once | INTRAVENOUS | Status: AC
  Administered 2017-02-16: 1000 mL via INTRAVENOUS

## 2017-02-16 MED ORDER — ADULT MULTIVITAMIN W/MINERALS CH
1.0000 | ORAL_TABLET | Freq: Every day | ORAL | Status: DC
  Administered 2017-02-16 – 2017-02-18 (×3): 1 via ORAL
  Filled 2017-02-16 (×3): qty 1

## 2017-02-16 MED ORDER — SODIUM CHLORIDE 0.9 % IV SOLN
250.0000 mL | INTRAVENOUS | Status: DC | PRN
Start: 2017-02-16 — End: 2017-02-18

## 2017-02-16 MED ORDER — LORAZEPAM 2 MG/ML IJ SOLN: 0.0000 mg | Freq: Two times a day (BID) | INTRAMUSCULAR | Status: DC

## 2017-02-16 MED ORDER — SODIUM CHLORIDE 0.9% FLUSH
3.0000 mL | Freq: Two times a day (BID) | INTRAVENOUS | Status: DC
  Administered 2017-02-16 – 2017-02-18 (×4): 3 mL via INTRAVENOUS

## 2017-02-16 MED ORDER — QUETIAPINE FUMARATE 50 MG PO TABS
50.0000 mg | ORAL_TABLET | Freq: Three times a day (TID) | ORAL | Status: DC
  Administered 2017-02-16 – 2017-02-18 (×6): 50 mg via ORAL
  Filled 2017-02-16 (×6): qty 1

## 2017-02-16 MED ORDER — ACETAMINOPHEN 650 MG RE SUPP: 650.0000 mg | Freq: Four times a day (QID) | RECTAL | Status: DC | PRN

## 2017-02-16 MED ORDER — ONDANSETRON HCL 4 MG PO TABS: 4.0000 mg | ORAL_TABLET | Freq: Four times a day (QID) | ORAL | Status: DC | PRN

## 2017-02-16 MED ORDER — LORAZEPAM 2 MG/ML IJ SOLN
0.0000 mg | Freq: Four times a day (QID) | INTRAMUSCULAR | Status: AC
  Administered 2017-02-16: 2 mg via INTRAVENOUS
  Filled 2017-02-16: qty 1

## 2017-02-16 MED ORDER — AMLODIPINE BESYLATE 10 MG PO TABS
10.0000 mg | ORAL_TABLET | Freq: Every day | ORAL | Status: DC
  Administered 2017-02-16 – 2017-02-18 (×3): 10 mg via ORAL
  Filled 2017-02-16 (×3): qty 1

## 2017-02-16 MED ORDER — LORAZEPAM 2 MG/ML IJ SOLN
2.0000 mg | Freq: Once | INTRAMUSCULAR | Status: AC
  Administered 2017-02-16: 2 mg via INTRAVENOUS
  Filled 2017-02-16: qty 1

## 2017-02-16 MED ORDER — LABETALOL HCL 200 MG PO TABS
300.0000 mg | ORAL_TABLET | Freq: Two times a day (BID) | ORAL | Status: DC
  Administered 2017-02-16 – 2017-02-18 (×4): 300 mg via ORAL
  Filled 2017-02-16 (×4): qty 1

## 2017-02-16 MED ORDER — GABAPENTIN 300 MG PO CAPS
300.0000 mg | ORAL_CAPSULE | Freq: Three times a day (TID) | ORAL | Status: DC
  Administered 2017-02-16 – 2017-02-18 (×6): 300 mg via ORAL
  Filled 2017-02-16 (×6): qty 1

## 2017-02-16 MED ORDER — ONDANSETRON HCL 4 MG/2ML IJ SOLN: 4.0000 mg | Freq: Four times a day (QID) | INTRAMUSCULAR | Status: DC | PRN

## 2017-02-16 MED ORDER — HYDROCODONE-ACETAMINOPHEN 5-325 MG PO TABS: 1.0000 | ORAL_TABLET | ORAL | Status: DC | PRN

## 2017-02-16 MED ORDER — FOLIC ACID 1 MG PO TABS
1.0000 mg | ORAL_TABLET | Freq: Every day | ORAL | Status: DC
  Administered 2017-02-16 – 2017-02-18 (×3): 1 mg via ORAL
  Filled 2017-02-16 (×3): qty 1

## 2017-02-16 MED ORDER — SODIUM CHLORIDE 0.9% FLUSH
3.0000 mL | INTRAVENOUS | Status: DC | PRN
Start: 2017-02-16 — End: 2017-02-18

## 2017-02-16 MED ORDER — GABAPENTIN 100 MG PO CAPS
100.0000 mg | ORAL_CAPSULE | Freq: Every day | ORAL | Status: DC
  Administered 2017-02-16 – 2017-02-17 (×2): 100 mg via ORAL
  Filled 2017-02-16 (×2): qty 1

## 2017-02-16 NOTE — Progress Notes (Signed)
   02/16/17 1700  Vitals  BP (!) 183/97  MAP (mmHg) 123  ECG Heart Rate (!) 104   Dr. Sharyon Medicus notified of BP and HR. Ordered to give Lopressor 2.5 mg now and q4hrs for a systolic BP greater than 170. Audie Box Slusher

## 2017-02-16 NOTE — ED Triage Notes (Signed)
Pt states that he is a pint a day drinker x 5 years.  Pt states that he last had a drink yesterday around noon.  Started to detox last night.  C/o shaking, sweating, nausea.  Denies AV hallucinations, SI/HI.

## 2017-02-16 NOTE — ED Notes (Signed)
Attempted to call report.  Nurse unavailable at this time but will call back.

## 2017-02-16 NOTE — H&P (Signed)
[facility]                                                                                    Patient Demographics  Brett Molina, is a 26 y.o. male  CSN: [id number]  MRN: [id number]  DOB - 01/01/1991  Admit Date - 02/16/2017  Outpatient Primary MD for the patient is Patient, No Pcp Per   With History of -  Past Medical History:  Diagnosis Date  . ETOH abuse       No past surgical history on file.  in for   Chief Complaint  Patient presents with  . Detox     HPI  Brett Molina  is a 26 y.o. male, With past medical history significant for alcoholism, around 1 pint a day for the last 5 years., History of alcoholic hepatitis, and previous withdrawal episodes admitted to the intensive care unit, sent from [facility] house because of food withdrawal. Patient received already Ativan in the emergency room for increasing shakes.    Review of Systems    In addition to the HPI above,  No Fever-chills, No Headache, No changes with Vision or hearing, No problems swallowing food or Liquids, No Chest pain, Cough or Shortness of Breath, No Abdominal pain, No Nausea or Vommitting, Bowel movements are regular, No Blood in stool or Urine, No dysuria, No new skin rashes or bruises, No new joints pains-aches,  No new weakness, tingling, numbness in any extremity, No recent weight gain or loss, No polyuria, polydypsia or polyphagia,   A full 10 point Review of Systems was done, except as stated above, all other Review of Systems were negative.   Social History Social History  Substance Use Topics  . Smoking status: Never Smoker  . Smokeless tobacco: Never Used  . Alcohol use 8.4 oz/week    14 Standard drinks or equivalent per week     Comment: Patient drank vodka yesterday     Family History No family history on file.   Prior to Admission medications   Medication Sig Start Date End Date Taking? Authorizing Provider  amLODipine (NORVASC) 10 MG tablet  Take 1 tablet (10 mg total) by mouth daily. 12/27/16   Glade Lloyd, MD  folic acid (FOLVITE) 1 MG tablet Take 1 tablet (1 mg total) by mouth daily. 12/27/16   Glade Lloyd, MD  gabapentin (NEURONTIN) 100 MG capsule Take 1 capsule (100 mg total) by mouth at bedtime. 12/27/16   Glade Lloyd, MD  gabapentin (NEURONTIN) 300 MG capsule Take 300 mg by mouth 3 (three) times daily.  01/11/17   [provider]  hydrALAZINE (APRESOLINE) 50 MG tablet Take 1 tablet (50 mg total) by mouth every 8 (eight) hours. 12/27/16   Glade Lloyd, MD  labetalol (NORMODYNE) 300 MG tablet Take 1 tablet (300 mg total) by mouth 2 (two) times daily. 12/27/16   Glade Lloyd, MD  Multiple Vitamin (MULTIVITAMIN WITH MINERALS) TABS tablet Take 1 tablet by mouth daily. 12/27/16   Glade Lloyd, MD  OLANZapine (ZYPREXA) 5 MG tablet Take 5 mg by mouth at bedtime.  01/11/17   [provider]  QUEtiapine (SEROQUEL) 50 MG tablet Take 1  tablet (50 mg total) by mouth 3 (three) times daily. 12/27/16   Glade Lloyd, MD  thiamine 100 MG tablet Take 1 tablet (100 mg total) by mouth daily. 12/27/16   Glade Lloyd, MD    No Known Allergies  Physical Exam  Vitals  Blood pressure (!) 144/105, pulse (!) 102, temperature 98.8 F (37.1 C), temperature source Oral, resp. rate 20, height 6\' 2"  (1.88 m), weight 93 kg (205 lb), SpO2 100 %.   1. General A very pleasant young male, well-developed, well-nourished. Anxious  2. Normal affect and insight, Not Suicidal or Homicidal, Awake Alert, Oriented X 3.  3. No F.N deficits, patient moving all extremities   4. Ears and Eyes appear Normal, Conjunctivae clear, PERRLA. Moist Oral Mucosa.  5. Supple Neck, No JVD, No cervical lymphadenopathy appriciated, No Carotid Bruits.  6. Symmetrical Chest wall movement, Good air movement bilaterally, CTAB.  7. RRR, No Gallops, Rubs or Murmurs, No Parasternal Heave.  8. Positive Bowel Sounds, Abdomen Soft, Non tender, No organomegaly  appriciated,No rebound -guarding or rigidity.  9.  No Cyanosis, Normal Skin Turgor, No Skin Rash or Bruise.  10. Good muscle tone,  joints appear normal , no effusions, Normal ROM.   Data Review  CBC  Recent Labs Lab 02/16/17 0851  WBC 10.1  HGB 12.3*  HCT 35.9*  PLT 360  MCV 96.0  MCH 32.9  MCHC 34.3  RDW 13.6  LYMPHSABS 0.8  MONOABS 0.5  EOSABS 0.0  BASOSABS 0.0   ------------------------------------------------------------------------------------------------------------------  Chemistries   Recent Labs Lab 02/16/17 0851  NA 139  K 3.6  CL 97*  CO2 26  GLUCOSE 122*  BUN <5*  CREATININE 0.86  CALCIUM 9.8  AST 51*  ALT 87*  ALKPHOS 60  BILITOT 0.9   ------------------------------------------------------------------------------------------------------------------ estimated creatinine clearance is 148.7 mL/min (by C-G formula based on SCr of 0.86 mg/dL). ------------------------------------------------------------------------------------------------------------------ No results for input(s): TSH, T4TOTAL, T3FREE, THYROIDAB in the last 72 hours.  Invalid input(s): FREET3   Coagulation profile No results for input(s): INR, PROTIME in the last 168 hours. ------------------------------------------------------------------------------------------------------------------- No results for input(s): DDIMER in the last 72 hours. -------------------------------------------------------------------------------------------------------------------  Cardiac Enzymes No results for input(s): CKMB, TROPONINI, MYOGLOBIN in the last 168 hours.  Invalid input(s): CK ------------------------------------------------------------------------------------------------------------------ Invalid input(s): POCBNP   ---------------------------------------------------------------------------------------------------------------  Urinalysis    Component Value Date/Time   COLORURINE  STRAW (A) 01/12/2017 1654   APPEARANCEUR CLEAR 01/12/2017 1654   LABSPEC 1.005 01/12/2017 1654   PHURINE 9.0 (H) 01/12/2017 1654   GLUCOSEU NEGATIVE 01/12/2017 1654   HGBUR NEGATIVE 01/12/2017 1654   BILIRUBINUR NEGATIVE 01/12/2017 1654   KETONESUR NEGATIVE 01/12/2017 1654   PROTEINUR 30 (A) 01/12/2017 1654   NITRITE NEGATIVE 01/12/2017 1654   LEUKOCYTESUR NEGATIVE 01/12/2017 1654    ----------------------------------------------------------------------------------------------------------------     Imaging results:   Ct Head Wo Contrast  Result Date: 01/24/2017 CLINICAL DATA:  Fall down stairs EXAM: CT HEAD WITHOUT CONTRAST TECHNIQUE: Contiguous axial images were obtained from the base of the skull through the vertex without intravenous contrast. COMPARISON:  None. FINDINGS: Brain: No mass lesion, intraparenchymal hemorrhage or extra-axial collection. No evidence of acute cortical infarct. Brain parenchyma and CSF-containing spaces are normal for age. Vascular: No hyperdense vessel or unexpected calcification. Skull: Normal visualized skull base, calvarium and extracranial soft tissues. Sinuses/Orbits: No sinus fluid levels or advanced mucosal thickening. No mastoid effusion. Normal orbits. IMPRESSION: Normal head CT. Electronically Signed   By: Deatra Robinson M.D.   On: 01/24/2017 00:03   Ct  Cervical Spine Wo Contrast  Result Date: 01/23/2017 CLINICAL DATA:  Fall down stairs with neck pain.  Initial encounter. EXAM: CT CERVICAL SPINE WITHOUT CONTRAST TECHNIQUE: Multidetector CT imaging of the cervical spine was performed without intravenous contrast. Multiplanar CT image reconstructions were also generated. COMPARISON:  None. FINDINGS: Alignment: Normal. Skull base and vertebrae: No acute fracture. No primary bone lesion or focal pathologic process. Soft tissues and spinal canal: No prevertebral fluid or swelling. No visible canal hematoma. Disc levels:  No degenerative changes or evidence  of impingement. Upper chest: Negative IMPRESSION: No evidence of cervical spine injury. Electronically Signed   By: Marnee Spring M.D.   On: 01/23/2017 20:16      Assessment & Plan  1. Alcohol withdrawal with history of alcoholic hepatitis and previous withdrawals 2. Bipolar disorder 3. Hypertension  Plan Admit to step down for withdrawal protocol Continue with home medications   DVT Prophylaxis Lovenox  AM Labs Ordered, also please review Full Orders    Code Status full  Disposition Plan: Unable to determine  Time spent in minutes : 32 minutes  Condition GUARDED   @SIGNATURE @

## 2017-02-16 NOTE — ED Provider Notes (Signed)
[facility]-EMERGENCY DEPT Provider Note   CSN: [id number] Arrival date & time: 02/16/17  0709     History   Chief Complaint Chief Complaint  Patient presents with  . Detox    HPI Brett Molina is a 26 y.o. male.  HPI Patient presents with alcohol withdrawal. States he drinks around a pint a day. Has been drinking for the last 5 years. Yesterday was his last drink. Went to a [facility] for alcohol treatment. Now withdrawal is gotten worse. Reportedly sent in. Patient states he has just been drinking heavily for the last week but otherwise will drink but not every day but frequently. Patient states he's never had any liver problems because of the drinking. But reviewing the chart shows that he has had alcoholic hepatitis. Initially denied having severe withdrawa in the past but reviewed records in 2 months ago had to go to the ICU due to his withdrawal. Likely is more heavy drinker since alcohol levels of a near 500 and he has been shaky. States he is having difficulty walking.. Past Medical History:  Diagnosis Date  . ETOH abuse     Patient Active Problem List   Diagnosis Date Noted  . Alcoholic intoxication without complication (HCC) 12/12/2016  . Hypokalemia 12/12/2016  . Alcoholic hepatitis without ascites 12/12/2016  . MDD (major depressive disorder) 12/12/2016    No past surgical history on file.     Home Medications    Prior to Admission medications   Medication Sig Start Date End Date Taking? Authorizing Provider  amLODipine (NORVASC) 10 MG tablet Take 1 tablet (10 mg total) by mouth daily. 12/27/16   Glade Lloyd, MD  folic acid (FOLVITE) 1 MG tablet Take 1 tablet (1 mg total) by mouth daily. 12/27/16   Glade Lloyd, MD  gabapentin (NEURONTIN) 100 MG capsule Take 1 capsule (100 mg total) by mouth at bedtime. 12/27/16   Glade Lloyd, MD  gabapentin (NEURONTIN) 300 MG capsule Take 300 mg by mouth 3 (three) times daily.  01/11/17   [provider]  hydrALAZINE  (APRESOLINE) 50 MG tablet Take 1 tablet (50 mg total) by mouth every 8 (eight) hours. 12/27/16   Glade Lloyd, MD  labetalol (NORMODYNE) 300 MG tablet Take 1 tablet (300 mg total) by mouth 2 (two) times daily. 12/27/16   Glade Lloyd, MD  Multiple Vitamin (MULTIVITAMIN WITH MINERALS) TABS tablet Take 1 tablet by mouth daily. 12/27/16   Glade Lloyd, MD  OLANZapine (ZYPREXA) 5 MG tablet Take 5 mg by mouth at bedtime.  01/11/17   [provider]  QUEtiapine (SEROQUEL) 50 MG tablet Take 1 tablet (50 mg total) by mouth 3 (three) times daily. 12/27/16   Glade Lloyd, MD  thiamine 100 MG tablet Take 1 tablet (100 mg total) by mouth daily. 12/27/16   Glade Lloyd, MD    Family History No family history on file.  Social History Social History  Substance Use Topics  . Smoking status: Never Smoker  . Smokeless tobacco: Never Used  . Alcohol use 8.4 oz/week    14 Standard drinks or equivalent per week     Comment: Patient drank vodka yesterday     Allergies   Patient has no known allergies.   Review of Systems Review of Systems  Constitutional: Positive for appetite change.  HENT: Negative for congestion.   Respiratory: Negative for shortness of breath.   Cardiovascular: Negative for chest pain.  Gastrointestinal: Positive for nausea.  Genitourinary: Negative for flank pain.  Musculoskeletal: Negative for  back pain.  Neurological: Positive for tremors.  Hematological: Negative for adenopathy.  Psychiatric/Behavioral: Negative for confusion.     Physical Exam Updated Vital Signs BP (!) 144/105   Pulse (!) 102   Temp 98.8 F (37.1 C) (Oral)   Resp 20   Ht 6\' 2"  (1.88 m)   Wt 93 kg (205 lb)   SpO2 100%   BMI 26.32 kg/m   Physical Exam  Constitutional: He appears well-developed.  HENT:  Head: Atraumatic.  Eyes: EOM are normal.  Neck: Neck supple.  Cardiovascular: Normal rate.   Pulmonary/Chest: Effort normal.  Abdominal: Soft. There is no tenderness.    Musculoskeletal: He exhibits no edema.  Neurological: He is alert.  Patient is tremulous. Unable to take the pills himself. He is awake and appropriate and able to answer questions. Denies hallucinations.  Skin: Skin is warm. Capillary refill takes less than 2 seconds.     ED Treatments / Results  Labs (all labs ordered are listed, but only abnormal results are displayed) Labs Reviewed  COMPREHENSIVE METABOLIC PANEL - Abnormal; Notable for the following:       Result Value   Chloride 97 (*)    Glucose, Bld 122 (*)    BUN <5 (*)    AST 51 (*)    ALT 87 (*)    Anion gap 16 (*)    All other components within normal limits  CBC WITH DIFFERENTIAL/PLATELET - Abnormal; Notable for the following:    RBC 3.74 (*)    Hemoglobin 12.3 (*)    HCT 35.9 (*)    Neutro Abs 8.8 (*)    All other components within normal limits  ETHANOL  RAPID URINE DRUG SCREEN, HOSP PERFORMED    EKG  EKG Interpretation None       Radiology No results found.  Procedures Procedures (including critical care time)  Medications Ordered in ED Medications  LORazepam (ATIVAN) injection 0-4 mg (not administered)    Or  LORazepam (ATIVAN) tablet 0-4 mg (not administered)  LORazepam (ATIVAN) injection 0-4 mg ( Intravenous See Alternative 02/16/17 0823)    Or  LORazepam (ATIVAN) tablet 0-4 mg (2 mg Oral Given 02/16/17 0823)  thiamine (VITAMIN B-1) tablet 100 mg (100 mg Oral Given 02/16/17 0942)    Or  thiamine (B-1) injection 100 mg ( Intravenous See Alternative 02/16/17 0942)  sodium chloride 0.9 % bolus 1,000 mL (not administered)  LORazepam (ATIVAN) injection 2 mg (not administered)     Initial Impression / Assessment and Plan / ED Course  I have reviewed the triage vital signs and the nursing notes.  Pertinent labs & imaging results that were available during my care of the patient were reviewed by me and considered in my medical decision making (see chart for details).     Patient presents with  severe alcohol withdrawal. Alcohol level is 0. Last drink yesterday. Is however tremulous. No relief with oral Ativan. Has had previous admission to ICU for withdrawal. Will admit to internal medicine. He is not hallucinating at this time. Does have some hypertension but is reasonable to start in stepdown.  Final Clinical Impressions(s) / ED Diagnoses   Final diagnoses:  Alcohol withdrawal syndrome without complication [facility])    New Prescriptions New Prescriptions   No medications on file     Benjiman Core, MD 02/16/17 [phone]

## 2017-02-17 DIAGNOSIS — F10230 Alcohol dependence with withdrawal, uncomplicated: Principal | ICD-10-CM

## 2017-02-17 DIAGNOSIS — F3341 Major depressive disorder, recurrent, in partial remission: Secondary | ICD-10-CM

## 2017-02-17 NOTE — Clinical Social Work Note (Addendum)
Clinical Social Work Assessment  Patient Details  Name: Brett Molina MRN: [id number] Date of Birth: [birth year]  Date of referral:  02/17/17               Reason for consult:  Discharge Planning                Permission sought to share information with:  Facility Sport and exercise psychologist, Family Supports Permission granted to share information::  Yes, Verbal Permission Granted  Name::        Agency::     Relationship::     Contact Information:     Housing/Transportation Living arrangements for the past 2 months:  [place] of Information:  Patient Patient Interpreter Needed:  None Criminal Activity/Legal Involvement Pertinent to Current Situation/Hospitalization:  No - Comment as needed Significant Relationships:  Siblings Lives with:  Siblings, Self Do you feel safe going back to the place where you live?  Yes Need for family participation in patient care:  Yes (Comment)  Care giving concerns:  Pt is independent.  Pt is from [facility], was bought here from the facility to detox. Pt will be at [facility] for a 9 month substance abuse program. Pt will return to facility when medically stable.   Social Worker assessment / plan:  CSW met with pt @ bedside. Pt admitted for detox. Demographics verified. Pt lives @ [place], pt will be there for a 9 month substance abuse program. Prior to going into sub abuse program pt was living with his brother. Pt reports that he has been drinking since age 26 and has not been able to sustain from etoh on his own. Pt appears to be motivated to remain sober and understands the commitment to doing so. The facility will pick up pt when medically stable. MSW will continue to follow for DC planning.  Employment status:  Unemployed Forensic scientist:  Managed Care PT Recommendations:  Not assessed at this time Information / Referral to community resources:  Support Groups, Residential Substance Abuse Treatment  Options  Patient/Family's Response to care:  Pt very motivated to remain sober and feels he has been cared for properly during his stay. Pt appreciative of staff and the care he has received.   Patient/Family's Understanding of and Emotional Response to Diagnosis, Current Treatment, and Prognosis:  Pt understands that to remain sober he must commit himself daily and also understands this is a life long commitment. Pt very motivated to stay sober and feels changing his environment is huge for him to be successful.  Emotional Assessment Appearance:  Appears older than stated age Attitude/Demeanor/Rapport:  Other (Pleasant) Affect (typically observed):  Accepting Orientation:  Oriented to Self, Oriented to Situation, Oriented to Place, Oriented to  Time Alcohol / Substance use:  Alcohol Use Psych involvement (Current and /or in the community):  No (Comment)  Discharge Needs  Concerns to be addressed:  Substance Abuse Concerns Readmission within the last 30 days:  No Current discharge risk:  Substance Abuse Barriers to Discharge:  Continued Medical Work up   [place], [place], [place] 02/17/2017, 11:44 AM

## 2017-02-17 NOTE — Care Management Note (Signed)
Case Management Note  Patient Details  Name: Brett Molina MRN: [id number] Date of Birth: [birth year]  Subjective/Objective:      Detox and ETOH abuse              Action/Plan: Date:  February 17, 2017 Chart reviewed for concurrent status and case management needs. Will continue to follow patient progress. Discharge Planning: following for needs Expected discharge date: [id number] Marcelle Smiling, BSN, [place], [place]   [phone]  Expected Discharge Date:   (unknown)               Expected Discharge Plan:  Home/Self Care  In-House Referral:  Clinical Social Work  Discharge planning Services  CM Consult  Post Acute Care Choice:    Choice offered to:     DME Arranged:    DME Agency:     HH Arranged:    HH Agency:     Status of Service:  In process, will continue to follow  If discussed at Long Length of Stay Meetings, dates discussed:    Additional Comments:  Golda Acre, RN 02/17/2017, 8:48 AM

## 2017-02-17 NOTE — Progress Notes (Addendum)
PROGRESS NOTE        PATIENT DETAILS Name: Brett Molina Age: 26 y.o. Sex: male Date of Birth: [birth year] Admit Date: 02/16/2017 Admitting Physician Carron Curie, MD [id number], No Pcp Per  Brief Narrative: Patient is a 26 y.o. male with long-standing history of alcohol abuse, hypertension presented to the ED for management of alcohol withdrawal symptoms. See below for further details.  Subjective: Awake alert-some mild tremors. Claims last drink was on 8/13.  No chest pain No SOB No headache No Nausea vomiting or diarrhea   Assessment/Plan: Alcohol withdrawal: Is awake and alert, last drink was on 8/13-during his prior hospitalization-he had severe withdrawal symptoms on day 2 of hospitalization. Has mild tremors. Plans are to continue Ativan per protocol and continue monitoring closely in the stepdown unit for another 24 hours. I have counseled him regarding the importance of stopping alcohol use, but I'm not sure if he has the intention of doing so.  Hypertension: Relatively well controlled with amlodipine, hydralazine and labetalol. He is only 26 years old, and needs outpatient workup to evaluate for secondary causes of hypertension.  Minimally elevated liver enzymes: Likely secondary to alcohol use-although not in a classic pattern for alcoholic hepatitis. Plans are to monitor.   History of depression: Denies any depression-like symptoms to me, denies any suicidal/homicidal ideations this morning. Plans are to continue Seroquel and gabapentin along with all hands pain.  Telemetry (independently reviewed): Sinus rhythm/bouts of sinus tachycardia  Old records review: prior d/c summary done on 6/24 reviewed  Morning labs/Imaging ordered: yes  DVT Prophylaxis: Prophylactic Lovenox   Code Status: Full code   Family Communication: None at bedside  Disposition Plan: Remain inpatient  Antimicrobial agents: Anti-infectives    None       Procedures: None  CONSULTS:  None  Time spent: 25- minutes-Greater than 50% of this time was spent in counseling, explanation of diagnosis, planning of further management, and coordination of care.  MEDICATIONS: Scheduled Meds: . amLODipine  10 mg Oral Daily  . enoxaparin (LOVENOX) injection  40 mg Subcutaneous Q24H  . folic acid  1 mg Oral Daily  . gabapentin  100 mg Oral QHS  . gabapentin  300 mg Oral TID  . hydrALAZINE  50 mg Oral Q8H  . labetalol  300 mg Oral BID  . LORazepam  0-4 mg Intravenous Q6H   Or  . LORazepam  0-4 mg Oral Q6H  . [START ON 02/18/2017] LORazepam  0-4 mg Intravenous Q12H   Or  . [START ON 02/18/2017] LORazepam  0-4 mg Oral Q12H  . multivitamin with minerals  1 tablet Oral Daily  . OLANZapine  5 mg Oral QHS  . QUEtiapine  50 mg Oral TID  . sodium chloride flush  3 mL Intravenous Q12H  . thiamine  100 mg Oral Daily   Or  . thiamine  100 mg Intravenous Daily   Continuous Infusions: . sodium chloride     PRN Meds:.sodium chloride, acetaminophen **OR** acetaminophen, HYDROcodone-acetaminophen, metoprolol tartrate, ondansetron **OR** ondansetron (ZOFRAN) IV, sodium chloride flush, zolpidem   PHYSICAL EXAM: Vital signs: Vitals:   02/17/17 0800 02/17/17 0900 02/17/17 0904 02/17/17 1000  BP: (!) 155/105 (!) 158/87 (!) 158/87 (!) 152/78  Pulse:      Resp: (!) 9 (!) 22  15  Temp: 99 F (37.2 C)     TempSrc: Oral  SpO2: 100% 100%  98%  Weight:      Height:       Filed Weights   02/16/17 0728  Weight: 93 kg (205 lb)   Body mass index is 26.32 kg/m.   General appearance :Awake, alert, not in any distress. Speech Clear.  Eyes:, pupils equally reactive to light and accomodation,no scleral icterus. HEENT: Atraumatic and Normocephalic Neck: supple, no JVD. No cervical lymphadenopathy. Resp:Good air entry bilaterally, no added sounds  CVS: S1 S2 regular, no murmurs.  GI: Bowel sounds present, Non tender and not distended with no gaurding,  rigidity or rebound.No organomegaly Extremities: B/L Lower Ext shows no edema, both legs are warm to touch Neurology:  speech clear,Non focal, sensation is grossly intact. Psychiatric: Normal judgment and insight.  Musculoskeletal:No digital cyanosis Skin:No Rash, warm and dry Wounds:N/A  I have personally reviewed following labs and imaging studies  LABORATORY DATA: CBC:  Recent Labs Lab 02/16/17 0851  WBC 10.1  NEUTROABS 8.8*  HGB 12.3*  HCT 35.9*  MCV 96.0  PLT 360    Basic Metabolic Panel:  Recent Labs Lab 02/16/17 0851  NA 139  K 3.6  CL 97*  CO2 26  GLUCOSE 122*  BUN <5*  CREATININE 0.86  CALCIUM 9.8    GFR: Estimated Creatinine Clearance: 148.7 mL/min (by C-G formula based on SCr of 0.86 mg/dL).  Liver Function Tests:  Recent Labs Lab 02/16/17 0851  AST 51*  ALT 87*  ALKPHOS 60  BILITOT 0.9  PROT 7.9  ALBUMIN 4.8   No results for input(s): LIPASE, AMYLASE in the last 168 hours. No results for input(s): AMMONIA in the last 168 hours.  Coagulation Profile: No results for input(s): INR, PROTIME in the last 168 hours.  Cardiac Enzymes: No results for input(s): CKTOTAL, CKMB, CKMBINDEX, TROPONINI in the last 168 hours.  BNP (last 3 results) No results for input(s): PROBNP in the last 8760 hours.  HbA1C: No results for input(s): HGBA1C in the last 72 hours.  CBG: No results for input(s): GLUCAP in the last 168 hours.  Lipid Profile: No results for input(s): CHOL, HDL, LDLCALC, TRIG, CHOLHDL, LDLDIRECT in the last 72 hours.  Thyroid Function Tests: No results for input(s): TSH, T4TOTAL, FREET4, T3FREE, THYROIDAB in the last 72 hours.  Anemia Panel: No results for input(s): VITAMINB12, FOLATE, FERRITIN, TIBC, IRON, RETICCTPCT in the last 72 hours.  Urine analysis:    Component Value Date/Time   COLORURINE STRAW (A) 01/12/2017 1654   APPEARANCEUR CLEAR 01/12/2017 1654   LABSPEC 1.005 01/12/2017 1654   PHURINE 9.0 (H) 01/12/2017 1654    GLUCOSEU NEGATIVE 01/12/2017 1654   HGBUR NEGATIVE 01/12/2017 1654   BILIRUBINUR NEGATIVE 01/12/2017 1654   KETONESUR NEGATIVE 01/12/2017 1654   PROTEINUR 30 (A) 01/12/2017 1654   NITRITE NEGATIVE 01/12/2017 1654   LEUKOCYTESUR NEGATIVE 01/12/2017 1654    Sepsis Labs: Lactic Acid, Venous No results found for: LATICACIDVEN  MICROBIOLOGY: Recent Results (from the past 240 hour(s))  MRSA PCR Screening     Status: None   Collection Time: 02/16/17 12:40 PM  Result Value Ref Range Status   MRSA by PCR NEGATIVE NEGATIVE Final    Comment:        The GeneXpert MRSA Assay (FDA approved for NASAL specimens only), is one component of a comprehensive MRSA colonization surveillance program. It is not intended to diagnose MRSA infection nor to guide or monitor treatment for MRSA infections.     RADIOLOGY STUDIES/RESULTS: Ct Head Wo Contrast  Result  Date: 01/24/2017 CLINICAL DATA:  Fall down stairs EXAM: CT HEAD WITHOUT CONTRAST TECHNIQUE: Contiguous axial images were obtained from the base of the skull through the vertex without intravenous contrast. COMPARISON:  None. FINDINGS: Brain: No mass lesion, intraparenchymal hemorrhage or extra-axial collection. No evidence of acute cortical infarct. Brain parenchyma and CSF-containing spaces are normal for age. Vascular: No hyperdense vessel or unexpected calcification. Skull: Normal visualized skull base, calvarium and extracranial soft tissues. Sinuses/Orbits: No sinus fluid levels or advanced mucosal thickening. No mastoid effusion. Normal orbits. IMPRESSION: Normal head CT. Electronically Signed   By: Deatra Robinson M.D.   On: 01/24/2017 00:03   Ct Cervical Spine Wo Contrast  Result Date: 01/23/2017 CLINICAL DATA:  Fall down stairs with neck pain.  Initial encounter. EXAM: CT CERVICAL SPINE WITHOUT CONTRAST TECHNIQUE: Multidetector CT imaging of the cervical spine was performed without intravenous contrast. Multiplanar CT image  reconstructions were also generated. COMPARISON:  None. FINDINGS: Alignment: Normal. Skull base and vertebrae: No acute fracture. No primary bone lesion or focal pathologic process. Soft tissues and spinal canal: No prevertebral fluid or swelling. No visible canal hematoma. Disc levels:  No degenerative changes or evidence of impingement. Upper chest: Negative IMPRESSION: No evidence of cervical spine injury. Electronically Signed   By: Marnee Spring M.D.   On: 01/23/2017 20:16     LOS: 1 day   Jeoffrey Massed, MD  [facility] Pager:[phone]  If 7PM-7AM, please contact night-coverage [email or web address] Password [facility] 02/17/2017, 11:48 AM

## 2017-02-17 NOTE — Clinical Social Work Note (Addendum)
Pt to DC back to [facility] when medically stable. The facility per [facility] will pick up pt. Please call facility at [phone] for pick up.  Apolonio Cutting B. Gean Quint Clinical Social Work Dept Weekend Social Worker [phone] 11:55 AM

## 2017-02-18 DIAGNOSIS — F10239 Alcohol dependence with withdrawal, unspecified: Secondary | ICD-10-CM

## 2017-02-18 DIAGNOSIS — F1010 Alcohol abuse, uncomplicated: Secondary | ICD-10-CM

## 2017-02-18 DIAGNOSIS — F1099 Alcohol use, unspecified with unspecified alcohol-induced disorder: Secondary | ICD-10-CM

## 2017-02-18 LAB — COMPREHENSIVE METABOLIC PANEL
ALK PHOS: 55 U/L (ref 38–126)
ALT: 53 U/L (ref 17–63)
AST: 30 U/L (ref 15–41)
Albumin: 4.1 g/dL (ref 3.5–5.0)
Anion gap: 10 (ref 5–15)
BILIRUBIN TOTAL: 0.9 mg/dL (ref 0.3–1.2)
BUN: 6 mg/dL (ref 6–20)
CALCIUM: 9.3 mg/dL (ref 8.9–10.3)
CO2: 27 mmol/L (ref 22–32)
CREATININE: 0.81 mg/dL (ref 0.61–1.24)
Chloride: 104 mmol/L (ref 101–111)
Glucose, Bld: 96 mg/dL (ref 65–99)
Potassium: 3.2 mmol/L — ABNORMAL LOW (ref 3.5–5.1)
Sodium: 141 mmol/L (ref 135–145)
TOTAL PROTEIN: 7.1 g/dL (ref 6.5–8.1)

## 2017-02-18 LAB — MAGNESIUM: MAGNESIUM: 1.6 mg/dL — AB (ref 1.7–2.4)

## 2017-02-18 LAB — PHOSPHORUS: PHOSPHORUS: 5.5 mg/dL — AB (ref 2.5–4.6)

## 2017-02-18 MED ORDER — GABAPENTIN 300 MG PO CAPS: 300.0000 mg | ORAL_CAPSULE | Freq: Three times a day (TID) | ORAL | Status: DC

## 2017-02-18 MED ORDER — POTASSIUM CHLORIDE CRYS ER 20 MEQ PO TBCR
40.0000 meq | EXTENDED_RELEASE_TABLET | Freq: Once | ORAL | Status: AC
  Administered 2017-02-18: 40 meq via ORAL
  Filled 2017-02-18: qty 2

## 2017-02-18 MED ORDER — MAGNESIUM OXIDE 400 (241.3 MG) MG PO TABS
400.0000 mg | ORAL_TABLET | Freq: Two times a day (BID) | ORAL | Status: DC
  Administered 2017-02-18: 400 mg via ORAL
  Filled 2017-02-18: qty 1

## 2017-02-18 MED ORDER — MAGNESIUM SULFATE 2 GM/50ML IV SOLN
2.0000 g | Freq: Once | INTRAVENOUS | Status: AC
  Administered 2017-02-18: 2 g via INTRAVENOUS
  Filled 2017-02-18: qty 50

## 2017-02-18 NOTE — Progress Notes (Signed)
Date: February 18, 2017 Chart reviewed for discharge orders: None found for case management. Rhonda Davis,BSN,RN3, CCM/[phone] 

## 2017-02-18 NOTE — Progress Notes (Signed)
D/C to Home w/ friend ambulating per request.All D/C instructions given w/ verbal understanding.

## 2017-02-18 NOTE — Discharge Summary (Signed)
Physician Discharge Summary  Brett Molina OPF:[id number] DOB: 1990/09/10 DOA: 02/16/2017  PCP: Patient, No Pcp Per  Admit date: 02/16/2017 Discharge date: 02/18/2017  Recommendations for Outpatient Follow-up:  1. Recommend establishing with PCP for treatment of hypertension. Consider investigation for causes of secondary hypertension.   Discharge Diagnoses:  1. Alcohol withdrawal 2. Alcohol dependence 3. Essential hypertension 4. Elevated serum transaminases  Discharge Condition: Improved Disposition: [facility] for alcohol treatment  Diet recommendation: regular  Filed Weights   02/16/17 0728  Weight: 93 kg (205 lb)    History of present illness:  26 year old man PMH alcohol abuse, dependence presented with alcohol withdrawal.  Hospital Course:  Was admitted, treated with Ativan, supportive care with gradual clinical improvement. At the time of discharge she has not required Ativan for 24 hours. No evidence of significant withdrawal at this time. He will be going to [facility] house for ongoing alcohol treatment  Today's assessment: S: Feels well. No complaints.. O: Vitals: Afebrile, 99.0, 16, 88, 117/48, 100% on room air.   Constitutional. Appears calm, comfortable.  Respiratory. Clear to auscultation bilaterally. No wheezes, rales or rhonchi. Normal respiratory effort.  Cardiovascular. Regular rate and rhythm. No murmur, rub or gallop. No lower extremity edema.  Psychiatric. Grossly normal mood and affect. Speech fluent and appropriate. Oriented to self, location, month, year.  I have personally reviewed the following:   Labs:  Potassium 3.2, remainder basic metabolic panel unremarkable  Phosphorus slightly high, 5.5.  Magnesium slightly low, 1.6.  Transaminases within normal limits. Total bilirubin and alkaline phosphatase within normal limits.  Will replete with magnesium today, then ongoing multivitamin. Phosphorus should spontaneously normalized. He  has received potassium repletion.  Discharge Instructions  Discharge Instructions    Activity as tolerated - No restrictions    Complete by:  As directed    Diet general    Complete by:  As directed    Discharge instructions    Complete by:  As directed    Call your physician or seek immediate medical attention for confusion, fever, tremors, pain, shortness of breath or worsening of condition. Do not drink any alcohol.     Allergies as of 02/18/2017   No Known Allergies     Medication List    STOP taking these medications   ibuprofen 200 MG tablet Commonly known as:  ADVIL,MOTRIN   QUEtiapine 50 MG tablet Commonly known as:  SEROQUEL     TAKE these medications   amLODipine 10 MG tablet Commonly known as:  NORVASC Take 1 tablet (10 mg total) by mouth daily.   folic acid 1 MG tablet Commonly known as:  FOLVITE Take 1 tablet (1 mg total) by mouth daily.   gabapentin 300 MG capsule Commonly known as:  NEURONTIN Take 1 capsule (300 mg total) by mouth 3 (three) times daily. What changed:  Another medication with the same name was removed. Continue taking this medication, and follow the directions you see here.   hydrALAZINE 50 MG tablet Commonly known as:  APRESOLINE Take 1 tablet (50 mg total) by mouth every 8 (eight) hours.   labetalol 300 MG tablet Commonly known as:  NORMODYNE Take 1 tablet (300 mg total) by mouth 2 (two) times daily.   multivitamin with minerals Tabs tablet Take 1 tablet by mouth daily.   thiamine 100 MG tablet Take 1 tablet (100 mg total) by mouth daily.      No Known Allergies  The results of significant diagnostics from this hospitalization (including imaging, microbiology, ancillary  and laboratory) are listed below for reference.    Significant Diagnostic Studies: Ct Head Wo Contrast  Result Date: 01/24/2017 CLINICAL DATA:  Fall down stairs EXAM: CT HEAD WITHOUT CONTRAST TECHNIQUE: Contiguous axial images were obtained from the base  of the skull through the vertex without intravenous contrast. COMPARISON:  None. FINDINGS: Brain: No mass lesion, intraparenchymal hemorrhage or extra-axial collection. No evidence of acute cortical infarct. Brain parenchyma and CSF-containing spaces are normal for age. Vascular: No hyperdense vessel or unexpected calcification. Skull: Normal visualized skull base, calvarium and extracranial soft tissues. Sinuses/Orbits: No sinus fluid levels or advanced mucosal thickening. No mastoid effusion. Normal orbits. IMPRESSION: Normal head CT. Electronically Signed   By: Deatra Robinson M.D.   On: 01/24/2017 00:03   Ct Cervical Spine Wo Contrast  Result Date: 01/23/2017 CLINICAL DATA:  Fall down stairs with neck pain.  Initial encounter. EXAM: CT CERVICAL SPINE WITHOUT CONTRAST TECHNIQUE: Multidetector CT imaging of the cervical spine was performed without intravenous contrast. Multiplanar CT image reconstructions were also generated. COMPARISON:  None. FINDINGS: Alignment: Normal. Skull base and vertebrae: No acute fracture. No primary bone lesion or focal pathologic process. Soft tissues and spinal canal: No prevertebral fluid or swelling. No visible canal hematoma. Disc levels:  No degenerative changes or evidence of impingement. Upper chest: Negative IMPRESSION: No evidence of cervical spine injury. Electronically Signed   By: Marnee Spring M.D.   On: 01/23/2017 20:16    Microbiology: Recent Results (from the past 240 hour(s))  MRSA PCR Screening     Status: None   Collection Time: 02/16/17 12:40 PM  Result Value Ref Range Status   MRSA by PCR NEGATIVE NEGATIVE Final    Comment:        The GeneXpert MRSA Assay (FDA approved for NASAL specimens only), is one component of a comprehensive MRSA colonization surveillance program. It is not intended to diagnose MRSA infection nor to guide or monitor treatment for MRSA infections.      Labs: Basic Metabolic Panel:  Recent Labs Lab 02/16/17 0851  02/18/17 0228  NA 139 141  K 3.6 3.2*  CL 97* 104  CO2 26 27  GLUCOSE 122* 96  BUN <5* 6  CREATININE 0.86 0.81  CALCIUM 9.8 9.3  MG  --  1.6*  PHOS  --  5.5*   Liver Function Tests:  Recent Labs Lab 02/16/17 0851 02/18/17 0228  AST 51* 30  ALT 87* 53  ALKPHOS 60 55  BILITOT 0.9 0.9  PROT 7.9 7.1  ALBUMIN 4.8 4.1   CBC:  Recent Labs Lab 02/16/17 0851  WBC 10.1  NEUTROABS 8.8*  HGB 12.3*  HCT 35.9*  MCV 96.0  PLT 360    Principal Problem:   Alcohol withdrawal (HCC) Active Problems:   Alcohol use disorder (HCC)   Time coordinating discharge: 35 minutes  Signed:  Brendia Sacks, MD [facility] 02/18/2017, 10:50 AM

## 2017-03-27 ENCOUNTER — Encounter (HOSPITAL_COMMUNITY): Payer: Self-pay | Admitting: Emergency Medicine

## 2017-03-27 DIAGNOSIS — F1019 Alcohol abuse with unspecified alcohol-induced disorder: Secondary | ICD-10-CM | POA: Insufficient documentation

## 2017-03-27 DIAGNOSIS — Z79899 Other long term (current) drug therapy: Secondary | ICD-10-CM | POA: Insufficient documentation

## 2017-03-27 LAB — BASIC METABOLIC PANEL
Anion gap: 17 — ABNORMAL HIGH (ref 5–15)
CHLORIDE: 100 mmol/L — AB (ref 101–111)
CO2: 25 mmol/L (ref 22–32)
Calcium: 9.6 mg/dL (ref 8.9–10.3)
Creatinine, Ser: 0.75 mg/dL (ref 0.61–1.24)
GFR calc Af Amer: >60 mL/min (ref >60)
GFR calc non Af Amer: >60 mL/min (ref >60)
Glucose, Bld: 107 mg/dL — ABNORMAL HIGH (ref 65–99)
Potassium: 4.2 mmol/L (ref 3.5–5.1)
SODIUM: 142 mmol/L (ref 135–145)

## 2017-03-27 LAB — CBC
HCT: 43.4 % (ref 39.0–52.0)
Hemoglobin: 15 g/dL (ref 13.0–17.0)
MCH: 32.1 pg (ref 26.0–34.0)
MCHC: 34.6 g/dL (ref 30.0–36.0)
MCV: 92.7 fL (ref 78.0–100.0)
PLATELETS: 393 10*3/uL (ref 150–400)
RBC: 4.68 MIL/uL (ref 4.22–5.81)
RDW: 13.3 % (ref 11.5–15.5)
WBC: 6.3 10*3/uL (ref 4.0–10.5)

## 2017-03-27 NOTE — ED Triage Notes (Signed)
Patient BIB brother, reports he has been binge drinking approximately a fifth of liquor a day x5 days. Reports decreased appetite since that time. Denies N/V/D. Reports drinking "a couple of beers" today. Hx alcohol abuse.

## 2017-03-28 ENCOUNTER — Emergency Department (HOSPITAL_COMMUNITY)
Admission: EM | Admit: 2017-03-28 | Discharge: 2017-03-28 | Disposition: A | Discharge status: Home / Self Care | Payer: Self-pay | Attending: Emergency Medicine | Admitting: Emergency Medicine

## 2017-03-28 DIAGNOSIS — F1010 Alcohol abuse, uncomplicated: Secondary | ICD-10-CM

## 2017-03-28 LAB — I-STAT CHEM 8, ED
BUN: <3 mg/dL — ABNORMAL LOW (ref 6–20)
CHLORIDE: 102 mmol/L (ref 101–111)
CREATININE: 0.9 mg/dL (ref 0.61–1.24)
Calcium, Ion: 1.01 mmol/L — ABNORMAL LOW (ref 1.15–1.40)
GLUCOSE: 196 mg/dL — AB (ref 65–99)
HEMATOCRIT: 38 % — AB (ref 39.0–52.0)
HEMOGLOBIN: 12.9 g/dL — AB (ref 13.0–17.0)
Potassium: 4 mmol/L (ref 3.5–5.1)
Sodium: 140 mmol/L (ref 135–145)
TCO2: 26 mmol/L (ref 22–32)

## 2017-03-28 LAB — URINALYSIS, ROUTINE W REFLEX MICROSCOPIC
Bilirubin Urine: NEGATIVE
GLUCOSE, UA: 150 mg/dL — AB
Ketones, ur: 5 mg/dL — AB
Leukocytes, UA: NEGATIVE
NITRITE: NEGATIVE
PH: 5 (ref 5.0–8.0)
Protein, ur: 100 mg/dL — AB
Specific Gravity, Urine: 1.015 (ref 1.005–1.030)

## 2017-03-28 LAB — RAPID URINE DRUG SCREEN, HOSP PERFORMED
Amphetamines: NOT DETECTED
BENZODIAZEPINES: NOT DETECTED
Barbiturates: NOT DETECTED
Cocaine: NOT DETECTED
OPIATES: NOT DETECTED
TETRAHYDROCANNABINOL: NOT DETECTED

## 2017-03-28 MED ORDER — ONDANSETRON HCL 4 MG/2ML IJ SOLN
4.0000 mg | Freq: Once | INTRAMUSCULAR | Status: AC
  Administered 2017-03-28: 4 mg via INTRAVENOUS
  Filled 2017-03-28: qty 2

## 2017-03-28 MED ORDER — THIAMINE HCL 100 MG/ML IJ SOLN
Freq: Once | INTRAVENOUS | Status: AC
  Administered 2017-03-28: 03:00:00 via INTRAVENOUS
  Filled 2017-03-28: qty 1000

## 2017-03-28 NOTE — ED Notes (Signed)
Pt was given ginger ale for po challenge---- pt tolerating well; was observed taking sips without problem.

## 2017-03-28 NOTE — ED Provider Notes (Signed)
[facility]-EMERGENCY DEPT Provider Note   CSN: [id number] Arrival date & time: 03/27/17  1816     History   Chief Complaint Chief Complaint  Patient presents with  . Alcohol Problem    HPI Brett Molina is a 26 y.o. male.  The history is provided by the patient.  Alcohol Problem  This is a chronic problem. The current episode started more than 1 week ago. The problem occurs constantly. The problem has not changed since onset.Pertinent negatives include no chest pain, no abdominal pain, no headaches and no shortness of breath. Nothing aggravates the symptoms. Nothing relieves the symptoms. He has tried nothing for the symptoms. The treatment provided no relief.  Has been drinking and now has nausea and diarrhea  Past Medical History:  Diagnosis Date  . ETOH abuse     Patient Active Problem List   Diagnosis Date Noted  . Alcohol use disorder (HCC) 02/18/2017  . Alcohol withdrawal (HCC) 02/16/2017  . Alcoholic intoxication without complication (HCC) 12/12/2016  . Hypokalemia 12/12/2016  . Alcoholic hepatitis without ascites 12/12/2016  . MDD (major depressive disorder) 12/12/2016    History reviewed. No pertinent surgical history.     Home Medications    Prior to Admission medications   Medication Sig Start Date End Date Taking? Authorizing Provider  amLODipine (NORVASC) 10 MG tablet Take 1 tablet (10 mg total) by mouth daily. Patient not taking: Reported on 02/16/2017 12/27/16   Glade Lloyd, MD  folic acid (FOLVITE) 1 MG tablet Take 1 tablet (1 mg total) by mouth daily. Patient not taking: Reported on 02/16/2017 12/27/16   Glade Lloyd, MD  gabapentin (NEURONTIN) 300 MG capsule Take 1 capsule (300 mg total) by mouth 3 (three) times daily. Patient not taking: Reported on 03/28/2017 02/18/17   Standley Brooking, MD  hydrALAZINE (APRESOLINE) 50 MG tablet Take 1 tablet (50 mg total) by mouth every 8 (eight) hours. Patient not taking: Reported on 02/16/2017 12/27/16   Glade Lloyd, MD  labetalol (NORMODYNE) 300 MG tablet Take 1 tablet (300 mg total) by mouth 2 (two) times daily. Patient not taking: Reported on 02/16/2017 12/27/16   Glade Lloyd, MD  Multiple Vitamin (MULTIVITAMIN WITH MINERALS) TABS tablet Take 1 tablet by mouth daily. Patient not taking: Reported on 02/16/2017 12/27/16   Glade Lloyd, MD  thiamine 100 MG tablet Take 1 tablet (100 mg total) by mouth daily. Patient not taking: Reported on 02/16/2017 12/27/16   Glade Lloyd, MD    Family History No family history on file.  Social History Social History  Substance Use Topics  . Smoking status: Never Smoker  . Smokeless tobacco: Never Used  . Alcohol use 8.4 oz/week    14 Standard drinks or equivalent per week     Comment: Patient drank vodka yesterday     Allergies   Patient has no known allergies.   Review of Systems Review of Systems  Constitutional: Negative for diaphoresis and fever.  Eyes: Negative for photophobia.  Respiratory: Negative for shortness of breath.   Cardiovascular: Negative for chest pain.  Gastrointestinal: Positive for diarrhea. Negative for abdominal pain and rectal pain.  Neurological: Negative for headaches.  All other systems reviewed and are negative.    Physical Exam Updated Vital Signs BP (!) 143/96   Pulse (!) 119   Temp 98.6 F (37 C) (Oral)   Resp 18   SpO2 98%   Physical Exam  Constitutional: He appears well-developed and well-nourished. No distress.  HENT:  Head: Normocephalic and  atraumatic.  Mouth/Throat: No oropharyngeal exudate.  Eyes: Pupils are equal, round, and reactive to light. Conjunctivae and EOM are normal.  Neck: Normal range of motion. Neck supple.  Cardiovascular: Normal rate, regular rhythm, normal heart sounds and intact distal pulses.   Pulmonary/Chest: Effort normal and breath sounds normal. He has no wheezes. He has no rales.  Abdominal: Soft. Bowel sounds are normal. He exhibits no mass. There is no tenderness.  There is no rebound and no guarding.  Skin: Skin is warm and dry.     ED Treatments / Results  Labs (all labs ordered are listed, but only abnormal results are displayed) Labs Reviewed  BASIC METABOLIC PANEL - Abnormal; Notable for the following:       Result Value   Chloride 100 (*)    Glucose, Bld 107 (*)    BUN <5 (*)    Anion gap 17 (*)    All other components within normal limits  CBC  URINALYSIS, ROUTINE W REFLEX MICROSCOPIC  CBG MONITORING, ED    EKG  EKG Interpretation  Date/Time:  Saturday March 27 2017 19:39:13 EDT Ventricular Rate:  116 PR Interval:    QRS Duration: 82 QT Interval:  310 QTC Calculation: 431 R Axis:   74 Text Interpretation:  Sinus tachycardia Confirmed by Prentiss Hammett ([id number]) on 03/28/2017 1:40:52 AM       Radiology No results found.  Procedures Procedures (including critical care time)  Medications Ordered in ED Medications  ondansetron (ZOFRAN) injection 4 mg (4 mg Intravenous Given 03/28/17 0214)  sodium chloride 0.9 % 1,000 mL with thiamine 100 mg, folic acid 1 mg, multivitamins adult 10 mL, magnesium sulfate 2 g infusion ( Intravenous New Bag/Given 03/28/17 0232)     Treated for AKA, patient sleeping soundly.  PO challenged successfully.  No further anion gap. There are no grounds for medical or psychiatric history at this time.  Stable for discharge  Final Clinical Impressions(s) / ED Diagnoses   Return for fevers, tachycardia, seeing things that aren't there, increased blood pressure, intractable vomiting or any concerns.  Shortness of breath, swelling or the lips or tongue, chest pain, dyspnea on exertion, new weakness or numbness changes in vision or speech,  Inability to tolerate liquids or food, changes in voice cough, altered mental status or any concerns. No signs of systemic illness or infection. The patient is nontoxic-appearing on exam and vital signs are within normal limits.    I have reviewed the triage vital  signs and the nursing notes. Pertinent labs &imaging results that were available during my care of the patient were reviewed by me and considered in my medical decision making (see chart for details).  After history, exam, and medical workup I feel the patient has been appropriately medically screened and is safe for discharge home. Pertinent diagnoses were discussed with the patient. Patient was given return precautions.    Camillia Marcy, MD 03/28/17 [phone]

## 2017-03-29 ENCOUNTER — Emergency Department (HOSPITAL_COMMUNITY)
Admission: EM | Admit: 2017-03-29 | Discharge: 2017-03-29 | Disposition: A | Discharge status: Home / Self Care | Payer: Self-pay | Attending: Emergency Medicine | Admitting: Emergency Medicine

## 2017-03-29 ENCOUNTER — Encounter (HOSPITAL_COMMUNITY): Payer: Self-pay

## 2017-03-29 DIAGNOSIS — I10 Essential (primary) hypertension: Secondary | ICD-10-CM | POA: Insufficient documentation

## 2017-03-29 DIAGNOSIS — F1010 Alcohol abuse, uncomplicated: Secondary | ICD-10-CM

## 2017-03-29 DIAGNOSIS — F10920 Alcohol use, unspecified with intoxication, uncomplicated: Secondary | ICD-10-CM | POA: Insufficient documentation

## 2017-03-29 HISTORY — DX: Essential (primary) hypertension: I10

## 2017-03-29 LAB — RAPID URINE DRUG SCREEN, HOSP PERFORMED
Amphetamines: NOT DETECTED
Barbiturates: NOT DETECTED
Benzodiazepines: NOT DETECTED
COCAINE: NOT DETECTED
OPIATES: NOT DETECTED
TETRAHYDROCANNABINOL: NOT DETECTED

## 2017-03-29 LAB — URINALYSIS, ROUTINE W REFLEX MICROSCOPIC
BILIRUBIN URINE: NEGATIVE
GLUCOSE, UA: NEGATIVE mg/dL
Ketones, ur: NEGATIVE mg/dL
Leukocytes, UA: NEGATIVE
Nitrite: NEGATIVE
PH: 6 (ref 5.0–8.0)

## 2017-03-29 LAB — BASIC METABOLIC PANEL
Anion gap: 13 (ref 5–15)
CHLORIDE: 105 mmol/L (ref 101–111)
CO2: 28 mmol/L (ref 22–32)
CREATININE: 0.8 mg/dL (ref 0.61–1.24)
Calcium: 9 mg/dL (ref 8.9–10.3)
GFR calc Af Amer: >60 mL/min (ref >60)
GFR calc non Af Amer: >60 mL/min (ref >60)
GLUCOSE: 96 mg/dL (ref 65–99)
Potassium: 4.3 mmol/L (ref 3.5–5.1)
SODIUM: 146 mmol/L — AB (ref 135–145)

## 2017-03-29 LAB — CBC
HCT: 41 % (ref 39.0–52.0)
HEMOGLOBIN: 13.9 g/dL (ref 13.0–17.0)
MCH: 31.7 pg (ref 26.0–34.0)
MCHC: 33.9 g/dL (ref 30.0–36.0)
MCV: 93.4 fL (ref 78.0–100.0)
Platelets: 386 10*3/uL (ref 150–400)
RBC: 4.39 MIL/uL (ref 4.22–5.81)
RDW: 13.7 % (ref 11.5–15.5)
WBC: 7.3 10*3/uL (ref 4.0–10.5)

## 2017-03-29 LAB — ETHANOL: Alcohol, Ethyl (B): 457 mg/dL (ref <5)

## 2017-03-29 LAB — URINALYSIS, MICROSCOPIC (REFLEX)

## 2017-03-29 MED ORDER — SODIUM CHLORIDE 0.9 % IV BOLUS (SEPSIS)
1000.0000 mL | Freq: Once | INTRAVENOUS | Status: AC
  Administered 2017-03-29: 1000 mL via INTRAVENOUS

## 2017-03-29 MED ORDER — CHLORDIAZEPOXIDE HCL 25 MG PO CAPS: ORAL_CAPSULE | ORAL | 0 refills | Status: DC

## 2017-03-29 MED ORDER — ONDANSETRON HCL 4 MG/2ML IJ SOLN
4.0000 mg | Freq: Once | INTRAMUSCULAR | Status: AC
  Administered 2017-03-29: 4 mg via INTRAVENOUS
  Filled 2017-03-29: qty 2

## 2017-03-29 NOTE — ED Notes (Signed)
Patient ambulatory with PA in hallway.

## 2017-03-29 NOTE — ED Notes (Signed)
Per patient request, patient's brother called for transport. Patient's brother reports patient to wait in lobby and he would send ride.

## 2017-03-29 NOTE — ED Notes (Signed)
Date and time results received: 03/29/17 1:22 PM  (use smartphrase ".now" to insert current time)  Test: Ethanol Critical Value: 457  Name of Provider Notified: Jodi Geralds, PA  Orders Received? Or Actions Taken?: Waiting for orders

## 2017-03-29 NOTE — ED Notes (Signed)
Pt verbalizing desire to quit drinking and open to getting resources for drinking and possible therapy. Pt states he has been coping negatively since his mom passed in Jan. Spoke with PA Adelina Mings and is on the same page with getting him possible resources. Adelina Mings will speak to him in regards to outpatient resources.

## 2017-03-29 NOTE — ED Notes (Signed)
Eth- 457 Ace

## 2017-03-29 NOTE — ED Triage Notes (Signed)
Pt BIB EMS- EMS states brother called due to increased lethargy, weakness, decreased appetite after drinking over the last two days. Hx of alcohol abuse. Pt reports he drank "a lot." Pt was seen here x2 days ago for the same thing. EMS VS: BP: 150/100, HR 105, O2 98% room air, CBG 110.

## 2017-03-29 NOTE — ED Provider Notes (Signed)
[facility]-EMERGENCY DEPT Provider Note   CSN: [id number] Arrival date & time: 03/29/17  1057     History   Chief Complaint Chief Complaint  Patient presents with  . Alcohol Intoxication    HPI  Brett Molina is a 26 y.o. Male with history of alcohol abuse, patient presents via EMS after his brother called due to increased lethargy weakness and a decreased appetite after binge drinking for the past 2 days. Patient reports he drinks "a lot" and has had both wine and liquor over the past 2 days. Patient was seen here 2 days ago for the same thing. Patient has no complaints, denies any headaches, vision changes, chest pain, shortness of breath, abdominal pain, nausea or vomiting. Patient denies any falls or injuries. Patient reports he lost his mom earlier this year and this is been the main stressor driving his drinking.      Past Medical History:  Diagnosis Date  . ETOH abuse   . Hypertension     Patient Active Problem List   Diagnosis Date Noted  . Alcohol use disorder (HCC) 02/18/2017  . Alcohol withdrawal (HCC) 02/16/2017  . Alcoholic intoxication without complication (HCC) 12/12/2016  . Hypokalemia 12/12/2016  . Alcoholic hepatitis without ascites 12/12/2016  . MDD (major depressive disorder) 12/12/2016    History reviewed. No pertinent surgical history.     Home Medications    Prior to Admission medications   Medication Sig Start Date End Date Taking? Authorizing Provider  amLODipine (NORVASC) 10 MG tablet Take 1 tablet (10 mg total) by mouth daily. Patient not taking: Reported on 02/16/2017 12/27/16   Glade Lloyd, MD  folic acid (FOLVITE) 1 MG tablet Take 1 tablet (1 mg total) by mouth daily. Patient not taking: Reported on 02/16/2017 12/27/16   Glade Lloyd, MD  gabapentin (NEURONTIN) 300 MG capsule Take 1 capsule (300 mg total) by mouth 3 (three) times daily. Patient not taking: Reported on 03/28/2017 02/18/17   Standley Brooking, MD  hydrALAZINE (APRESOLINE) 50  MG tablet Take 1 tablet (50 mg total) by mouth every 8 (eight) hours. Patient not taking: Reported on 02/16/2017 12/27/16   Glade Lloyd, MD  labetalol (NORMODYNE) 300 MG tablet Take 1 tablet (300 mg total) by mouth 2 (two) times daily. Patient not taking: Reported on 02/16/2017 12/27/16   Glade Lloyd, MD  Multiple Vitamin (MULTIVITAMIN WITH MINERALS) TABS tablet Take 1 tablet by mouth daily. Patient not taking: Reported on 02/16/2017 12/27/16   Glade Lloyd, MD  thiamine 100 MG tablet Take 1 tablet (100 mg total) by mouth daily. Patient not taking: Reported on 02/16/2017 12/27/16   Glade Lloyd, MD    Family History History reviewed. No pertinent family history.  Social History Social History  Substance Use Topics  . Smoking status: Never Smoker  . Smokeless tobacco: Never Used  . Alcohol use 8.4 oz/week    14 Standard drinks or equivalent per week     Comment: Patient drank vodka yesterday     Allergies   Patient has no known allergies.   Review of Systems Review of Systems  Constitutional: Negative for chills and fever.  HENT: Negative for congestion, rhinorrhea and sore throat.   Eyes: Negative for photophobia and visual disturbance.  Respiratory: Negative for cough, chest tightness and shortness of breath.   Cardiovascular: Negative for chest pain and palpitations.  Gastrointestinal: Negative for abdominal pain, nausea and vomiting.  Genitourinary: Negative for difficulty urinating and dysuria.  Musculoskeletal: Negative for arthralgias and myalgias.  Skin:  Negative for pallor and rash.  Neurological: Negative for dizziness, weakness, numbness and headaches.  Psychiatric/Behavioral: Negative for agitation, behavioral problems, hallucinations, self-injury and suicidal ideas.     Physical Exam Updated Vital Signs BP (!) 150/94 (BP Location: Left Arm)   Pulse 86   Temp 98.4 F (36.9 C) (Oral)   Resp 14   SpO2 98%   Physical Exam  Constitutional: He is oriented  to person, place, and time. He appears well-developed and well-nourished. No distress.  HENT:  Head: Normocephalic and atraumatic.  Eyes: Pupils are equal, round, and reactive to light. EOM are normal. Right eye exhibits no discharge. Left eye exhibits no discharge.  Neck: Neck supple.  Cardiovascular: Regular rhythm, normal heart sounds and intact distal pulses.   Tachycardic  Pulmonary/Chest: Effort normal and breath sounds normal. No respiratory distress.  Abdominal: Soft. Bowel sounds are normal. He exhibits no mass. There is no tenderness. There is no guarding.  Musculoskeletal: He exhibits no edema or deformity.  Neurological: He is alert and oriented to person, place, and time. Coordination normal.  Speech is clear, able to follow commands CN III-XII intact Normal strength in upper and lower extremities bilaterally including dorsiflexion and plantar flexion, strong and equal grip strength Sensation normal to light and sharp touch Moves extremities without ataxia, coordination intact  Skin: Skin is warm and dry. Capillary refill takes less than 2 seconds. He is not diaphoretic.  Psychiatric: He has a normal mood and affect. His behavior is normal.  Nursing note and vitals reviewed.    ED Treatments / Results  Labs (all labs ordered are listed, but only abnormal results are displayed) Labs Reviewed  ETHANOL - Abnormal; Notable for the following:       Result Value   Alcohol, Ethyl (B) 457 (*)    All other components within normal limits  URINALYSIS, ROUTINE W REFLEX MICROSCOPIC - Abnormal; Notable for the following:    Specific Gravity, Urine <1.005 (*)    Hgb urine dipstick TRACE (*)    Protein, ur TRACE (*)    All other components within normal limits  URINALYSIS, MICROSCOPIC (REFLEX) - Abnormal; Notable for the following:    Bacteria, UA RARE (*)    Squamous Epithelial / LPF 0-5 (*)    All other components within normal limits  RAPID URINE DRUG SCREEN, HOSP PERFORMED    CBC  BASIC METABOLIC PANEL    EKG  EKG Interpretation None       Radiology No results found.  Procedures Procedures (including critical care time)  Medications Ordered in ED Medications  sodium chloride 0.9 % bolus 1,000 mL (not administered)  sodium chloride 0.9 % bolus 1,000 mL (0 mLs Intravenous Stopped 03/29/17 1451)     Initial Impression / Assessment and Plan / ED Course  I have reviewed the triage vital signs and the nursing notes.  Pertinent labs & imaging results that were available during my care of the patient were reviewed by me and considered in my medical decision making (see chart for details).  Patient presents with acute alcohol intoxication, tachycardic and hypertensive on initial evaluation, patient has not been taking his blood pressure medications recently and patient has been tachycardic with all previous admissions for intoxication. Patient has no specific complaints but is clearly intoxicated, denies chest pain, shortness of breath, abdominal pain, headaches, vision changes. No evidence of alcohol withdrawal. Patient is alert and oriented with no neurologic deficits. Will obtain alcohol level and general labs and provide IV fluids.  Alcohol level 457, labs otherwise unremarkable, UDS negative for other substances. No anion gap or metabolic derangements requiring replacements here in the ED, UA and CBC did not suggest infection. Patient is now awake and more alert, and requests help with mental health counseling and his alcohol abuse. Patient reports he recently lost his mother emesis been the main stressor contributing to his alcohol use. Discussed going to [facility] tomorrow for help with detox and counseling and patient is amenable to this plan. Zofran for nausea. Encouraged patient to try small frequent meals to combat nausea. Patient can be discharged home as long as he can tolerate by mouth fluids and walk stably.  At shift change care was transferred to  [place] Russo, PA-C who will follow pending studies, re-evaulate and determine disposition.      Final Clinical Impressions(s) / ED Diagnoses   Final diagnoses:  Alcoholic intoxication without complication (HCC)  Alcohol abuse    New Prescriptions New Prescriptions   No medications on file      Legrand Rams 03/29/17 2038    Tegeler, Canary Brim, MD 03/31/17 2030

## 2017-03-29 NOTE — ED Provider Notes (Signed)
Care assumed from [facility], [place], pending pt ambulation and PO challenge. Alcohol level 457. BMP unremarkable. Pt presenting with alcohol intoxication, no injuries. Pt monitored in ED for extended period of time to sober enough for safe discharge. Physical Exam  BP (!) 150/88   Pulse 93   Temp 98.4 F (36.9 C) (Oral)   Resp 18   SpO2 100%   Physical Exam  Constitutional: He is oriented to person, place, and time. He appears well-developed and well-nourished.  HENT:  Head: Normocephalic and atraumatic.  Eyes: Conjunctivae are normal.  Cardiovascular:  tachycardic  Pulmonary/Chest: Effort normal.  Abdominal: Soft.  Neurological: He is alert and oriented to person, place, and time.  Steady gait  Skin: Skin is warm.  Psychiatric: He has a normal mood and affect. His behavior is normal.  Nursing note and vitals reviewed.   ED Course  Procedures  MDM Pt ambulated soon after handoff of care, however pt w mildly unsteady gait. Pt monitored for some more time, and I personally ambulated pt and he has steady gait prior to discharge. Tolerating PO.  Pt tachycardic in ED, however per chart review pt with tachycardia during all documented ED visits. CIWA score 1. Discussed librium taper with patient and had in-depth conversation with patient and explained risks of drinking EtOH while taking librium. Pt verbalized understanding that EtOH use with librium could lead to death. He states his intentions are to avoid alcohol tonight and report to [facility] in the morning with the company of his brother. Pt is safe for discharge.  Pt discussed with Dr. Erma Heritage for recommendations about care plan.  Discussed results, findings, treatment and follow up. Patient advised of return precautions. Patient verbalized understanding and agreed with plan.        Russo, [place] N, PA-C 03/29/17 2000    Tegeler, Canary Brim, MD 03/31/17 2030

## 2017-03-29 NOTE — Discharge Instructions (Addendum)
It is important that you DO NOT drink alcohol with librium, that can lead to death. Follow-up with [facility] tomorrow for help with mental health counseling as well as alcohol abuse. Recommended to patient discussing medications to help with detox at [facility]. Return to ED for new or concerning symptoms.  Get help right away if: You get shaky when you try to stop drinking. You start shaking and you cannot control it (have a seizure). You throw up blood. The blood may be bright red or look like coffee grounds. You see blood in your poop (stool). You start to feel light-headed. You pass out.

## 2017-03-29 NOTE — ED Notes (Addendum)
Pt able to walk with little assist, given ginger ale

## 2017-05-13 ENCOUNTER — Emergency Department (HOSPITAL_COMMUNITY)
Admission: EM | Admit: 2017-05-13 | Discharge: 2017-05-14 | Disposition: A | Discharge status: Home / Self Care | Payer: Self-pay | Attending: Emergency Medicine | Admitting: Emergency Medicine

## 2017-05-13 ENCOUNTER — Other Ambulatory Visit: Payer: Self-pay

## 2017-05-13 DIAGNOSIS — Z79899 Other long term (current) drug therapy: Secondary | ICD-10-CM | POA: Insufficient documentation

## 2017-05-13 DIAGNOSIS — F10920 Alcohol use, unspecified with intoxication, uncomplicated: Secondary | ICD-10-CM | POA: Insufficient documentation

## 2017-05-13 DIAGNOSIS — I10 Essential (primary) hypertension: Secondary | ICD-10-CM | POA: Insufficient documentation

## 2017-05-13 LAB — COMPREHENSIVE METABOLIC PANEL
ALK PHOS: 55 U/L (ref 38–126)
ALT: 29 U/L (ref 17–63)
ANION GAP: 13 (ref 5–15)
AST: 29 U/L (ref 15–41)
Albumin: 4.7 g/dL (ref 3.5–5.0)
BUN: 9 mg/dL (ref 6–20)
CO2: 25 mmol/L (ref 22–32)
CREATININE: 0.97 mg/dL (ref 0.61–1.24)
Calcium: 9.1 mg/dL (ref 8.9–10.3)
Chloride: 109 mmol/L (ref 101–111)
Glucose, Bld: 96 mg/dL (ref 65–99)
Potassium: 3.9 mmol/L (ref 3.5–5.1)
SODIUM: 147 mmol/L — AB (ref 135–145)
Total Bilirubin: 0.5 mg/dL (ref 0.3–1.2)
Total Protein: 8 g/dL (ref 6.5–8.1)

## 2017-05-13 LAB — CBC
HEMATOCRIT: 42.4 % (ref 39.0–52.0)
Hemoglobin: 14.3 g/dL (ref 13.0–17.0)
MCH: 31.6 pg (ref 26.0–34.0)
MCHC: 33.7 g/dL (ref 30.0–36.0)
MCV: 93.8 fL (ref 78.0–100.0)
Platelets: 308 10*3/uL (ref 150–400)
RBC: 4.52 MIL/uL (ref 4.22–5.81)
RDW: 12.9 % (ref 11.5–15.5)
WBC: 7.4 10*3/uL (ref 4.0–10.5)

## 2017-05-13 LAB — ETHANOL: Alcohol, Ethyl (B): 449 mg/dL (ref <10)

## 2017-05-13 LAB — SALICYLATE LEVEL: Salicylate Lvl: <7 mg/dL (ref 2.8–30.0)

## 2017-05-13 LAB — ACETAMINOPHEN LEVEL: Acetaminophen (Tylenol), Serum: <10 ug/mL — ABNORMAL LOW (ref 10–30)

## 2017-05-13 MED ORDER — SODIUM CHLORIDE 0.9 % IV SOLN
1000.0000 mL | INTRAVENOUS | Status: DC
  Administered 2017-05-13 (×2): 1000 mL via INTRAVENOUS

## 2017-05-13 MED ORDER — SODIUM CHLORIDE 0.9 % IV BOLUS (SEPSIS): 1000.0000 mL | Freq: Once | INTRAVENOUS | Status: DC

## 2017-05-13 MED ORDER — SODIUM CHLORIDE 0.9 % IV BOLUS (SEPSIS)
1000.0000 mL | Freq: Once | INTRAVENOUS | Status: AC
  Administered 2017-05-13: 1000 mL via INTRAVENOUS

## 2017-05-13 NOTE — ED Triage Notes (Signed)
Per EMS:  Pt has been drinking. Pt has a hx of ETOH use. Pt was found passed out on his brothers porch. VSS. Pt reports to wanting help with the EMS.

## 2017-05-13 NOTE — ED Provider Notes (Addendum)
[facility]-EMERGENCY DEPT Provider Note   CSN: [id number] Arrival date & time: 05/13/17  1928     History   Chief Complaint Chief Complaint  Patient presents with  . Alcohol Intoxication    HPI Brett Molina is a 26 y.o. male.  The history is provided by the patient. No language interpreter was used.  Alcohol Intoxication  This is a new problem. The current episode started 12 to 24 hours ago. The problem occurs constantly. The problem has not changed since onset.Pertinent negatives include no chest pain and no shortness of breath. Nothing aggravates the symptoms. Nothing relieves the symptoms. He has tried nothing for the symptoms.   Pt thinks he drank to much.  Pt reports he has been drinking large amounts recently.   Pt feels like he needs inpatient treatment.   Past Medical History:  Diagnosis Date  . ETOH abuse   . Hypertension     Patient Active Problem List   Diagnosis Date Noted  . Alcohol use disorder 02/18/2017  . Alcohol withdrawal (HCC) 02/16/2017  . Alcoholic intoxication without complication (HCC) 12/12/2016  . Hypokalemia 12/12/2016  . Alcoholic hepatitis without ascites 12/12/2016  . MDD (major depressive disorder) 12/12/2016    No past surgical history on file.     Home Medications    Prior to Admission medications   Medication Sig Start Date End Date Taking? Authorizing Provider  amLODipine (NORVASC) 10 MG tablet Take 1 tablet (10 mg total) by mouth daily. Patient not taking: Reported on 02/16/2017 12/27/16   Glade Lloyd, MD  chlordiazePOXIDE (LIBRIUM) 25 MG capsule 50mg  PO TID x 1D, then 25-50mg  PO BID X 1D, then 25-50mg  PO QD X 1D 03/29/17   Robinson, [place] N, PA-C  folic acid (FOLVITE) 1 MG tablet Take 1 tablet (1 mg total) by mouth daily. Patient not taking: Reported on 02/16/2017 12/27/16   Glade Lloyd, MD  gabapentin (NEURONTIN) 300 MG capsule Take 1 capsule (300 mg total) by mouth 3 (three) times daily. Patient not  taking: Reported on 03/28/2017 02/18/17   Standley Brooking, MD  hydrALAZINE (APRESOLINE) 50 MG tablet Take 1 tablet (50 mg total) by mouth every 8 (eight) hours. Patient not taking: Reported on 02/16/2017 12/27/16   Glade Lloyd, MD  labetalol (NORMODYNE) 300 MG tablet Take 1 tablet (300 mg total) by mouth 2 (two) times daily. Patient not taking: Reported on 02/16/2017 12/27/16   Glade Lloyd, MD  Multiple Vitamin (MULTIVITAMIN WITH MINERALS) TABS tablet Take 1 tablet by mouth daily. Patient not taking: Reported on 02/16/2017 12/27/16   Glade Lloyd, MD  thiamine 100 MG tablet Take 1 tablet (100 mg total) by mouth daily. Patient not taking: Reported on 02/16/2017 12/27/16   Glade Lloyd, MD    Family History No family history on file.  Social History Social History   Tobacco Use  . Smoking status: Never Smoker  . Smokeless tobacco: Never Used  Substance Use Topics  . Alcohol use: Yes    Alcohol/week: 8.4 oz    Types: 14 Standard drinks or equivalent per week    Comment: Patient drank vodka yesterday  . Drug use: No     Allergies   Patient has no known allergies.   Review of Systems Review of Systems  Respiratory: Negative for shortness of breath.   Cardiovascular: Negative for chest pain.  All other systems reviewed and are negative.    Physical Exam Updated Vital Signs BP (!) 134/92   Pulse 82  Temp 97.6 F (36.4 C) (Oral)   Resp 16   SpO2 100%   Physical Exam  Constitutional: He appears well-developed and well-nourished.  HENT:  Head: Normocephalic and atraumatic.  Right Ear: External ear normal.  Left Ear: External ear normal.  Nose: Nose normal.  Mouth/Throat: Oropharynx is clear and moist.  Eyes: Conjunctivae are normal.  Neck: Neck supple.  Cardiovascular: Normal rate and regular rhythm.  No murmur heard. Pulmonary/Chest: Effort normal and breath sounds normal. No respiratory distress.  Abdominal: Soft. There is no tenderness.  Musculoskeletal:  He exhibits no edema.  Neurological: He is alert.  Skin: Skin is warm and dry.  Psychiatric: He has a normal mood and affect.  Nursing note and vitals reviewed.    ED Treatments / Results  Labs (all labs ordered are listed, but only abnormal results are displayed) Labs Reviewed  COMPREHENSIVE METABOLIC PANEL - Abnormal; Notable for the following components:      Result Value   Sodium 147 (*)    All other components within normal limits  ETHANOL - Abnormal; Notable for the following components:   Alcohol, Ethyl (B) 449 (*)    All other components within normal limits  ACETAMINOPHEN LEVEL - Abnormal; Notable for the following components:   Acetaminophen (Tylenol), Serum <10 (*)    All other components within normal limits  SALICYLATE LEVEL  CBC  RAPID URINE DRUG SCREEN, HOSP PERFORMED    EKG  EKG Interpretation None       Radiology No results found.  Procedures Procedures (including critical care time)  Medications Ordered in ED Medications  sodium chloride 0.9 % bolus 1,000 mL (1,000 mLs Intravenous New Bag/Given 05/13/17 2029)    Followed by  sodium chloride 0.9 % bolus 1,000 mL (0 mLs Intravenous Hold 05/13/17 2044)    Followed by  0.9 %  sodium chloride infusion (1,000 mLs Intravenous New Bag/Given 05/13/17 2030)     Initial Impression / Assessment and Plan / ED Course  I have reviewed the triage vital signs and the nursing notes.  Pertinent labs & imaging results that were available during my care of the patient were reviewed by me and considered in my medical decision making (see chart for details).     Pt given Iv fluids.  Pt has etoh of 449.   Pt will be observed until clinically sober.  Plan to address treatment options when pt is more sober.  Pt's care turned over to Dr. Preston FleetingGlick. At 1:00 am  Final Clinical Impressions(s) / ED Diagnoses   Final diagnoses:  Alcoholic intoxication without complication [facility](HCC)    ED Discharge Orders    None         Osie CheeksSofia, Sindia Kowalczyk K, PA-C 05/13/17 2151    Gerhard MunchLockwood, Robert, MD 05/13/17 2331    Elson AreasSofia, Brandye Inthavong K, PA-C 05/14/17 [id number]    Gerhard MunchLockwood, Robert, MD 05/15/17 0020

## 2017-05-14 NOTE — ED Notes (Signed)
Pt. Ambulated to bathroom twice without distress.

## 2017-05-14 NOTE — ED Notes (Signed)
Patient provided sprite x2

## 2017-05-14 NOTE — ED Provider Notes (Signed)
Patient signed out to me with severe alcohol intoxication, ethanol level 449.  He was sleeping comfortably in the ED.  He got up and was able to ambulate without assistance.  He was stable while ambulating.  He is felt to be safe for discharge.  Of note, he does have a prior ED visit with similar ethanol level.   Dione Booze, MD 05/14/17 0530

## 2018-06-22 ENCOUNTER — Emergency Department (HOSPITAL_COMMUNITY)
Admission: EM | Admit: 2018-06-22 | Discharge: 2018-06-22 | Disposition: A | Discharge status: Home / Self Care | Payer: Managed Care, Other (non HMO) | Attending: Emergency Medicine | Admitting: Emergency Medicine

## 2018-06-22 DIAGNOSIS — F1029 Alcohol dependence with unspecified alcohol-induced disorder: Secondary | ICD-10-CM | POA: Insufficient documentation

## 2018-06-22 DIAGNOSIS — I10 Essential (primary) hypertension: Secondary | ICD-10-CM | POA: Insufficient documentation

## 2018-06-22 MED ORDER — CHLORDIAZEPOXIDE HCL 25 MG PO CAPS: ORAL_CAPSULE | ORAL | 0 refills | Status: DC

## 2018-06-22 NOTE — Discharge Instructions (Addendum)
You were evaluated in the Emergency Department and after careful evaluation, we did not find any emergent condition requiring admission or further testing in the hospital.  Your symptoms today seem to be due to alcohol dependence.  We are providing you with a prescription for Librium to replace your alcohol, with a tapered schedule to allow you to safely avoid withdrawal.  Please follow the schedule as written.  Please return to the Emergency Department if you experience any worsening of your condition.  We encourage you to follow up with a primary care provider.  Thank you for allowing [place] to be a part of your care.

## 2018-06-22 NOTE — ED Notes (Signed)
Pt reports it is the 2 year anniversary of his mother's death. He denies SI or HI but does acknowledge he is depressed. Discussed grief resources with him. GF at bedside supportive.

## 2018-06-22 NOTE — ED Provider Notes (Signed)
[facility] Emergency Department Provider Note MRN:  [id number]  Arrival date & time: 06/22/18     Chief Complaint   Alcohol Problem   History of Present Illness   Brett Molina is a 27 y.o. year-old male with a history of alcohol abuse presenting to the ED with chief complaint of alcohol problem.  Patient has suffered from alcohol addiction for several years.  Has been on and off with his drinking habits.  Has been binge drinking for the past 4 days.  Heavy drinking triggered by recent mild depression related to the 2-year anniversary of his mother's death.  Patient denies other drug abuse, denies suicidal or homicidal ideation, no audiovisual hallucination.  Here because he is afraid of the withdrawal process, which he has experienced in the past.  This morning woke up with some tremors, had to have a drink this morning to stop them.  No other significant symptoms, no fever, no chest pain, no shortness of breath, no abdominal pain.  Review of Systems  A complete 10 system review of systems was obtained and all systems are negative except as noted in the HPI and PMH.   Patient's Health History    Past Medical History:  Diagnosis Date  . ETOH abuse   . Hypertension     No past surgical history on file.  No family history on file.  Social History   Socioeconomic History  . Marital status: Single    Spouse name: Not on file  . Number of children: Not on file  . Years of education: Not on file  . Highest education level: Not on file  Occupational History  . Not on file  Social Needs  . Financial resource strain: Not on file  . Food insecurity:    Worry: Not on file    Inability: Not on file  . Transportation needs:    Medical: Not on file    Non-medical: Not on file  Tobacco Use  . Smoking status: Never Smoker  . Smokeless tobacco: Never Used  Substance and Sexual Activity  . Alcohol use: Yes    Alcohol/week: 14.0 standard drinks    Types: 14  Standard drinks or equivalent per week    Comment: Patient drank vodka yesterday  . Drug use: No  . Sexual activity: Not on file  Lifestyle  . Physical activity:    Days per week: Not on file    Minutes per session: Not on file  . Stress: Not on file  Relationships  . Social connections:    Talks on phone: Not on file    Gets together: Not on file    Attends religious service: Not on file    Active member of club or organization: Not on file    Attends meetings of clubs or organizations: Not on file    Relationship status: Not on file  . Intimate partner violence:    Fear of current or ex partner: Not on file    Emotionally abused: Not on file    Physically abused: Not on file    Forced sexual activity: Not on file  Other Topics Concern  . Not on file  Social History Narrative  . Not on file     Physical Exam  Vital Signs and Nursing Notes reviewed Vitals:   06/22/18 0718 06/22/18 0720  BP: (!) 180/106   Pulse:  (!) 113  Resp:    Temp:    SpO2:  98%  CONSTITUTIONAL: Well-appearing, NAD NEURO:  Alert and oriented x 3, no focal deficits EYES:  eyes equal and reactive ENT/NECK:  no LAD, no JVD CARDIO: Regular rate, well-perfused, normal S1 and S2 PULM:  CTAB no wheezing or rhonchi GI/GU:  normal bowel sounds, non-distended, non-tender MSK/SPINE:  No gross deformities, no edema SKIN:  no rash, atraumatic PSYCH:  Appropriate speech and behavior  Diagnostic and Interventional Summary    Labs Reviewed - No data to display  No orders to display    Medications - No data to display   Procedures Critical Care  ED Course and Medical Decision Making  I have reviewed the triage vital signs and the nursing notes.  Pertinent labs & imaging results that were available during my care of the patient were reviewed by me and considered in my medical decision making (see below for details).  27 year old male here to receive assistance with alcohol withdrawal.  Seems  reliable, seems determined to stop alcohol.  Mildly tachycardic in triage, resolved upon my evaluation.  No evidence of withdrawal currently, last drink 20 minutes ago.  No weapons in the home, not a risk to himself or others, has never abused pills in the past.  Good candidate for Librium taper.  Stressed the importance of not mixing alcohol with Librium.  After the discussed management above, the patient was determined to be safe for discharge.  The patient was in agreement with this plan and all questions regarding their care were answered.  ED return precautions were discussed and the patient will return to the ED with any significant worsening of condition.   Elmer Sow. Pilar Plate, MD [facility] Emergency Medicine [facility] [email or web address]  Final Clinical Impressions(s) / ED Diagnoses     ICD-10-CM   1. Alcohol dependence with unspecified alcohol-induced disorder [facility]) F10.29     ED Discharge Orders         Ordered    chlordiazePOXIDE (LIBRIUM) 25 MG capsule  Status:  Discontinued     06/22/18 0734    chlordiazePOXIDE (LIBRIUM) 25 MG capsule     06/22/18 0737             Sabas Sous, MD 06/22/18 [id number]

## 2018-06-22 NOTE — ED Triage Notes (Signed)
Pt states he is here for "alcohol withdrawal" Pt states his last drink was approx 20 min PTA, he has been on a drinking binge since Sunday.

## 2018-07-23 ENCOUNTER — Other Ambulatory Visit: Payer: Self-pay

## 2018-07-23 ENCOUNTER — Observation Stay (HOSPITAL_BASED_OUTPATIENT_CLINIC_OR_DEPARTMENT_OTHER)
Admission: EM | Admit: 2018-07-23 | Discharge: 2018-07-25 | Disposition: A | Discharge status: Home / Self Care | Payer: Self-pay | Attending: Internal Medicine | Admitting: Internal Medicine

## 2018-07-23 ENCOUNTER — Inpatient Hospital Stay (HOSPITAL_BASED_OUTPATIENT_CLINIC_OR_DEPARTMENT_OTHER): Payer: Managed Care, Other (non HMO)

## 2018-07-23 ENCOUNTER — Encounter (HOSPITAL_BASED_OUTPATIENT_CLINIC_OR_DEPARTMENT_OTHER): Payer: Self-pay | Admitting: *Deleted

## 2018-07-23 DIAGNOSIS — F329 Major depressive disorder, single episode, unspecified: Secondary | ICD-10-CM | POA: Insufficient documentation

## 2018-07-23 DIAGNOSIS — F10239 Alcohol dependence with withdrawal, unspecified: Secondary | ICD-10-CM | POA: Insufficient documentation

## 2018-07-23 DIAGNOSIS — F1010 Alcohol abuse, uncomplicated: Secondary | ICD-10-CM

## 2018-07-23 DIAGNOSIS — R112 Nausea with vomiting, unspecified: Secondary | ICD-10-CM

## 2018-07-23 DIAGNOSIS — Z79899 Other long term (current) drug therapy: Secondary | ICD-10-CM | POA: Insufficient documentation

## 2018-07-23 DIAGNOSIS — K2100 Gastro-esophageal reflux disease with esophagitis, without bleeding: Secondary | ICD-10-CM

## 2018-07-23 DIAGNOSIS — K920 Hematemesis: Secondary | ICD-10-CM | POA: Insufficient documentation

## 2018-07-23 DIAGNOSIS — E860 Dehydration: Secondary | ICD-10-CM | POA: Insufficient documentation

## 2018-07-23 DIAGNOSIS — K922 Gastrointestinal hemorrhage, unspecified: Secondary | ICD-10-CM | POA: Diagnosis present

## 2018-07-23 DIAGNOSIS — K7010 Alcoholic hepatitis without ascites: Secondary | ICD-10-CM | POA: Insufficient documentation

## 2018-07-23 DIAGNOSIS — E876 Hypokalemia: Secondary | ICD-10-CM | POA: Insufficient documentation

## 2018-07-23 DIAGNOSIS — D649 Anemia, unspecified: Secondary | ICD-10-CM | POA: Insufficient documentation

## 2018-07-23 DIAGNOSIS — K760 Fatty (change of) liver, not elsewhere classified: Secondary | ICD-10-CM | POA: Insufficient documentation

## 2018-07-23 DIAGNOSIS — K921 Melena: Secondary | ICD-10-CM | POA: Insufficient documentation

## 2018-07-23 DIAGNOSIS — J90 Pleural effusion, not elsewhere classified: Secondary | ICD-10-CM | POA: Insufficient documentation

## 2018-07-23 DIAGNOSIS — R000 Tachycardia, unspecified: Secondary | ICD-10-CM | POA: Insufficient documentation

## 2018-07-23 DIAGNOSIS — F10229 Alcohol dependence with intoxication, unspecified: Secondary | ICD-10-CM | POA: Insufficient documentation

## 2018-07-23 DIAGNOSIS — I10 Essential (primary) hypertension: Secondary | ICD-10-CM | POA: Insufficient documentation

## 2018-07-23 DIAGNOSIS — Y908 Blood alcohol level of 240 mg/100 ml or more: Secondary | ICD-10-CM | POA: Insufficient documentation

## 2018-07-23 DIAGNOSIS — Z23 Encounter for immunization: Secondary | ICD-10-CM | POA: Insufficient documentation

## 2018-07-23 LAB — CBC WITH DIFFERENTIAL/PLATELET
Abs Immature Granulocytes: 0.05 10*3/uL (ref 0.00–0.07)
Basophils Absolute: 0 10*3/uL (ref 0.0–0.1)
Basophils Relative: 0 %
Eosinophils Absolute: 0 10*3/uL (ref 0.0–0.5)
Eosinophils Relative: 0 %
HCT: 44.2 % (ref 39.0–52.0)
Hemoglobin: 15.1 g/dL (ref 13.0–17.0)
Immature Granulocytes: 1 %
Lymphocytes Relative: 14 %
Lymphs Abs: 1.4 10*3/uL (ref 0.7–4.0)
MCH: 32.1 pg (ref 26.0–34.0)
MCHC: 34.2 g/dL (ref 30.0–36.0)
MCV: 94 fL (ref 80.0–100.0)
Monocytes Absolute: 0.9 10*3/uL (ref 0.1–1.0)
Monocytes Relative: 9 %
NEUTROS PCT: 76 %
Neutro Abs: 7.4 10*3/uL (ref 1.7–7.7)
Platelets: 232 10*3/uL (ref 150–400)
RBC: 4.7 MIL/uL (ref 4.22–5.81)
RDW: 13.3 % (ref 11.5–15.5)
WBC: 9.7 10*3/uL (ref 4.0–10.5)
nRBC: 0 % (ref 0.0–0.2)

## 2018-07-23 LAB — URINALYSIS, MICROSCOPIC (REFLEX)

## 2018-07-23 LAB — LIPASE, BLOOD: LIPASE: 25 U/L (ref 11–51)

## 2018-07-23 LAB — URINALYSIS, ROUTINE W REFLEX MICROSCOPIC
Bilirubin Urine: NEGATIVE
GLUCOSE, UA: NEGATIVE mg/dL
KETONES UR: 40 mg/dL — AB
Leukocytes, UA: NEGATIVE
Nitrite: NEGATIVE
Protein, ur: >300 mg/dL — AB
Specific Gravity, Urine: 1.025 (ref 1.005–1.030)
pH: 6 (ref 5.0–8.0)

## 2018-07-23 LAB — COMPREHENSIVE METABOLIC PANEL
ALT: 40 U/L (ref 0–44)
AST: 46 U/L — AB (ref 15–41)
Albumin: 4.5 g/dL (ref 3.5–5.0)
Alkaline Phosphatase: 46 U/L (ref 38–126)
Anion gap: 18 — ABNORMAL HIGH (ref 5–15)
BUN: 8 mg/dL (ref 6–20)
CHLORIDE: 93 mmol/L — AB (ref 98–111)
CO2: 22 mmol/L (ref 22–32)
Calcium: 8.5 mg/dL — ABNORMAL LOW (ref 8.9–10.3)
Creatinine, Ser: 0.86 mg/dL (ref 0.61–1.24)
GFR calc Af Amer: >60 mL/min (ref >60)
Glucose, Bld: 111 mg/dL — ABNORMAL HIGH (ref 70–99)
POTASSIUM: 3.7 mmol/L (ref 3.5–5.1)
Sodium: 133 mmol/L — ABNORMAL LOW (ref 135–145)
Total Bilirubin: 0.7 mg/dL (ref 0.3–1.2)
Total Protein: 7.5 g/dL (ref 6.5–8.1)

## 2018-07-23 LAB — PROTIME-INR
INR: 0.99
Prothrombin Time: 13 seconds (ref 11.4–15.2)

## 2018-07-23 LAB — HEMOGLOBIN AND HEMATOCRIT, BLOOD
HCT: 39.4 % (ref 39.0–52.0)
Hemoglobin: 13.2 g/dL (ref 13.0–17.0)

## 2018-07-23 LAB — ETHANOL: Alcohol, Ethyl (B): 331 mg/dL (ref <10)

## 2018-07-23 LAB — MRSA PCR SCREENING: MRSA by PCR: NEGATIVE

## 2018-07-23 LAB — OCCULT BLOOD X 1 CARD TO LAB, STOOL: Fecal Occult Bld: POSITIVE — AB

## 2018-07-23 MED ORDER — LORAZEPAM 1 MG PO TABS: 0.0000 mg | ORAL_TABLET | Freq: Two times a day (BID) | ORAL | Status: DC

## 2018-07-23 MED ORDER — METOCLOPRAMIDE HCL 5 MG/ML IJ SOLN
10.0000 mg | Freq: Once | INTRAMUSCULAR | Status: AC
  Administered 2018-07-23: 10 mg via INTRAVENOUS
  Filled 2018-07-23: qty 2

## 2018-07-23 MED ORDER — LORAZEPAM 2 MG/ML IJ SOLN: 0.0000 mg | Freq: Two times a day (BID) | INTRAMUSCULAR | Status: DC

## 2018-07-23 MED ORDER — LORAZEPAM 2 MG/ML IJ SOLN
0.0000 mg | Freq: Four times a day (QID) | INTRAMUSCULAR | Status: AC
  Administered 2018-07-23: 2 mg via INTRAVENOUS
  Administered 2018-07-23 – 2018-07-24 (×4): 1 mg via INTRAVENOUS
  Filled 2018-07-23 (×5): qty 1

## 2018-07-23 MED ORDER — LACTATED RINGERS IV BOLUS
1000.0000 mL | Freq: Once | INTRAVENOUS | Status: AC
  Administered 2018-07-23: 1000 mL via INTRAVENOUS

## 2018-07-23 MED ORDER — SODIUM CHLORIDE 0.9 % IV SOLN
8.0000 mg/h | INTRAVENOUS | Status: DC
  Administered 2018-07-23 – 2018-07-24 (×2): 8 mg/h via INTRAVENOUS
  Filled 2018-07-23 (×4): qty 80

## 2018-07-23 MED ORDER — PANTOPRAZOLE SODIUM 40 MG IV SOLR
INTRAVENOUS | Status: AC
  Filled 2018-07-23: qty 80

## 2018-07-23 MED ORDER — SODIUM CHLORIDE 0.9 % IV BOLUS
1000.0000 mL | Freq: Once | INTRAVENOUS | Status: AC
  Administered 2018-07-23: 1000 mL via INTRAVENOUS

## 2018-07-23 MED ORDER — PNEUMOCOCCAL VAC POLYVALENT 25 MCG/0.5ML IJ INJ
0.5000 mL | INJECTION | INTRAMUSCULAR | Status: DC
  Filled 2018-07-23: qty 0.5

## 2018-07-23 MED ORDER — INFLUENZA VAC SPLIT QUAD 0.5 ML IM SUSY: 0.5000 mL | PREFILLED_SYRINGE | INTRAMUSCULAR | Status: DC

## 2018-07-23 MED ORDER — ONDANSETRON HCL 4 MG/2ML IJ SOLN
4.0000 mg | Freq: Four times a day (QID) | INTRAMUSCULAR | Status: DC | PRN
  Administered 2018-07-24 (×2): 4 mg via INTRAVENOUS
  Filled 2018-07-23 (×2): qty 2

## 2018-07-23 MED ORDER — THIAMINE HCL 100 MG/ML IJ SOLN
100.0000 mg | Freq: Every day | INTRAMUSCULAR | Status: DC
  Administered 2018-07-23 – 2018-07-24 (×2): 100 mg via INTRAVENOUS
  Filled 2018-07-23 (×2): qty 2

## 2018-07-23 MED ORDER — VITAMIN B-1 100 MG PO TABS
100.0000 mg | ORAL_TABLET | Freq: Every day | ORAL | Status: DC
  Administered 2018-07-25: 100 mg via ORAL
  Filled 2018-07-23: qty 1

## 2018-07-23 MED ORDER — SODIUM CHLORIDE 0.9 % IV SOLN
INTRAVENOUS | Status: AC
  Administered 2018-07-23: 19:00:00 via INTRAVENOUS

## 2018-07-23 MED ORDER — PANTOPRAZOLE SODIUM 40 MG IV SOLR
40.0000 mg | Freq: Once | INTRAVENOUS | Status: AC
  Administered 2018-07-23: 40 mg via INTRAVENOUS
  Filled 2018-07-23: qty 40

## 2018-07-23 MED ORDER — SODIUM CHLORIDE 0.9% FLUSH
3.0000 mL | Freq: Two times a day (BID) | INTRAVENOUS | Status: DC
  Administered 2018-07-23 – 2018-07-25 (×4): 3 mL via INTRAVENOUS

## 2018-07-23 MED ORDER — LORAZEPAM 1 MG PO TABS: 0.0000 mg | ORAL_TABLET | Freq: Four times a day (QID) | ORAL | Status: AC

## 2018-07-23 MED ORDER — ONDANSETRON HCL 4 MG/2ML IJ SOLN
4.0000 mg | Freq: Once | INTRAMUSCULAR | Status: AC
  Administered 2018-07-23: 4 mg via INTRAVENOUS
  Filled 2018-07-23: qty 2

## 2018-07-23 MED ORDER — IOPAMIDOL (ISOVUE-300) INJECTION 61%
100.0000 mL | Freq: Once | INTRAVENOUS | Status: AC | PRN
  Administered 2018-07-23: 100 mL via INTRAVENOUS

## 2018-07-23 NOTE — H&P (Signed)
History and Physical   Brett Molina JSH:[id number] DOB: 1991/05/10 DOA: 07/23/2018  PCP: Patient, No Pcp Per  Chief Complaint: nausea and vomiting  History is obtained via patient and male companion report, in addition to chart review.  HPI: This is a 28 year old man with medical problems including hepatic steatosis and alcohol misuse, and hypertension presenting with nausea and vomiting.  Patient reports onset of symptoms 4 days ago.  Has been having inability to keep down any p.o. food or drink per his report.  Emesis has been constant, at times having an appearance of coffee grounds.  He denies melena, maroon stool, hematochezia.  He does report a 10 pound unintentional weight loss over the past 3 months, decreased appetite, subjective fevers, and diaphoresis.  He drinks alcohol per his report 3-4 times weekly, he reports drinking 10 ounces of gin on those occasions.  Last drink was July 23, 2018 at 1 PM.  He reports attempting to self medicate with alcohol.  He denies nonsteroidal anti-inflammatory medication use, denies the symptoms ever occurring before.  No lightheadedness or dizziness, or syncope.  He reports having 1 bowel movement a day, dark in color, no overt melena.  Has never had an EGD.  At baseline, he works as an Airline pilot, does not smoke, resides locally in [facility].  ED Course: Prior to arrival on the stepdown unit, vital signs remarkable for tachycardia with a rate of 133, systolic blood pressure above 850, diagnostics including CBC with hemoglobin of 15.1 with a repeat hemoglobin of 13.2.  BMP with BUN and creatinine of 8 and 0.86 respectively.  Alcohol detectable at 331 milligrams per deciliter.  PT/INR normal.  Lipase within normal months.  Fecal occult positive.  CT of the abdomen pelvis was performed which revealed evidence of hepatic steatosis as well as distal esophageal thickening suggestive of esophagitis.  Also was seen moderate layering  right pleural effusion.  Patient was given IV PPI followed by a Protonix drip, octreotide and Reglan.  He was given 2 L normal saline and accepted to the stepdown unit at [facility] for further management.  Review of Systems: A complete ROS was obtained; pertinent positives negatives are denoted in the HPI. Otherwise, all systems are negative.   Past Medical History:  Diagnosis Date  . ETOH abuse   . Hypertension    Social History   Socioeconomic History  . Marital status: Single    Spouse name: Not on file  . Number of children: Not on file  . Years of education: Not on file  . Highest education level: Not on file  Occupational History  . Not on file  Social Needs  . Financial resource strain: Not on file  . Food insecurity:    Worry: Not on file    Inability: Not on file  . Transportation needs:    Medical: Not on file    Non-medical: Not on file  Tobacco Use  . Smoking status: Never Smoker  . Smokeless tobacco: Never Used  Substance and Sexual Activity  . Alcohol use: Yes    Alcohol/week: 14.0 standard drinks    Types: 14 Standard drinks or equivalent per week    Comment: Patient drank vodka yesterday  . Drug use: No  . Sexual activity: Not on file  Lifestyle  . Physical activity:    Days per week: Not on file    Minutes per session: Not on file  . Stress: Not on file  Relationships  . Social  connections:    Talks on phone: Not on file    Gets together: Not on file    Attends religious service: Not on file    Active member of club or organization: Not on file    Attends meetings of clubs or organizations: Not on file    Relationship status: Not on file  . Intimate partner violence:    Fear of current or ex partner: Not on file    Emotionally abused: Not on file    Physically abused: Not on file    Forced sexual activity: Not on file  Other Topics Concern  . Not on file  Social History Narrative  . Not on file   Family hx: Mother is deceased  died of the flu.  Physical Exam: Vitals:   07/23/18 1930 07/23/18 2000 07/23/18 2030 07/23/18 2200  BP: (!) 119/56 (!) 119/56 131/72 (!) 156/57  Pulse: (!) 107 (!) 126 (!) 132 (!) 117  Resp: (!) 26 (!) 23 (!) 22 (!) 23  Temp:      TempSrc:      SpO2: 99% 96% 100% 96%  Weight:      Height:       General: Appears calm and comfortable, black young man. ENT: Grossly normal hearing, dry mucous membranes, halitosis present Cardiovascular: Tachycardic, S1 and S2 are unremarkable with a regular rhythm. No M/R/G. No LE edema.  Respiratory: CTA bilaterally.  Breathing room air.  No focal wheezing. Abdomen: Soft, non-tender.  Nondistended. Skin: No rash or induration seen on limited exam. Musculoskeletal: Grossly normal tone BUE/BLE. Appropriate ROM.  Psychiatric: Grossly normal mood and affect. Neurologic: Moves all extremities in coordinated fashion.  Alert and answering questions appropriately.  I have personally reviewed the following labs, culture data, and imaging studies.  Assessment/Plan:  #Upper GI bleed, concern for Presenting with ~4 days of nausea, vomiting, and self-reported coffee ground emesis.  Hb trend: 15.1 -> 13.2. CT evidence supportive of esophagitis. Was given IV PPI bolus followed by a gtt; as well as 2 L of NS. A/P: ongoing tachycardia may be multifactorial including - dehydration, GIB related, ETOH withdrawal; no active GI hemorrhage evident at this moment (emesis in bedside collection device not coffee grounds currently); will provide additional L of isotonic fluid with a continued rate of NS at 150 cc through this AM and then re-assess. Continue PPI gtt.  NPO. Type and screen. Conult GI team in the AM for consideration of diagnostic EGD.  Suspect ETOH related gastritis / esophagitis as cause.  Can consider H pylori testing.  #Other problems: -ETOH use : will continue with CIWA protocol, ETOH detected in ED; patient and male companion want resources to help with  complete cessation of ETOH -Dehydration: suspected on the basis of prior 4 days of n/v, providing hydration as outlined above -Elevated AG: suspect related to ETOH use, obtaining lactic acid to evaluate for lactic acidosis -Hepatic steatosis: suspected based on CT scan, recommended complete cessation of ETOH  DVT prophylaxis: SCD given concern for GI bleeding Code Status: full code Disposition Plan: Anticipate D/C home potentially within 24-48 hrs depending on clinical course Consults called: none, anticipate GI consult in the AM Admission status: admit to step down unit, hospital medicine service   Laurell Roof, MD [facility] [phone]  If 7PM-7AM, please contact night-coverage [email or web address] Password [email or web address]  This document was created using the aid of voice recognition / dication software.

## 2018-07-23 NOTE — ED Notes (Signed)
ED Provider at bedside. 

## 2018-07-23 NOTE — ED Notes (Signed)
[facility] notified (Tammy) - patient ready for transport 

## 2018-07-23 NOTE — Treatment Plan (Signed)
28 yo with hx etoh abuse presenting with nausea/vomiting and dark colored vomit and stool.  Concern for dark/coffee ground emesis.  EDP exam with brown stool with black particles.  Labs notable for Hb 15.1 and elevated etoh level.  He did have heme positive stool.  Per discussion with EDP, no abdominal pain on exam.  Requested lipase and CT abdomen pelvis as well as protonix gtt.  Hx of hepatic steatosis on imaging in 2018, but no lab abnormalities c/w cirrhosis.  He's tachycardic and this could be related to withdrawal.  Receiving additional fluids per EDP. Will accept pt to stepdown bed.  GI will need to be notified when pt arrives.

## 2018-07-23 NOTE — ED Provider Notes (Signed)
[facility] EMERGENCY DEPARTMENT Provider Note   CSN: [id number] Arrival date & time: 07/23/18  1352     History   Chief Complaint Chief Complaint  Patient presents with  . Nausea  . Emesis    HPI Brett Molina is a 28 y.o. male.  Patient with history of alcohol abuse presents to the emergency department with complaint of vomiting ongoing for 3 to 4 days.  Patient states that he has noticed dark-colored vomit and stool.  He denies overt diarrhea.  He states that he has not been able to keep down liquids or solids however has been trying to drink alcohol.  When asked if he feels like he is going through withdrawals, he states somewhat.  He reports objective fevers but no documented fevers.  No significant cough.  No abdominal pain or chest pain.  No urinary symptoms.  No skin rashes.  Denies sick contacts.  500 cc of normal saline given in route by EMS.  Onset of symptoms acute.  Course is constant.  No blood thinners.     Past Medical History:  Diagnosis Date  . ETOH abuse   . Hypertension     Patient Active Problem List   Diagnosis Date Noted  . Alcohol use disorder 02/18/2017  . Alcohol withdrawal (HCC) 02/16/2017  . Alcoholic intoxication without complication (HCC) 12/12/2016  . Hypokalemia 12/12/2016  . Alcoholic hepatitis without ascites 12/12/2016  . MDD (major depressive disorder) 12/12/2016    No past surgical history on file.      Home Medications    Prior to Admission medications   Medication Sig Start Date End Date Taking? Authorizing Provider  amLODipine (NORVASC) 10 MG tablet Take 1 tablet (10 mg total) by mouth daily. Patient not taking: Reported on 02/16/2017 12/27/16   Glade Lloyd, MD  chlordiazePOXIDE (LIBRIUM) 25 MG capsule 50mg  PO TID x 1D, then 25-50mg  PO BID X 1D, then 25-50mg  PO QD X 1D 03/29/17   Robinson, [place] N, PA-C  chlordiazePOXIDE (LIBRIUM) 25 MG capsule Day 1: Take 2 tablets every 8 hours Day 2: Take 1 tablet every 6  hours Day 3: Take 1 tablet twice daily Day 4: Take 1 tablet at night 06/22/18   Sabas Sous, MD  folic acid (FOLVITE) 1 MG tablet Take 1 tablet (1 mg total) by mouth daily. Patient not taking: Reported on 02/16/2017 12/27/16   Glade Lloyd, MD  gabapentin (NEURONTIN) 300 MG capsule Take 1 capsule (300 mg total) by mouth 3 (three) times daily. Patient not taking: Reported on 03/28/2017 02/18/17   Standley Brooking, MD  hydrALAZINE (APRESOLINE) 50 MG tablet Take 1 tablet (50 mg total) by mouth every 8 (eight) hours. Patient not taking: Reported on 02/16/2017 12/27/16   Glade Lloyd, MD  labetalol (NORMODYNE) 300 MG tablet Take 1 tablet (300 mg total) by mouth 2 (two) times daily. Patient not taking: Reported on 02/16/2017 12/27/16   Glade Lloyd, MD  Multiple Vitamin (MULTIVITAMIN WITH MINERALS) TABS tablet Take 1 tablet by mouth daily. Patient not taking: Reported on 02/16/2017 12/27/16   Glade Lloyd, MD  thiamine 100 MG tablet Take 1 tablet (100 mg total) by mouth daily. Patient not taking: Reported on 02/16/2017 12/27/16   Glade Lloyd, MD    Family History No family history on file.  Social History Social History   Tobacco Use  . Smoking status: Never Smoker  . Smokeless tobacco: Never Used  Substance Use Topics  . Alcohol use: Yes  Alcohol/week: 14.0 standard drinks    Types: 14 Standard drinks or equivalent per week    Comment: Patient drank vodka yesterday  . Drug use: No     Allergies   Patient has no known allergies.   Review of Systems Review of Systems  Constitutional: Positive for fever (subjective).  HENT: Negative for rhinorrhea and sore throat.   Eyes: Negative for redness.  Respiratory: Negative for cough and shortness of breath.   Cardiovascular: Negative for chest pain.  Gastrointestinal: Positive for blood in stool (dark stool), nausea and vomiting. Negative for abdominal pain and diarrhea.  Genitourinary: Negative for dysuria.  Musculoskeletal:  Negative for myalgias.  Skin: Negative for rash.  Neurological: Negative for headaches.     Physical Exam Updated Vital Signs BP (!) 157/109   Pulse (!) 127   Temp 99.4 F (37.4 C) (Oral)   Ht 6\' 2"  (1.88 m)   Wt 104.3 kg   SpO2 95%   BMI 29.53 kg/m   Physical Exam Vitals signs and nursing note reviewed.  Constitutional:      Appearance: He is well-developed.  HENT:     Head: Normocephalic and atraumatic.     Mouth/Throat:     Mouth: Mucous membranes are dry.  Eyes:     General:        Right eye: No discharge.        Left eye: No discharge.     Conjunctiva/sclera: Conjunctivae normal.  Neck:     Musculoskeletal: Normal range of motion and neck supple.  Cardiovascular:     Rate and Rhythm: Regular rhythm. Tachycardia present.     Heart sounds: Normal heart sounds.  Pulmonary:     Effort: Pulmonary effort is normal.     Breath sounds: Normal breath sounds.  Abdominal:     Palpations: Abdomen is soft.     Tenderness: There is no abdominal tenderness.     Comments: Heaving in room  Skin:    General: Skin is warm and dry.  Neurological:     Mental Status: He is alert.      ED Treatments / Results  Labs (all labs ordered are listed, but only abnormal results are displayed) Labs Reviewed  COMPREHENSIVE METABOLIC PANEL - Abnormal; Notable for the following components:      Result Value   Sodium 133 (*)    Chloride 93 (*)    Glucose, Bld 111 (*)    Calcium 8.5 (*)    AST 46 (*)    Anion gap 18 (*)    All other components within normal limits  OCCULT BLOOD X 1 CARD TO LAB, STOOL - Abnormal; Notable for the following components:   Fecal Occult Bld POSITIVE (*)    All other components within normal limits  ETHANOL - Abnormal; Notable for the following components:   Alcohol, Ethyl (B) 331 (*)    All other components within normal limits  URINALYSIS, ROUTINE W REFLEX MICROSCOPIC - Abnormal; Notable for the following components:   Color, Urine AMBER (*)    Hgb  urine dipstick MODERATE (*)    Ketones, ur 40 (*)    Protein, ur >300 (*)    All other components within normal limits  URINALYSIS, MICROSCOPIC (REFLEX) - Abnormal; Notable for the following components:   Bacteria, UA FEW (*)    All other components within normal limits  MRSA PCR SCREENING  CBC WITH DIFFERENTIAL/PLATELET  PROTIME-INR  LIPASE, BLOOD  HEMOGLOBIN AND HEMATOCRIT, BLOOD    EKG  EKG Interpretation  Date/Time:  Saturday July 23 2018 14:02:40 EST Ventricular Rate:  130 PR Interval:    QRS Duration: 89 QT Interval:  328 QTC Calculation: 483 R Axis:   76 Text Interpretation:  Sinus tachycardia Borderline T wave abnormalities ST elevation, consider anterior injury Borderline prolonged QT interval Baseline wander in lead(s) II III aVR aVF V3 V4 V5 rate related changes  Confirmed by Richardean CanalYao, David H [phone]([id number]) on 07/23/2018 2:11:18 PM   Radiology Ct Abdomen Pelvis W Contrast  Result Date: 07/23/2018 CLINICAL DATA:  100 mL isovue 300 given, tolerated well without complication or reaction No PO contrast per PA order, not tolerating liquids Pt with n/v, hematemesis, fever Symptoms x 3-4 days, no pain at this time Hx HTN Labs within normal range^15600mL ISOVUE-300 IOPAMIDOL (ISOVUE-300) INJECTION 61%Hematemesis EXAM: CT ABDOMEN AND PELVIS WITH CONTRAST TECHNIQUE: Multidetector CT imaging of the abdomen and pelvis was performed using the standard protocol following bolus administration of intravenous contrast. CONTRAST:  100mL ISOVUE-300 IOPAMIDOL (ISOVUE-300) INJECTION 61% COMPARISON:  None. FINDINGS: Lower chest: Moderate RIGHT effusion layers dependently in the RIGHT hemithorax. Mild RIGHT basilar atelectasis. No pneumonitis or pneumonia. Hepatobiliary: Hypodense regions along the falciform ligament and the gallbladder fossa most consistent fatty infiltration liver. Liver overall is low-attenuation which could indicate hepatic steatosis. Gallbladder normal. Normal common bile duct. Pancreas:  Pancreas is normal. No ductal dilatation. No pancreatic inflammation. Spleen: Normal spleen Adrenals/urinary tract: Adrenal glands and kidneys are normal. The ureters and bladder normal. Stomach/Bowel: There is circumferential mural thickening of the distal esophagus from GE junction superiorly to the upper limits of the scan range (6 cm). Single wall thickness measures 9 mm (image 6/2). The stomach, duodenum, small-bowel appendix normal. The colon and rectosigmoid colon normal. Vascular/Lymphatic: Abdominal aorta is normal caliber. No periportal or retroperitoneal adenopathy. No pelvic adenopathy. Reproductive: Prostate normal Other: No free fluid. Musculoskeletal: No aggressive osseous lesion. IMPRESSION: 1. Long segment of circumferential mucosal thickening of the distal esophagus suggests esophagitis. Recommend clinical correlation and consider upper endoscopy. 2. Moderate layering RIGHT pleural effusion. 3. Potential hepatic steatosis and fatty infiltration along the gallbladder fossa and falciform ligament. Electronically Signed   By: Genevive BiStewart  Edmunds M.D.   On: 07/23/2018 18:30    Procedures Procedures (including critical care time)  Medications Ordered in ED Medications  LORazepam (ATIVAN) injection 0-4 mg (1 mg Intravenous Given 07/23/18 1446)    Or  LORazepam (ATIVAN) tablet 0-4 mg ( Oral See Alternative 07/23/18 1446)  LORazepam (ATIVAN) injection 0-4 mg (has no administration in time range)    Or  LORazepam (ATIVAN) tablet 0-4 mg (has no administration in time range)  thiamine (VITAMIN B-1) tablet 100 mg ( Oral See Alternative 07/23/18 1511)    Or  thiamine (B-1) injection 100 mg (100 mg Intravenous Given 07/23/18 1511)  pantoprazole (PROTONIX) 80 mg in sodium chloride 0.9 % 250 mL (0.32 mg/mL) infusion (8 mg/hr Intravenous New Bag/Given 07/23/18 1747)  0.9 %  sodium chloride infusion (has no administration in time range)  metoCLOPramide (REGLAN) injection 10 mg (has no administration in  time range)  sodium chloride 0.9 % bolus 1,000 mL (0 mLs Intravenous Stopped 07/23/18 1527)  pantoprazole (PROTONIX) injection 40 mg (40 mg Intravenous Given 07/23/18 1517)  ondansetron (ZOFRAN) injection 4 mg (4 mg Intravenous Given 07/23/18 1618)  sodium chloride 0.9 % bolus 1,000 mL (0 mLs Intravenous Stopped 07/23/18 1851)  iopamidol (ISOVUE-300) 61 % injection 100 mL (100 mLs Intravenous Contrast Given 07/23/18 1714)     Initial  Impression / Assessment and Plan / ED Course  I have reviewed the triage vital signs and the nursing notes.  Pertinent labs & imaging results that were available during my care of the patient were reviewed by me and considered in my medical decision making (see chart for details).     Patient seen and examined. Work-up initiated. Medications ordered. Will start on CIWA at etoh withdrawal may be contributing. Will need to perform rectal exam to eval for GI bleeding.   Vital signs reviewed and are as follows: BP (!) 157/109   Pulse (!) 127   Temp 99.4 F (37.4 C) (Oral)   Ht 6\' 2"  (1.88 m)   Wt 104.3 kg   SpO2 95%   BMI 29.53 kg/m   3:00 PM patient has received IV Ativan, fluids.  Rectal exam performed with RN chaperone.  Stool is brown mixed with black particles.  Patient continued to have vomiting.  4:58 PM Spoke with [facility]. Will admit to stepdown. Requests lipase, CT abd, protonix drip. Ordered.   6:53 PM CT results reviewed. Lipase normal.  Pt vomiting again. Saline at 125cc/hr, reglan ordered. Vitals stable but still tachycardic.   BP 121/81   Pulse (!) 131   Temp 99.4 F (37.4 C) (Oral)   Resp (!) 22   Ht 6\' 2"  (1.88 m)   Wt 104.3 kg   SpO2 99%   BMI 29.53 kg/m    CRITICAL CARE Performed by: Renne CriglerJoshua Jarell Mcewen PA-C Total critical care time: 45 minutes Critical care time was exclusive of separately billable procedures and treating other patients. Critical care was necessary to treat or prevent imminent or life-threatening  deterioration. Critical care was time spent personally by me on the following activities: development of treatment plan with patient and/or surrogate as well as nursing, discussions with consultants, evaluation of patient's response to treatment, examination of patient, obtaining history from patient or surrogate, ordering and performing treatments and interventions, ordering and review of laboratory studies, ordering and review of radiographic studies, pulse oximetry and re-evaluation of patient's condition.  9:48 PM Patient en route. Hgb rechecked = 13.   Final Clinical Impressions(s) / ED Diagnoses   Final diagnoses:  Upper GI bleeding  Intractable vomiting with nausea  Alcohol abuse   Admit to stepdown.   ED Discharge Orders    None       Renne CriglerGeiple, Karisa Nesser, Cordelia Poche-C 07/23/18 2148    Gwyneth SproutPlunkett, Whitney, MD 07/23/18 [phone]

## 2018-07-23 NOTE — ED Notes (Signed)
Pt c/o nausea returning. EDPA Geiple notified

## 2018-07-23 NOTE — ED Notes (Signed)
ETOH 331, results given to ED PA and Joss RN

## 2018-07-23 NOTE — ED Notes (Signed)
PT sleeping. O2 sats 88-90% on RA. EDPA Geiple notified. Pt placed on 2L [place]

## 2018-07-23 NOTE — ED Triage Notes (Signed)
Received pt from home with c/o N/V x 3-4 days. Pt reported to  EMS that he noticed dark colored stools and coffee ground color emesis. Last alcohol intake was last night.

## 2018-07-24 ENCOUNTER — Encounter (HOSPITAL_COMMUNITY)
Admission: EM | Disposition: A | Discharge status: Home / Self Care | Payer: Self-pay | Source: Home / Self Care | Attending: Internal Medicine

## 2018-07-24 ENCOUNTER — Observation Stay (HOSPITAL_COMMUNITY): Payer: Managed Care, Other (non HMO) | Admitting: Anesthesiology

## 2018-07-24 ENCOUNTER — Encounter (HOSPITAL_COMMUNITY): Payer: Self-pay | Admitting: Certified Registered Nurse Anesthetist

## 2018-07-24 DIAGNOSIS — R195 Other fecal abnormalities: Secondary | ICD-10-CM

## 2018-07-24 DIAGNOSIS — K2100 Gastro-esophageal reflux disease with esophagitis, without bleeding: Secondary | ICD-10-CM

## 2018-07-24 DIAGNOSIS — F1010 Alcohol abuse, uncomplicated: Secondary | ICD-10-CM

## 2018-07-24 DIAGNOSIS — R112 Nausea with vomiting, unspecified: Secondary | ICD-10-CM

## 2018-07-24 DIAGNOSIS — K922 Gastrointestinal hemorrhage, unspecified: Secondary | ICD-10-CM

## 2018-07-24 HISTORY — PX: ESOPHAGOGASTRODUODENOSCOPY: SHX5428

## 2018-07-24 LAB — COMPREHENSIVE METABOLIC PANEL
ALT: 29 U/L (ref 0–44)
ANION GAP: 14 (ref 5–15)
AST: 34 U/L (ref 15–41)
Albumin: 3.6 g/dL (ref 3.5–5.0)
Alkaline Phosphatase: 32 U/L — ABNORMAL LOW (ref 38–126)
BUN: 9 mg/dL (ref 6–20)
CO2: 23 mmol/L (ref 22–32)
Calcium: 7.9 mg/dL — ABNORMAL LOW (ref 8.9–10.3)
Chloride: 101 mmol/L (ref 98–111)
Creatinine, Ser: 0.98 mg/dL (ref 0.61–1.24)
GFR calc non Af Amer: >60 mL/min (ref >60)
Glucose, Bld: 92 mg/dL (ref 70–99)
Potassium: 3.7 mmol/L (ref 3.5–5.1)
Sodium: 138 mmol/L (ref 135–145)
Total Bilirubin: 1 mg/dL (ref 0.3–1.2)
Total Protein: 5.7 g/dL — ABNORMAL LOW (ref 6.5–8.1)

## 2018-07-24 LAB — CBC
HCT: 33.6 % — ABNORMAL LOW (ref 39.0–52.0)
Hemoglobin: 11.1 g/dL — ABNORMAL LOW (ref 13.0–17.0)
MCH: 33 pg (ref 26.0–34.0)
MCHC: 33 g/dL (ref 30.0–36.0)
MCV: 100 fL (ref 80.0–100.0)
Platelets: 164 10*3/uL (ref 150–400)
RBC: 3.36 MIL/uL — AB (ref 4.22–5.81)
RDW: 13.8 % (ref 11.5–15.5)
WBC: 5.9 10*3/uL (ref 4.0–10.5)
nRBC: 0 % (ref 0.0–0.2)

## 2018-07-24 LAB — TYPE AND SCREEN
ABO/RH(D): A POS
ANTIBODY SCREEN: NEGATIVE

## 2018-07-24 LAB — LACTIC ACID, PLASMA
Lactic Acid, Venous: 2.3 mmol/L (ref 0.5–1.9)
Lactic Acid, Venous: 2.3 mmol/L (ref 0.5–1.9)

## 2018-07-24 LAB — ABO/RH: ABO/RH(D): A POS

## 2018-07-24 LAB — HIV ANTIBODY (ROUTINE TESTING W REFLEX): HIV Screen 4th Generation wRfx: NONREACTIVE

## 2018-07-24 SURGERY — EGD (ESOPHAGOGASTRODUODENOSCOPY)
Anesthesia: Monitor Anesthesia Care

## 2018-07-24 MED ORDER — MIDAZOLAM HCL 2 MG/2ML IJ SOLN
INTRAMUSCULAR | Status: AC
  Filled 2018-07-24: qty 2

## 2018-07-24 MED ORDER — LACTATED RINGERS IV BOLUS
1000.0000 mL | Freq: Once | INTRAVENOUS | Status: AC
  Administered 2018-07-24: 1000 mL via INTRAVENOUS

## 2018-07-24 MED ORDER — HYDRALAZINE HCL 20 MG/ML IJ SOLN
5.0000 mg | Freq: Four times a day (QID) | INTRAMUSCULAR | Status: DC | PRN
  Administered 2018-07-24 – 2018-07-25 (×2): 5 mg via INTRAVENOUS
  Filled 2018-07-24 (×2): qty 1

## 2018-07-24 MED ORDER — SODIUM CHLORIDE 0.9 % IV SOLN: INTRAVENOUS | Status: DC

## 2018-07-24 MED ORDER — LACTATED RINGERS IV BOLUS: 500.0000 mL | Freq: Once | INTRAVENOUS | Status: DC

## 2018-07-24 MED ORDER — ORAL CARE MOUTH RINSE
15.0000 mL | Freq: Two times a day (BID) | OROMUCOSAL | Status: DC
  Administered 2018-07-24 – 2018-07-25 (×3): 15 mL via OROMUCOSAL

## 2018-07-24 MED ORDER — HYDRALAZINE HCL 20 MG/ML IJ SOLN
5.0000 mg | Freq: Three times a day (TID) | INTRAMUSCULAR | Status: DC | PRN
  Administered 2018-07-24: 5 mg via INTRAVENOUS
  Filled 2018-07-24: qty 1

## 2018-07-24 MED ORDER — MIDAZOLAM HCL 5 MG/5ML IJ SOLN
INTRAMUSCULAR | Status: DC | PRN
  Administered 2018-07-24: 2 mg via INTRAVENOUS

## 2018-07-24 MED ORDER — PROPOFOL 500 MG/50ML IV EMUL
INTRAVENOUS | Status: DC | PRN
  Administered 2018-07-24 (×2): 30 mg via INTRAVENOUS

## 2018-07-24 MED ORDER — PROPOFOL 500 MG/50ML IV EMUL
INTRAVENOUS | Status: DC | PRN
  Administered 2018-07-24: 175 ug/kg/min via INTRAVENOUS

## 2018-07-24 MED ORDER — PROPOFOL 10 MG/ML IV BOLUS
INTRAVENOUS | Status: AC
  Filled 2018-07-24: qty 40

## 2018-07-24 MED ORDER — PANTOPRAZOLE SODIUM 40 MG PO TBEC
40.0000 mg | DELAYED_RELEASE_TABLET | Freq: Every day | ORAL | Status: DC
  Administered 2018-07-25: 40 mg via ORAL
  Filled 2018-07-24 (×2): qty 1

## 2018-07-24 MED ORDER — SODIUM CHLORIDE 0.9 % IV SOLN
INTRAVENOUS | Status: AC
  Administered 2018-07-24 – 2018-07-25 (×4): via INTRAVENOUS

## 2018-07-24 NOTE — Progress Notes (Signed)
PROGRESS NOTE    Brett Molina  XOV:[id number] DOB: 06-07-1991 DOA: 07/23/2018 PCP: Patient, No Pcp Per   Brief Narrative: 28 year old man with medical problems including hepatic steatosis and alcohol misuse, and hypertension presenting with nausea and vomiting.  Patient reports onset of symptoms 4 days ago.  Has been having inability to keep down any p.o. food or drink per his report.  Emesis has been constant, at times having an appearance of coffee grounds.  He denies melena, maroon stool, hematochezia.  He does report a 10 pound unintentional weight loss over the past 3 months, decreased appetite, subjective fevers, and diaphoresis.  He drinks alcohol per his report 3-4 times weekly, he reports drinking 10 ounces of gin on those occasions.  Last drink was July 23, 2018 at 1 PM.  He reports attempting to self medicate with alcohol.  He denies nonsteroidal anti-inflammatory medication use, denies the symptoms ever occurring before.  No lightheadedness or dizziness, or syncope.  He reports having 1 bowel movement a day, dark in color, no overt melena.  Has never had an EGD.  At baseline, he works as an Airline pilot, does not smoke, resides locally in [place].  ED Course: Prior to arrival on the stepdown unit, vital signs remarkable for tachycardia with a rate of 133, systolic blood pressure above 004, diagnostics including CBC with hemoglobin of 15.1 with a repeat hemoglobin of 13.2.  BMP with BUN and creatinine of 8 and 0.86 respectively.  Alcohol detectable at 331 milligrams per deciliter.  PT/INR normal.  Lipase within normal months.  Fecal occult positive.  CT of the abdomen pelvis was performed which revealed evidence of hepatic steatosis as well as distal esophageal thickening suggestive of esophagitis.  Also was seen moderate layering right pleural effusion.  Patient was given IV PPI followed by a Protonix drip, octreotide and Reglan.  He was given 2 L normal saline  and accepted to the stepdown unit at [facility] for further management.   Assessment & Plan:   Active Problems:   GI bleed   GIB (gastrointestinal bleeding)   #1 upper GI bleed patient presented with complaints of nausea and vomiting and coffee-ground emesis for few days prior to admission to hospital.  He is treated with IV fluids and IV Protonix.  Heme positive.  GI has been consulted.  EGD planned for this morning.  Patient does have history of significant alcohol abuse.  #2 alcohol abuse upon admission his alcohol level was 330.  Patient and fianc does not want other family members to know that he drinks alcohol.  He is requesting that any family member to be removed from the room while talking about his health care.  Continue CIWA protocol.  CT scan shows findings consistent with esophagitis and hepatic steatosis.   Estimated body mass index is 29.53 kg/m as calculated from the following:   Height as of this encounter: 6\' 2"  (1.88 m).   Weight as of this encounter: 104.3 kg.  DVT prophylaxis: scd Code Status: full Family Communication: Girlfriend or fianc in the room Disposition Plan: .  Pending GI work-up and clinical improvement   Consultants: GI Procedures: None Antimicrobials: None  Subjective: Resting in bed has not had any further vomiting or black stools since admission.  Objective: Vitals:   07/24/18 0200 07/24/18 0300 07/24/18 0400 07/24/18 0800  BP: (!) 158/66     Pulse: (!) 115 (!) 108    Resp: 20     Temp:  98.2 F (36.8 C) 98.6 F (37 C)  TempSrc:   Oral Oral  SpO2: 95%     Weight:      Height:        Intake/Output Summary (Last 24 hours) at 07/24/2018 1119 Last data filed at 07/24/2018 0800 Gross per 24 hour  Intake 5574.1 ml  Output 1250 ml  Net 4324.1 ml   Filed Weights   07/23/18 1358  Weight: 104.3 kg    Examination:  General exam: Appears calm and comfortable  Respiratory system: Clear to auscultation. Respiratory  effort normal. Cardiovascular system: S1 & S2 heard, RRR. No JVD, murmurs, rubs, gallops or clicks. No pedal edema. Gastrointestinal system: Abdomen is nondistended, soft and nontender. No organomegaly or masses felt. Normal bowel sounds heard. Central nervous system: Alert and oriented. No focal neurological deficits. Extremities: Symmetric 5 x 5 power. Skin: No rashes, lesions or ulcers Psychiatry: Judgement and insight appear normal. Mood & affect appropriate.     Data Reviewed: I have personally reviewed following labs and imaging studies  CBC: Recent Labs  Lab 07/23/18 1532 07/23/18 2042 07/24/18 0252  WBC 9.7  --  5.9  NEUTROABS 7.4  --   --   HGB 15.1 13.2 11.1*  HCT 44.2 39.4 33.6*  MCV 94.0  --  100.0  PLT 232  --  164   Basic Metabolic Panel: Recent Labs  Lab 07/23/18 1532 07/24/18 0252  NA 133* 138  K 3.7 3.7  CL 93* 101  CO2 22 23  GLUCOSE 111* 92  BUN 8 9  CREATININE 0.86 0.98  CALCIUM 8.5* 7.9*   GFR: Estimated Creatinine Clearance: 141.9 mL/min (by C-G formula based on SCr of 0.98 mg/dL). Liver Function Tests: Recent Labs  Lab 07/23/18 1532 07/24/18 0252  AST 46* 34  ALT 40 29  ALKPHOS 46 32*  BILITOT 0.7 1.0  PROT 7.5 5.7*  ALBUMIN 4.5 3.6   Recent Labs  Lab 07/23/18 1532  LIPASE 25   No results for input(s): AMMONIA in the last 168 hours. Coagulation Profile: Recent Labs  Lab 07/23/18 1532  INR 0.99   Cardiac Enzymes: No results for input(s): CKTOTAL, CKMB, CKMBINDEX, TROPONINI in the last 168 hours. BNP (last 3 results) No results for input(s): PROBNP in the last 8760 hours. HbA1C: No results for input(s): HGBA1C in the last 72 hours. CBG: No results for input(s): GLUCAP in the last 168 hours. Lipid Profile: No results for input(s): CHOL, HDL, LDLCALC, TRIG, CHOLHDL, LDLDIRECT in the last 72 hours. Thyroid Function Tests: No results for input(s): TSH, T4TOTAL, FREET4, T3FREE, THYROIDAB in the last 72 hours. Anemia  Panel: No results for input(s): VITAMINB12, FOLATE, FERRITIN, TIBC, IRON, RETICCTPCT in the last 72 hours. Sepsis Labs: Recent Labs  Lab 07/23/18 2353 07/24/18 0252  LATICACIDVEN 2.3* 2.3*    Recent Results (from the past 240 hour(s))  MRSA PCR Screening     Status: None   Collection Time: 07/23/18  9:47 PM  Result Value Ref Range Status   MRSA by PCR NEGATIVE NEGATIVE Final    Comment:        The GeneXpert MRSA Assay (FDA approved for NASAL specimens only), is one component of a comprehensive MRSA colonization surveillance program. It is not intended to diagnose MRSA infection nor to guide or monitor treatment for MRSA infections. Performed at [facility], [address] [place], [place] [address]          Radiology Studies: Ct Abdomen Pelvis W Contrast  Result Date: 07/23/2018 CLINICAL DATA:  100 mL isovue 300 given, tolerated well without complication or reaction No PO contrast per PA order, not tolerating liquids Pt with n/v, hematemesis, fever Symptoms x 3-4 days, no pain at this time Hx HTN Labs within normal range^188mL ISOVUE-300 IOPAMIDOL (ISOVUE-300) INJECTION 61%Hematemesis EXAM: CT ABDOMEN AND PELVIS WITH CONTRAST TECHNIQUE: Multidetector CT imaging of the abdomen and pelvis was performed using the standard protocol following bolus administration of intravenous contrast. CONTRAST:  ISOVUE-300 IOPAMIDOL (ISOVUE-300) INJECTION 61% COMPARISON:  None. FINDINGS: Lower chest: Moderate RIGHT effusion layers dependently in the RIGHT hemithorax. Mild RIGHT basilar atelectasis. No pneumonitis or pneumonia. Hepatobiliary: Hypodense regions along the falciform ligament and the gallbladder fossa most consistent fatty infiltration liver. Liver overall is low-attenuation which could indicate hepatic steatosis. Gallbladder normal. Normal common bile duct. Pancreas: Pancreas is normal. No ductal dilatation. No pancreatic inflammation. Spleen: Normal spleen  Adrenals/urinary tract: Adrenal glands and kidneys are normal. The ureters and bladder normal. Stomach/Bowel: There is circumferential mural thickening of the distal esophagus from GE junction superiorly to the upper limits of the scan range (6 cm). Single wall thickness measures 9 mm (image 6/2). The stomach, duodenum, small-bowel appendix normal. The colon and rectosigmoid colon normal. Vascular/Lymphatic: Abdominal aorta is normal caliber. No periportal or retroperitoneal adenopathy. No pelvic adenopathy. Reproductive: Prostate normal Other: No free fluid. Musculoskeletal: No aggressive osseous lesion. IMPRESSION: 1. Long segment of circumferential mucosal thickening of the distal esophagus suggests esophagitis. Recommend clinical correlation and consider upper endoscopy. 2. Moderate layering RIGHT pleural effusion. 3. Potential hepatic steatosis and fatty infiltration along the gallbladder fossa and falciform ligament. Electronically Signed   By: Genevive Bi M.D.   On: 07/23/2018 18:30        Scheduled Meds: . Influenza vac split quadrivalent PF  0.5 mL Intramuscular Tomorrow-1000  . LORazepam  0-4 mg Intravenous Q6H   Or  . LORazepam  0-4 mg Oral Q6H  . [START ON 07/25/2018] LORazepam  0-4 mg Intravenous Q12H   Or  . [START ON 07/25/2018] LORazepam  0-4 mg Oral Q12H  . mouth rinse  15 mL Mouth Rinse BID  . pneumococcal 23 valent vaccine  0.5 mL Intramuscular Tomorrow-1000  . sodium chloride flush  3 mL Intravenous Q12H  . thiamine  100 mg Oral Daily   Or  . thiamine  100 mg Intravenous Daily   Continuous Infusions: . sodium chloride 150 mL/hr at 07/24/18 1048  . sodium chloride    . pantoprozole (PROTONIX) infusion 8 mg/hr (07/24/18 0354)     LOS: 1 day    Alwyn Ren, MD [facility] If 7PM-7AM, please contact night-coverage [email or web address] Password [email or web address] 07/24/2018, 11:19 AM

## 2018-07-24 NOTE — Anesthesia Preprocedure Evaluation (Addendum)
Anesthesia Evaluation  Patient identified by MRN, date of birth, ID band Patient awake    Reviewed: Allergy & Precautions, NPO status , Patient's Chart, lab work & pertinent test results  History of Anesthesia Complications Negative for: history of anesthetic complications  Airway Mallampati: II  TM Distance: >3 FB Neck ROM: Full    Dental  (+) Dental Advisory Given, Teeth Intact   Pulmonary neg pulmonary ROS,    breath sounds clear to auscultation       Cardiovascular hypertension (noncompliant, no meds x months),  Rhythm:Regular Rate:Tachycardia     Neuro/Psych PSYCHIATRIC DISORDERS Depression negative neurological ROS     GI/Hepatic negative GI ROS, (+)     substance abuse  alcohol use,   Endo/Other    Renal/GU negative Renal ROS     Musculoskeletal negative musculoskeletal ROS (+)   Abdominal   Peds  Hematology  (+) anemia ,   Anesthesia Other Findings   Reproductive/Obstetrics                            Anesthesia Physical Anesthesia Plan  ASA: III  Anesthesia Plan: MAC   Post-op Pain Management:    Induction: Intravenous  PONV Risk Score and Plan: 1 and Treatment may vary due to age or medical condition  Airway Management Planned: Nasal Cannula and Natural Airway  Additional Equipment: None  Intra-op Plan:   Post-operative Plan:   Informed Consent: I have reviewed the patients History and Physical, chart, labs and discussed the procedure including the risks, benefits and alternatives for the proposed anesthesia with the patient or authorized representative who has indicated his/her understanding and acceptance.       Plan Discussed with: CRNA and Anesthesiologist  Anesthesia Plan Comments:        Anesthesia Quick Evaluation

## 2018-07-24 NOTE — Plan of Care (Signed)
Plan of care  Lactic acid of 2.3.  Will provide additional 1 L LR bolus, re-check lactic acid thereafter with hb/hct for trend.  Elder Love, MD

## 2018-07-24 NOTE — Progress Notes (Signed)
Also, patient and his girlfriend, Joanne Gavel, are asking that any family members be asked to leave when discussing his care as they do not wish for them to know about his ETOH use, etc.

## 2018-07-24 NOTE — Consult Note (Addendum)
Referring Provider:  Dr. Jerolyn Center, [facility] Primary Care Physician:  Patient, No Pcp Per Primary Gastroenterologist:  None, unassigned  Reason for Consultation:  Vomiting dark material, anemia, heme positive stools  HPI: Brett Molina is a 28 y.o. male with medical problems including hepatic steatosis and alcohol misuse as well as hypertension who presented with complaints of vomiting dark material.  Patient reports onset of symptoms on Thursday, 1/16.  His girlfriend was present at the time of my interview.  They say that he had been drinking cranberry juice so initially they thought that the dark-colored material in his emesis was cranberry juice, but then it continued despite no longer drinking that.  He has not been tolerating many liquids p.o.  He denies melena, maroon stool, hematochezia.    Says that he has actually not even had a bowel movement in several days.  He denies NSAID use.  Denies any complaints of heartburn, reflux, indigestion, dysphasia, abdominal pain.  He drinks alcohol per his report 3-4 times weekly, he reports drinking 10 ounces of gin on those occasions.  Last drink was July 23, 2018 at 1 PM when he drank a large bottle of wine (but says that he kept vomiting it back up).  He reports attempting to self medicate with alcohol.  No similar episodes of this in the past.    Alcohol detectable at 331 milligrams per deciliter upon presentation.  PT/INR normal.  Lipase within normal limits.  Fecal occult positive.  Hgb down from 14-15 grams to 11.1 grams this AM.  CT scan abdomen and pelvis with contrast showed the following:  IMPRESSION: 1. Long segment of circumferential mucosal thickening of the distal esophagus suggests esophagitis. Recommend clinical correlation and consider upper endoscopy. 2. Moderate layering RIGHT pleural effusion. 3. Potential hepatic steatosis and fatty infiltration along the gallbladder fossa and falciform ligament.  Is on PPI gtt and  CIWA protocol.   Past Medical History:  Diagnosis Date  . ETOH abuse   . Hypertension     History reviewed. No pertinent surgical history.  Prior to Admission medications   Medication Sig Start Date End Date Taking? Authorizing Provider  amLODipine (NORVASC) 10 MG tablet Take 1 tablet (10 mg total) by mouth daily. Patient not taking: Reported on 02/16/2017 12/27/16   Glade Lloyd, MD  chlordiazePOXIDE (LIBRIUM) 25 MG capsule 50mg  PO TID x 1D, then 25-50mg  PO BID X 1D, then 25-50mg  PO QD X 1D Patient not taking: Reported on 07/23/2018 03/29/17   Robinson, [place] N, PA-C  chlordiazePOXIDE (LIBRIUM) 25 MG capsule Day 1: Take 2 tablets every 8 hours Day 2: Take 1 tablet every 6 hours Day 3: Take 1 tablet twice daily Day 4: Take 1 tablet at night Patient not taking: Reported on 07/23/2018 06/22/18   Sabas Sous, MD  folic acid (FOLVITE) 1 MG tablet Take 1 tablet (1 mg total) by mouth daily. Patient not taking: Reported on 02/16/2017 12/27/16   Glade Lloyd, MD  gabapentin (NEURONTIN) 300 MG capsule Take 1 capsule (300 mg total) by mouth 3 (three) times daily. Patient not taking: Reported on 03/28/2017 02/18/17   Standley Brooking, MD  hydrALAZINE (APRESOLINE) 50 MG tablet Take 1 tablet (50 mg total) by mouth every 8 (eight) hours. Patient not taking: Reported on 02/16/2017 12/27/16   Glade Lloyd, MD  labetalol (NORMODYNE) 300 MG tablet Take 1 tablet (300 mg total) by mouth 2 (two) times daily. Patient not taking: Reported on 02/16/2017 12/27/16   Glade Lloyd, MD  Multiple Vitamin (MULTIVITAMIN WITH MINERALS) TABS tablet Take 1 tablet by mouth daily. Patient not taking: Reported on 02/16/2017 12/27/16   Glade Lloyd, MD  thiamine 100 MG tablet Take 1 tablet (100 mg total) by mouth daily. Patient not taking: Reported on 07/23/2018 12/27/16   Glade Lloyd, MD    Current Facility-Administered Medications  Medication Dose Route Frequency Provider Last Rate Last Dose  . 0.9 %  sodium  chloride infusion   Intravenous Continuous Alwyn Ren, MD      . hydrALAZINE (APRESOLINE) injection 5 mg  5 mg Intravenous Q8H PRN Audrea Muscat T, NP   5 mg at 07/24/18 0834  . Influenza vac split quadrivalent PF (FLUARIX) injection 0.5 mL  0.5 mL Intramuscular Tomorrow-1000 Elder Love, MD      . LORazepam (ATIVAN) injection 0-4 mg  0-4 mg Intravenous Q6H Elder Love, MD   2 mg at 07/23/18 2300   Or  . LORazepam (ATIVAN) tablet 0-4 mg  0-4 mg Oral Q6H Elder Love, MD      . Melene Muller ON 07/25/2018] LORazepam (ATIVAN) injection 0-4 mg  0-4 mg Intravenous Q12H Elder Love, MD       Or  . Melene Muller ON 07/25/2018] LORazepam (ATIVAN) tablet 0-4 mg  0-4 mg Oral Q12H Elder Love, MD      . MEDLINE mouth rinse  15 mL Mouth Rinse BID Elder Love, MD   15 mL at 07/24/18 0323  . ondansetron (ZOFRAN) injection 4 mg  4 mg Intravenous Q6H PRN Elder Love, MD   4 mg at 07/24/18 0815  . pantoprazole (PROTONIX) 80 mg in sodium chloride 0.9 % 250 mL (0.32 mg/mL) infusion  8 mg/hr Intravenous Continuous Elder Love, MD 25 mL/hr at 07/24/18 0354 8 mg/hr at 07/24/18 0354  . pneumococcal 23 valent vaccine (PNU-IMMUNE) injection 0.5 mL  0.5 mL Intramuscular Tomorrow-1000 Elder Love, MD      . sodium chloride flush (NS) 0.9 % injection 3 mL  3 mL Intravenous Q12H Elder Love, MD   3 mL at 07/23/18 2306  . thiamine (VITAMIN B-1) tablet 100 mg  100 mg Oral Daily Elder Love, MD       Or  . thiamine (B-1) injection 100 mg  100 mg Intravenous Daily Elder Love, MD   100 mg at 07/23/18 1511    Allergies as of 07/23/2018  . (No Known Allergies)    History reviewed. No pertinent family history.  Social History   Socioeconomic History  . Marital status: Single    Spouse name: Not on file  . Number of children: Not on file  . Years of education: Not on file  . Highest education level: Not on file  Occupational History  . Not on  file  Social Needs  . Financial resource strain: Not on file  . Food insecurity:    Worry: Not on file    Inability: Not on file  . Transportation needs:    Medical: Not on file    Non-medical: Not on file  Tobacco Use  . Smoking status: Never Smoker  . Smokeless tobacco: Never Used  Substance and Sexual Activity  . Alcohol use: Yes    Alcohol/week: 14.0 standard drinks    Types: 14 Standard drinks or equivalent per week    Comment: Patient drank vodka yesterday  . Drug use: No  . Sexual activity: Not on file  Lifestyle  . Physical activity:  Days per week: Not on file    Minutes per session: Not on file  . Stress: Not on file  Relationships  . Social connections:    Talks on phone: Not on file    Gets together: Not on file    Attends religious service: Not on file    Active member of club or organization: Not on file    Attends meetings of clubs or organizations: Not on file    Relationship status: Not on file  . Intimate partner violence:    Fear of current or ex partner: Not on file    Emotionally abused: Not on file    Physically abused: Not on file    Forced sexual activity: Not on file  Other Topics Concern  . Not on file  Social History Narrative  . Not on file    Review of Systems: ROS is negative except as mentioned in HPI.  Physical Exam: Vital signs in last 24 hours: Temp:  [98.2 F (36.8 C)-99.5 F (37.5 C)] 98.6 F (37 C) (01/19 0800) Pulse Rate:  [107-134] 108 (01/19 0300) Resp:  [13-39] 20 (01/19 0200) BP: (119-165)/(56-109) 158/66 (01/19 0200) SpO2:  [87 %-100 %] 95 % (01/19 0200) Weight:  [104.3 kg] 104.3 kg (01/18 1358)   General:  Alert, Well-developed, well-nourished, pleasant and cooperative in NAD Head:  Normocephalic and atraumatic. Eyes:  Sclera clear, no icterus.  Conjunctiva pink. Ears:  Normal auditory acuity. Mouth:  No deformity or lesions.   Lungs:  Clear throughout to auscultation.  No wheezes, crackles, or rhonchi.    Heart:  Tachy.  No murmurs noted. Abdomen:  Soft, non-distended.  BS present.  Non-tender. Rectal:  Deferred  Msk:  Symmetrical without gross deformities. Pulses:  Normal pulses noted. Extremities:  Without clubbing or edema. Neurologic:  Alert and oriented x 4;  grossly normal neurologically. Skin:  Intact without significant lesions or rashes. Psych:  Alert and cooperative. Normal mood and affect.  Intake/Output from previous day: 01/18 0701 - 01/19 0700 In: 5574.1 [I.V.:1617.2; IV Piggyback:3956.9] Out: 850 [Urine:850] Intake/Output this shift: Total I/O In: -  Out: 400 [Urine:400]  Lab Results: Recent Labs    07/23/18 1532 07/23/18 2042 07/24/18 0252  WBC 9.7  --  5.9  HGB 15.1 13.2 11.1*  HCT 44.2 39.4 33.6*  PLT 232  --  164   BMET Recent Labs    07/23/18 1532 07/24/18 0252  NA 133* 138  K 3.7 3.7  CL 93* 101  CO2 22 23  GLUCOSE 111* 92  BUN 8 9  CREATININE 0.86 0.98  CALCIUM 8.5* 7.9*   LFT Recent Labs    07/24/18 0252  PROT 5.7*  ALBUMIN 3.6  AST 34  ALT 29  ALKPHOS 32*  BILITOT 1.0   PT/INR Recent Labs    07/23/18 1532  LABPROT 13.0  INR 0.99   Studies/Results: Ct Abdomen Pelvis W Contrast  Result Date: 07/23/2018 CLINICAL DATA:  100 mL isovue 300 given, tolerated well without complication or reaction No PO contrast per PA order, not tolerating liquids Pt with n/v, hematemesis, fever Symptoms x 3-4 days, no pain at this time Hx HTN Labs within normal range^176mL ISOVUE-300 IOPAMIDOL (ISOVUE-300) INJECTION 61%Hematemesis EXAM: CT ABDOMEN AND PELVIS WITH CONTRAST TECHNIQUE: Multidetector CT imaging of the abdomen and pelvis was performed using the standard protocol following bolus administration of intravenous contrast. CONTRAST:  ISOVUE-300 IOPAMIDOL (ISOVUE-300) INJECTION 61% COMPARISON:  None. FINDINGS: Lower chest: Moderate RIGHT effusion layers dependently in  the RIGHT hemithorax. Mild RIGHT basilar atelectasis. No pneumonitis or  pneumonia. Hepatobiliary: Hypodense regions along the falciform ligament and the gallbladder fossa most consistent fatty infiltration liver. Liver overall is low-attenuation which could indicate hepatic steatosis. Gallbladder normal. Normal common bile duct. Pancreas: Pancreas is normal. No ductal dilatation. No pancreatic inflammation. Spleen: Normal spleen Adrenals/urinary tract: Adrenal glands and kidneys are normal. The ureters and bladder normal. Stomach/Bowel: There is circumferential mural thickening of the distal esophagus from GE junction superiorly to the upper limits of the scan range (6 cm). Single wall thickness measures 9 mm (image 6/2). The stomach, duodenum, small-bowel appendix normal. The colon and rectosigmoid colon normal. Vascular/Lymphatic: Abdominal aorta is normal caliber. No periportal or retroperitoneal adenopathy. No pelvic adenopathy. Reproductive: Prostate normal Other: No free fluid. Musculoskeletal: No aggressive osseous lesion. IMPRESSION: 1. Long segment of circumferential mucosal thickening of the distal esophagus suggests esophagitis. Recommend clinical correlation and consider upper endoscopy. 2. Moderate layering RIGHT pleural effusion. 3. Potential hepatic steatosis and fatty infiltration along the gallbladder fossa and falciform ligament. Electronically Signed   By: Genevive BiStewart  Edmunds M.D.   On: 07/23/2018 18:30   IMPRESSION:  *CGE, heme positive stools:  Several episodes of dark emesis.  Heme positive but no black or blood stools, actually no BM in several days.  CT scan suggests esophagitis. *Anemia:  Hgb down about 3-3.5 grams from baseline.  11.1 grams this AM. *ETOH intoxication/abuse:  Has hepatic steatosis on imaging but no signs of cirrhosis per se.  PLAN: *Continue PPI gtt for now. *Monitor Hgb and transfuse prn. *EGD this AM. *Monitor for DT's. *Needs ETOH cessation/abstinence and I expressed that to both the patient and his girlfriend.   Princella PellegriniJessica D. Zehr   07/24/2018, 9:34 AM   ________________________________________________________________________  [facility] MD note:  I personally examined the patient, reviewed the data and agree with the assessment and plan described above.  Not sure if he's had any overt GI bleeding but with heme positive stool ("brown stool with flecks of black"), drop in Hb and subjective CG emesis it is probably safest to proceed with EGD. Planning for this AM   Rob Buntinganiel Yina Riviere, MD [facility] Pager [phone]

## 2018-07-24 NOTE — H&P (View-Only) (Signed)
Referring Provider:  Dr. Jerolyn Center, [facility] Primary Care Physician:  Patient, No Pcp Per Primary Gastroenterologist:  None, unassigned  Reason for Consultation:  Vomiting dark material, anemia, heme positive stools  HPI: Brett Molina is a 28 y.o. male with medical problems including hepatic steatosis and alcohol misuse as well as hypertension who presented with complaints of vomiting dark material.  Patient reports onset of symptoms on Thursday, 1/16.  His girlfriend was present at the time of my interview.  They say that he had been drinking cranberry juice so initially they thought that the dark-colored material in his emesis was cranberry juice, but then it continued despite no longer drinking that.  He has not been tolerating many liquids p.o.  He denies melena, maroon stool, hematochezia.    Says that he has actually not even had a bowel movement in several days.  He denies NSAID use.  Denies any complaints of heartburn, reflux, indigestion, dysphasia, abdominal pain.  He drinks alcohol per his report 3-4 times weekly, he reports drinking 10 ounces of gin on those occasions.  Last drink was July 23, 2018 at 1 PM when he drank a large bottle of wine (but says that he kept vomiting it back up).  He reports attempting to self medicate with alcohol.  No similar episodes of this in the past.    Alcohol detectable at 331 milligrams per deciliter upon presentation.  PT/INR normal.  Lipase within normal limits.  Fecal occult positive.  Hgb down from 14-15 grams to 11.1 grams this AM.  CT scan abdomen and pelvis with contrast showed the following:  IMPRESSION: 1. Long segment of circumferential mucosal thickening of the distal esophagus suggests esophagitis. Recommend clinical correlation and consider upper endoscopy. 2. Moderate layering RIGHT pleural effusion. 3. Potential hepatic steatosis and fatty infiltration along the gallbladder fossa and falciform ligament.  Is on PPI gtt and  CIWA protocol.   Past Medical History:  Diagnosis Date  . ETOH abuse   . Hypertension     History reviewed. No pertinent surgical history.  Prior to Admission medications   Medication Sig Start Date End Date Taking? Authorizing Provider  amLODipine (NORVASC) 10 MG tablet Take 1 tablet (10 mg total) by mouth daily. Patient not taking: Reported on 02/16/2017 12/27/16   Glade Lloyd, MD  chlordiazePOXIDE (LIBRIUM) 25 MG capsule 50mg  PO TID x 1D, then 25-50mg  PO BID X 1D, then 25-50mg  PO QD X 1D Patient not taking: Reported on 07/23/2018 03/29/17   Robinson, [place] N, PA-C  chlordiazePOXIDE (LIBRIUM) 25 MG capsule Day 1: Take 2 tablets every 8 hours Day 2: Take 1 tablet every 6 hours Day 3: Take 1 tablet twice daily Day 4: Take 1 tablet at night Patient not taking: Reported on 07/23/2018 06/22/18   Sabas Sous, MD  folic acid (FOLVITE) 1 MG tablet Take 1 tablet (1 mg total) by mouth daily. Patient not taking: Reported on 02/16/2017 12/27/16   Glade Lloyd, MD  gabapentin (NEURONTIN) 300 MG capsule Take 1 capsule (300 mg total) by mouth 3 (three) times daily. Patient not taking: Reported on 03/28/2017 02/18/17   Standley Brooking, MD  hydrALAZINE (APRESOLINE) 50 MG tablet Take 1 tablet (50 mg total) by mouth every 8 (eight) hours. Patient not taking: Reported on 02/16/2017 12/27/16   Glade Lloyd, MD  labetalol (NORMODYNE) 300 MG tablet Take 1 tablet (300 mg total) by mouth 2 (two) times daily. Patient not taking: Reported on 02/16/2017 12/27/16   Glade Lloyd, MD  Multiple Vitamin (MULTIVITAMIN WITH MINERALS) TABS tablet Take 1 tablet by mouth daily. Patient not taking: Reported on 02/16/2017 12/27/16   Glade Lloyd, MD  thiamine 100 MG tablet Take 1 tablet (100 mg total) by mouth daily. Patient not taking: Reported on 07/23/2018 12/27/16   Glade Lloyd, MD    Current Facility-Administered Medications  Medication Dose Route Frequency Provider Last Rate Last Dose  . 0.9 %  sodium  chloride infusion   Intravenous Continuous Alwyn Ren, MD      . hydrALAZINE (APRESOLINE) injection 5 mg  5 mg Intravenous Q8H PRN Audrea Muscat T, NP   5 mg at 07/24/18 0834  . Influenza vac split quadrivalent PF (FLUARIX) injection 0.5 mL  0.5 mL Intramuscular Tomorrow-1000 Elder Love, MD      . LORazepam (ATIVAN) injection 0-4 mg  0-4 mg Intravenous Q6H Elder Love, MD   2 mg at 07/23/18 2300   Or  . LORazepam (ATIVAN) tablet 0-4 mg  0-4 mg Oral Q6H Elder Love, MD      . Melene Muller ON 07/25/2018] LORazepam (ATIVAN) injection 0-4 mg  0-4 mg Intravenous Q12H Elder Love, MD       Or  . Melene Muller ON 07/25/2018] LORazepam (ATIVAN) tablet 0-4 mg  0-4 mg Oral Q12H Elder Love, MD      . MEDLINE mouth rinse  15 mL Mouth Rinse BID Elder Love, MD   15 mL at 07/24/18 0323  . ondansetron (ZOFRAN) injection 4 mg  4 mg Intravenous Q6H PRN Elder Love, MD   4 mg at 07/24/18 0815  . pantoprazole (PROTONIX) 80 mg in sodium chloride 0.9 % 250 mL (0.32 mg/mL) infusion  8 mg/hr Intravenous Continuous Elder Love, MD 25 mL/hr at 07/24/18 0354 8 mg/hr at 07/24/18 0354  . pneumococcal 23 valent vaccine (PNU-IMMUNE) injection 0.5 mL  0.5 mL Intramuscular Tomorrow-1000 Elder Love, MD      . sodium chloride flush (NS) 0.9 % injection 3 mL  3 mL Intravenous Q12H Elder Love, MD   3 mL at 07/23/18 2306  . thiamine (VITAMIN B-1) tablet 100 mg  100 mg Oral Daily Elder Love, MD       Or  . thiamine (B-1) injection 100 mg  100 mg Intravenous Daily Elder Love, MD   100 mg at 07/23/18 1511    Allergies as of 07/23/2018  . (No Known Allergies)    History reviewed. No pertinent family history.  Social History   Socioeconomic History  . Marital status: Single    Spouse name: Not on file  . Number of children: Not on file  . Years of education: Not on file  . Highest education level: Not on file  Occupational History  . Not on  file  Social Needs  . Financial resource strain: Not on file  . Food insecurity:    Worry: Not on file    Inability: Not on file  . Transportation needs:    Medical: Not on file    Non-medical: Not on file  Tobacco Use  . Smoking status: Never Smoker  . Smokeless tobacco: Never Used  Substance and Sexual Activity  . Alcohol use: Yes    Alcohol/week: 14.0 standard drinks    Types: 14 Standard drinks or equivalent per week    Comment: Patient drank vodka yesterday  . Drug use: No  . Sexual activity: Not on file  Lifestyle  . Physical activity:  Days per week: Not on file    Minutes per session: Not on file  . Stress: Not on file  Relationships  . Social connections:    Talks on phone: Not on file    Gets together: Not on file    Attends religious service: Not on file    Active member of club or organization: Not on file    Attends meetings of clubs or organizations: Not on file    Relationship status: Not on file  . Intimate partner violence:    Fear of current or ex partner: Not on file    Emotionally abused: Not on file    Physically abused: Not on file    Forced sexual activity: Not on file  Other Topics Concern  . Not on file  Social History Narrative  . Not on file    Review of Systems: ROS is negative except as mentioned in HPI.  Physical Exam: Vital signs in last 24 hours: Temp:  [98.2 F (36.8 C)-99.5 F (37.5 C)] 98.6 F (37 C) (01/19 0800) Pulse Rate:  [107-134] 108 (01/19 0300) Resp:  [13-39] 20 (01/19 0200) BP: (119-165)/(56-109) 158/66 (01/19 0200) SpO2:  [87 %-100 %] 95 % (01/19 0200) Weight:  [104.3 kg] 104.3 kg (01/18 1358)   General:  Alert, Well-developed, well-nourished, pleasant and cooperative in NAD Head:  Normocephalic and atraumatic. Eyes:  Sclera clear, no icterus.  Conjunctiva pink. Ears:  Normal auditory acuity. Mouth:  No deformity or lesions.   Lungs:  Clear throughout to auscultation.  No wheezes, crackles, or rhonchi.    Heart:  Tachy.  No murmurs noted. Abdomen:  Soft, non-distended.  BS present.  Non-tender. Rectal:  Deferred  Msk:  Symmetrical without gross deformities. Pulses:  Normal pulses noted. Extremities:  Without clubbing or edema. Neurologic:  Alert and oriented x 4;  grossly normal neurologically. Skin:  Intact without significant lesions or rashes. Psych:  Alert and cooperative. Normal mood and affect.  Intake/Output from previous day: 01/18 0701 - 01/19 0700 In: 5574.1 [I.V.:1617.2; IV Piggyback:3956.9] Out: 850 [Urine:850] Intake/Output this shift: Total I/O In: -  Out: 400 [Urine:400]  Lab Results: Recent Labs    07/23/18 1532 07/23/18 2042 07/24/18 0252  WBC 9.7  --  5.9  HGB 15.1 13.2 11.1*  HCT 44.2 39.4 33.6*  PLT 232  --  164   BMET Recent Labs    07/23/18 1532 07/24/18 0252  NA 133* 138  K 3.7 3.7  CL 93* 101  CO2 22 23  GLUCOSE 111* 92  BUN 8 9  CREATININE 0.86 0.98  CALCIUM 8.5* 7.9*   LFT Recent Labs    07/24/18 0252  PROT 5.7*  ALBUMIN 3.6  AST 34  ALT 29  ALKPHOS 32*  BILITOT 1.0   PT/INR Recent Labs    07/23/18 1532  LABPROT 13.0  INR 0.99   Studies/Results: Ct Abdomen Pelvis W Contrast  Result Date: 07/23/2018 CLINICAL DATA:  100 mL isovue 300 given, tolerated well without complication or reaction No PO contrast per PA order, not tolerating liquids Pt with n/v, hematemesis, fever Symptoms x 3-4 days, no pain at this time Hx HTN Labs within normal range^176mL ISOVUE-300 IOPAMIDOL (ISOVUE-300) INJECTION 61%Hematemesis EXAM: CT ABDOMEN AND PELVIS WITH CONTRAST TECHNIQUE: Multidetector CT imaging of the abdomen and pelvis was performed using the standard protocol following bolus administration of intravenous contrast. CONTRAST:  ISOVUE-300 IOPAMIDOL (ISOVUE-300) INJECTION 61% COMPARISON:  None. FINDINGS: Lower chest: Moderate RIGHT effusion layers dependently in  the RIGHT hemithorax. Mild RIGHT basilar atelectasis. No pneumonitis or  pneumonia. Hepatobiliary: Hypodense regions along the falciform ligament and the gallbladder fossa most consistent fatty infiltration liver. Liver overall is low-attenuation which could indicate hepatic steatosis. Gallbladder normal. Normal common bile duct. Pancreas: Pancreas is normal. No ductal dilatation. No pancreatic inflammation. Spleen: Normal spleen Adrenals/urinary tract: Adrenal glands and kidneys are normal. The ureters and bladder normal. Stomach/Bowel: There is circumferential mural thickening of the distal esophagus from GE junction superiorly to the upper limits of the scan range (6 cm). Single wall thickness measures 9 mm (image 6/2). The stomach, duodenum, small-bowel appendix normal. The colon and rectosigmoid colon normal. Vascular/Lymphatic: Abdominal aorta is normal caliber. No periportal or retroperitoneal adenopathy. No pelvic adenopathy. Reproductive: Prostate normal Other: No free fluid. Musculoskeletal: No aggressive osseous lesion. IMPRESSION: 1. Long segment of circumferential mucosal thickening of the distal esophagus suggests esophagitis. Recommend clinical correlation and consider upper endoscopy. 2. Moderate layering RIGHT pleural effusion. 3. Potential hepatic steatosis and fatty infiltration along the gallbladder fossa and falciform ligament. Electronically Signed   By: Stewart  Edmunds M.D.   On: 07/23/2018 18:30   IMPRESSION:  *CGE, heme positive stools:  Several episodes of dark emesis.  Heme positive but no black or blood stools, actually no BM in several days.  CT scan suggests esophagitis. *Anemia:  Hgb down about 3-3.5 grams from baseline.  11.1 grams this AM. *ETOH intoxication/abuse:  Has hepatic steatosis on imaging but no signs of cirrhosis per se.  PLAN: *Continue PPI gtt for now. *Monitor Hgb and transfuse prn. *EGD this AM. *Monitor for DT's. *Needs ETOH cessation/abstinence and I expressed that to both the patient and his girlfriend.   Jessica D. Zehr   07/24/2018, 9:34 AM   ________________________________________________________________________  [facility] MD note:  I personally examined the patient, reviewed the data and agree with the assessment and plan described above.  Not sure if he's had any overt GI bleeding but with heme positive stool ("brown stool with flecks of black"), drop in Hb and subjective CG emesis it is probably safest to proceed with EGD. Planning for this AM   Daniel Jacobs, MD [facility] Pager [phone]   

## 2018-07-24 NOTE — Interval H&P Note (Signed)
History and Physical Interval Note:  07/24/2018 11:49 AM  Brett Molina  has presented today for surgery, with the diagnosis of Vomiting dark material, drop in Hgb  The various methods of treatment have been discussed with the patient and family. After consideration of risks, benefits and other options for treatment, the patient has consented to  Procedure(s): ESOPHAGOGASTRODUODENOSCOPY (EGD) (N/A) as a surgical intervention .  [name] history has been reviewed, patient examined, no change in status, stable for surgery.  I have reviewed the patient's chart and labs.  Questions were answered to the patient's satisfaction.     Rachael Fee

## 2018-07-24 NOTE — Op Note (Signed)
[facility] Patient Name: Brett JumboDerek Voris Procedure Date: 07/24/2018 MRN: [id number] Attending MD: Rachael Feeaniel P Beau Ramsburg , MD Date of Birth: 27-Jul-1990 CSN: [id number] Age: [age] Admit Type: Inpatient Procedure:                Upper GI endoscopy Indications:              Coffee-ground emesis Providers:                Rachael Feeaniel P. Kenith Trickel, MD, Bonney LeitzShanice Khonje, Lawson Radararlene                            Davis, Technician, Mirian MoKelley Carver, CRNA Referring MD:              Medicines:                Monitored Anesthesia Care Complications:            No immediate complications. Estimated blood loss:                            None. Estimated Blood Loss:     Estimated blood loss: none. Procedure:                Pre-Anesthesia Assessment:                           - Prior to the procedure, a History and Physical                            was performed, and patient medications and                            allergies were reviewed. [name] tolerance of                            previous anesthesia was also reviewed. The risks                            and benefits of the procedure and the sedation                            options and risks were discussed with the patient.                            All questions were answered, and informed consent                            was obtained. Prior Anticoagulants: The patient has                            taken no previous anticoagulant or antiplatelet                            agents. ASA Grade Assessment: II - A patient with  mild systemic disease. After reviewing the risks                            and benefits, the patient was deemed in                            satisfactory condition to undergo the procedure.                           After obtaining informed consent, the endoscope was                            passed under direct vision. Throughout the                            procedure, the patient's blood  pressure, pulse, and                            oxygen saturations were monitored continuously. The                            GIF-[id number] ([id number] Olympus gastroscope was                            introduced through the mouth, and advanced to the                            second part of duodenum. The upper GI endoscopy was                            accomplished without difficulty. The patient                            tolerated the procedure well. Scope In: Scope Out: Findings:      LA Grade B reflux related esophagitis.      The exam was otherwise without abnormality.      There was no blood in the UGI tract. Impression:               - Acid (reflux) related esophagitis. Moderate Sedation:      Not Applicable - Patient had care per Anesthesia. Recommendation:           - Continue alcohol detox.                           - Once daily PPI daily.                           - Please call or page with any further GI questions                            or concerns. Procedure Code(s):        --- Professional ---                           [phone], Esophagogastroduodenoscopy,  flexible,                            transoral; diagnostic, including collection of                            specimen(s) by brushing or washing, when performed                            (separate procedure) Diagnosis Code(s):        --- Professional ---                           K31.89, Other diseases of stomach and duodenum                           K92.0, Hematemesis CPT copyright 2018 American Medical Association. All rights reserved. The codes documented in this report are preliminary and upon coder review may  be revised to meet current compliance requirements. Rachael Fee, MD 07/24/2018 12:27:21 PM This report has been signed electronically. Number of Addenda: 0

## 2018-07-24 NOTE — Plan of Care (Signed)
  Problem: Education: Goal: Knowledge of General Education information will improve Description Including pain rating scale, medication(s)/side effects and non-pharmacologic comfort measures 07/24/2018 0036 by Marvel Plan, RN Outcome: Progressing 07/24/2018 0035 by Marvel Plan, RN Outcome: Progressing

## 2018-07-24 NOTE — Progress Notes (Signed)
CRITICAL VALUE ALERT  Critical Value:  Lactic Acid 2.3  Date & Time Notied:  07/24/18 0101  Provider Notified: X. Blount, NP  Orders Received/Actions taken: Awaiting New Orders

## 2018-07-24 NOTE — Transfer of Care (Signed)
Immediate Anesthesia Transfer of Care Note  Patient: Brett Molina  Procedure(s) Performed: ESOPHAGOGASTRODUODENOSCOPY (EGD) (N/A )  Patient Location: PACU  Anesthesia Type:MAC  Level of Consciousness: awake, alert  and oriented  Airway & Oxygen Therapy: Patient Spontanous Breathing and Patient connected to face mask oxygen  Post-op Assessment: Report given to RN and Post -op Vital signs reviewed and stable  Post vital signs: Reviewed and stable  Last Vitals:  Vitals Value Taken Time  BP 124/83 07/24/2018 12:22 PM  Temp    Pulse 120 07/24/2018 12:23 PM  Resp 26 07/24/2018 12:23 PM  SpO2 99 % 07/24/2018 12:23 PM  Vitals shown include unvalidated device data.  Last Pain:  Vitals:   07/24/18 1147  TempSrc: Oral  PainSc: 0-No pain         Complications: No apparent anesthesia complications

## 2018-07-24 NOTE — Anesthesia Postprocedure Evaluation (Signed)
Anesthesia Post Note  Patient: Fredderick Erb Monday  Procedure(s) Performed: ESOPHAGOGASTRODUODENOSCOPY (EGD) (N/A )     Patient location during evaluation: PACU Anesthesia Type: MAC Level of consciousness: awake and alert Pain management: pain level controlled Vital Signs Assessment: post-procedure vital signs reviewed and stable Respiratory status: spontaneous breathing, nonlabored ventilation and respiratory function stable Cardiovascular status: stable, blood pressure returned to baseline and tachycardic Anesthetic complications: no    Last Vitals:  Vitals:   07/24/18 1147 07/24/18 1224  BP: (!) 175/88 124/83  Pulse: (!) 109 (!) 116  Resp: (!) 24 (!) 21  Temp: 37.2 C 37.4 C  SpO2: 98% 99%    Last Pain:  Vitals:   07/24/18 1224  TempSrc: Oral  PainSc: 0-No pain                 Audry Pili

## 2018-07-25 ENCOUNTER — Encounter (HOSPITAL_COMMUNITY): Payer: Self-pay | Admitting: Gastroenterology

## 2018-07-25 LAB — COMPREHENSIVE METABOLIC PANEL
ALT: 29 U/L (ref 0–44)
AST: 36 U/L (ref 15–41)
Albumin: 3.8 g/dL (ref 3.5–5.0)
Alkaline Phosphatase: 32 U/L — ABNORMAL LOW (ref 38–126)
Anion gap: 9 (ref 5–15)
BUN: 8 mg/dL (ref 6–20)
CO2: 25 mmol/L (ref 22–32)
Calcium: 8.6 mg/dL — ABNORMAL LOW (ref 8.9–10.3)
Chloride: 102 mmol/L (ref 98–111)
Creatinine, Ser: 0.93 mg/dL (ref 0.61–1.24)
GFR calc Af Amer: >60 mL/min (ref >60)
GFR calc non Af Amer: >60 mL/min (ref >60)
Glucose, Bld: 101 mg/dL — ABNORMAL HIGH (ref 70–99)
POTASSIUM: 3.3 mmol/L — AB (ref 3.5–5.1)
Sodium: 136 mmol/L (ref 135–145)
TOTAL PROTEIN: 6.4 g/dL — AB (ref 6.5–8.1)
Total Bilirubin: 1.5 mg/dL — ABNORMAL HIGH (ref 0.3–1.2)

## 2018-07-25 LAB — CBC
HEMATOCRIT: 35.5 % — AB (ref 39.0–52.0)
Hemoglobin: 11.8 g/dL — ABNORMAL LOW (ref 13.0–17.0)
MCH: 32.8 pg (ref 26.0–34.0)
MCHC: 33.2 g/dL (ref 30.0–36.0)
MCV: 98.6 fL (ref 80.0–100.0)
Platelets: 179 10*3/uL (ref 150–400)
RBC: 3.6 MIL/uL — ABNORMAL LOW (ref 4.22–5.81)
RDW: 13.5 % (ref 11.5–15.5)
WBC: 5.3 10*3/uL (ref 4.0–10.5)
nRBC: 0 % (ref 0.0–0.2)

## 2018-07-25 MED ORDER — ADULT MULTIVITAMIN W/MINERALS CH
1.0000 | ORAL_TABLET | Freq: Every day | ORAL | Status: DC
  Administered 2018-07-25: 1 via ORAL
  Filled 2018-07-25: qty 1

## 2018-07-25 MED ORDER — HYDRALAZINE HCL 50 MG PO TABS
50.0000 mg | ORAL_TABLET | Freq: Three times a day (TID) | ORAL | Status: DC
  Administered 2018-07-25 (×2): 50 mg via ORAL
  Filled 2018-07-25 (×2): qty 1

## 2018-07-25 MED ORDER — LABETALOL HCL 300 MG PO TABS: 300.0000 mg | ORAL_TABLET | Freq: Two times a day (BID) | ORAL | 1 refills | Status: DC

## 2018-07-25 MED ORDER — PANTOPRAZOLE SODIUM 40 MG PO TBEC: 40.0000 mg | DELAYED_RELEASE_TABLET | Freq: Every day | ORAL | 1 refills | Status: DC

## 2018-07-25 MED ORDER — CHLORDIAZEPOXIDE HCL 25 MG PO CAPS: ORAL_CAPSULE | ORAL | 0 refills | Status: DC

## 2018-07-25 MED ORDER — LABETALOL HCL 200 MG PO TABS
300.0000 mg | ORAL_TABLET | Freq: Two times a day (BID) | ORAL | Status: DC
  Administered 2018-07-25: 300 mg via ORAL
  Filled 2018-07-25: qty 1

## 2018-07-25 MED ORDER — HYDRALAZINE HCL 25 MG PO TABS: 50.0000 mg | ORAL_TABLET | Freq: Three times a day (TID) | ORAL | 11 refills | Status: DC

## 2018-07-25 NOTE — Progress Notes (Signed)
Initial Nutrition Assessment  DOCUMENTATION CODES:   Not applicable  INTERVENTION:  - Continue to encourage PO intakes. - Will continue to monitor for nutrition-related needs.    NUTRITION DIAGNOSIS:   Inadequate oral intake related to acute illness, nausea, vomiting as evidenced by per patient/family report.  GOAL:   Patient will meet greater than or equal to 90% of their needs  MONITOR:   PO intake, Weight trends, Labs, I & O's  REASON FOR ASSESSMENT:   Malnutrition Screening Tool  ASSESSMENT:   28 y.o. male with medical problems including hepatic steatosis, alcohol misuse and HTN. He presented to the ED with complaints of vomiting dark material. He drinks alcohol 3-4 times weekly, and he reports drinking 10 ounces of gin on those occasions. Last drink was 07/23/18 at 1 PM when he drank a large bottle of wine. He reported attempting to self-medicate with alcohol.  BMI indicates overweight status. No intakes documented since admission. Patient had received lunch tray shortly before RD visit and had not had an opportunity to start meal. He denies any abdominal pain/pressure or nausea at this time.   He reports that for the past ~2 weeks he was having throat pain and was unsure what was causing it. He had upper endoscopy done 1/19 which showed esophagitis. Patient reports relief in knowing what is causing pain. He states that PTA he noticed alcohol and eating too much caused it to be worse. He states that he ate too much and began vomiting the day PTA and that that was the only time he had vomited.   Patient reports UBW of 235-240 lb and that he last weighed himself at home a few weeks ago, at which time he weighed 237 lb. He was unable to be more specific about time frame. Current weight is 230 lb and indicates 7 lb weight loss (3% body weight) in "a couple of weeks". Suspect this is likely not significant for time frame.   Medications reviewed; daily multivitamin with minerals,  40 mg oral protonix/day, 100 mg oral thiamine/day.  Labs reviewed; K: 3.3 mmol/l, Ca: 8.6 mg/dl.      NUTRITION - FOCUSED PHYSICAL EXAM:  Completed; no muscle and no fat wasting, no edema noted at this time.  Diet Order:   Diet Order            Diet Heart Room service appropriate? Yes; Fluid consistency: Thin  Diet effective now              EDUCATION NEEDS:   No education needs have been identified at this time  Skin:  Skin Assessment: Reviewed RN Assessment  Last BM:  1/20  Height:   Ht Readings from Last 1 Encounters:  07/24/18 6\' 2"  (1.88 m)    Weight:   Wt Readings from Last 1 Encounters:  07/24/18 104.3 kg    Ideal Body Weight:  86.36 kg  BMI:  Body mass index is 29.53 kg/m.  Estimated Nutritional Needs:   Kcal:  2100-2300 kcal  Protein:  100-110 grams  Fluid:  >/= 2.1 L/day     Trenton Gammon, MS, RD, LDN, [facility] Inpatient Clinical Dietitian Pager # [phone] After hours/weekend pager # [phone]

## 2018-07-25 NOTE — Progress Notes (Signed)
Pt is not Medicare, has [employer] no code 44 needed at this time.

## 2018-07-25 NOTE — Discharge Summary (Signed)
Physician Discharge Summary  Brett DredgeDerek Terrel Molina ZOX:[id number] DOB: Feb 06, 1991 DOA: 07/23/2018  PCP: Patient, No Pcp Per  Admit date: 07/23/2018 Discharge date: 07/25/2018  Admitted From HOME Disposition: HOME Recommendations for Outpatient Follow-up:  1. Follow up with PCP in 1-2 weeks 2. Please obtain BMP/CBC in one week  Home Health NONE Equipment/Devices NONE  Discharge Condition:STABLE CODE STATUS:FULL Diet recommendation CARDIAC Brief/Interim Summary: 28 year old man with medical problems including hepatic steatosis and alcohol misuse, and hypertension presenting with nausea and vomiting.  Patient reports onset of symptoms 4 days ago. Has been having inability to keep down any p.o. food or drink per his report. Emesis has been constant, at times having an appearance of coffee grounds. He denies melena, maroon stool, hematochezia. He does report a 10 pound unintentional weight loss over the past 3 months, decreased appetite, subjective fevers, and diaphoresis. He drinks alcohol per his report 3-4 times weekly, he reports drinking 10 ounces of gin on those occasions. Last drink was July 23, 2018 at 1 PM. He reports attempting to self medicate with alcohol.  He denies nonsteroidal anti-inflammatory medication use, denies the symptoms ever occurring before. No lightheadedness or dizziness, or syncope. He reports having 1 bowel movement a day, dark in color, no overt melena. Has never had an EGD.  At baseline, he works as an Airline pilotaccountant, does not smoke, [place]  ED Course:Prior to arrival on the stepdown unit, vital signs remarkable for tachycardia with a rate of 133, systolic blood pressure above 811140, diagnostics including CBC with hemoglobin of 15.1 with a repeat hemoglobin of 13.2. BMP with BUN and creatinine of 8 and 0.86 respectively. Alcohol detectable at 331 milligrams per deciliter. PT/INR normal. Lipase within normal months.  Fecal occult positive. CT of the abdomen pelvis was performed which revealed evidence of hepatic steatosis as well as distal esophageal thickening suggestive of esophagitis. Also was seen moderate layering right pleural effusion. Patient was given IV PPI followed by a Protonix drip, octreotide and Reglan. He was given 2 L normal saline and [facility] for further management  Discharge Diagnoses:  Active Problems:   Alcohol abuse   GI bleed   Upper GI bleeding   Intractable vomiting with nausea   Gastroesophageal reflux disease with esophagitis  #1 upper GI bleed patient presented with complaints of nausea and vomiting and coffee-ground emesis for few days prior to admission to hospital.  He was treated with IV fluids and IV Protonix. EGD reflux related esophagitis.CT scan shows esophagitis and hepatic steatosis.continue protonix.  #2 alcohol abuse upon admission his alcohol level was 330.  Patient and fianc does not want other family members to know that he drinks alcohol.  He is requesting that any family member to be removed from the room while talking about his health care.  Counseled about stopping alcohol intake.  Will DC him home on a tapering dose of Librium.    Nutrition Problem: Inadequate oral intake Etiology: acute illness, nausea, vomiting    Signs/Symptoms: per patient/family report     Interventions: Refer to RD note for recommendations  Estimated body mass index is 29.53 kg/m as calculated from the following:   Height as of this encounter: 6\' 2"  (1.88 m).   Weight as of this encounter: 104.3 kg.  Discharge Instructions  Discharge Instructions    Call MD for:  difficulty breathing, headache or visual disturbances   Complete by:  As directed    Call  MD for:  persistant nausea and vomiting   Complete by:  As directed    Call MD for:  severe uncontrolled pain   Complete by:  As directed    Diet - low sodium heart  healthy   Complete by:  As directed    Increase activity slowly   Complete by:  As directed      Allergies as of 07/25/2018   No Known Allergies     Medication List    STOP taking these medications   amLODipine 10 MG tablet Commonly known as:  NORVASC   gabapentin 300 MG capsule Commonly known as:  NEURONTIN     TAKE these medications   chlordiazePOXIDE 25 MG capsule Commonly known as:  LIBRIUM Take 1 tablet 3 times a day for 2 days then 1 tablet 2 times a day for 2 days and then 1 tablet daily till done. What changed:    additional instructions  Another medication with the same name was removed. Continue taking this medication, and follow the directions you see here.   folic acid 1 MG tablet Commonly known as:  FOLVITE Take 1 tablet (1 mg total) by mouth daily.   hydrALAZINE 25 MG tablet Commonly known as:  APRESOLINE Take 2 tablets (50 mg total) by mouth 3 (three) times daily for 30 days. What changed:    medication strength  when to take this   labetalol 300 MG tablet Commonly known as:  NORMODYNE Take 1 tablet (300 mg total) by mouth 2 (two) times daily.   multivitamin with minerals Tabs tablet Take 1 tablet by mouth daily.   pantoprazole 40 MG tablet Commonly known as:  PROTONIX Take 1 tablet (40 mg total) by mouth daily at 6 (six) AM. Start taking on:  July 26, 2018   thiamine 100 MG tablet Take 1 tablet (100 mg total) by mouth daily.       No Known Allergies  Consultations:  gi   Procedures/Studies: Ct Abdomen Pelvis W Contrast  Result Date: 07/23/2018 CLINICAL DATA:  100 mL isovue 300 given, tolerated well without complication or reaction No PO contrast per PA order, not tolerating liquids Pt with n/v, hematemesis, fever Symptoms x 3-4 days, no pain at this time Hx HTN Labs within normal range^11mL ISOVUE-300 IOPAMIDOL (ISOVUE-300) INJECTION 61%Hematemesis EXAM: CT ABDOMEN AND PELVIS WITH CONTRAST TECHNIQUE: Multidetector CT imaging of  the abdomen and pelvis was performed using the standard protocol following bolus administration of intravenous contrast. CONTRAST:  ISOVUE-300 IOPAMIDOL (ISOVUE-300) INJECTION 61% COMPARISON:  None. FINDINGS: Lower chest: Moderate RIGHT effusion layers dependently in the RIGHT hemithorax. Mild RIGHT basilar atelectasis. No pneumonitis or pneumonia. Hepatobiliary: Hypodense regions along the falciform ligament and the gallbladder fossa most consistent fatty infiltration liver. Liver overall is low-attenuation which could indicate hepatic steatosis. Gallbladder normal. Normal common bile duct. Pancreas: Pancreas is normal. No ductal dilatation. No pancreatic inflammation. Spleen: Normal spleen Adrenals/urinary tract: Adrenal glands and kidneys are normal. The ureters and bladder normal. Stomach/Bowel: There is circumferential mural thickening of the distal esophagus from GE junction superiorly to the upper limits of the scan range (6 cm). Single wall thickness measures 9 mm (image 6/2). The stomach, duodenum, small-bowel appendix normal. The colon and rectosigmoid colon normal. Vascular/Lymphatic: Abdominal aorta is normal caliber. No periportal or retroperitoneal adenopathy. No pelvic adenopathy. Reproductive: Prostate normal Other: No free fluid. Musculoskeletal: No aggressive osseous lesion. IMPRESSION: 1. Long segment of circumferential mucosal thickening of the distal esophagus suggests esophagitis. Recommend clinical correlation  and consider upper endoscopy. 2. Moderate layering RIGHT pleural effusion. 3. Potential hepatic steatosis and fatty infiltration along the gallbladder fossa and falciform ligament. Electronically Signed   By: Genevive Bi M.D.   On: 07/23/2018 18:30    (Echo, Carotid, EGD, Colonoscopy, ERCP)    Subjective:tolerating diet walked to bathroom    Discharge Exam: Vitals:   07/25/18 1200 07/25/18 1220  BP:  (!) 144/83  Pulse: 98 (!) 102  Resp: 18 18  Temp: 99.4 F  (37.4 C)   SpO2: 96% 97%   Vitals:   07/25/18 0900 07/25/18 1000 07/25/18 1200 07/25/18 1220  BP: (!) 160/83 138/78  (!) 144/83  Pulse: (!) 109 (!) 104 98 (!) 102  Resp: 16 20 18 18   Temp:   99.4 F (37.4 C)   TempSrc:   Oral   SpO2: 96% 94% 96% 97%  Weight:      Height:        General: Pt is alert, awake, not in acute distress Cardiovascular: RRR, S1/S2 +, no rubs, no gallops Respiratory: CTA bilaterally, no wheezing, no rhonchi Abdominal: Soft, NT, ND, bowel sounds + Extremities: no edema, no cyanosis    The results of significant diagnostics from this hospitalization (including imaging, microbiology, ancillary and laboratory) are listed below for reference.     Microbiology: Recent Results (from the past 240 hour(s))  MRSA PCR Screening     Status: None   Collection Time: 07/23/18  9:47 PM  Result Value Ref Range Status   MRSA by PCR NEGATIVE NEGATIVE Final    Comment:        The GeneXpert MRSA Assay (FDA approved for NASAL specimens only), is one component of a comprehensive MRSA colonization surveillance program. It is not intended to diagnose MRSA infection nor to guide or monitor treatment for MRSA infections. Performed at [facility], [address], [place], [place] [address]      Labs: BNP (last 3 results) No results for input(s): BNP in the last 8760 hours. Basic Metabolic Panel: Recent Labs  Lab 07/23/18 1532 07/24/18 0252 07/25/18 0754  NA 133* 138 136  K 3.7 3.7 3.3*  CL 93* 101 102  CO2 22 23 25   GLUCOSE 111* 92 101*  BUN 8 9 8   CREATININE 0.86 0.98 0.93  CALCIUM 8.5* 7.9* 8.6*   Liver Function Tests: Recent Labs  Lab 07/23/18 1532 07/24/18 0252 07/25/18 0754  AST 46* 34 36  ALT 40 29 29  ALKPHOS 46 32* 32*  BILITOT 0.7 1.0 1.5*  PROT 7.5 5.7* 6.4*  ALBUMIN 4.5 3.6 3.8   Recent Labs  Lab 07/23/18 1532  LIPASE 25   No results for input(s): AMMONIA in the last 168 hours. CBC: Recent Labs  Lab  07/23/18 1532 07/23/18 2042 07/24/18 0252 07/25/18 0754  WBC 9.7  --  5.9 5.3  NEUTROABS 7.4  --   --   --   HGB 15.1 13.2 11.1* 11.8*  HCT 44.2 39.4 33.6* 35.5*  MCV 94.0  --  100.0 98.6  PLT 232  --  164 179   Cardiac Enzymes: No results for input(s): CKTOTAL, CKMB, CKMBINDEX, TROPONINI in the last 168 hours. BNP: Invalid input(s): POCBNP CBG: No results for input(s): GLUCAP in the last 168 hours. D-Dimer No results for input(s): DDIMER in the last 72 hours. Hgb A1c No results for input(s): HGBA1C in the last 72 hours. Lipid Profile No results for input(s): CHOL, HDL, LDLCALC, TRIG, CHOLHDL, LDLDIRECT in the last  72 hours. Thyroid function studies No results for input(s): TSH, T4TOTAL, T3FREE, THYROIDAB in the last 72 hours.  Invalid input(s): FREET3 Anemia work up No results for input(s): VITAMINB12, FOLATE, FERRITIN, TIBC, IRON, RETICCTPCT in the last 72 hours. Urinalysis    Component Value Date/Time   COLORURINE AMBER (A) 07/23/2018 1941   APPEARANCEUR CLEAR 07/23/2018 1941   LABSPEC 1.025 07/23/2018 1941   PHURINE 6.0 07/23/2018 1941   GLUCOSEU NEGATIVE 07/23/2018 1941   HGBUR MODERATE (A) 07/23/2018 1941   BILIRUBINUR NEGATIVE 07/23/2018 1941   KETONESUR 40 (A) 07/23/2018 1941   PROTEINUR >300 (A) 07/23/2018 1941   NITRITE NEGATIVE 07/23/2018 1941   LEUKOCYTESUR NEGATIVE 07/23/2018 1941   Sepsis Labs Invalid input(s): PROCALCITONIN,  WBC,  LACTICIDVEN Microbiology Recent Results (from the past 240 hour(s))  MRSA PCR Screening     Status: None   Collection Time: 07/23/18  9:47 PM  Result Value Ref Range Status   MRSA by PCR NEGATIVE NEGATIVE Final    Comment:        The GeneXpert MRSA Assay (FDA approved for NASAL specimens only), is one component of a comprehensive MRSA colonization surveillance program. It is not intended to diagnose MRSA infection nor to guide or monitor treatment for MRSA infections. Performed at [facility],  [address], [place], [place] [address]      Time coordinating discharge:  33 minutes  SIGNED:   Alwyn Ren, MD  [facility] 07/25/2018, 2:35 PM Pager   If 7PM-7AM, please contact night-coverage [email or web address] Password [email or web address]

## 2018-08-10 ENCOUNTER — Emergency Department (HOSPITAL_COMMUNITY)
Admission: EM | Admit: 2018-08-10 | Discharge: 2018-08-10 | Disposition: A | Discharge status: Home / Self Care | Payer: Managed Care, Other (non HMO) | Attending: Emergency Medicine | Admitting: Emergency Medicine

## 2018-08-10 ENCOUNTER — Encounter (HOSPITAL_COMMUNITY): Payer: Self-pay

## 2018-08-10 DIAGNOSIS — K209 Esophagitis, unspecified without bleeding: Secondary | ICD-10-CM

## 2018-08-10 DIAGNOSIS — I10 Essential (primary) hypertension: Secondary | ICD-10-CM | POA: Insufficient documentation

## 2018-08-10 DIAGNOSIS — Z79899 Other long term (current) drug therapy: Secondary | ICD-10-CM | POA: Insufficient documentation

## 2018-08-10 LAB — CBC
HEMATOCRIT: 45.9 % (ref 39.0–52.0)
Hemoglobin: 15.3 g/dL (ref 13.0–17.0)
MCH: 32.3 pg (ref 26.0–34.0)
MCHC: 33.3 g/dL (ref 30.0–36.0)
MCV: 96.8 fL (ref 80.0–100.0)
PLATELETS: 413 10*3/uL — AB (ref 150–400)
RBC: 4.74 MIL/uL (ref 4.22–5.81)
RDW: 14.2 % (ref 11.5–15.5)
WBC: 5.5 10*3/uL (ref 4.0–10.5)
nRBC: 0 % (ref 0.0–0.2)

## 2018-08-10 LAB — URINALYSIS, ROUTINE W REFLEX MICROSCOPIC
BILIRUBIN URINE: NEGATIVE
Bacteria, UA: NONE SEEN
Glucose, UA: NEGATIVE mg/dL
Ketones, ur: 5 mg/dL — AB
Leukocytes, UA: NEGATIVE
Nitrite: NEGATIVE
Specific Gravity, Urine: 1.014 (ref 1.005–1.030)
pH: 6 (ref 5.0–8.0)

## 2018-08-10 LAB — COMPREHENSIVE METABOLIC PANEL
ALT: 41 U/L (ref 0–44)
AST: 48 U/L — ABNORMAL HIGH (ref 15–41)
Albumin: 5.5 g/dL — ABNORMAL HIGH (ref 3.5–5.0)
Alkaline Phosphatase: 59 U/L (ref 38–126)
Anion gap: 17 — ABNORMAL HIGH (ref 5–15)
BUN: <5 mg/dL — ABNORMAL LOW (ref 6–20)
CO2: 25 mmol/L (ref 22–32)
CREATININE: 0.82 mg/dL (ref 0.61–1.24)
Calcium: 9.1 mg/dL (ref 8.9–10.3)
Chloride: 101 mmol/L (ref 98–111)
GFR calc non Af Amer: >60 mL/min (ref >60)
Glucose, Bld: 122 mg/dL — ABNORMAL HIGH (ref 70–99)
Potassium: 3.6 mmol/L (ref 3.5–5.1)
Sodium: 143 mmol/L (ref 135–145)
Total Bilirubin: 0.5 mg/dL (ref 0.3–1.2)
Total Protein: 8.8 g/dL — ABNORMAL HIGH (ref 6.5–8.1)

## 2018-08-10 LAB — HEMOGLOBIN AND HEMATOCRIT, BLOOD
HCT: 45.1 % (ref 39.0–52.0)
Hemoglobin: 15.2 g/dL (ref 13.0–17.0)

## 2018-08-10 LAB — LIPASE, BLOOD: Lipase: 34 U/L (ref 11–51)

## 2018-08-10 MED ORDER — CHLORDIAZEPOXIDE HCL 25 MG PO CAPS: ORAL_CAPSULE | ORAL | 0 refills | Status: DC

## 2018-08-10 MED ORDER — LORAZEPAM 2 MG/ML IJ SOLN
1.0000 mg | Freq: Once | INTRAMUSCULAR | Status: AC
  Administered 2018-08-10: 1 mg via INTRAVENOUS
  Filled 2018-08-10: qty 1

## 2018-08-10 MED ORDER — SODIUM CHLORIDE 0.9 % IV BOLUS
1000.0000 mL | Freq: Once | INTRAVENOUS | Status: AC
  Administered 2018-08-10: 1000 mL via INTRAVENOUS

## 2018-08-10 MED ORDER — FAMOTIDINE 20 MG PO TABS: 20.0000 mg | ORAL_TABLET | Freq: Two times a day (BID) | ORAL | 0 refills | Status: DC

## 2018-08-10 MED ORDER — SODIUM CHLORIDE 0.9% FLUSH: 3.0000 mL | Freq: Once | INTRAVENOUS | Status: DC

## 2018-08-10 MED ORDER — PANTOPRAZOLE SODIUM 40 MG IV SOLR
40.0000 mg | Freq: Once | INTRAVENOUS | Status: AC
  Administered 2018-08-10: 40 mg via INTRAVENOUS
  Filled 2018-08-10: qty 40

## 2018-08-10 NOTE — Discharge Instructions (Addendum)
Mr. Zazueta, your blood counts have remained stable which is a good sign you are not actively bleeding. I have sent in a prescription to take twice daily to treat your esophagitis which should be more affordable.  I have also sent in Librium to help you stop drinking which you will take over the next 5 days.  I have included the numbers to call for a primary care doctor for you to establish care with, as well as the GI clinic that performed your endoscopy.

## 2018-08-10 NOTE — ED Triage Notes (Signed)
Pt BIBA from home. Pt c/o coffee ground emesis since 0400 today. Pt had similar 2 weeks ago. Pt does drink ETOH, last drink this morning, admits to 24 oz beer. Pt had upper GI endoscopy done and reflux esophagitis. VS WNL with EMS.

## 2018-08-10 NOTE — ED Provider Notes (Signed)
[facility]-EMERGENCY DEPT Provider Note   CSN: [id number] Arrival date & time: 08/10/18  1039     History   Chief Complaint Chief Complaint  Patient presents with  . Emesis    HPI Brett Molina is a 28 y.o. male with history HTN and EtOH abuse who presents for coffee ground emesis beginning yesterday. Also complains of subjective fevers. He was recently admitted for similar presentation. He underwent EGD which showed reflux related esophagitis without active bleeding. He was discharged on daily Protonix in addition to Librium to continue alcohol detox, but was unable to afford the medications.  Patient denies lightheadedness, chest pain, shortness of breath, palpitations, abdominal pain, melena, or hematochezia. He endorses "self-medicating" with a fifth of gin since symptom onset. Last drink was earlier this morning. Denies current withdrawal symptoms, but does not he gets the shakes if he goes a prolonged period of time.           Past Medical History:  Diagnosis Date  . ETOH abuse   . Hypertension     Patient Active Problem List   Diagnosis Date Noted  . Intractable vomiting with nausea   . Gastroesophageal reflux disease with esophagitis   . GI bleed 07/23/2018  . Upper GI bleeding 07/23/2018  . Alcohol abuse 02/18/2017  . Alcohol withdrawal (HCC) 02/16/2017  . Alcoholic intoxication without complication (HCC) 12/12/2016  . Hypokalemia 12/12/2016  . Alcoholic hepatitis without ascites 12/12/2016  . MDD (major depressive disorder) 12/12/2016    Past Surgical History:  Procedure Laterality Date  . ESOPHAGOGASTRODUODENOSCOPY N/A 07/24/2018   Procedure: ESOPHAGOGASTRODUODENOSCOPY (EGD);  Surgeon: Rachael Fee, MD;  Location: [facility];  Service: Endoscopy;  Laterality: N/A;        Home Medications    Prior to Admission medications   Medication Sig Start Date End Date Taking? Authorizing Provider  hydrALAZINE (APRESOLINE) 25 MG  tablet Take 2 tablets (50 mg total) by mouth 3 (three) times daily for 30 days. 07/25/18 08/24/18 Yes Alwyn Ren, MD  labetalol (NORMODYNE) 300 MG tablet Take 1 tablet (300 mg total) by mouth 2 (two) times daily. 07/25/18  Yes Alwyn Ren, MD  chlordiazePOXIDE (LIBRIUM) 25 MG capsule Take 1 tablet 3 times a day for 2 days then 1 tablet 2 times a day for 2 days and then 1 tablet daily till done. 08/10/18   Kadee Philyaw D, DO  famotidine (PEPCID) 20 MG tablet Take 1 tablet (20 mg total) by mouth 2 (two) times daily for 30 days. 08/10/18 09/09/18  Manley Fason, Karma Ganja D, DO  folic acid (FOLVITE) 1 MG tablet Take 1 tablet (1 mg total) by mouth daily. Patient not taking: Reported on 08/10/2018 12/27/16   Glade Lloyd, MD  Multiple Vitamin (MULTIVITAMIN WITH MINERALS) TABS tablet Take 1 tablet by mouth daily. Patient not taking: Reported on 02/16/2017 12/27/16   Glade Lloyd, MD  pantoprazole (PROTONIX) 40 MG tablet Take 1 tablet (40 mg total) by mouth daily at 6 (six) AM. Patient not taking: Reported on 08/10/2018 07/26/18   Alwyn Ren, MD  thiamine 100 MG tablet Take 1 tablet (100 mg total) by mouth daily. Patient not taking: Reported on 07/23/2018 12/27/16   Glade Lloyd, MD    Family History No family history on file.  Social History Social History   Tobacco Use  . Smoking status: Never Smoker  . Smokeless tobacco: Never Used  Substance Use Topics  . Alcohol use: Yes    Alcohol/week: 14.0 standard  drinks    Types: 14 Standard drinks or equivalent per week    Comment: Patient drank vodka yesterday  . Drug use: No     Allergies   Patient has no known allergies.   Review of Systems Review of Systems  All other systems reviewed and are negative.    Physical Exam Updated Vital Signs BP (!) 151/89 (BP Location: Right Arm)   Pulse 100   Temp 99 F (37.2 C) (Oral)   Resp 20   SpO2 99%   Physical Exam Constitutional:      General: He is not in acute  distress.    Appearance: Normal appearance.  HENT:     Head: Normocephalic and atraumatic.     Mouth/Throat:     Mouth: Mucous membranes are moist.     Pharynx: Oropharynx is clear.  Eyes:     Conjunctiva/sclera:     Right eye: Right conjunctiva is injected.     Left eye: Left conjunctiva is injected.  Neck:     Musculoskeletal: Normal range of motion.  Cardiovascular:     Rate and Rhythm: Regular rhythm. Tachycardia present.  Pulmonary:     Effort: Pulmonary effort is normal.     Breath sounds: Normal breath sounds.  Abdominal:     General: Bowel sounds are normal.     Palpations: Abdomen is soft.     Tenderness: There is no abdominal tenderness.  Musculoskeletal:        General: No swelling.  Lymphadenopathy:     Cervical: No cervical adenopathy.  Skin:    General: Skin is warm and dry.  Neurological:     Mental Status: He is alert and oriented to person, place, and time.     Comments: No tremors.   Psychiatric:        Mood and Affect: Mood normal.        Behavior: Behavior normal.      ED Treatments / Results  Labs (all labs ordered are listed, but only abnormal results are displayed) Labs Reviewed  COMPREHENSIVE METABOLIC PANEL - Abnormal; Notable for the following components:      Result Value   Glucose, Bld 122 (*)    BUN <5 (*)    Total Protein 8.8 (*)    Albumin 5.5 (*)    AST 48 (*)    Anion gap 17 (*)    All other components within normal limits  CBC - Abnormal; Notable for the following components:   Platelets 413 (*)    All other components within normal limits  URINALYSIS, ROUTINE W REFLEX MICROSCOPIC - Abnormal; Notable for the following components:   Hgb urine dipstick SMALL (*)    Ketones, ur 5 (*)    Protein, ur >=300 (*)    All other components within normal limits  LIPASE, BLOOD  HEMOGLOBIN AND HEMATOCRIT, BLOOD    EKG None  Radiology No results found.  Procedures Procedures (including critical care time)  Medications Ordered  in ED Medications  sodium chloride flush (NS) 0.9 % injection 3 mL (has no administration in time range)  sodium chloride 0.9 % bolus 1,000 mL (1,000 mLs Intravenous New Bag/Given 08/10/18 1653)  pantoprazole (PROTONIX) injection 40 mg (40 mg Intravenous Given 08/10/18 1654)  LORazepam (ATIVAN) injection 1 mg (1 mg Intravenous Given 08/10/18 1654)     Initial Impression / Assessment and Plan / ED Course  I have reviewed the triage vital signs and the nursing notes.  Pertinent labs & imaging results  that were available during my care of the patient were reviewed by me and considered in my medical decision making (see chart for details).   Patient is a 28 y/o male with history of EtOH abuse presenting with recurrent coffee ground emesis. Recently admitted on 1/18 for similar presentation. He underwent EGD which showed reflux related esophagitis without any active UGI bleeding. Was initiated on PPI, but unable to afford it. He has continued to drink, most recently early this morning. He attempted a Librium taper at home after last hospitalization, but did not like how it made him feel.  Patient is currently tachycardic but otherwise stable. No signs of active withdrawal. Will start IVF, Protonix, and give pre-emptive dose of ativan. Repeat H&H. Hgb 5 hours ago was 15.3. BUN is normal.      Final Clinical Impressions(s) / ED Diagnoses   Final diagnoses:  Esophagitis determined by endoscopy   Patient's blood counts have remained stable. No further emesis. Will prescribe Pepcid which should be more affordable and will send him home on Librium taper, as he is amenable to EtOH cessation.  He was provided information to establish care with PCP, as well as contact for [facility] to follow-up. He is tolerating PO and medically stable for discharge.   ED Discharge Orders         Ordered    famotidine (PEPCID) 20 MG tablet  2 times daily     08/10/18 1757    chlordiazePOXIDE (LIBRIUM) 25 MG capsule      08/10/18 1757           Lenward Chancellor D, DO 08/10/18 Silva Bandy    Jacalyn Lefevre, MD 08/10/18 1900

## 2018-08-13 ENCOUNTER — Other Ambulatory Visit: Payer: Self-pay

## 2018-08-13 ENCOUNTER — Encounter (HOSPITAL_BASED_OUTPATIENT_CLINIC_OR_DEPARTMENT_OTHER): Payer: Self-pay | Admitting: Emergency Medicine

## 2018-08-13 ENCOUNTER — Inpatient Hospital Stay (HOSPITAL_BASED_OUTPATIENT_CLINIC_OR_DEPARTMENT_OTHER)
Admission: EM | Admit: 2018-08-13 | Discharge: 2018-08-16 | DRG: 378 | Disposition: A | Discharge status: Home / Self Care | Payer: Self-pay | Attending: Internal Medicine | Admitting: Internal Medicine

## 2018-08-13 DIAGNOSIS — K2100 Gastro-esophageal reflux disease with esophagitis, without bleeding: Secondary | ICD-10-CM | POA: Diagnosis present

## 2018-08-13 DIAGNOSIS — K922 Gastrointestinal hemorrhage, unspecified: Secondary | ICD-10-CM | POA: Diagnosis present

## 2018-08-13 DIAGNOSIS — Z9114 Patient's other noncompliance with medication regimen: Secondary | ICD-10-CM

## 2018-08-13 DIAGNOSIS — K7010 Alcoholic hepatitis without ascites: Secondary | ICD-10-CM | POA: Diagnosis present

## 2018-08-13 DIAGNOSIS — Z79899 Other long term (current) drug therapy: Secondary | ICD-10-CM

## 2018-08-13 DIAGNOSIS — F10239 Alcohol dependence with withdrawal, unspecified: Secondary | ICD-10-CM | POA: Diagnosis present

## 2018-08-13 DIAGNOSIS — K920 Hematemesis: Secondary | ICD-10-CM

## 2018-08-13 DIAGNOSIS — E876 Hypokalemia: Secondary | ICD-10-CM | POA: Diagnosis present

## 2018-08-13 DIAGNOSIS — K2921 Alcoholic gastritis with bleeding: Principal | ICD-10-CM | POA: Diagnosis present

## 2018-08-13 DIAGNOSIS — K760 Fatty (change of) liver, not elsewhere classified: Secondary | ICD-10-CM | POA: Diagnosis present

## 2018-08-13 DIAGNOSIS — I10 Essential (primary) hypertension: Secondary | ICD-10-CM | POA: Diagnosis present

## 2018-08-13 DIAGNOSIS — F10939 Alcohol use, unspecified with withdrawal, unspecified: Secondary | ICD-10-CM | POA: Diagnosis present

## 2018-08-13 DIAGNOSIS — F419 Anxiety disorder, unspecified: Secondary | ICD-10-CM | POA: Diagnosis present

## 2018-08-13 LAB — CBC WITH DIFFERENTIAL/PLATELET
Abs Immature Granulocytes: 0.03 10*3/uL (ref 0.00–0.07)
Abs Immature Granulocytes: 0.03 10*3/uL (ref 0.00–0.07)
Basophils Absolute: 0.1 10*3/uL (ref 0.0–0.1)
Basophils Absolute: 0 10*3/uL (ref 0.0–0.1)
Basophils Relative: 1 %
Basophils Relative: 1 %
EOS ABS: 0 10*3/uL (ref 0.0–0.5)
Eosinophils Absolute: 0.2 10*3/uL (ref 0.0–0.5)
Eosinophils Relative: 2 %
Eosinophils Relative: 0 %
HCT: 43.2 % (ref 39.0–52.0)
HCT: 37 % — ABNORMAL LOW (ref 39.0–52.0)
Hemoglobin: 14.3 g/dL (ref 13.0–17.0)
Hemoglobin: 12.3 g/dL — ABNORMAL LOW (ref 13.0–17.0)
Immature Granulocytes: 0 %
Immature Granulocytes: 1 %
Lymphocytes Relative: 16 %
Lymphocytes Relative: 23 %
Lymphs Abs: 1.4 10*3/uL (ref 0.7–4.0)
Lymphs Abs: 1.4 10*3/uL (ref 0.7–4.0)
MCH: 31.8 pg (ref 26.0–34.0)
MCH: 33.1 pg (ref 26.0–34.0)
MCHC: 33.1 g/dL (ref 30.0–36.0)
MCHC: 33.2 g/dL (ref 30.0–36.0)
MCV: 96.2 fL (ref 80.0–100.0)
MCV: 99.5 fL (ref 80.0–100.0)
Monocytes Absolute: 0.5 10*3/uL (ref 0.1–1.0)
Monocytes Absolute: 0.5 10*3/uL (ref 0.1–1.0)
Monocytes Relative: 6 %
Monocytes Relative: 9 %
NRBC: 0 % (ref 0.0–0.2)
Neutro Abs: 6.3 10*3/uL (ref 1.7–7.7)
Neutro Abs: 4.1 10*3/uL (ref 1.7–7.7)
Neutrophils Relative %: 75 %
Neutrophils Relative %: 66 %
Platelets: 243 10*3/uL (ref 150–400)
Platelets: 178 10*3/uL (ref 150–400)
RBC: 4.49 MIL/uL (ref 4.22–5.81)
RBC: 3.72 MIL/uL — AB (ref 4.22–5.81)
RDW: 13.9 % (ref 11.5–15.5)
RDW: 14.2 % (ref 11.5–15.5)
WBC: 8.3 10*3/uL (ref 4.0–10.5)
WBC: 6.2 10*3/uL (ref 4.0–10.5)
nRBC: 0 % (ref 0.0–0.2)

## 2018-08-13 LAB — BASIC METABOLIC PANEL
ANION GAP: 14 (ref 5–15)
BUN: 5 mg/dL — ABNORMAL LOW (ref 6–20)
CO2: 25 mmol/L (ref 22–32)
Calcium: 8.4 mg/dL — ABNORMAL LOW (ref 8.9–10.3)
Chloride: 98 mmol/L (ref 98–111)
Creatinine, Ser: 0.88 mg/dL (ref 0.61–1.24)
GFR calc Af Amer: >60 mL/min (ref >60)
GFR calc non Af Amer: >60 mL/min (ref >60)
Glucose, Bld: 85 mg/dL (ref 70–99)
Potassium: 3.2 mmol/L — ABNORMAL LOW (ref 3.5–5.1)
Sodium: 137 mmol/L (ref 135–145)

## 2018-08-13 LAB — HEMOGLOBIN AND HEMATOCRIT, BLOOD
HCT: 39.1 % (ref 39.0–52.0)
Hemoglobin: 12.9 g/dL — ABNORMAL LOW (ref 13.0–17.0)

## 2018-08-13 LAB — COMPREHENSIVE METABOLIC PANEL
ALT: 26 U/L (ref 0–44)
ANION GAP: 19 — AB (ref 5–15)
AST: 37 U/L (ref 15–41)
Albumin: 4.8 g/dL (ref 3.5–5.0)
Alkaline Phosphatase: 68 U/L (ref 38–126)
BUN: 5 mg/dL — ABNORMAL LOW (ref 6–20)
CHLORIDE: 93 mmol/L — AB (ref 98–111)
CO2: 24 mmol/L (ref 22–32)
Calcium: 8.8 mg/dL — ABNORMAL LOW (ref 8.9–10.3)
Creatinine, Ser: 0.83 mg/dL (ref 0.61–1.24)
GFR calc Af Amer: >60 mL/min (ref >60)
GFR calc non Af Amer: >60 mL/min (ref >60)
Glucose, Bld: 125 mg/dL — ABNORMAL HIGH (ref 70–99)
Potassium: 3.5 mmol/L (ref 3.5–5.1)
Sodium: 136 mmol/L (ref 135–145)
Total Bilirubin: 0.9 mg/dL (ref 0.3–1.2)
Total Protein: 7.9 g/dL (ref 6.5–8.1)

## 2018-08-13 LAB — URINALYSIS, MICROSCOPIC (REFLEX)

## 2018-08-13 LAB — LIPASE, BLOOD: Lipase: 23 U/L (ref 11–51)

## 2018-08-13 LAB — URINALYSIS, ROUTINE W REFLEX MICROSCOPIC
Glucose, UA: NEGATIVE mg/dL
Ketones, ur: 15 mg/dL — AB
Leukocytes, UA: NEGATIVE
NITRITE: NEGATIVE
Protein, ur: >300 mg/dL — AB
Specific Gravity, Urine: >1.03 — ABNORMAL HIGH (ref 1.005–1.030)
pH: 6 (ref 5.0–8.0)

## 2018-08-13 LAB — OCCULT BLOOD X 1 CARD TO LAB, STOOL: Fecal Occult Bld: POSITIVE — AB

## 2018-08-13 LAB — LACTIC ACID, PLASMA: Lactic Acid, Venous: 2.1 mmol/L (ref 0.5–1.9)

## 2018-08-13 MED ORDER — LORAZEPAM 2 MG/ML IJ SOLN: 0.0000 mg | Freq: Two times a day (BID) | INTRAMUSCULAR | Status: DC

## 2018-08-13 MED ORDER — PANTOPRAZOLE SODIUM 40 MG IV SOLR
40.0000 mg | Freq: Once | INTRAVENOUS | Status: AC
  Administered 2018-08-13: 40 mg via INTRAVENOUS
  Filled 2018-08-13: qty 40

## 2018-08-13 MED ORDER — HYDRALAZINE HCL 25 MG PO TABS
50.0000 mg | ORAL_TABLET | Freq: Once | ORAL | Status: AC
  Administered 2018-08-13: 50 mg via ORAL
  Filled 2018-08-13: qty 2

## 2018-08-13 MED ORDER — SODIUM CHLORIDE 0.9 % IV BOLUS
1000.0000 mL | Freq: Once | INTRAVENOUS | Status: AC
  Administered 2018-08-13: 1000 mL via INTRAVENOUS

## 2018-08-13 MED ORDER — DEXTROSE-NACL 5-0.9 % IV SOLN
INTRAVENOUS | Status: DC
  Administered 2018-08-14 – 2018-08-16 (×5): via INTRAVENOUS

## 2018-08-13 MED ORDER — PROMETHAZINE HCL 25 MG/ML IJ SOLN
INTRAMUSCULAR | Status: AC
  Filled 2018-08-13: qty 1

## 2018-08-13 MED ORDER — PROMETHAZINE HCL 25 MG/ML IJ SOLN
25.0000 mg | Freq: Once | INTRAMUSCULAR | Status: AC
  Administered 2018-08-13: 25 mg via INTRAVENOUS

## 2018-08-13 MED ORDER — ONDANSETRON HCL 4 MG/2ML IJ SOLN
4.0000 mg | Freq: Four times a day (QID) | INTRAMUSCULAR | Status: DC | PRN
  Administered 2018-08-13: 4 mg via INTRAVENOUS
  Filled 2018-08-13: qty 2

## 2018-08-13 MED ORDER — PANTOPRAZOLE SODIUM 40 MG IV SOLR
40.0000 mg | Freq: Two times a day (BID) | INTRAVENOUS | Status: DC
  Administered 2018-08-13 – 2018-08-15 (×5): 40 mg via INTRAVENOUS
  Filled 2018-08-13 (×5): qty 40

## 2018-08-13 MED ORDER — THIAMINE HCL 100 MG/ML IJ SOLN
Freq: Once | INTRAVENOUS | Status: AC
  Administered 2018-08-13: 22:00:00 via INTRAVENOUS
  Filled 2018-08-13: qty 1000

## 2018-08-13 MED ORDER — LORAZEPAM 1 MG PO TABS
0.0000 mg | ORAL_TABLET | Freq: Four times a day (QID) | ORAL | Status: AC
  Administered 2018-08-15: 1 mg via ORAL
  Filled 2018-08-13: qty 1

## 2018-08-13 MED ORDER — SODIUM CHLORIDE 0.9 % IV BOLUS
500.0000 mL | Freq: Once | INTRAVENOUS | Status: AC
  Administered 2018-08-13: 500 mL via INTRAVENOUS

## 2018-08-13 MED ORDER — THIAMINE HCL 100 MG/ML IJ SOLN
100.0000 mg | Freq: Once | INTRAMUSCULAR | Status: AC
  Administered 2018-08-13: 100 mg via INTRAVENOUS
  Filled 2018-08-13: qty 2

## 2018-08-13 MED ORDER — ONDANSETRON HCL 4 MG PO TABS: 4.0000 mg | ORAL_TABLET | Freq: Four times a day (QID) | ORAL | Status: DC | PRN

## 2018-08-13 MED ORDER — LORAZEPAM 1 MG PO TABS: 0.0000 mg | ORAL_TABLET | Freq: Two times a day (BID) | ORAL | Status: DC

## 2018-08-13 MED ORDER — LORAZEPAM 2 MG/ML IJ SOLN
0.0000 mg | Freq: Four times a day (QID) | INTRAMUSCULAR | Status: AC
  Administered 2018-08-13 – 2018-08-14 (×5): 1 mg via INTRAVENOUS
  Filled 2018-08-13 (×5): qty 1

## 2018-08-13 MED ORDER — ACETAMINOPHEN 325 MG PO TABS
650.0000 mg | ORAL_TABLET | Freq: Once | ORAL | Status: AC
  Administered 2018-08-13: 650 mg via ORAL
  Filled 2018-08-13: qty 2

## 2018-08-13 MED ORDER — ONDANSETRON HCL 4 MG/2ML IJ SOLN
4.0000 mg | Freq: Once | INTRAMUSCULAR | Status: AC
  Administered 2018-08-13: 4 mg via INTRAVENOUS
  Filled 2018-08-13: qty 2

## 2018-08-13 NOTE — Progress Notes (Addendum)
[facility] Transfer acceptance note  Transferring provider: Leonia Corona, PA-C/EDP  28 y/o male, h/o alcohol dependence, HTN, Fatty liver, recent hospital admission 07/23/2018-07/25/2018 for nausea, vomiting and coffee ground emesis, [facility] consulted, underwent EGD that showed reflux esophagitis, now presented to [facility] with intractable nausea, vomiting, some coffee-ground emesis and intermittent melena, unable to tolerate p.o.  Symptoms ongoing since discharge.  Seen in ED for the second time in 3 days for similar complaints.  Reportedly not able to fill PPI and Librium prescriptions at recent discharge, ongoing alcohol use.  Reportedly not in distress.  Mildly tachycardic and hypertensive.  Rectal exam with normal colored stools but FOBT positive.  Hemoglobin 12.9, 11.8 at recent discharge so likely close to baseline.  CMP okay except anion gap 19.  Requested repeat BMP and a lactate level.  He has received IV fluids, IV Protonix.  Accepted transfer to [facility] telemetry bed, observation status.  Needs H&P and admit orders when he arrives to [facility].  I discussed with Dr. Marca Ancona, [facility]> on for unassigned at [facility] since patient had recent EGD, treat conservatively and consult GI if has worsening GI bleed as evidenced by significant hemoglobin drop or instability. Can call GI PRN.  Marcellus Scott, MD, FACP, [facility]. [facility]  To contact the attending provider between 7A-7P or the covering provider during after hours 7P-7A, please log into the web site [email or web address] and access using universal [place] password for that web site. If you do not have the password, please call the hospital operator.

## 2018-08-13 NOTE — H&P (Signed)
History and Physical   Brett Molina Current ZOX:[id number] DOB: [birth year] DOA: 08/13/2018  Referring MD/NP/PA: Dr. Melene Planan Molina  PCP: Patient, No Pcp Per   Outpatient Specialists: None  Patient coming from: [facility]  Chief Complaint: Nausea vomiting abdominal pain  HPI: Brett Molina is a 28 y.o. male with medical history significant of alcohol abuse, alcoholic hepatitis, recent alcoholic gastritis with melena status post EGD, who presented [facility] today complaining of intractable nausea vomiting with coffee-ground emesis.  Patient had a similar presentation only last week.  He was in the hospital and evaluated and found to have alcoholic gastritis.  He was discharged on January 28 after evaluation.  He went out and started drinking again.  Unable to stop alcohol intake and return to the ER today with same symptoms.  Was supposed to be on Protonix.  In the ER he was found to be tachycardic and hypertensive.  Patient was transferred there for to the [facility].  He is having epigastric pain is 6 out of 10.  Patient also shows signs of anxiety.  His last drink was yesterday.  Case discussed with Dr[facility] Marca [facility]ina of [facility].  ED Course: Temperature is 100 with blood pressure 131/98 pulse 141 respiratory 24 oxygen sats 100% room air.  Sodium 137 potassium is 3.2 chloride 98 is lactic acid 2.1 the rest of the chemistry within normal.  His hemoglobin is 12.9 which is about baseline.  Platelets are within normal.  Fecal occult blood testing was positive x2.  Patient was evaluated and now being admitted to the hospital for treatment  Review of Systems: As per HPI otherwise 10 point review of systems negative.    Past Medical History:  Diagnosis Date  . ETOH abuse   . Hypertension     Past Surgical History:  Procedure Laterality Date  . ESOPHAGOGASTRODUODENOSCOPY N/A 07/24/2018   Procedure: ESOPHAGOGASTRODUODENOSCOPY (EGD);  Surgeon: Brett Molina,  Brett P, MD;  [facility];  Service: Endoscopy;  Laterality: N/A;     reports that he has never smoked. He has never used smokeless tobacco. He reports current alcohol use of about 14.0 standard drinks of alcohol per week. He reports that he does not use drugs.  No Known Allergies  No family history on file.   Prior to Admission medications   Medication Sig Start Date End Date Taking? Authorizing Provider  chlordiazePOXIDE (LIBRIUM) 25 MG capsule Take 1 tablet 3 times a day for 2 days then 1 tablet 2 times a day for 2 days and then 1 tablet daily till done. 08/10/18   Molina, Brett D, DO  famotidine (PEPCID) 20 MG tablet Take 1 tablet (20 mg total) by mouth 2 (two) times daily for 30 days. 08/10/18 09/09/18  Molina, Brett Ganjaarley D, DO  folic acid (FOLVITE) 1 MG tablet Take 1 tablet (1 mg total) by mouth daily. Patient not taking: Reported on 08/10/2018 12/27/16   Brett Molina, Kshitiz, MD  hydrALAZINE (APRESOLINE) 25 MG tablet Take 2 tablets (50 mg total) by mouth 3 (three) times daily for 30 days. 07/25/18 08/24/18  Brett Molina, Brett G, MD  labetalol (NORMODYNE) 300 MG tablet Take 1 tablet (300 mg total) by mouth 2 (two) times daily. 07/25/18   Brett Molina, Brett G, MD  Multiple Vitamin (MULTIVITAMIN WITH MINERALS) TABS tablet Take 1 tablet by mouth daily. Patient not taking: Reported on 02/16/2017 12/27/16   Brett Molina, Kshitiz, MD  pantoprazole (PROTONIX) 40 MG tablet Take 1 tablet (  40 mg total) by mouth daily at 6 (six) AM. Patient not taking: Reported on 08/10/2018 07/26/18   Brett Ren, MD  thiamine 100 MG tablet Take 1 tablet (100 mg total) by mouth daily. Patient not taking: Reported on 07/23/2018 12/27/16   Brett Lloyd, MD    Physical Exam: Vitals:   08/13/18 1900 08/13/18 1930 08/13/18 1950 08/13/18 2039  BP: (!) 151/90 (!) 144/79 (!) 143/94 (!) 153/92  Pulse: (!) 121 (!) 117 (!) 123 91  Resp: 15 (!) 24 16 16   Temp:    100 F (37.8 C)  TempSrc:    Oral  SpO2: 98% 94% 100%  98%  Weight:      Height:          Constitutional: NAD, calm, comfortable, tremulous Vitals:   08/13/18 1900 08/13/18 1930 08/13/18 1950 08/13/18 2039  BP: (!) 151/90 (!) 144/79 (!) 143/94 (!) 153/92  Pulse: (!) 121 (!) 117 (!) 123 91  Resp: 15 (!) 24 16 16   Temp:    100 F (37.8 C)  TempSrc:    Oral  SpO2: 98% 94% 100% 98%  Weight:      Height:       Eyes: PERRL, lids and conjunctivae normal ENMT: Mucous membranes are moist. Posterior pharynx clear of any exudate or lesions.Normal dentition.  Neck: normal, supple, no masses, no thyromegaly Respiratory: clear to auscultation bilaterally, no wheezing, no crackles. Normal respiratory effort. No accessory muscle use.  Cardiovascular: Regular rate and rhythm, no murmurs / rubs / gallops. No extremity edema. 2+ pedal pulses. No carotid bruits.  Abdomen: diffuse tenderness, no masses palpated. No hepatosplenomegaly. Bowel sounds positive.  Musculoskeletal: no clubbing / cyanosis. No joint deformity upper and lower extremities. Good ROM, no contractures. Normal muscle tone.  Skin: no rashes, lesions, ulcers. No induration Neurologic: CN 2-12 grossly intact. Sensation intact, DTR normal. Strength 5/5 in all 4.  Psychiatric: Normal judgment and insight. Alert and oriented x 3. Normal mood.     Labs on Admission: I have personally reviewed following labs and imaging studies  CBC: Recent Labs  Lab 08/10/18 1142 08/10/18 1631 08/13/18 1242 08/13/18 1625  WBC 5.5  --  8.3  --   NEUTROABS  --   --  6.3  --   HGB 15.3 15.2 14.3 12.9*  HCT 45.9 45.1 43.2 39.1  MCV 96.8  --  96.2  --   PLT 413*  --  243  --    Basic Metabolic Panel: Recent Labs  Lab 08/10/18 1142 08/13/18 1242 08/13/18 1812  NA 143 136 137  K 3.6 3.5 3.2*  CL 101 93* 98  CO2 25 24 25   GLUCOSE 122* 125* 85  BUN <5* 5* 5*  CREATININE 0.82 0.83 0.88  CALCIUM 9.1 8.8* 8.4*   GFR: Estimated Creatinine Clearance: 156.4 mL/min (by C-G formula based on SCr of  0.88 mg/dL). Liver Function Tests: Recent Labs  Lab 08/10/18 1142 08/13/18 1242  AST 48* 37  ALT 41 26  ALKPHOS 59 68  BILITOT 0.5 0.9  PROT 8.8* 7.9  ALBUMIN 5.5* 4.8   Recent Labs  Lab 08/10/18 1142 08/13/18 1242  LIPASE 34 23   No results for input(s): AMMONIA in the last 168 hours. Coagulation Profile: No results for input(s): INR, PROTIME in the last 168 hours. Cardiac Enzymes: No results for input(s): CKTOTAL, CKMB, CKMBINDEX, TROPONINI in the last 168 hours. BNP (last 3 results) No results for input(s): PROBNP in the last 8760 hours. HbA1C:  No results for input(s): HGBA1C in the last 72 hours. CBG: No results for input(s): GLUCAP in the last 168 hours. Lipid Profile: No results for input(s): CHOL, HDL, LDLCALC, TRIG, CHOLHDL, LDLDIRECT in the last 72 hours. Thyroid Function Tests: No results for input(s): TSH, T4TOTAL, FREET4, T3FREE, THYROIDAB in the last 72 hours. Anemia Panel: No results for input(s): VITAMINB12, FOLATE, FERRITIN, TIBC, IRON, RETICCTPCT in the last 72 hours. Urine analysis:    Component Value Date/Time   COLORURINE AMBER (A) 08/13/2018 1310   APPEARANCEUR CLEAR 08/13/2018 1310   LABSPEC >1.030 (H) 08/13/2018 1310   PHURINE 6.0 08/13/2018 1310   GLUCOSEU NEGATIVE 08/13/2018 1310   HGBUR MODERATE (A) 08/13/2018 1310   BILIRUBINUR SMALL (A) 08/13/2018 1310   KETONESUR 15 (A) 08/13/2018 1310   PROTEINUR >300 (A) 08/13/2018 1310   NITRITE NEGATIVE 08/13/2018 1310   LEUKOCYTESUR NEGATIVE 08/13/2018 1310   Sepsis Labs: @LABRCNTIP (procalcitonin:4,lacticidven:4) )No results found for this or any previous visit (from the past 240 hour(s)).   Radiological Exams on Admission: No results found.   Assessment/Plan Principal Problem:   UGIB (upper gastrointestinal bleed) Active Problems:   Hypokalemia   Alcoholic hepatitis without ascites   Alcohol withdrawal (HCC)   Gastroesophageal reflux disease with esophagitis     #1 intractable  nausea vomiting with upper GI bleed: Secondary to alcoholism.  Most likely alcoholic gastritis.  Patient will be admitted and started on Protonix IV.  Supportive care with IV fluids.  GI has been consulted to follow with patient and make further recommendations.  No evidence of varices during last EGD.  #2 alcohol abuse with possible withdrawal: CIWA protocol activated.  Thiamine folic acid initiated.  Alcohol cessation counseling given.  #3 hypokalemia: Replete potassium.  Most likely secondary to intractable nausea vomiting.  #4 GERD: Again continue with PPIs.   DVT prophylaxis: SCD Code Status: Full code Family Communication: Girlfriend with patient in the room Disposition Plan: To be determined Consults called: [facility] Dr. Marca Ancona Admission status: Inpatient  Severity of Illness: The appropriate patient status for this patient is INPATIENT. Inpatient status is judged to be reasonable and necessary in order to provide the required intensity of service to ensure the patient's safety. [name] presenting symptoms, physical exam findings, and initial radiographic and laboratory data in the context of their chronic comorbidities is felt to place them at high risk for further clinical deterioration. Furthermore, it is not anticipated that the patient will be medically stable for discharge from the hospital within 2 midnights of admission. The following factors support the patient status of inpatient.   " [name] presenting symptoms include nausea vomiting was coffee-ground emesis. " The worrisome physical exam findings include abdominal tenderness. " The initial radiographic and laboratory data are worrisome because of guaiac positive stools. " The chronic co-morbidities include chronic alcohol abuse.   * I certify that at the point of admission it is my clinical judgment that the patient will require inpatient hospital care spanning beyond 2 midnights from the point of admission due  to high intensity of service, high risk for further deterioration and high frequency of surveillance required.Lonia Blood MD [facility] Pager [phone]  If 7PM-7AM, please contact night-coverage [email or web address] Password [facility]  08/13/2018, 8:43 PM

## 2018-08-13 NOTE — ED Triage Notes (Addendum)
Vomiting for 1 month. Was seen here and dx with esophagitis. He states he was given medication but cannot keep it down. History of alcohol abuse, last drink 24 hours ago.

## 2018-08-13 NOTE — ED Notes (Signed)
Pt did not tolerate PO challenge. Vomiting. EDP made aware. Orders pending

## 2018-08-13 NOTE — ED Notes (Signed)
Patient verbalizes understanding of discharge instructions. Opportunity for questioning and answers were provided. Armband removed by staff, pt discharged from ED.  

## 2018-08-13 NOTE — ED Provider Notes (Addendum)
[facility] EMERGENCY DEPARTMENT Provider Note   CSN: [id number] Arrival date & time: 08/13/18  1145     History   Chief Complaint Chief Complaint  Patient presents with  . Emesis    HPI Brett Molina is a 28 y.o. male.  HPI   Pt is a 28 y/o male with a h/o ETOH abuse, HTN, esophagitis, who presents to the ED today for evaluation of nausea and vomiting that began 1 month ago. He states that some of the vomit appears black and "looks like grains are in it".  Vomit has not looked like this since yesterday.  He has tried taking Pepcid without relief.  He denies abd pain, diarrhea, constipation. He states his stools intermittently dark. States he started developing fevers about 2 weeks ago. Reports mild nasal congestion. No cough or sob. No urinary sxs.  Has had subjective fevers.  He states he has been using alcohol to help with vomiting and dry heaving. He states he has been drinking about 50 oz wine daily. He has drank this much ETOH daily for the last month. Last drink was last night at 9pm.  He was admitted earlier this year in January for similar hesitation and had EGD which showed esophagitis without other abnormality or active bleeding.  He was seen again 3 days ago for similar symptoms.  Symptoms and vital signs improved after IV fluids antiemetics and he was discharged with a prescription for Pepcid and Librium.  Reports complicated h/o ETOH withdrawal.  He denies headaches, anxiety, auditory or tactile hallucinations.  Denies illicit drug use.  Past Medical History:  Diagnosis Date  . ETOH abuse   . Hypertension     Patient Active Problem List   Diagnosis Date Noted  . UGIB (upper gastrointestinal bleed) 08/13/2018  . Intractable vomiting with nausea   . Gastroesophageal reflux disease with esophagitis   . GI bleed 07/23/2018  . Upper GI bleeding 07/23/2018  . Alcohol abuse 02/18/2017  . Alcohol withdrawal (HCC) 02/16/2017  . Alcoholic intoxication  without complication (HCC) 12/12/2016  . Hypokalemia 12/12/2016  . Alcoholic hepatitis without ascites 12/12/2016  . MDD (major depressive disorder) 12/12/2016    Past Surgical History:  Procedure Laterality Date  . ESOPHAGOGASTRODUODENOSCOPY N/A 07/24/2018   Procedure: ESOPHAGOGASTRODUODENOSCOPY (EGD);  Surgeon: Rachael Fee, MD;  Location: [facility];  Service: Endoscopy;  Laterality: N/A;        Home Medications    Prior to Admission medications   Medication Sig Start Date End Date Taking? Authorizing Provider  chlordiazePOXIDE (LIBRIUM) 25 MG capsule Take 1 tablet 3 times a day for 2 days then 1 tablet 2 times a day for 2 days and then 1 tablet daily till done. 08/10/18   Bloomfield, Carley D, DO  famotidine (PEPCID) 20 MG tablet Take 1 tablet (20 mg total) by mouth 2 (two) times daily for 30 days. 08/10/18 09/09/18  Bloomfield, Karma Ganja D, DO  folic acid (FOLVITE) 1 MG tablet Take 1 tablet (1 mg total) by mouth daily. Patient not taking: Reported on 08/10/2018 12/27/16   Glade Lloyd, MD  hydrALAZINE (APRESOLINE) 25 MG tablet Take 2 tablets (50 mg total) by mouth 3 (three) times daily for 30 days. 07/25/18 08/24/18  Alwyn Ren, MD  labetalol (NORMODYNE) 300 MG tablet Take 1 tablet (300 mg total) by mouth 2 (two) times daily. 07/25/18   Alwyn Ren, MD  Multiple Vitamin (MULTIVITAMIN WITH MINERALS) TABS tablet Take 1 tablet by mouth daily. Patient not  taking: Reported on 02/16/2017 12/27/16   Glade Lloyd, MD  pantoprazole (PROTONIX) 40 MG tablet Take 1 tablet (40 mg total) by mouth daily at 6 (six) AM. Patient not taking: Reported on 08/10/2018 07/26/18   Alwyn Ren, MD  thiamine 100 MG tablet Take 1 tablet (100 mg total) by mouth daily. Patient not taking: Reported on 07/23/2018 12/27/16   Glade Lloyd, MD    Family History No family history on file.  Social History Social History   Tobacco Use  . Smoking status: Never Smoker  . Smokeless tobacco:  Never Used  Substance Use Topics  . Alcohol use: Yes    Alcohol/week: 14.0 standard drinks    Types: 14 Standard drinks or equivalent per week    Comment: Patient drank vodka yesterday  . Drug use: No     Allergies   Patient has no known allergies.   Review of Systems Review of Systems  Constitutional: Positive for chills and diaphoresis.  HENT: Negative for congestion and rhinorrhea.   Eyes: Negative for visual disturbance.  Respiratory: Negative for cough and shortness of breath.   Cardiovascular: Negative for chest pain.  Gastrointestinal: Positive for nausea and vomiting. Negative for abdominal pain, blood in stool, constipation and diarrhea.  Genitourinary: Negative for dysuria, flank pain and urgency.  Musculoskeletal: Negative for myalgias.  Skin: Negative for rash.  Neurological: Negative for headaches.   Physical Exam Updated Vital Signs BP (!) 148/92   Pulse (!) 104   Temp 99.9 F (37.7 C) (Oral)   Resp 17   Ht 6\' 2"  (1.88 m)   Wt 102 kg   SpO2 98%   BMI 28.87 kg/m   Physical Exam Vitals signs and nursing note reviewed.  Constitutional:      Appearance: He is well-developed.  HENT:     Head: Normocephalic and atraumatic.     Nose: Congestion present.     Mouth/Throat:     Mouth: Mucous membranes are dry.  Eyes:     Conjunctiva/sclera: Conjunctivae normal.  Neck:     Musculoskeletal: Neck supple.  Cardiovascular:     Rate and Rhythm: Normal rate and regular rhythm.     Heart sounds: No murmur.  Pulmonary:     Effort: Pulmonary effort is normal. No respiratory distress.     Breath sounds: Normal breath sounds. No stridor. No wheezing or rhonchi.  Abdominal:     General: Bowel sounds are normal. There is no distension.     Palpations: Abdomen is soft.     Tenderness: There is no abdominal tenderness. There is no right CVA tenderness, left CVA tenderness, guarding or rebound.  Genitourinary:    Comments: Digital rectal exam performed with  chaperone present.  No gross melena noted.  Stool is light brown. Skin:    General: Skin is warm and dry.  Neurological:     Mental Status: He is alert.      ED Treatments / Results  Labs (all labs ordered are listed, but only abnormal results are displayed) Labs Reviewed  COMPREHENSIVE METABOLIC PANEL - Abnormal; Notable for the following components:      Result Value   Chloride 93 (*)    Glucose, Bld 125 (*)    BUN 5 (*)    Calcium 8.8 (*)    Anion gap 19 (*)    All other components within normal limits  URINALYSIS, ROUTINE W REFLEX MICROSCOPIC - Abnormal; Notable for the following components:   Color, Urine AMBER (*)  Specific Gravity, Urine >1.030 (*)    Hgb urine dipstick MODERATE (*)    Bilirubin Urine SMALL (*)    Ketones, ur 15 (*)    Protein, ur >300 (*)    All other components within normal limits  OCCULT BLOOD X 1 CARD TO LAB, STOOL - Abnormal; Notable for the following components:   Fecal Occult Bld POSITIVE (*)    All other components within normal limits  URINALYSIS, MICROSCOPIC (REFLEX) - Abnormal; Notable for the following components:   Bacteria, UA FEW (*)    All other components within normal limits  HEMOGLOBIN AND HEMATOCRIT, BLOOD - Abnormal; Notable for the following components:   Hemoglobin 12.9 (*)    All other components within normal limits  LACTIC ACID, PLASMA - Abnormal; Notable for the following components:   Lactic Acid, Venous 2.1 (*)    All other components within normal limits  BASIC METABOLIC PANEL - Abnormal; Notable for the following components:   Potassium 3.2 (*)    BUN 5 (*)    Calcium 8.4 (*)    All other components within normal limits  CBC WITH DIFFERENTIAL/PLATELET  LIPASE, BLOOD  LACTIC ACID, PLASMA    EKG EKG Interpretation  Date/Time:  Saturday August 13 2018 12:00:18 EST Ventricular Rate:  103 PR Interval:    QRS Duration: 93 QT Interval:  334 QTC Calculation: 438 R Axis:   53 Text Interpretation:  Sinus  tachycardia ST elev, probable normal early repol pattern tachycardia is less compared to Jan 2020 Confirmed by Pricilla LovelessGoldston, Scott [phone]([id number]) on 08/13/2018 12:11:55 PM   Radiology No results found.  Procedures Procedures (including critical care time)  Medications Ordered in ED Medications  LORazepam (ATIVAN) injection 0-4 mg (1 mg Intravenous Given 08/13/18 1542)    Or  LORazepam (ATIVAN) tablet 0-4 mg ( Oral See Alternative 08/13/18 1542)  LORazepam (ATIVAN) injection 0-4 mg (has no administration in time range)    Or  LORazepam (ATIVAN) tablet 0-4 mg (has no administration in time range)  pantoprazole (PROTONIX) injection 40 mg (40 mg Intravenous Given 08/13/18 1258)  sodium chloride 0.9 % bolus 1,000 mL ( Intravenous Stopped 08/13/18 1409)  ondansetron (ZOFRAN) injection 4 mg (4 mg Intravenous Given 08/13/18 1258)  thiamine (B-1) injection 100 mg (100 mg Intravenous Given 08/13/18 1258)  hydrALAZINE (APRESOLINE) tablet 50 mg (50 mg Oral Given 08/13/18 1258)  promethazine (PHENERGAN) injection 25 mg (25 mg Intravenous Given 08/13/18 1444)  acetaminophen (TYLENOL) tablet 650 mg (650 mg Oral Given 08/13/18 1624)     Initial Impression / Assessment and Plan / ED Course  I have reviewed the triage vital signs and the nursing notes.  Pertinent labs & imaging results that were available during my care of the patient were reviewed by me and considered in my medical decision making (see chart for details).      Final Clinical Impressions(s) / ED Diagnoses   Final diagnoses:  Hematemesis, presence of nausea not specified   Pt presenting with NV and reported episode of coffee ground emesis yesterday. None today. Tachycardic and hypertensive here. Did not take BP medications.   Abdomen is soft and nontender. DRE without melena or gross blood.   CBC is without leukocytosis.  Hemoglobin is stable from prior.  CMP with slightly low chloride, normal BUN and creatinine.  Elevated anion gap at 19, appears  consistent with his prior labs.  Lipase is negative.  UA with hematuria, ketonuria, and proteinuria.   Fecal occult blood is positive. Repeat H&H  shows decrease in hgb since prior study today and dropped by about 2 points since he was seen 3 days ago.   Given pts findings of decreased hgb on lab work in setting of reported hematemesis and positive FOB and due his intractable NV, will admit for observation. Dose of protonix given in the ED.   Consulted with hospitast service, Dr. Waymon Amato, who accepts patient for admission. He recommends ordering stat lactic acid and repeat BMP.   ED Discharge Orders    None       Karrie Meres, PA-C 08/13/18 1844    Karrie Meres, PA-C 08/13/18 1845    [name], DO 08/13/18 2107

## 2018-08-13 NOTE — ED Notes (Signed)
Pt girlfriend at bedside- pt resting on stretcher and appears to be in no acute distress.

## 2018-08-13 NOTE — ED Notes (Signed)
Date and time results received: 08/13/18 1841   Test: lactic acid Critical Value: 2.1 Name of Provider Notified: Cortni, PA and Theora Gianotti, RN Orders Received? Or Actions Taken?: no orders given

## 2018-08-13 NOTE — ED Notes (Signed)
[facility] at bedside 

## 2018-08-13 NOTE — ED Notes (Signed)
Pt given sprite per request / PO challenge.

## 2018-08-14 LAB — CBC WITH DIFFERENTIAL/PLATELET
ABS IMMATURE GRANULOCYTES: 0.01 10*3/uL (ref 0.00–0.07)
Abs Immature Granulocytes: 0.03 10*3/uL (ref 0.00–0.07)
Abs Immature Granulocytes: 0.03 10*3/uL (ref 0.00–0.07)
Basophils Absolute: 0 10*3/uL (ref 0.0–0.1)
Basophils Absolute: 0 10*3/uL (ref 0.0–0.1)
Basophils Absolute: 0 10*3/uL (ref 0.0–0.1)
Basophils Relative: 1 %
Basophils Relative: 0 %
Basophils Relative: 0 %
Eosinophils Absolute: 0 10*3/uL (ref 0.0–0.5)
Eosinophils Absolute: 0 10*3/uL (ref 0.0–0.5)
Eosinophils Absolute: 0 10*3/uL (ref 0.0–0.5)
Eosinophils Relative: 0 %
Eosinophils Relative: 0 %
Eosinophils Relative: 0 %
HCT: 37.1 % — ABNORMAL LOW (ref 39.0–52.0)
HCT: 37.2 % — ABNORMAL LOW (ref 39.0–52.0)
HCT: 36.6 % — ABNORMAL LOW (ref 39.0–52.0)
HEMOGLOBIN: 12.2 g/dL — AB (ref 13.0–17.0)
Hemoglobin: 12 g/dL — ABNORMAL LOW (ref 13.0–17.0)
Hemoglobin: 11.9 g/dL — ABNORMAL LOW (ref 13.0–17.0)
IMMATURE GRANULOCYTES: 0 %
Immature Granulocytes: 0 %
Immature Granulocytes: 1 %
LYMPHS PCT: 23 %
LYMPHS PCT: 18 %
Lymphocytes Relative: 15 %
Lymphs Abs: 1.4 10*3/uL (ref 0.7–4.0)
Lymphs Abs: 1.2 10*3/uL (ref 0.7–4.0)
Lymphs Abs: 0.9 10*3/uL (ref 0.7–4.0)
MCH: 32 pg (ref 26.0–34.0)
MCH: 33.2 pg (ref 26.0–34.0)
MCH: 32.2 pg (ref 26.0–34.0)
MCHC: 32.3 g/dL (ref 30.0–36.0)
MCHC: 32.8 g/dL (ref 30.0–36.0)
MCHC: 32.5 g/dL (ref 30.0–36.0)
MCV: 98.9 fL (ref 80.0–100.0)
MCV: 101.1 fL — ABNORMAL HIGH (ref 80.0–100.0)
MCV: 99.2 fL (ref 80.0–100.0)
Monocytes Absolute: 0.6 10*3/uL (ref 0.1–1.0)
Monocytes Absolute: 0.6 10*3/uL (ref 0.1–1.0)
Monocytes Absolute: 0.6 10*3/uL (ref 0.1–1.0)
Monocytes Relative: 11 %
Monocytes Relative: 8 %
Monocytes Relative: 9 %
NEUTROS ABS: 4.9 10*3/uL (ref 1.7–7.7)
NEUTROS PCT: 74 %
Neutro Abs: 4 10*3/uL (ref 1.7–7.7)
Neutro Abs: 4.7 10*3/uL (ref 1.7–7.7)
Neutrophils Relative %: 65 %
Neutrophils Relative %: 75 %
Platelets: 176 10*3/uL (ref 150–400)
Platelets: 169 10*3/uL (ref 150–400)
Platelets: 171 10*3/uL (ref 150–400)
RBC: 3.75 MIL/uL — ABNORMAL LOW (ref 4.22–5.81)
RBC: 3.68 MIL/uL — ABNORMAL LOW (ref 4.22–5.81)
RBC: 3.69 MIL/uL — ABNORMAL LOW (ref 4.22–5.81)
RDW: 14.3 % (ref 11.5–15.5)
RDW: 14.2 % (ref 11.5–15.5)
RDW: 13.9 % (ref 11.5–15.5)
WBC: 6.1 10*3/uL (ref 4.0–10.5)
WBC: 6.7 10*3/uL (ref 4.0–10.5)
WBC: 6.3 10*3/uL (ref 4.0–10.5)
nRBC: 0 % (ref 0.0–0.2)
nRBC: 0 % (ref 0.0–0.2)
nRBC: 0 % (ref 0.0–0.2)

## 2018-08-14 LAB — COMPREHENSIVE METABOLIC PANEL
ALT: 21 U/L (ref 0–44)
AST: 28 U/L (ref 15–41)
Albumin: 4.1 g/dL (ref 3.5–5.0)
Alkaline Phosphatase: 45 U/L (ref 38–126)
Anion gap: 13 (ref 5–15)
BUN: 6 mg/dL (ref 6–20)
CO2: 25 mmol/L (ref 22–32)
Calcium: 8.6 mg/dL — ABNORMAL LOW (ref 8.9–10.3)
Chloride: 100 mmol/L (ref 98–111)
Creatinine, Ser: 0.91 mg/dL (ref 0.61–1.24)
GFR calc Af Amer: >60 mL/min (ref >60)
GFR calc non Af Amer: >60 mL/min (ref >60)
GLUCOSE: 79 mg/dL (ref 70–99)
Potassium: 3.4 mmol/L — ABNORMAL LOW (ref 3.5–5.1)
Sodium: 138 mmol/L (ref 135–145)
Total Bilirubin: 1.3 mg/dL — ABNORMAL HIGH (ref 0.3–1.2)
Total Protein: 6.7 g/dL (ref 6.5–8.1)

## 2018-08-14 LAB — LACTIC ACID, PLASMA
Lactic Acid, Venous: 0.8 mmol/L (ref 0.5–1.9)
Lactic Acid, Venous: 0.9 mmol/L (ref 0.5–1.9)
Lactic Acid, Venous: 0.9 mmol/L (ref 0.5–1.9)
Lactic Acid, Venous: 1.7 mmol/L (ref 0.5–1.9)

## 2018-08-14 LAB — GLUCOSE, CAPILLARY: Glucose-Capillary: 91 mg/dL (ref 70–99)

## 2018-08-14 MED ORDER — HYDRALAZINE HCL 50 MG PO TABS
50.0000 mg | ORAL_TABLET | Freq: Three times a day (TID) | ORAL | Status: DC
  Administered 2018-08-14 – 2018-08-16 (×5): 50 mg via ORAL
  Filled 2018-08-14 (×5): qty 1

## 2018-08-14 MED ORDER — POTASSIUM CHLORIDE 10 MEQ/100ML IV SOLN
10.0000 meq | INTRAVENOUS | Status: AC
  Administered 2018-08-14 (×3): 10 meq via INTRAVENOUS
  Filled 2018-08-14 (×3): qty 100

## 2018-08-14 MED ORDER — LABETALOL HCL 300 MG PO TABS
300.0000 mg | ORAL_TABLET | Freq: Two times a day (BID) | ORAL | Status: DC
  Administered 2018-08-14 – 2018-08-16 (×4): 300 mg via ORAL
  Filled 2018-08-14 (×4): qty 1

## 2018-08-14 MED ORDER — MAGNESIUM SULFATE 2 GM/50ML IV SOLN
2.0000 g | Freq: Once | INTRAVENOUS | Status: AC
  Administered 2018-08-14: 2 g via INTRAVENOUS
  Filled 2018-08-14: qty 50

## 2018-08-14 MED ORDER — POTASSIUM CHLORIDE 10 MEQ/100ML IV SOLN
10.0000 meq | INTRAVENOUS | Status: DC
  Administered 2018-08-14: 10 meq via INTRAVENOUS
  Filled 2018-08-14: qty 100

## 2018-08-14 NOTE — Progress Notes (Addendum)
PROGRESS NOTE  Brett Molina KCL:[id number] DOB: [birth year] DOA: 08/13/2018 PCP: Patient, No Pcp Per  HPI/Recap of past 24 hours: Brett Molina is a 28 y.o. male with medical history significant of alcohol abuse, alcoholic hepatitis, recent alcoholic gastritis with melena status post EGD, who presented to med [facility] today complaining of intractable nausea vomiting with coffee-ground emesis.  Patient had a similar presentation only last week.  He was in the hospital and evaluated and found to have alcoholic gastritis.  He was discharged on January 28 after evaluation.  He went out and started drinking again.  Unable to stop alcohol intake and return to the ER today with same symptoms.  Was supposed to be on Protonix.  In the ER he was found to be tachycardic and hypertensive.  Patient was transferred there for to the hospital to [facility] for further treatment.  He is having epigastric pain is 6 out of 10.  Patient also shows signs of anxiety.  His last drink was yesterday.  Case discussed with Dr. Marca Ancona of [facility].  08/14/18: reports persistent nausea. No vomiting this am.   Assessment/Plan: Principal Problem:   UGIB (upper gastrointestinal bleed) Active Problems:   Hypokalemia   Alcoholic hepatitis without ascites   Alcohol withdrawal (HCC)   Gastroesophageal reflux disease with esophagitis  Upper GI bleed in the setting of intractable nausea and vomiting suspect 2/2 alcohol abuse Hx of alcohol gastritis Non compliant w his meds H&H trending down but still stable VS stable, not hypotensive GI following  #2 alcohol abuse with concern for withdrawal: CIWA protocol activated.  Thiamine folic acid initiated.  Alcohol cessation counseling given.  Uncontrolled HTN Resume home meds Monitor VS  #3 hypokalemia: Replete potassium.  Most likely secondary to intractable nausea vomiting.  #4 GERD: C/w PPI   DVT prophylaxis: SCD Code Status: Full code Family  Communication: Girlfriend with patient in the room Disposition Plan: To be determined Consults called: [facility] Dr. Marca Ancona     Objective: Vitals:   08/13/18 2039 08/14/18 0554 08/14/18 1406 08/14/18 1838  BP: (!) 153/92 (!) 160/90 (!) 156/89 (!) 143/109  Pulse: 91 91 89 94  Resp: 16 18 14 16   Temp: 100 F (37.8 C) 99.2 F (37.3 C) 99.5 F (37.5 C) 99.3 F (37.4 C)  TempSrc: Oral Oral Oral Oral  SpO2: 98% 99% 99% 99%  Weight:      Height:        Intake/Output Summary (Last 24 hours) at 08/14/2018 1843 Last data filed at 08/14/2018 1400 Gross per 24 hour  Intake 1248.6 ml  Output 300 ml  Net 948.6 ml   Filed Weights   08/13/18 1152  Weight: 102 kg    Exam:  . General: 28 y.o. year-old male well developed well nourished in no acute distress.  Alert and oriented x3. . Cardiovascular: Regular rate and rhythm with no rubs or gallops.  No thyromegaly or JVD noted.   Marland Kitchen Respiratory: Clear to auscultation with no wheezes or rales. Good inspiratory effort. . Abdomen: Soft nontender nondistended with normal bowel sounds x4 quadrants. . Musculoskeletal: No lower extremity edema. 2/4 pulses in all 4 extremities. . Skin: No ulcerative lesions noted or rashes, . Psychiatry: Mood is appropriate for condition and setting   Data Reviewed: CBC: Recent Labs  Lab 08/13/18 1242 08/13/18 1625 08/13/18 2153 08/14/18 0235 08/14/18 0827 08/14/18 1430  WBC 8.3  --  6.2 6.1 6.7 6.3  NEUTROABS 6.3  --  4.1  4.0 4.9 4.7  HGB 14.3 12.9* 12.3* 12.0* 12.2* 11.9*  HCT 43.2 39.1 37.0* 37.1* 37.2* 36.6*  MCV 96.2  --  99.5 98.9 101.1* 99.2  PLT 243  --  178 176 169 171   Basic Metabolic Panel: Recent Labs  Lab 08/10/18 1142 08/13/18 1242 08/13/18 1812 08/14/18 0235  NA 143 136 137 138  K 3.6 3.5 3.2* 3.4*  CL 101 93* 98 100  CO2 25 24 25 25   GLUCOSE 122* 125* 85 79  BUN <5* 5* 5* 6  CREATININE 0.82 0.83 0.88 0.91  CALCIUM 9.1 8.8* 8.4* 8.6*   GFR: Estimated Creatinine Clearance:  151.3 mL/min (by C-G formula based on SCr of 0.91 mg/dL). Liver Function Tests: Recent Labs  Lab 08/10/18 1142 08/13/18 1242 08/14/18 0235  AST 48* 37 28  ALT 41 26 21  ALKPHOS 59 68 45  BILITOT 0.5 0.9 1.3*  PROT 8.8* 7.9 6.7  ALBUMIN 5.5* 4.8 4.1   Recent Labs  Lab 08/10/18 1142 08/13/18 1242  LIPASE 34 23   No results for input(s): AMMONIA in the last 168 hours. Coagulation Profile: No results for input(s): INR, PROTIME in the last 168 hours. Cardiac Enzymes: No results for input(s): CKTOTAL, CKMB, CKMBINDEX, TROPONINI in the last 168 hours. BNP (last 3 results) No results for input(s): PROBNP in the last 8760 hours. HbA1C: No results for input(s): HGBA1C in the last 72 hours. CBG: No results for input(s): GLUCAP in the last 168 hours. Lipid Profile: No results for input(s): CHOL, HDL, LDLCALC, TRIG, CHOLHDL, LDLDIRECT in the last 72 hours. Thyroid Function Tests: No results for input(s): TSH, T4TOTAL, FREET4, T3FREE, THYROIDAB in the last 72 hours. Anemia Panel: No results for input(s): VITAMINB12, FOLATE, FERRITIN, TIBC, IRON, RETICCTPCT in the last 72 hours. Urine analysis:    Component Value Date/Time   COLORURINE AMBER (A) 08/13/2018 1310   APPEARANCEUR CLEAR 08/13/2018 1310   LABSPEC >1.030 (H) 08/13/2018 1310   PHURINE 6.0 08/13/2018 1310   GLUCOSEU NEGATIVE 08/13/2018 1310   HGBUR MODERATE (A) 08/13/2018 1310   BILIRUBINUR SMALL (A) 08/13/2018 1310   KETONESUR 15 (A) 08/13/2018 1310   PROTEINUR >300 (A) 08/13/2018 1310   NITRITE NEGATIVE 08/13/2018 1310   LEUKOCYTESUR NEGATIVE 08/13/2018 1310   Sepsis Labs: @LABRCNTIP (procalcitonin:4,lacticidven:4)  )No results found for this or any previous visit (from the past 240 hour(s)).    Studies: No results found.  Scheduled Meds: . LORazepam  0-4 mg Intravenous Q6H   Or  . LORazepam  0-4 mg Oral Q6H  . [START ON 08/15/2018] LORazepam  0-4 mg Intravenous Q12H   Or  . [START ON 08/15/2018] LORazepam   0-4 mg Oral Q12H  . pantoprazole (PROTONIX) IV  40 mg Intravenous Q12H    Continuous Infusions: . dextrose 5 % and 0.9% NaCl 100 mL/hr at 08/14/18 0713     LOS: 0 days     Darlin Drop, MD [facility] Pager [phone]  If 7PM-7AM, please contact night-coverage [email or web address] Password [email or web address] 08/14/2018, 6:43 PM

## 2018-08-15 LAB — CBC
HCT: 34.1 % — ABNORMAL LOW (ref 39.0–52.0)
Hemoglobin: 11.1 g/dL — ABNORMAL LOW (ref 13.0–17.0)
MCH: 32.8 pg (ref 26.0–34.0)
MCHC: 32.6 g/dL (ref 30.0–36.0)
MCV: 100.9 fL — ABNORMAL HIGH (ref 80.0–100.0)
Platelets: 145 10*3/uL — ABNORMAL LOW (ref 150–400)
RBC: 3.38 MIL/uL — ABNORMAL LOW (ref 4.22–5.81)
RDW: 14 % (ref 11.5–15.5)
WBC: 5.4 10*3/uL (ref 4.0–10.5)
nRBC: 0 % (ref 0.0–0.2)

## 2018-08-15 LAB — COMPREHENSIVE METABOLIC PANEL
ALT: 18 U/L (ref 0–44)
AST: 23 U/L (ref 15–41)
Albumin: 3.8 g/dL (ref 3.5–5.0)
Alkaline Phosphatase: 38 U/L (ref 38–126)
Anion gap: 7 (ref 5–15)
BUN: 7 mg/dL (ref 6–20)
CO2: 26 mmol/L (ref 22–32)
Calcium: 8.7 mg/dL — ABNORMAL LOW (ref 8.9–10.3)
Chloride: 105 mmol/L (ref 98–111)
Creatinine, Ser: 0.83 mg/dL (ref 0.61–1.24)
GFR calc Af Amer: >60 mL/min (ref >60)
GFR calc non Af Amer: >60 mL/min (ref >60)
Glucose, Bld: 101 mg/dL — ABNORMAL HIGH (ref 70–99)
Potassium: 3.4 mmol/L — ABNORMAL LOW (ref 3.5–5.1)
Sodium: 138 mmol/L (ref 135–145)
Total Bilirubin: 0.9 mg/dL (ref 0.3–1.2)
Total Protein: 6.1 g/dL — ABNORMAL LOW (ref 6.5–8.1)

## 2018-08-15 LAB — LACTIC ACID, PLASMA
LACTIC ACID, VENOUS: 2.7 mmol/L — AB (ref 0.5–1.9)
LACTIC ACID, VENOUS: 1.7 mmol/L (ref 0.5–1.9)
Lactic Acid, Venous: 1.5 mmol/L (ref 0.5–1.9)
Lactic Acid, Venous: 1.4 mmol/L (ref 0.5–1.9)
Lactic Acid, Venous: 1.6 mmol/L (ref 0.5–1.9)
Lactic Acid, Venous: 2.2 mmol/L (ref 0.5–1.9)
Lactic Acid, Venous: 1.5 mmol/L (ref 0.5–1.9)

## 2018-08-15 MED ORDER — POTASSIUM CHLORIDE CRYS ER 20 MEQ PO TBCR
40.0000 meq | EXTENDED_RELEASE_TABLET | Freq: Once | ORAL | Status: AC
  Administered 2018-08-15: 40 meq via ORAL
  Filled 2018-08-15: qty 2

## 2018-08-15 NOTE — Plan of Care (Signed)

## 2018-08-15 NOTE — Progress Notes (Signed)
CRITICAL VALUE ALERT  Critical Value:  Lactic acid 2.2  Date & Time Notied:  08/15/2018 1417  Provider Notified: Dr Margo Aye  Orders Received/Actions taken: MD aware

## 2018-08-15 NOTE — Progress Notes (Signed)
PROGRESS NOTE  Brett Molina SJG:[id number] DOB: [birth year] DOA: 08/13/2018 PCP: Patient, No Pcp Per  HPI/Recap of past 24 hours: Brett Molina is a 28 y.o. male with medical history significant of alcohol abuse, alcoholic hepatitis, recent alcoholic gastritis with melena status post EGD, who presented to med [facility] today complaining of intractable nausea vomiting with coffee-ground emesis.  Patient had a similar presentation only last week.  He was in the hospital and evaluated and found to have alcoholic gastritis.  He was discharged on January 28 after evaluation.  He went out and started drinking again.  Unable to stop alcohol intake and return to the ER today with same symptoms.  Was supposed to be on Protonix.  In the ER he was found to be tachycardic and hypertensive.  Patient was transferred there for to the hospital to [facility] for further treatment.  He is having epigastric pain is 6 out of 10.  Patient also shows signs of anxiety.  His last drink was yesterday.  Case discussed with Dr. Marca Ancona of [facility].  08/14/18: reports persistent nausea. No vomiting this am.   08/15/2018: Patient seen and examined his bedside.  No acute events overnight.  Admitting to feeling fidgety.  Receiving treatment for alcohol withdrawal with CIWA protocol.   Assessment/Plan: Principal Problem:   UGIB (upper gastrointestinal bleed) Active Problems:   Hypokalemia   Alcoholic hepatitis without ascites   Alcohol withdrawal (HCC)   Gastroesophageal reflux disease with esophagitis  Upper GI bleed in the setting of intractable nausea and vomiting suspect 2/2 alcohol abuse Hx of alcohol gastritis with recent EGD less than 2 weeks ago at [facility] Non compliant w his meds VS stable, not hypotensive H&H stable No sign of overt bleeding  Alcohol abuse with concern for withdrawal Continue CIWA protocol  Continue thiamine and folic acid Alcohol cessation counseled on at bedside  Resolving  uncontrolled HTN Continue po hydralazine Continue to monitor vital signs  Hypokalemia Potassium 3.4 Repleted Repeat BMP in the morning   GERD:  Continue IV Protonix Transition to oral Protonix tomorrow 40 mg twice daily   DVT prophylaxis: SCDs hold off pharmaceutical DVT prophylaxis due to suspected recent upper GI bleed Code Status: Full code Family Communication:  None at bedside  disposition Plan:  Home possibly in 1 to 2 days Consults called:  None     Objective: Vitals:   08/14/18 1838 08/14/18 2134 08/15/18 0502 08/15/18 1230  BP: (!) 143/109 (!) 149/104 138/81 (!) 153/99  Pulse: 94 93 82 89  Resp: 16 16 16    Temp: 99.3 F (37.4 C) 99 F (37.2 C) 98.3 F (36.8 C)   TempSrc: Oral Oral Oral   SpO2: 99% 98% 98%   Weight:      Height:        Intake/Output Summary (Last 24 hours) at 08/15/2018 1605 Last data filed at 08/15/2018 0300 Gross per 24 hour  Intake 1387.15 ml  Output 375 ml  Net 1012.15 ml   Filed Weights   08/13/18 1152  Weight: 102 kg    Exam:  . General: 28 y.o. year-old male developed well-nourished in no acute distress.  Alert and oriented x3 . Cardiovascular: Regular rate and rhythm with no rubs or gallops.  No JVD or thyromegaly . Respiratory: Clear to auscultation with no wheezes or rales.  Good inspiratory effort . Abdomen: Soft nontender nondistended with normal bowel sounds x4 quadrants. . Musculoskeletal: No lower extremity edema. 2/4 pulses in all  4 extremities. . Skin: No ulcerative lesions noted or rashes, . Psychiatry: Mood is appropriate for condition and setting   Data Reviewed: CBC: Recent Labs  Lab 08/13/18 1242  08/13/18 2153 08/14/18 0235 08/14/18 0827 08/14/18 1430 08/15/18 0500  WBC 8.3  --  6.2 6.1 6.7 6.3 5.4  NEUTROABS 6.3  --  4.1 4.0 4.9 4.7  --   HGB 14.3   < > 12.3* 12.0* 12.2* 11.9* 11.1*  HCT 43.2   < > 37.0* 37.1* 37.2* 36.6* 34.1*  MCV 96.2  --  99.5 98.9 101.1* 99.2 100.9*  PLT 243  --  178  176 169 171 145*   < > = values in this interval not displayed.   Basic Metabolic Panel: Recent Labs  Lab 08/10/18 1142 08/13/18 1242 08/13/18 1812 08/14/18 0235 08/15/18 0500  NA 143 136 137 138 138  K 3.6 3.5 3.2* 3.4* 3.4*  CL 101 93* 98 100 105  CO2 25 24 25 25 26   GLUCOSE 122* 125* 85 79 101*  BUN <5* 5* 5* 6 7  CREATININE 0.82 0.83 0.88 0.91 0.83  CALCIUM 9.1 8.8* 8.4* 8.6* 8.7*   GFR: Estimated Creatinine Clearance: 165.8 mL/min (by C-G formula based on SCr of 0.83 mg/dL). Liver Function Tests: Recent Labs  Lab 08/10/18 1142 08/13/18 1242 08/14/18 0235 08/15/18 0500  AST 48* 37 28 23  ALT 41 26 21 18   ALKPHOS 59 68 45 38  BILITOT 0.5 0.9 1.3* 0.9  PROT 8.8* 7.9 6.7 6.1*  ALBUMIN 5.5* 4.8 4.1 3.8   Recent Labs  Lab 08/10/18 1142 08/13/18 1242  LIPASE 34 23   No results for input(s): AMMONIA in the last 168 hours. Coagulation Profile: No results for input(s): INR, PROTIME in the last 168 hours. Cardiac Enzymes: No results for input(s): CKTOTAL, CKMB, CKMBINDEX, TROPONINI in the last 168 hours. BNP (last 3 results) No results for input(s): PROBNP in the last 8760 hours. HbA1C: No results for input(s): HGBA1C in the last 72 hours. CBG: Recent Labs  Lab 08/14/18 2135  GLUCAP 91   Lipid Profile: No results for input(s): CHOL, HDL, LDLCALC, TRIG, CHOLHDL, LDLDIRECT in the last 72 hours. Thyroid Function Tests: No results for input(s): TSH, T4TOTAL, FREET4, T3FREE, THYROIDAB in the last 72 hours. Anemia Panel: No results for input(s): VITAMINB12, FOLATE, FERRITIN, TIBC, IRON, RETICCTPCT in the last 72 hours. Urine analysis:    Component Value Date/Time   COLORURINE AMBER (A) 08/13/2018 1310   APPEARANCEUR CLEAR 08/13/2018 1310   LABSPEC >1.030 (H) 08/13/2018 1310   PHURINE 6.0 08/13/2018 1310   GLUCOSEU NEGATIVE 08/13/2018 1310   HGBUR MODERATE (A) 08/13/2018 1310   BILIRUBINUR SMALL (A) 08/13/2018 1310   KETONESUR 15 (A) 08/13/2018 1310    PROTEINUR >300 (A) 08/13/2018 1310   NITRITE NEGATIVE 08/13/2018 1310   LEUKOCYTESUR NEGATIVE 08/13/2018 1310   Sepsis Labs: @LABRCNTIP (procalcitonin:4,lacticidven:4)  )No results found for this or any previous visit (from the past 240 hour(s)).    Studies: No results found.  Scheduled Meds: . hydrALAZINE  50 mg Oral TID  . labetalol  300 mg Oral BID  . LORazepam  0-4 mg Intravenous Q12H   Or  . LORazepam  0-4 mg Oral Q12H  . pantoprazole (PROTONIX) IV  40 mg Intravenous Q12H    Continuous Infusions: . dextrose 5 % and 0.9% NaCl 100 mL/hr at 08/15/18 1519     LOS: 0 days     Darlin Drop, MD [facility] Pager [phone]  If 7PM-7AM, please contact night-coverage [email or web address] Password [email or web address] 08/15/2018, 4:05 PM

## 2018-08-15 NOTE — Progress Notes (Signed)
CRITICAL VALUE ALERT  Critical Value:  Lactic acid 2.7  Date & Time Notied:  08/15/2018 1703  Provider Notified: Dr Margo Aye  Orders Received/Actions taken: MD aware

## 2018-08-16 LAB — BASIC METABOLIC PANEL
Anion gap: 8 (ref 5–15)
CO2: 23 mmol/L (ref 22–32)
Calcium: 8.5 mg/dL — ABNORMAL LOW (ref 8.9–10.3)
Chloride: 108 mmol/L (ref 98–111)
Creatinine, Ser: 0.76 mg/dL (ref 0.61–1.24)
GFR calc Af Amer: >60 mL/min (ref >60)
GFR calc non Af Amer: >60 mL/min (ref >60)
Glucose, Bld: 102 mg/dL — ABNORMAL HIGH (ref 70–99)
POTASSIUM: 3.3 mmol/L — AB (ref 3.5–5.1)
Sodium: 139 mmol/L (ref 135–145)

## 2018-08-16 LAB — MAGNESIUM: Magnesium: 1.9 mg/dL (ref 1.7–2.4)

## 2018-08-16 MED ORDER — ADULT MULTIVITAMIN W/MINERALS CH
1.0000 | ORAL_TABLET | Freq: Every day | ORAL | Status: DC
  Administered 2018-08-16: 1 via ORAL
  Filled 2018-08-16: qty 1

## 2018-08-16 MED ORDER — POTASSIUM CHLORIDE CRYS ER 20 MEQ PO TBCR
40.0000 meq | EXTENDED_RELEASE_TABLET | Freq: Two times a day (BID) | ORAL | Status: DC
  Administered 2018-08-16: 40 meq via ORAL
  Filled 2018-08-16: qty 2

## 2018-08-16 MED ORDER — ADULT MULTIVITAMIN W/MINERALS CH: 1.0000 | ORAL_TABLET | Freq: Every day | ORAL | 0 refills | Status: DC

## 2018-08-16 MED ORDER — FOLIC ACID 1 MG PO TABS
1.0000 mg | ORAL_TABLET | Freq: Every day | ORAL | Status: DC
  Administered 2018-08-16: 1 mg via ORAL
  Filled 2018-08-16: qty 1

## 2018-08-16 MED ORDER — THIAMINE HCL 100 MG PO TABS: 100.0000 mg | ORAL_TABLET | Freq: Every day | ORAL | 0 refills | Status: DC

## 2018-08-16 MED ORDER — FAMOTIDINE 20 MG PO TABS: 20.0000 mg | ORAL_TABLET | Freq: Two times a day (BID) | ORAL | 0 refills | Status: DC

## 2018-08-16 MED ORDER — CHLORDIAZEPOXIDE HCL 25 MG PO CAPS: ORAL_CAPSULE | ORAL | 0 refills | Status: DC

## 2018-08-16 MED ORDER — HYDRALAZINE HCL 25 MG PO TABS: 50.0000 mg | ORAL_TABLET | Freq: Three times a day (TID) | ORAL | 0 refills | Status: DC

## 2018-08-16 MED ORDER — PANTOPRAZOLE SODIUM 40 MG PO TBEC: 40.0000 mg | DELAYED_RELEASE_TABLET | Freq: Every day | ORAL | 0 refills | Status: DC

## 2018-08-16 MED ORDER — VITAMIN B-1 100 MG PO TABS
100.0000 mg | ORAL_TABLET | Freq: Every day | ORAL | Status: DC
  Administered 2018-08-16: 100 mg via ORAL
  Filled 2018-08-16: qty 1

## 2018-08-16 MED ORDER — CHLORDIAZEPOXIDE HCL 25 MG PO CAPS
25.0000 mg | ORAL_CAPSULE | Freq: Three times a day (TID) | ORAL | Status: DC
  Administered 2018-08-16: 25 mg via ORAL
  Filled 2018-08-16: qty 1

## 2018-08-16 MED ORDER — FOLIC ACID 1 MG PO TABS: 1.0000 mg | ORAL_TABLET | Freq: Every day | ORAL | 0 refills | Status: DC

## 2018-08-16 MED ORDER — PANTOPRAZOLE SODIUM 40 MG PO TBEC
40.0000 mg | DELAYED_RELEASE_TABLET | Freq: Two times a day (BID) | ORAL | Status: DC
  Administered 2018-08-16: 40 mg via ORAL
  Filled 2018-08-16: qty 1

## 2018-08-16 MED ORDER — LABETALOL HCL 300 MG PO TABS: 300.0000 mg | ORAL_TABLET | Freq: Two times a day (BID) | ORAL | 0 refills | Status: DC

## 2018-08-16 NOTE — Discharge Instructions (Signed)
Gastrointestinal Bleeding ° °Gastrointestinal bleeding is bleeding somewhere along the path food travels through the body (digestive tract). This path is anywhere between the mouth and the opening of the butt (anus). You may have blood in your poop (stools) or have black poop. If you throw up (vomit), there may be blood in it. °This condition can be mild, serious, or even life-threatening. If you have a lot of bleeding, you may need to stay in the hospital. °Follow these instructions at home: °· Take over-the-counter and prescription medicines only as told by your doctor. °· Eat foods that have a lot of fiber in them. These foods include whole grains, fruits, and vegetables. You can also try eating 1-3 prunes each day. °· Drink enough fluid to keep your pee (urine) clear or pale yellow. °· Keep all follow-up visits as told by your doctor. This is important. °Contact a doctor if: °· Your symptoms do not get better. °Get help right away if: °· Your bleeding gets worse. °· You feel dizzy or you pass out (faint). °· You feel weak. °· You have very bad cramps in your back or belly (abdomen). °· You pass large clumps of blood (clots) in your poop. °· Your symptoms are getting worse. °This information is not intended to replace advice given to you by your health care provider. Make sure you discuss any questions you have with your health care provider. °Document Released: 03/31/2008 Document Revised: 11/28/2015 Document Reviewed: 12/10/2014 °Elsevier Interactive Patient Education © 2019 [employer] ° °

## 2018-08-16 NOTE — Discharge Summary (Signed)
Discharge Summary  Brett Molina VPC:[id number] DOB: 08-04-1990  PCP: Patient, No Pcp Per  Admit date: 08/13/2018 Discharge date: 08/16/2018  Time spent: 35 minutes  Recommendations for Outpatient Follow-up:  1. Follow-up with GI 2. Follow-up with PCP 3. Take your medications as prescribed  Discharge Diagnoses:  Active Hospital Problems   Diagnosis Date Noted  . UGIB (upper gastrointestinal bleed) 08/13/2018  . Gastroesophageal reflux disease with esophagitis   . Alcohol withdrawal (HCC) 02/16/2017  . Alcoholic hepatitis without ascites 12/12/2016  . Hypokalemia 12/12/2016    Resolved Hospital Problems  No resolved problems to display.    Discharge Condition: Stable  Diet recommendation: Heart healthy diet  Vitals:   08/15/18 2132 08/16/18 0455  BP: (!) 149/85 (!) 143/83  Pulse: (!) 103 84  Resp: 18 18  Temp: 97.8 F (36.6 C) 98.7 F (37.1 C)  SpO2: 100% 100%    History of present illness:  Brett Molina a 28 y.o.malewith medical history significant ofalcohol abuse, alcoholic hepatitis, recent alcoholic gastritis with melena status post EGD, who presented to med [facility] today complaining of intractable nausea vomiting with coffee-ground emesis. Patient had a similar presentation only last week. He was in the hospital and evaluated and found to have alcoholic gastritis.He was discharged on January 28 after evaluation. He went out and started drinking again. Unable to stop alcohol intake and return to the ER today with same symptoms. Was supposed to be on Protonix. In the ER he was found to be tachycardic and hypertensive. Patient was transferred to [facility] for further treatment. He is having epigastric pain 6 out of 10. Admitted for presumed upper GI bleed. Restarted on PPI. No recurrences while in the hospital. Received treatment for alcohol withdrawal with CIWA protocol.  08/16/2018: Patient seen and examined at bedside.   No acute events overnight.  Denies nausea, vomiting, or diarrhea.  He is tolerating a solid diet well.  No new complaints.  On the day of discharge, the patient was hemodynamically stable.  He will need to follow-up with his primary care provider, and GI posthospitalization.  He will also need to take his medications as prescribed.   Hospital Course:  Principal Problem:   UGIB (upper gastrointestinal bleed) Active Problems:   Hypokalemia   Alcoholic hepatitis without ascites   Alcohol withdrawal (HCC)   Gastroesophageal reflux disease with esophagitis  Upper GI bleed in the setting of intractable nausea and vomiting suspect 2/2 alcohol abuse Hx of alcohol gastritis with recent EGD less than 2 weeks ago  Non compliant w his meds VS stable, not hypotensive H&H stable No sign of overt bleeding  Alcohol abuse with concern for withdrawal Treated with CIWA protocol  Continue thiamine and folic acid Alcohol cessation counseled on at bedside Continue Librium as prescribed  Resolving uncontrolled HTN Continue po hydralazine Continue to monitor vital signs  Hypokalemia Potassium 3.3 Repleted Follow-up with your primary care provider  GERD:  Continue PPI Follow-up with GI   Code Status:Full code      Discharge Exam: BP (!) 143/83 (BP Location: Right Arm)   Pulse 84   Temp 98.7 F (37.1 C) (Oral)   Resp 18   Ht 6\' 2"  (1.88 m)   Wt 102 kg   SpO2 100%   BMI 28.87 kg/m  . General: 28 y.o. year-old male well developed well nourished in no acute distress.  Alert and oriented x3. . Cardiovascular: Regular rate and rhythm with no rubs or gallops.  No thyromegaly or JVD noted.   Marland Kitchen Respiratory: Clear to auscultation with no wheezes or rales. Good inspiratory effort. . Abdomen: Soft nontender nondistended with normal bowel sounds x4 quadrants. . Musculoskeletal: No lower extremity edema. 2/4 pulses in all 4 extremities. . Skin: No ulcerative lesions noted or  rashes, . Psychiatry: Mood is appropriate for condition and setting  Discharge Instructions You were cared for by a hospitalist during your hospital stay. If you have any questions about your discharge medications or the care you received while you were in the hospital after you are discharged, you can call the unit and asked to speak with the hospitalist on call if the hospitalist that took care of you is not available. Once you are discharged, your primary care physician will handle any further medical issues. Please note that NO REFILLS for any discharge medications will be authorized once you are discharged, as it is imperative that you return to your primary care physician (or establish a relationship with a primary care physician if you do not have one) for your aftercare needs so that they can reassess your need for medications and monitor your lab values.   Allergies as of 08/16/2018   No Known Allergies     Medication List    TAKE these medications   chlordiazePOXIDE 25 MG capsule Commonly known as:  LIBRIUM Take 1 tablet 3 times a day for 2 days then 1 tablet 2 times a day for 2 days and then 1 tablet daily till done.   famotidine 20 MG tablet Commonly known as:  PEPCID Take 1 tablet (20 mg total) by mouth 2 (two) times daily for 30 days. What changed:  how much to take   folic acid 1 MG tablet Commonly known as:  FOLVITE Take 1 tablet (1 mg total) by mouth daily.   hydrALAZINE 25 MG tablet Commonly known as:  APRESOLINE Take 2 tablets (50 mg total) by mouth 3 (three) times daily for 30 days.   labetalol 300 MG tablet Commonly known as:  NORMODYNE Take 1 tablet (300 mg total) by mouth 2 (two) times daily.   multivitamin with minerals Tabs tablet Take 1 tablet by mouth daily.   pantoprazole 40 MG tablet Commonly known as:  PROTONIX Take 1 tablet (40 mg total) by mouth daily at 6 (six) AM.   thiamine 100 MG tablet Take 1 tablet (100 mg total) by mouth daily.       No Known Allergies Follow-up Information    Kerin Salen, MD. Call in 1 day(s).   Specialty:  Gastroenterology Why:  Please call for a post hospital follow-up appointment. Contact information: [address] [place] [address] [phone]        [facility]. Call in 1 day(s).   Why:  Please call for post hospital follow-up appointment. Contact information: [address] [place] [address] [phone]           The results of significant diagnostics from this hospitalization (including imaging, microbiology, ancillary and laboratory) are listed below for reference.    Significant Diagnostic Studies: Ct Abdomen Pelvis W Contrast  Result Date: 07/23/2018 CLINICAL DATA:  100 mL isovue 300 given, tolerated well without complication or reaction No PO contrast per PA order, not tolerating liquids Pt with n/v, hematemesis, fever Symptoms x 3-4 days, no pain at this time Hx HTN Labs within normal range^169mL ISOVUE-300 IOPAMIDOL (ISOVUE-300) INJECTION 61%Hematemesis EXAM: CT ABDOMEN AND PELVIS WITH  CONTRAST TECHNIQUE: Multidetector CT imaging of the abdomen and pelvis was performed using the standard protocol following bolus administration of intravenous contrast. CONTRAST:  ISOVUE-300 IOPAMIDOL (ISOVUE-300) INJECTION 61% COMPARISON:  None. FINDINGS: Lower chest: Moderate RIGHT effusion layers dependently in the RIGHT hemithorax. Mild RIGHT basilar atelectasis. No pneumonitis or pneumonia. Hepatobiliary: Hypodense regions along the falciform ligament and the gallbladder fossa most consistent fatty infiltration liver. Liver overall is low-attenuation which could indicate hepatic steatosis. Gallbladder normal. Normal common bile duct. Pancreas: Pancreas is normal. No ductal dilatation. No pancreatic inflammation. Spleen: Normal spleen Adrenals/urinary tract: Adrenal glands and kidneys are normal. The ureters and bladder  normal. Stomach/Bowel: There is circumferential mural thickening of the distal esophagus from GE junction superiorly to the upper limits of the scan range (6 cm). Single wall thickness measures 9 mm (image 6/2). The stomach, duodenum, small-bowel appendix normal. The colon and rectosigmoid colon normal. Vascular/Lymphatic: Abdominal aorta is normal caliber. No periportal or retroperitoneal adenopathy. No pelvic adenopathy. Reproductive: Prostate normal Other: No free fluid. Musculoskeletal: No aggressive osseous lesion. IMPRESSION: 1. Long segment of circumferential mucosal thickening of the distal esophagus suggests esophagitis. Recommend clinical correlation and consider upper endoscopy. 2. Moderate layering RIGHT pleural effusion. 3. Potential hepatic steatosis and fatty infiltration along the gallbladder fossa and falciform ligament. Electronically Signed   By: Genevive Bi M.D.   On: 07/23/2018 18:30    Microbiology: No results found for this or any previous visit (from the past 240 hour(s)).   Labs: Basic Metabolic Panel: Recent Labs  Lab 08/13/18 1242 08/13/18 1812 08/14/18 0235 08/15/18 0500 08/16/18 0459  NA 136 137 138 138 139  K 3.5 3.2* 3.4* 3.4* 3.3*  CL 93* 98 100 105 108  CO2 24 25 25 26 23   GLUCOSE 125* 85 79 101* 102*  BUN 5* 5* 6 7 <5*  CREATININE 0.83 0.88 0.91 0.83 0.76  CALCIUM 8.8* 8.4* 8.6* 8.7* 8.5*  MG  --   --   --   --  1.9   Liver Function Tests: Recent Labs  Lab 08/10/18 1142 08/13/18 1242 08/14/18 0235 08/15/18 0500  AST 48* 37 28 23  ALT 41 26 21 18   ALKPHOS 59 68 45 38  BILITOT 0.5 0.9 1.3* 0.9  PROT 8.8* 7.9 6.7 6.1*  ALBUMIN 5.5* 4.8 4.1 3.8   Recent Labs  Lab 08/10/18 1142 08/13/18 1242  LIPASE 34 23   No results for input(s): AMMONIA in the last 168 hours. CBC: Recent Labs  Lab 08/13/18 1242  08/13/18 2153 08/14/18 0235 08/14/18 0827 08/14/18 1430 08/15/18 0500  WBC 8.3  --  6.2 6.1 6.7 6.3 5.4  NEUTROABS 6.3  --  4.1 4.0  4.9 4.7  --   HGB 14.3   < > 12.3* 12.0* 12.2* 11.9* 11.1*  HCT 43.2   < > 37.0* 37.1* 37.2* 36.6* 34.1*  MCV 96.2  --  99.5 98.9 101.1* 99.2 100.9*  PLT 243  --  178 176 169 171 145*   < > = values in this interval not displayed.   Cardiac Enzymes: No results for input(s): CKTOTAL, CKMB, CKMBINDEX, TROPONINI in the last 168 hours. BNP: BNP (last 3 results) No results for input(s): BNP in the last 8760 hours.  ProBNP (last 3 results) No results for input(s): PROBNP in the last 8760 hours.  CBG: Recent Labs  Lab 08/14/18 2135  GLUCAP 91       Signed:  Darlin Drop, MD [facility] 08/16/2018, 2:03 PM

## 2018-11-19 ENCOUNTER — Encounter (HOSPITAL_COMMUNITY): Payer: Self-pay | Admitting: *Deleted

## 2018-11-19 ENCOUNTER — Other Ambulatory Visit: Payer: Self-pay

## 2018-11-19 ENCOUNTER — Inpatient Hospital Stay (HOSPITAL_COMMUNITY)
Admission: EM | Admit: 2018-11-19 | Discharge: 2018-12-07 | DRG: 897 | Disposition: A | Discharge status: Home Health | Payer: Self-pay | Attending: Internal Medicine | Admitting: Internal Medicine

## 2018-11-19 ENCOUNTER — Emergency Department (HOSPITAL_COMMUNITY): Payer: Self-pay

## 2018-11-19 DIAGNOSIS — E872 Acidosis: Secondary | ICD-10-CM | POA: Diagnosis present

## 2018-11-19 DIAGNOSIS — F10920 Alcohol use, unspecified with intoxication, uncomplicated: Secondary | ICD-10-CM

## 2018-11-19 DIAGNOSIS — R270 Ataxia, unspecified: Secondary | ICD-10-CM

## 2018-11-19 DIAGNOSIS — F10239 Alcohol dependence with withdrawal, unspecified: Secondary | ICD-10-CM | POA: Diagnosis present

## 2018-11-19 DIAGNOSIS — F10229 Alcohol dependence with intoxication, unspecified: Secondary | ICD-10-CM | POA: Diagnosis present

## 2018-11-19 DIAGNOSIS — F10929 Alcohol use, unspecified with intoxication, unspecified: Secondary | ICD-10-CM | POA: Diagnosis present

## 2018-11-19 DIAGNOSIS — F1010 Alcohol abuse, uncomplicated: Secondary | ICD-10-CM | POA: Diagnosis present

## 2018-11-19 DIAGNOSIS — R402242 Coma scale, best verbal response, confused conversation, at arrival to emergency department: Secondary | ICD-10-CM | POA: Diagnosis present

## 2018-11-19 DIAGNOSIS — Z1159 Encounter for screening for other viral diseases: Secondary | ICD-10-CM

## 2018-11-19 DIAGNOSIS — E512 Wernicke's encephalopathy: Secondary | ICD-10-CM | POA: Diagnosis present

## 2018-11-19 DIAGNOSIS — F419 Anxiety disorder, unspecified: Secondary | ICD-10-CM | POA: Diagnosis present

## 2018-11-19 DIAGNOSIS — K2100 Gastro-esophageal reflux disease with esophagitis, without bleeding: Secondary | ICD-10-CM | POA: Diagnosis present

## 2018-11-19 DIAGNOSIS — R402132 Coma scale, eyes open, to sound, at arrival to emergency department: Secondary | ICD-10-CM | POA: Diagnosis present

## 2018-11-19 DIAGNOSIS — N179 Acute kidney failure, unspecified: Secondary | ICD-10-CM | POA: Diagnosis present

## 2018-11-19 DIAGNOSIS — I10 Essential (primary) hypertension: Secondary | ICD-10-CM | POA: Diagnosis present

## 2018-11-19 DIAGNOSIS — F1020 Alcohol dependence, uncomplicated: Secondary | ICD-10-CM

## 2018-11-19 DIAGNOSIS — Z9114 Patient's other noncompliance with medication regimen: Secondary | ICD-10-CM

## 2018-11-19 DIAGNOSIS — K7010 Alcoholic hepatitis without ascites: Secondary | ICD-10-CM | POA: Diagnosis present

## 2018-11-19 DIAGNOSIS — Z79899 Other long term (current) drug therapy: Secondary | ICD-10-CM

## 2018-11-19 DIAGNOSIS — F329 Major depressive disorder, single episode, unspecified: Secondary | ICD-10-CM | POA: Diagnosis present

## 2018-11-19 DIAGNOSIS — R509 Fever, unspecified: Secondary | ICD-10-CM | POA: Diagnosis not present

## 2018-11-19 DIAGNOSIS — D539 Nutritional anemia, unspecified: Secondary | ICD-10-CM | POA: Diagnosis present

## 2018-11-19 DIAGNOSIS — F10231 Alcohol dependence with withdrawal delirium: Principal | ICD-10-CM | POA: Diagnosis present

## 2018-11-19 DIAGNOSIS — E860 Dehydration: Secondary | ICD-10-CM | POA: Diagnosis present

## 2018-11-19 DIAGNOSIS — R402362 Coma scale, best motor response, obeys commands, at arrival to emergency department: Secondary | ICD-10-CM | POA: Diagnosis present

## 2018-11-19 DIAGNOSIS — J189 Pneumonia, unspecified organism: Secondary | ICD-10-CM

## 2018-11-19 DIAGNOSIS — F10939 Alcohol use, unspecified with withdrawal, unspecified: Secondary | ICD-10-CM | POA: Diagnosis present

## 2018-11-19 LAB — COMPREHENSIVE METABOLIC PANEL
ALT: 39 U/L (ref 0–44)
AST: 31 U/L (ref 15–41)
Albumin: 4.3 g/dL (ref 3.5–5.0)
Alkaline Phosphatase: 67 U/L (ref 38–126)
Anion gap: 15 (ref 5–15)
BUN: <5 mg/dL — ABNORMAL LOW (ref 6–20)
CO2: 21 mmol/L — ABNORMAL LOW (ref 22–32)
Calcium: 9.2 mg/dL (ref 8.9–10.3)
Chloride: 107 mmol/L (ref 98–111)
Creatinine, Ser: 0.67 mg/dL (ref 0.61–1.24)
GFR calc Af Amer: >60 mL/min (ref >60)
GFR calc non Af Amer: >60 mL/min (ref >60)
Glucose, Bld: 99 mg/dL (ref 70–99)
Potassium: 3.5 mmol/L (ref 3.5–5.1)
Sodium: 143 mmol/L (ref 135–145)
Total Bilirubin: 0.3 mg/dL (ref 0.3–1.2)
Total Protein: 7.9 g/dL (ref 6.5–8.1)

## 2018-11-19 LAB — URINALYSIS, ROUTINE W REFLEX MICROSCOPIC
Bacteria, UA: NONE SEEN
Bilirubin Urine: NEGATIVE
Glucose, UA: NEGATIVE mg/dL
Ketones, ur: NEGATIVE mg/dL
Leukocytes,Ua: NEGATIVE
Nitrite: NEGATIVE
Protein, ur: NEGATIVE mg/dL
Specific Gravity, Urine: 1.003 — ABNORMAL LOW (ref 1.005–1.030)
pH: 6 (ref 5.0–8.0)

## 2018-11-19 LAB — RAPID URINE DRUG SCREEN, HOSP PERFORMED
Amphetamines: NOT DETECTED
Barbiturates: NOT DETECTED
Benzodiazepines: POSITIVE — AB
Cocaine: NOT DETECTED
Opiates: NOT DETECTED
Tetrahydrocannabinol: NOT DETECTED

## 2018-11-19 LAB — SARS CORONAVIRUS 2 BY RT PCR (HOSPITAL ORDER, PERFORMED IN ~~LOC~~ HOSPITAL LAB): SARS Coronavirus 2: NEGATIVE

## 2018-11-19 LAB — LACTIC ACID, PLASMA
Lactic Acid, Venous: 3.8 mmol/L (ref 0.5–1.9)
Lactic Acid, Venous: 3.2 mmol/L (ref 0.5–1.9)

## 2018-11-19 LAB — LIPASE, BLOOD: Lipase: 29 U/L (ref 11–51)

## 2018-11-19 LAB — PROTIME-INR
INR: 1 (ref 0.8–1.2)
Prothrombin Time: 13.5 seconds (ref 11.4–15.2)

## 2018-11-19 LAB — SALICYLATE LEVEL: Salicylate Lvl: <7 mg/dL (ref 2.8–30.0)

## 2018-11-19 LAB — CBG MONITORING, ED: Glucose-Capillary: 81 mg/dL (ref 70–99)

## 2018-11-19 LAB — CK: Total CK: 154 U/L (ref 49–397)

## 2018-11-19 LAB — ACETAMINOPHEN LEVEL: Acetaminophen (Tylenol), Serum: <10 ug/mL — ABNORMAL LOW (ref 10–30)

## 2018-11-19 LAB — ETHANOL: Alcohol, Ethyl (B): 411 mg/dL (ref <10)

## 2018-11-19 MED ORDER — SODIUM CHLORIDE 0.9 % IV BOLUS
1000.0000 mL | Freq: Once | INTRAVENOUS | Status: AC
  Administered 2018-11-19: 1000 mL via INTRAVENOUS

## 2018-11-19 MED ORDER — THIAMINE HCL 100 MG/ML IJ SOLN
100.0000 mg | Freq: Once | INTRAMUSCULAR | Status: AC
  Administered 2018-11-19: 100 mg via INTRAVENOUS
  Filled 2018-11-19: qty 2

## 2018-11-19 NOTE — ED Notes (Signed)
Bed: KZ60 Expected date:  Expected time:  Means of arrival:  Comments: 28yo Male, ETOH and Failure to [facility]

## 2018-11-19 NOTE — ED Notes (Signed)
Date and time results received: 11/19/18 2229 (use smartphrase ".now" to insert current time)  Test: lactic acid Critical Value: 3.2  Name of Provider Notified: Lyndel Safe  Orders Received? Or Actions Taken?: notified Lyndel Safe of lactic acid 3.2.

## 2018-11-19 NOTE — ED Notes (Signed)
Date and time results received: 11/19/18 6:59 PM  (use smartphrase ".now" to insert current time)  Test: lactic acid  Critical Value: 3.8  Name of Provider Notified: patty Rn   Orders Received? Or Actions Taken?:

## 2018-11-19 NOTE — ED Notes (Signed)
Date and time results received: 11/19/18 7:02 PM  (use smartphrase ".now" to insert current time)  Test: ETOH  Critical Value: 411 Name of Provider Notified: Patty RN   Orders Received? Or Actions Taken?:

## 2018-11-19 NOTE — ED Notes (Addendum)
Ambulated pt in hall with walker with Lyndel Safe, PA-C.  TTS machine is in the room.

## 2018-11-19 NOTE — ED Notes (Signed)
BAL 411, assess when clinically sober, RN informed.

## 2018-11-19 NOTE — ED Provider Notes (Cosign Needed)
[place] DEPT Provider Note   CSN: [id number] Arrival date & time: 11/19/18  1706    History   Chief Complaint Chief Complaint  Patient presents with  . Alcohol Intoxication    HPI Brett Molina is a 28 y.o. male with a past medical history of hypertension, alcohol abuse, upper GI bleed, major depressive disorder, who presents today for evaluation of alcohol intoxication.  History is limited as patient is unable to contribute to history.    According to EMS patient has been using a borrowed walker at home to ambulate and drank a liter and a half of wine shortly prior to EMS arriving.  He reportedly has only been drinking alcohol for the past 2 to 3 weeks.  Chart review shows that he has been admitted before for alcohol related complications.    HPI  Past Medical History:  Diagnosis Date  . ETOH abuse   . Hypertension     Patient Active Problem List   Diagnosis Date Noted  . UGIB (upper gastrointestinal bleed) 08/13/2018  . Intractable vomiting with nausea   . Gastroesophageal reflux disease with esophagitis   . GI bleed 07/23/2018  . Upper GI bleeding 07/23/2018  . Alcohol abuse 02/18/2017  . Alcohol withdrawal ([place]) 02/16/2017  . Alcoholic intoxication without complication ([place]) 78/29/5621  . Hypokalemia 12/12/2016  . Alcoholic hepatitis without ascites 12/12/2016  . MDD (major depressive disorder) 12/12/2016    Past Surgical History:  Procedure Laterality Date  . ESOPHAGOGASTRODUODENOSCOPY N/A 07/24/2018   Procedure: ESOPHAGOGASTRODUODENOSCOPY (EGD);  Surgeon: Milus Banister, MD;  Location: [facility];  Service: Endoscopy;  Laterality: N/A;        Home Medications    Prior to Admission medications   Medication Sig Start Date End Date Taking? Authorizing Provider  chlordiazePOXIDE (LIBRIUM) 25 MG capsule Take 1 tablet 3 times a day for 2 days then 1 tablet 2 times a day for 2 days and then 1 tablet daily till done.  08/16/18   Kayleen Memos, DO  famotidine (PEPCID) 20 MG tablet Take 1 tablet (20 mg total) by mouth 2 (two) times daily for 30 days. 08/16/18 09/15/18  Kayleen Memos, DO  folic acid (FOLVITE) 1 MG tablet Take 1 tablet (1 mg total) by mouth daily. 08/16/18   Kayleen Memos, DO  hydrALAZINE (APRESOLINE) 25 MG tablet Take 2 tablets (50 mg total) by mouth 3 (three) times daily for 30 days. 08/16/18 09/15/18  Kayleen Memos, DO  labetalol (NORMODYNE) 300 MG tablet Take 1 tablet (300 mg total) by mouth 2 (two) times daily. 08/16/18   Kayleen Memos, DO  Multiple Vitamin (MULTIVITAMIN WITH MINERALS) TABS tablet Take 1 tablet by mouth daily. 08/16/18   Kayleen Memos, DO  pantoprazole (PROTONIX) 40 MG tablet Take 1 tablet (40 mg total) by mouth daily at 6 (six) AM. 08/16/18   Kayleen Memos, DO  thiamine 100 MG tablet Take 1 tablet (100 mg total) by mouth daily. 08/16/18   Kayleen Memos, DO    Family History No family history on file.  Social History Social History   Tobacco Use  . Smoking status: Never Smoker  . Smokeless tobacco: Never Used  Substance Use Topics  . Alcohol use: Yes    Alcohol/week: 14.0 standard drinks    Types: 14 Standard drinks or equivalent per week    Comment: 1/2 gallon of wine PTA  . Drug use: No     Allergies  Patient has no known allergies.   Review of Systems Review of Systems  Unable to perform ROS: Mental status change     Physical Exam Updated Vital Signs BP (!) 156/96   Pulse (!) 133   Temp 98.6 F (37 C) (Oral)   Resp (!) 26   SpO2 93%   Physical Exam Vitals signs and nursing note reviewed.  Constitutional:      General: He is not in acute distress.    Appearance: He is well-developed. He is diaphoretic (Mild).  HENT:     Head: Normocephalic and atraumatic.  Eyes:     General: No scleral icterus.       Right eye: No discharge.        Left eye: No discharge.     Conjunctiva/sclera: Conjunctivae normal.  Neck:     Musculoskeletal: Normal  range of motion.  Cardiovascular:     Rate and Rhythm: Regular rhythm. Bradycardia present.     Pulses: Normal pulses.     Heart sounds: Normal heart sounds.  Pulmonary:     Effort: Pulmonary effort is normal. No respiratory distress.     Breath sounds: Normal breath sounds. No stridor.  Abdominal:     General: There is no distension.     Palpations: There is no mass.     Tenderness: There is no abdominal tenderness.     Hernia: No hernia is present.  Musculoskeletal:        General: No deformity.  Skin:    General: Skin is warm.  Neurological:     GCS: GCS eye subscore is 3. GCS verbal subscore is 4. GCS motor subscore is 6.     Motor: No abnormal muscle tone.     Comments: 5/5 grip strength equal bilaterally.  He is able to lift his bilateral legs off the bed.   He awakens to loud voice, speech is very slurred and difficult to understand.  He knows who he is, where he is, and what year it is.    Psychiatric:     Comments: Unable to assess secondary to AMS.       ED Treatments / Results  Labs (all labs ordered are listed, but only abnormal results are displayed) Labs Reviewed  COMPREHENSIVE METABOLIC PANEL - Abnormal; Notable for the following components:      Result Value   CO2 21 (*)    BUN <5 (*)    All other components within normal limits  ETHANOL - Abnormal; Notable for the following components:   Alcohol, Ethyl (B) 411 (*)    All other components within normal limits  URINALYSIS, ROUTINE W REFLEX MICROSCOPIC - Abnormal; Notable for the following components:   Color, Urine STRAW (*)    Specific Gravity, Urine 1.003 (*)    Hgb urine dipstick SMALL (*)    All other components within normal limits  RAPID URINE DRUG SCREEN, HOSP PERFORMED - Abnormal; Notable for the following components:   Benzodiazepines POSITIVE (*)    All other components within normal limits  LACTIC ACID, PLASMA - Abnormal; Notable for the following components:   Lactic Acid, Venous 3.8 (*)     All other components within normal limits  ACETAMINOPHEN LEVEL - Abnormal; Notable for the following components:   Acetaminophen (Tylenol), Serum <10 (*)    All other components within normal limits  LACTIC ACID, PLASMA - Abnormal; Notable for the following components:   Lactic Acid, Venous 3.2 (*)    All other components within  normal limits  SARS CORONAVIRUS 2 (HOSPITAL ORDER, [facility])  LIPASE, BLOOD  PROTIME-INR  SALICYLATE LEVEL  CK  LACTIC ACID, PLASMA  LACTIC ACID, PLASMA  HIV ANTIBODY (ROUTINE TESTING W REFLEX)  RPR  CBG MONITORING, ED    EKG None  Radiology Ct Head Wo Contrast  Result Date: 11/19/2018 CLINICAL DATA:  Altered level of consciousness. EXAM: CT HEAD WITHOUT CONTRAST TECHNIQUE: Contiguous axial images were obtained from the base of the skull through the vertex without intravenous contrast. COMPARISON:  01/23/2017 FINDINGS: Brain: No evidence of acute infarction, hemorrhage, hydrocephalus, extra-axial collection or mass lesion/mass effect. Vascular: No hyperdense vessel or unexpected calcification. Skull: Normal. Negative for fracture or focal lesion. Sinuses/Orbits: No acute finding. Other: None IMPRESSION: 1. No acute intracranial abnormalities.  Normal brain. Electronically Signed   By: Kerby Moors M.D.   On: 11/19/2018 18:58   Dg Chest Port 1 View  Result Date: 11/19/2018 CLINICAL DATA:  ETOH, weakness EXAM: PORTABLE CHEST 1 VIEW COMPARISON:  12/14/2016 FINDINGS: Cardiomegaly. Both lungs are clear. The visualized skeletal structures are unremarkable. IMPRESSION: Cardiomegaly without acute abnormality of the lungs in AP portable projection. Electronically Signed   By: Eddie Candle M.D.   On: 11/19/2018 18:23    Procedures Procedures (including critical care time)  Medications Ordered in ED Medications  sodium chloride 0.9 % bolus 1,000 mL (0 mLs Intravenous Stopped 11/19/18 1827)  thiamine (B-1) injection 100 mg (100 mg  Intravenous Given 11/19/18 1755)  sodium chloride 0.9 % bolus 1,000 mL (1,000 mLs Intravenous New Bag/Given 11/19/18 2303)     Initial Impression / Assessment and Plan / ED Course  I have reviewed the triage vital signs and the nursing notes.  Pertinent labs & imaging results that were available during my care of the patient were reviewed by me and considered in my medical decision making (see chart for details).  Clinical Course as of Nov 19 2350  Sat Nov 19, 2018  1943 Patient reevaluated.  He is more alert.  He is still slurring his speech however he is able to answer questions more appropriately and is more understandable.  He reports that when he got up today he felt down and sad.  He did not have a specific trigger.  He states that he knows that he frequently uses alcohol to cover up emotional pain and distress.  He denies SI, HI, or AVH.   When I ask him why he is using a walker he tells me that he recently has felt like his calves are not as sturdy as usual.   [EH]  2151 Patient reevaluated, he says that he feels like he is at his baseline.  He reports that when coronavirus started becoming widespread he says that he spent 2 weeks in bed without getting up other than to use the bathroom and since then he has felt weak.  He says that he had fallen once in the time he is using a walker as he is very concerned.   [EH]  2151 Patient gave me permission to speak with his brother Dallas Breeding who was the one who called ambulance on him today.   [EH]  2153 Spoke with Brother Cedrick, he reports that patient has been dealing with depression for many years, drinking heavily for 2-3 years.   He states that patient's girlfriend is out of town and when he went to check on patient today he found patient on the floor, unsure how long patient was on  the floor.    [EH]  2220 Attempted to ambulate patient with tach.  He has a wide gait with incoordination of his legs.  He uses a walker and will lean forward.   When he tried to walk without the walker he became very off balance.   [EH]    Clinical Course User Index [EH] Lorin Glass, PA-C      Patient presents today for evaluation of alcohol intoxication.  He is known to frequently drink large amounts of alcohol.  He reportedly drank a liter and a half of wine shortly prior to EMS arrival.  Patient was unable to provide significant history, however he was able to maintain his airway.  He was monitored on cardiac monitoring, pulse ox, and end-tidal without significant worsening of condition.    His lactic acid was elevated at 3.8, suspect a significant amount of this is ethanol related.  His alcohol level was elevated at 411.  He is tachycardic and tachypneic while here.  According to brother he has not been eating much, suspect that this may be partially due to dehydration.  As he was unable to be screened for coronavirus, due to his altered mental status, and hospital coronavirus testing was ordered which was negative.  His lipase is not significantly elevated.  Acetaminophen and salicylate levels are undetectable.  As he was found on the floor by his brother and unsure how long he was therefore CK was obtained which was not elevated at 154.  UDS positive for benzos.  AST, ALT, alk phos are all normal.  He does not have any significant electrolyte or hematologic derangements.  Chest x-ray was obtained without evidence of aspiration or other abnormalities.  He was treated with 1 L of IV fluids after which he still remained tachycardic.  He is given a second liter of IV fluids and allowed p.o. intake.  I attempted to ambulate patient and he was off balance.  He stated that he drinks because he was feeling sad.  Brother reports that his depression got much worse after 1 of their parents passed away.    TTS consult was ordered, pending metabolization of alcohol.  At shift change care was transferred to [facility] who will follow pending studies,  re-evaulate and determine disposition.     Final Clinical Impressions(s) / ED Diagnoses   Final diagnoses:  Alcoholic intoxication without complication [facility])  ETOH abuse    ED Discharge Orders    None       Lorin Glass, [place] 11/19/18 2357

## 2018-11-19 NOTE — ED Triage Notes (Signed)
EMS reports pt has been Living off ETOH for the last couple of weeks, generalized weakness, using walker to ambulate, remains intoxicated, 1.5 liters of wine in last hour and half. 180/120-120-93% RA CBG 114 98.4 oral

## 2018-11-20 ENCOUNTER — Inpatient Hospital Stay (HOSPITAL_COMMUNITY): Payer: Self-pay

## 2018-11-20 ENCOUNTER — Other Ambulatory Visit: Payer: Self-pay

## 2018-11-20 ENCOUNTER — Encounter (HOSPITAL_COMMUNITY): Payer: Self-pay | Admitting: Family Medicine

## 2018-11-20 DIAGNOSIS — I10 Essential (primary) hypertension: Secondary | ICD-10-CM | POA: Diagnosis present

## 2018-11-20 DIAGNOSIS — R270 Ataxia, unspecified: Secondary | ICD-10-CM

## 2018-11-20 DIAGNOSIS — I517 Cardiomegaly: Secondary | ICD-10-CM

## 2018-11-20 DIAGNOSIS — F10232 Alcohol dependence with withdrawal with perceptual disturbance: Secondary | ICD-10-CM

## 2018-11-20 LAB — HEMOGLOBIN A1C
Hgb A1c MFr Bld: 5.7 % — ABNORMAL HIGH (ref 4.8–5.6)
Mean Plasma Glucose: 116.89 mg/dL

## 2018-11-20 LAB — CBC WITH DIFFERENTIAL/PLATELET
Abs Immature Granulocytes: 0.08 10*3/uL — ABNORMAL HIGH (ref 0.00–0.07)
Basophils Absolute: 0.1 10*3/uL (ref 0.0–0.1)
Basophils Relative: 0 %
Eosinophils Absolute: 0 10*3/uL (ref 0.0–0.5)
Eosinophils Relative: 0 %
HCT: 37.1 % — ABNORMAL LOW (ref 39.0–52.0)
Hemoglobin: 12.5 g/dL — ABNORMAL LOW (ref 13.0–17.0)
Immature Granulocytes: 1 %
Lymphocytes Relative: 8 %
Lymphs Abs: 1.1 10*3/uL (ref 0.7–4.0)
MCH: 35 pg — ABNORMAL HIGH (ref 26.0–34.0)
MCHC: 33.7 g/dL (ref 30.0–36.0)
MCV: 103.9 fL — ABNORMAL HIGH (ref 80.0–100.0)
Monocytes Absolute: 0.9 10*3/uL (ref 0.1–1.0)
Monocytes Relative: 6 %
Neutro Abs: 11.7 10*3/uL — ABNORMAL HIGH (ref 1.7–7.7)
Neutrophils Relative %: 85 %
Platelets: 366 10*3/uL (ref 150–400)
RBC: 3.57 MIL/uL — ABNORMAL LOW (ref 4.22–5.81)
RDW: 13.7 % (ref 11.5–15.5)
WBC: 13.9 10*3/uL — ABNORMAL HIGH (ref 4.0–10.5)
nRBC: 0 % (ref 0.0–0.2)

## 2018-11-20 LAB — CREATININE, SERUM
Creatinine, Ser: 0.64 mg/dL (ref 0.61–1.24)
GFR calc Af Amer: >60 mL/min (ref >60)
GFR calc non Af Amer: >60 mL/min (ref >60)

## 2018-11-20 LAB — LACTIC ACID, PLASMA
Lactic Acid, Venous: 3 mmol/L (ref 0.5–1.9)
Lactic Acid, Venous: 3.2 mmol/L (ref 0.5–1.9)
Lactic Acid, Venous: 1.7 mmol/L (ref 0.5–1.9)

## 2018-11-20 LAB — TSH: TSH: 4.439 u[IU]/mL (ref 0.350–4.500)

## 2018-11-20 LAB — BRAIN NATRIURETIC PEPTIDE: B Natriuretic Peptide: 14.8 pg/mL (ref 0.0–100.0)

## 2018-11-20 LAB — HIV ANTIBODY (ROUTINE TESTING W REFLEX): HIV Screen 4th Generation wRfx: NONREACTIVE

## 2018-11-20 LAB — PHOSPHORUS: Phosphorus: 3.2 mg/dL (ref 2.5–4.6)

## 2018-11-20 LAB — MAGNESIUM: Magnesium: 1.6 mg/dL — ABNORMAL LOW (ref 1.7–2.4)

## 2018-11-20 LAB — ECHOCARDIOGRAM COMPLETE

## 2018-11-20 LAB — CK: Total CK: 143 U/L (ref 49–397)

## 2018-11-20 LAB — RPR: RPR Ser Ql: NONREACTIVE

## 2018-11-20 MED ORDER — LACTATED RINGERS IV BOLUS
1000.0000 mL | Freq: Once | INTRAVENOUS | Status: AC
  Administered 2018-11-20: 1000 mL via INTRAVENOUS

## 2018-11-20 MED ORDER — LABETALOL HCL 5 MG/ML IV SOLN
10.0000 mg | Freq: Once | INTRAVENOUS | Status: AC
  Administered 2018-11-20: 10 mg via INTRAVENOUS
  Filled 2018-11-20: qty 4

## 2018-11-20 MED ORDER — LORAZEPAM 2 MG/ML IJ SOLN: 0.0000 mg | Freq: Two times a day (BID) | INTRAMUSCULAR | Status: DC

## 2018-11-20 MED ORDER — SODIUM CHLORIDE 0.9% FLUSH
3.0000 mL | Freq: Two times a day (BID) | INTRAVENOUS | Status: DC
  Administered 2018-11-20 – 2018-12-06 (×31): 3 mL via INTRAVENOUS

## 2018-11-20 MED ORDER — MAGNESIUM CITRATE PO SOLN: 1.0000 | Freq: Once | ORAL | Status: DC | PRN

## 2018-11-20 MED ORDER — KETOROLAC TROMETHAMINE 15 MG/ML IJ SOLN
15.0000 mg | Freq: Four times a day (QID) | INTRAMUSCULAR | Status: DC | PRN
  Administered 2018-11-20: 15 mg via INTRAVENOUS
  Filled 2018-11-20: qty 1

## 2018-11-20 MED ORDER — ACETAMINOPHEN 650 MG RE SUPP: 650.0000 mg | Freq: Four times a day (QID) | RECTAL | Status: DC | PRN

## 2018-11-20 MED ORDER — ASPIRIN EC 81 MG PO TBEC: 81.0000 mg | DELAYED_RELEASE_TABLET | Freq: Every day | ORAL | Status: DC

## 2018-11-20 MED ORDER — LABETALOL HCL 200 MG PO TABS
300.0000 mg | ORAL_TABLET | Freq: Two times a day (BID) | ORAL | Status: DC
  Administered 2018-11-20 – 2018-11-28 (×17): 300 mg via ORAL
  Filled 2018-11-20 (×19): qty 1

## 2018-11-20 MED ORDER — HYDRALAZINE HCL 50 MG PO TABS
50.0000 mg | ORAL_TABLET | Freq: Once | ORAL | Status: AC
  Administered 2018-11-20: 50 mg via ORAL
  Filled 2018-11-20: qty 1

## 2018-11-20 MED ORDER — ACETAMINOPHEN 325 MG PO TABS
650.0000 mg | ORAL_TABLET | Freq: Four times a day (QID) | ORAL | Status: DC | PRN
  Administered 2018-11-20 – 2018-12-03 (×3): 650 mg via ORAL
  Filled 2018-11-20 (×3): qty 2

## 2018-11-20 MED ORDER — LEVALBUTEROL HCL 0.63 MG/3ML IN NEBU: 0.6300 mg | INHALATION_SOLUTION | Freq: Four times a day (QID) | RESPIRATORY_TRACT | Status: DC | PRN

## 2018-11-20 MED ORDER — ONDANSETRON HCL 4 MG PO TABS: 4.0000 mg | ORAL_TABLET | Freq: Four times a day (QID) | ORAL | Status: DC | PRN

## 2018-11-20 MED ORDER — FOLIC ACID 1 MG PO TABS
1.0000 mg | ORAL_TABLET | Freq: Every day | ORAL | Status: DC
  Administered 2018-11-20 – 2018-11-26 (×7): 1 mg via ORAL
  Filled 2018-11-20 (×7): qty 1

## 2018-11-20 MED ORDER — LORAZEPAM 1 MG PO TABS: 0.0000 mg | ORAL_TABLET | Freq: Two times a day (BID) | ORAL | Status: DC

## 2018-11-20 MED ORDER — HEPARIN SODIUM (PORCINE) 5000 UNIT/ML IJ SOLN
5000.0000 [IU] | Freq: Three times a day (TID) | INTRAMUSCULAR | Status: DC
  Administered 2018-11-20 – 2018-12-07 (×50): 5000 [IU] via SUBCUTANEOUS
  Filled 2018-11-20 (×50): qty 1

## 2018-11-20 MED ORDER — ENSURE ENLIVE PO LIQD: 237.0000 mL | Freq: Two times a day (BID) | ORAL | Status: DC

## 2018-11-20 MED ORDER — LABETALOL HCL 300 MG PO TABS
300.0000 mg | ORAL_TABLET | Freq: Once | ORAL | Status: AC
  Administered 2018-11-20: 300 mg via ORAL
  Filled 2018-11-20: qty 1

## 2018-11-20 MED ORDER — SODIUM CHLORIDE 0.9 % IV SOLN
INTRAVENOUS | Status: DC
  Administered 2018-11-20: 16:00:00 via INTRAVENOUS

## 2018-11-20 MED ORDER — HYDRALAZINE HCL 50 MG PO TABS
50.0000 mg | ORAL_TABLET | Freq: Three times a day (TID) | ORAL | Status: DC
  Administered 2018-11-20 – 2018-11-28 (×23): 50 mg via ORAL
  Filled 2018-11-20 (×23): qty 1

## 2018-11-20 MED ORDER — ALBUTEROL SULFATE (2.5 MG/3ML) 0.083% IN NEBU: 2.5000 mg | INHALATION_SOLUTION | RESPIRATORY_TRACT | Status: DC | PRN

## 2018-11-20 MED ORDER — HYDRALAZINE HCL 20 MG/ML IJ SOLN
10.0000 mg | Freq: Four times a day (QID) | INTRAMUSCULAR | Status: DC | PRN
  Administered 2018-11-20 – 2018-11-21 (×3): 10 mg via INTRAVENOUS
  Filled 2018-11-20 (×3): qty 1

## 2018-11-20 MED ORDER — ADULT MULTIVITAMIN W/MINERALS CH
1.0000 | ORAL_TABLET | Freq: Every day | ORAL | Status: DC
  Administered 2018-11-20 – 2018-11-26 (×7): 1 via ORAL
  Filled 2018-11-20 (×7): qty 1

## 2018-11-20 MED ORDER — DOCUSATE SODIUM 100 MG PO CAPS
100.0000 mg | ORAL_CAPSULE | Freq: Two times a day (BID) | ORAL | Status: DC
  Administered 2018-11-20 – 2018-12-07 (×30): 100 mg via ORAL
  Filled 2018-11-20 (×33): qty 1

## 2018-11-20 MED ORDER — VITAMIN B-1 100 MG PO TABS
100.0000 mg | ORAL_TABLET | Freq: Every day | ORAL | Status: DC
  Administered 2018-11-20 – 2018-12-07 (×18): 100 mg via ORAL
  Filled 2018-11-20 (×18): qty 1

## 2018-11-20 MED ORDER — THIAMINE HCL 100 MG/ML IJ SOLN: Freq: Once | INTRAVENOUS | Status: DC

## 2018-11-20 MED ORDER — SORBITOL 70 % SOLN
30.0000 mL | Freq: Every day | Status: DC | PRN
  Filled 2018-11-20: qty 30

## 2018-11-20 MED ORDER — METOPROLOL TARTRATE 5 MG/5ML IV SOLN
5.0000 mg | Freq: Once | INTRAVENOUS | Status: AC
  Administered 2018-11-20: 5 mg via INTRAVENOUS
  Filled 2018-11-20: qty 5

## 2018-11-20 MED ORDER — LORAZEPAM 2 MG/ML IJ SOLN
0.0000 mg | Freq: Four times a day (QID) | INTRAMUSCULAR | Status: DC
  Administered 2018-11-20 – 2018-11-21 (×5): 2 mg via INTRAVENOUS
  Administered 2018-11-21: 1 mg via INTRAVENOUS
  Filled 2018-11-20 (×6): qty 1

## 2018-11-20 MED ORDER — LORAZEPAM 2 MG/ML IJ SOLN
1.0000 mg | Freq: Once | INTRAMUSCULAR | Status: AC
  Administered 2018-11-20: 1 mg via INTRAVENOUS
  Filled 2018-11-20: qty 1

## 2018-11-20 MED ORDER — THIAMINE HCL 100 MG/ML IJ SOLN
500.0000 mg | Freq: Once | INTRAVENOUS | Status: AC
  Administered 2018-11-20: 500 mg via INTRAVENOUS
  Filled 2018-11-20: qty 5

## 2018-11-20 MED ORDER — HYDROCODONE-ACETAMINOPHEN 5-325 MG PO TABS
1.0000 | ORAL_TABLET | ORAL | Status: DC | PRN
  Administered 2018-12-02: 22:00:00 2 via ORAL
  Administered 2018-12-02 (×2): 1 via ORAL
  Filled 2018-11-20 (×2): qty 1
  Filled 2018-11-20: qty 2

## 2018-11-20 MED ORDER — LORAZEPAM 1 MG PO TABS
0.0000 mg | ORAL_TABLET | Freq: Four times a day (QID) | ORAL | Status: DC
  Administered 2018-11-20: 1 mg via ORAL
  Filled 2018-11-20: qty 1

## 2018-11-20 MED ORDER — ONDANSETRON HCL 4 MG/2ML IJ SOLN: 4.0000 mg | Freq: Four times a day (QID) | INTRAMUSCULAR | Status: DC | PRN

## 2018-11-20 MED ORDER — FAMOTIDINE 20 MG PO TABS
20.0000 mg | ORAL_TABLET | Freq: Two times a day (BID) | ORAL | Status: DC
  Administered 2018-11-20 – 2018-12-07 (×34): 20 mg via ORAL
  Filled 2018-11-20 (×34): qty 1

## 2018-11-20 MED ORDER — SODIUM CHLORIDE 0.9 % IV BOLUS
1000.0000 mL | Freq: Once | INTRAVENOUS | Status: AC
  Administered 2018-11-20: 1000 mL via INTRAVENOUS

## 2018-11-20 MED ORDER — SENNOSIDES-DOCUSATE SODIUM 8.6-50 MG PO TABS: 1.0000 | ORAL_TABLET | Freq: Every evening | ORAL | Status: DC | PRN

## 2018-11-20 MED ORDER — PANTOPRAZOLE SODIUM 40 MG PO TBEC
40.0000 mg | DELAYED_RELEASE_TABLET | Freq: Every day | ORAL | Status: DC
  Administered 2018-11-20: 40 mg via ORAL
  Filled 2018-11-20: qty 1

## 2018-11-20 NOTE — ED Notes (Signed)
Attempted to call nursing report to 5e, no answer.

## 2018-11-20 NOTE — Progress Notes (Signed)
Consult request has been received. CSW attempting to follow up at present time  Genora Arp M. Mickell Birdwell LCSWA Transitions of Care  Clinical Social Worker  Ph: [phone] 

## 2018-11-20 NOTE — Progress Notes (Signed)
Pt's BP still 163/107, HR 130s, RR 24, Temp 100.8. PRN Hydralazine given with some improvement. Pt resting calmly in bed, states he feels "good." Obvious tremors to BUE. MD paged to be made aware. Will given IV Ativan when able and continue to monitor.

## 2018-11-20 NOTE — ED Notes (Signed)
Attempted to call nursing report, reports nurse will call when ready to receive.

## 2018-11-20 NOTE — ED Notes (Signed)
tss called Am psych evaluation

## 2018-11-20 NOTE — ED Notes (Signed)
ECHO at bedside.

## 2018-11-20 NOTE — ED Provider Notes (Signed)
3:24 AM Patient reassessed.  He is resting comfortably.  Heart rate still 135 to 145 bpm.  He is persistently hypertensive with systolic blood pressure in the 170s.  On chart review, patient is on a daily beta-blocker.  He states that taking his medications is "hit or miss".  He did not take his labetalol yesterday as prescribed.  Will give 300 mg labetalol in the ED and hydrate with additional liter of IV fluids.  Patient also receive 1 mg IV Ativan given concern for potential developing withdrawal.  Currently is not experiencing tremors.  5:50 AM Tachycardia and HTN have been improving. Pending TTS recommendations.  6:18 AM  Heart rate has improved down to 116 bpm blood pressure also improving with oral medications.  He has been evaluated by TTS who recommend psychiatric assessment in the morning.    On chart review, the patient has been tachycardic during prior visits.  On review of his labs, his lactate has been trending down.  It was also discovered that he never received a CBC on arrival.  This has been ordered.    If CBC stable without acute abnormality, the patient may be appropriate for medical clearance.  His daily medications have been ordered in addition to CIWA protocol.  Patient to be signed out to Sharen Heck, PA-C at shift change who will assume care and disposition appropriately.    Vitals:   11/20/18 0515 11/20/18 0530 11/20/18 0545 11/20/18 0600  BP: 138/90 (!) 143/101 (!) 135/59 139/85  Pulse: (!) 118 (!) 116 (!) 117 (!) 119  Resp:      Temp:      TempSrc:      SpO2: 97% 92% 93% 100%      Antony Madura, PA-C 11/20/18 8588    Marily Memos, MD 11/20/18 [id number]

## 2018-11-20 NOTE — ED Notes (Addendum)
Spoke with hospitalist Shahmehdi MD. Give 1 dose Labetaol 10 mg IV now for HR, Pt to get PO ordered Labetaol once to floor.

## 2018-11-20 NOTE — H&P (Signed)
History and Physical   Patient: Brett Molina                            PCP: Patient, No Pcp Per                    DOB: 01/25/1991            DOA: 11/19/2018 [id number]             DOS: 11/20/2018, 9:45 AM  Patient coming from:   Home  I have personally reviewed patient's medical records, in electronic medical records, including: [facility], and care everywhere.    Chief Complaint:   Chief Complaint  Patient presents with  . Alcohol Intoxication    History of present illness:    Brett Molina is a 28 y.o. male with medical history significant of   hypertension, GERD, chronic alcohol abuse, history of upper GI, and depression presented to the ED on 5/16 in  alcohol intoxication state. Patient was kept in ED treated with CIWA protocol, IV fluid hydration, was noted foot tachycardia, hypertension, no tremors.  Subsequently patient reported that he has not been able to ambulate in the past 2 to 3 weeks, has borrowed a walker to assist him in ambulation. Admits that his drinking habit history started at age 28.  But in the past 2 months  he has been drinking heavily. He has confess that he drank 1.5 L of wine an hour before arrival to the ED.  He reports that he drinks 1.5 L of wine daily for past 2 months, as he was laid off from his job due to pandemic.  Otherwise patient stable aside from tachycardia, hypertension. Patient reports of no recent illnesses denies any fever, chills, nausea or vomiting.  Denies having any upper respiratory symptoms such as cough congestion sneezing or headaches.  Currently denies of having any headaches visual changes or asymmetric weaknesses, just generalized weaknesses when unable to ambulate without assist.  Denies any chest pain or shortness of breath.  As of any abdominal pain constipation or diarrhea.  Denies any dysuria.  Denies any joint pain or open wounds.  ED Course:   Patient was initially presented to the ED on 5/16, and  alcohol intoxicated state, aggressively hydrated, started on CIWA protocol. Had a mild leukocytosis of WBC of 13.9 with elevated lactic acid 3.2, no other signs of infection UA was clean chest x-ray cardiomegaly negative for infiltrate, afebrile Patient does not have hypertension, with tachycardia,... Also reported of severe ataxia for past 2 to 3 weeks. Neurology Dr. Luisa Hart was consulted, commended imaging of the head.   SARS-CoV-2 target nucleic acids are NOT DETECTED.  Subsequently requested for patient to be admitted for further evaluation evaluation and detox.  Review of Systems: As per HPI otherwise 12 point review of systems negative.    Assessment / Plan:   Principal Problem:   Ataxia /in a setting of alcohol intoxication, call use and abuse/dependence -Patient will be admitted, treating underlying ETOH withdrawal and detox -Continue neurochecks -CT head was inconclusive -He has consulted and called neurology Dr. Petra Kuba who recommended, MRI of the head -Thiamine level was not withdrawn before initiating and treating with thiamine -PT/OT for evaluation recommendation -Ruling out a Warnicke Korsakoff syndrome    Alcoholic intoxication without complication (HCC) / Alcoholic hepatitis without ascites /  Alcohol abuse -Patient will be continued on CIWA protocol, along with thiamine  and folate -PRN additional Ativan, continue to be symptomatic, consider additional medication such as Librium taper -If severe withdrawal effect, we will consider initiating Precedex -Continue to monitor very closely -Psych has been consulted for evaluation recommendations and follow-up  Persistent sinus tachycardia -Likely due to withdrawal effect from alcohol -Continue to monitor, on CIWA protocol -Bleeding electrolytes -Tenuous home beta-blocker medication of labetalol -EKG was reviewed -Imaging revealing cardiomegaly, will obtain 2D echocardiogram   Lactic acidosis  -[place] due to  dehydration, alcohol intoxication -Signs of infection, will continue to monitor -Continue IV fluid hydration   Leukocytosis -Likely reactive no signs of infection, afebrile, mildly hypertensive -UA within normal limits, chest x-ray cardiomegaly negative for any infiltrate -Continue to monitor closely, repeat CBC, abstaining from antibiotic use at this time   HTN (hypertension) -Stable, continue home medication of labetalol, hydralazine -On hydralazine IV for systolic blood pressure greater than 160 -Continue to monitor closely  History of major depression disorder -Currently not on any medication, denies any homicidal suicidal ideation -Psych was consulted, appreciate their input, documentation   Gastroesophageal reflux disease with esophagitis -Continue PPI    DVT prophylaxis: SCD/Compression stockings and Heparin SQ  Code Status:   Code Status: Full Code  Family Communication:  The above findings and plan of care has been discussed with patient and family in detail, they expressed understanding and agreement of above plan.   Disposition Plan: >3 days  Consults called:  Neurology Dr. Petra Kuba was called by the ED staff  Admission status: Patient will be admitted as Inpatient, with a greater than 2 midnight length of stay.  Patient meets the inpatient criteria as he is going through severe withdrawal from alcohol use/abuse, currently tachycardic, hypertensive, lactic acidosis, dehydration, neurological findings such as ataxia which needs further work-up and evaluations.  ----------------------------------------------------------------------------------------------------------------------  No Known Allergies  Home MEDs:  Prior to Admission medications   Medication Sig Start Date End Date Taking? Authorizing Provider  chlordiazePOXIDE (LIBRIUM) 25 MG capsule Take 1 tablet 3 times a day for 2 days then 1 tablet 2 times a day for 2 days and then 1 tablet daily till done.  08/16/18   Darlin Drop, DO  famotidine (PEPCID) 20 MG tablet Take 1 tablet (20 mg total) by mouth 2 (two) times daily for 30 days. 08/16/18 09/15/18  Darlin Drop, DO  folic acid (FOLVITE) 1 MG tablet Take 1 tablet (1 mg total) by mouth daily. 08/16/18   Darlin Drop, DO  hydrALAZINE (APRESOLINE) 25 MG tablet Take 2 tablets (50 mg total) by mouth 3 (three) times daily for 30 days. 08/16/18 09/15/18  Darlin Drop, DO  labetalol (NORMODYNE) 300 MG tablet Take 1 tablet (300 mg total) by mouth 2 (two) times daily. 08/16/18   Darlin Drop, DO  Multiple Vitamin (MULTIVITAMIN WITH MINERALS) TABS tablet Take 1 tablet by mouth daily. 08/16/18   Darlin Drop, DO  pantoprazole (PROTONIX) 40 MG tablet Take 1 tablet (40 mg total) by mouth daily at 6 (six) AM. 08/16/18   Darlin Drop, DO  thiamine 100 MG tablet Take 1 tablet (100 mg total) by mouth daily. 08/16/18   Darlin Drop, DO    PRN MEDs: acetaminophen **OR** acetaminophen, albuterol, HYDROcodone-acetaminophen, ketorolac, levalbuterol, magnesium citrate, ondansetron **OR** ondansetron (ZOFRAN) IV, senna-docusate, sorbitol  Past Medical History:  Diagnosis Date  . ETOH abuse   . Hypertension     Past Surgical History:  Procedure Laterality Date  . ESOPHAGOGASTRODUODENOSCOPY N/A 07/24/2018   Procedure:  ESOPHAGOGASTRODUODENOSCOPY (EGD);  Surgeon: Rachael FeeJacobs, Daniel P, MD;  Location: [facility];  Service: Endoscopy;  Laterality: N/A;     reports that he has never smoked. He has never used smokeless tobacco. He reports current alcohol use of about 14.0 standard drinks of alcohol per week. He reports that he does not use drugs.   History reviewed. No pertinent family history.  Physical Exam:     Constitutional: NAD, calm, comfortable Vitals:   11/20/18 0845 11/20/18 0900 11/20/18 0912 11/20/18 0930  BP: (!) 163/103 (!) 166/104 (!) 166/104 (!) 172/100  Pulse: (!) 131 (!) 132 (!) 132 (!) 134  Resp: (!) 28 19  17   Temp:      TempSrc:       SpO2: 95% 94%  99%   Eyes: PERRL, lids and conjunctivae normal ENMT: Mucous membranes are moist. Posterior pharynx clear of any exudate or lesions.Normal dentition.  Neck: normal, supple, no masses, no thyromegaly Respiratory: clear to auscultation bilaterally, no wheezing, no crackles. Normal respiratory effort. No accessory muscle use.  Cardiovascular: Sinus tachycardic, no murmurs / rubs / gallops. No extremity edema. 2+ pedal pulses. No carotid bruits.  Abdomen: no tenderness, no masses palpated. No hepatosplenomegaly. Bowel sounds positive.  Musculoskeletal: no clubbing / cyanosis. No joint deformity upper and lower extremities. Good ROM, no contractures. Normal muscle tone.  Unable to stand or ambulate at this time Neurologic: CN II-XII grossly intact. Sensation intact, DTR normal. Strength tacked in upper extremities, able to move lower extremities in bed, unable to stand up without assist at this time  sychiatric: Normal judgment and insight. Alert and oriented x 3.  Withdrawn, but denies having any homicidal suicidal ideation Skin: no rashes, lesions, ulcers. No induration Decubitus/ulcers: None visible Urinary catheter: none   Labs on admission:    I have personally reviewed following labs and imaging studies  CBC: Recent Labs  Lab 11/20/18 0621  WBC 13.9*  NEUTROABS 11.7*  HGB 12.5*  HCT 37.1*  MCV 103.9*  PLT 366   Basic Metabolic Panel: Recent Labs  Lab 11/19/18 1745  NA 143  K 3.5  CL 107  CO2 21*  GLUCOSE 99  BUN <5*  CREATININE 0.67  CALCIUM 9.2   GFR: CrCl cannot be calculated (Unknown ideal weight.). Liver Function Tests: Recent Labs  Lab 11/19/18 1745  AST 31  ALT 39  ALKPHOS 67  BILITOT 0.3  PROT 7.9  ALBUMIN 4.3   Recent Labs  Lab 11/19/18 1745  LIPASE 29   No results for input(s): AMMONIA in the last 168 hours. Coagulation Profile: Recent Labs  Lab 11/19/18 1745  INR 1.0   Cardiac Enzymes: Recent Labs  Lab 11/19/18 1744   CKTOTAL 154   BN CBG: Recent Labs  Lab 11/19/18 2141  GLUCAP 81    Urine analysis:    Component Value Date/Time   COLORURINE STRAW (A) 11/19/2018 1749   APPEARANCEUR CLEAR 11/19/2018 1749   LABSPEC 1.003 (L) 11/19/2018 1749   PHURINE 6.0 11/19/2018 1749   GLUCOSEU NEGATIVE 11/19/2018 1749   HGBUR SMALL (A) 11/19/2018 1749   BILIRUBINUR NEGATIVE 11/19/2018 1749   KETONESUR NEGATIVE 11/19/2018 1749   PROTEINUR NEGATIVE 11/19/2018 1749   NITRITE NEGATIVE 11/19/2018 1749   LEUKOCYTESUR NEGATIVE 11/19/2018 1749     Radiologic Exams on Admission:   Ct Head Wo Contrast  Result Date: 11/19/2018 CLINICAL DATA:  Altered level of consciousness. EXAM: CT HEAD WITHOUT CONTRAST TECHNIQUE: Contiguous axial images were obtained from the base of the skull through  the vertex without intravenous contrast. COMPARISON:  01/23/2017 FINDINGS: Brain: No evidence of acute infarction, hemorrhage, hydrocephalus, extra-axial collection or mass lesion/mass effect. Vascular: No hyperdense vessel or unexpected calcification. Skull: Normal. Negative for fracture or focal lesion. Sinuses/Orbits: No acute finding. Other: None IMPRESSION: 1. No acute intracranial abnormalities.  Normal brain. Electronically Signed   By: Signa Kell M.D.   On: 11/19/2018 18:58   Dg Chest Port 1 View  Result Date: 11/19/2018 CLINICAL DATA:  ETOH, weakness EXAM: PORTABLE CHEST 1 VIEW COMPARISON:  12/14/2016 FINDINGS: Cardiomegaly. Both lungs are clear. The visualized skeletal structures are unremarkable. IMPRESSION: Cardiomegaly without acute abnormality of the lungs in AP portable projection. Electronically Signed   By: Lauralyn Primes M.D.   On: 11/19/2018 18:23    EKG:   Independently reviewed.   Orders placed or performed during the hospital encounter of 11/19/18  . EKG 12-Lead  . EKG 12-Lead  . EKG 12-Lead  . EKG 12-Lead  . ED EKG  . ED EKG  . EKG 12-Lead     Time spent: > than  50  Min.   Kendell Bane MD  [facility] ,  Pager [phone]  If 7PM-7AM, please contact night-coverage [email or web address]  Password [facility] 11/20/2018, 9:45 AM

## 2018-11-20 NOTE — ED Notes (Signed)
ED TO INPATIENT HANDOFF REPORT  Name/Age/Gender Brett Molina 28 y.o. male  Code Status    Code Status Orders  (From admission, onward)         Start     Ordered   11/20/18 0933  Full code  Continuous     11/20/18 0936        Code Status History    Date Active Date Inactive Code Status Order ID Comments User Context   11/20/2018 0616 11/20/2018 0936 Full Code [id number]  Darylene Price ED   08/13/2018 2042 08/16/2018 1725 Full Code [id number]  Rometta Emery, MD Inpatient   07/23/2018 2243 07/25/2018 1852 Full Code [id number]  Elder Love, MD Inpatient   02/16/2017 1249 02/18/2017 1624 Full Code [id number]  Carron Curie, MD Inpatient   12/12/2016 1235 12/29/2016 1915 Full Code [id number]  Alison Murray, MD Inpatient   12/12/2016 1234 12/12/2016 1235 Full Code [id number]  Oneta Rack, NP Inpatient   12/11/2016 1335 12/12/2016 1234 Full Code [id number]  [facility]      Home/SNF/Other Home  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  Level of Care/Admitting Diagnosis ED Disposition    ED Disposition Condition Comment   Admit  Hospital Area: [facility] [[id number]]  Level of Care: Telemetry [5]  Admit to tele based on following criteria: Complex arrhythmia (Bradycardia/Tachycardia)  Covid Evaluation: N/A  Diagnosis: Ataxia [742595]  Admitting Physician: Felipa Furnace  Attending Physician: Felipa Furnace  Estimated length of stay: 3 - 4 days  Certification:: I certify this patient will need inpatient services for at least 2 midnights  PT Class (Do Not Modify): Inpatient [101]  PT Acc Code (Do Not Modify): Private [1]       Medical History Past Medical History:  Diagnosis Date  . ETOH abuse   . Hypertension     Allergies No Known Allergies  IV Location/Drains/Wounds Patient Lines/Drains/Airways Status   Active Line/Drains/Airways    Name:   Placement date:   Placement time:   Site:   Days:    Peripheral IV 11/19/18 Left Wrist   11/19/18    1743    Wrist   1          Labs/Imaging Results for orders placed or performed during the hospital encounter of 11/19/18 (from the past 48 hour(s))  Urine rapid drug screen (hosp performed)     Status: Abnormal   Collection Time: 11/19/18  5:37 PM  Result Value Ref Range   Opiates NONE DETECTED NONE DETECTED   Cocaine NONE DETECTED NONE DETECTED   Benzodiazepines POSITIVE (A) NONE DETECTED   Amphetamines NONE DETECTED NONE DETECTED   Tetrahydrocannabinol NONE DETECTED NONE DETECTED   Barbiturates NONE DETECTED NONE DETECTED    Comment: (NOTE) DRUG SCREEN FOR MEDICAL PURPOSES ONLY.  IF CONFIRMATION IS NEEDED FOR ANY PURPOSE, NOTIFY LAB WITHIN 5 DAYS. LOWEST DETECTABLE LIMITS FOR URINE DRUG SCREEN Drug Class                     Cutoff (ng/mL) Amphetamine and metabolites    1000 Barbiturate and metabolites    200 Benzodiazepine                 200 Tricyclics and metabolites     300 Opiates and metabolites        300 Cocaine and metabolites        300 THC  50 Performed at [facility], [address], [place], [place] [address]   CK     Status: None   Collection Time: 11/19/18  5:44 PM  Result Value Ref Range   Total CK 154 49 - 397 U/L    Comment: Performed at [facility], [address], [place], [place] [address]  Comprehensive metabolic panel     Status: Abnormal   Collection Time: 11/19/18  5:45 PM  Result Value Ref Range   Sodium 143 135 - 145 mmol/L   Potassium 3.5 3.5 - 5.1 mmol/L   Chloride 107 98 - 111 mmol/L   CO2 21 (L) 22 - 32 mmol/L   Glucose, Bld 99 70 - 99 mg/dL   BUN <5 (L) 6 - 20 mg/dL   Creatinine, Ser 3.55 0.61 - 1.24 mg/dL   Calcium 9.2 8.9 - 97.4 mg/dL   Total Protein 7.9 6.5 - 8.1 g/dL   Albumin 4.3 3.5 - 5.0 g/dL   AST 31 15 - 41 U/L   ALT 39 0 - 44 U/L   Alkaline Phosphatase 67 38 - 126 U/L   Total Bilirubin 0.3 0.3 - 1.2 mg/dL    GFR calc non Af Amer >60 >60 mL/min   GFR calc Af Amer >60 >60 mL/min   Anion gap 15 5 - 15    Comment: Performed at [facility], [address], [place], [place] [address]  Ethanol     Status: Abnormal   Collection Time: 11/19/18  5:45 PM  Result Value Ref Range   Alcohol, Ethyl (B) 411 (HH) <10 mg/dL    Comment: CRITICAL RESULT CALLED TO, READ BACK BY AND VERIFIED WITH: HALL,C @ 1900 ON [id number] BY WILKINS,V (NOTE) Lowest detectable limit for serum alcohol is 10 mg/dL. For medical purposes only. Performed at [facility], [address] [place], [place] [address]   Lipase, blood     Status: None   Collection Time: 11/19/18  5:45 PM  Result Value Ref Range   Lipase 29 11 - 51 U/L    Comment: Performed at [facility], [address] [place], [place] [address]  Protime-INR     Status: None   Collection Time: 11/19/18  5:45 PM  Result Value Ref Range   Prothrombin Time 13.5 11.4 - 15.2 seconds   INR 1.0 0.8 - 1.2    Comment: (NOTE) INR goal varies based on device and disease states. Performed at [facility], [address], [place], [place] [address]   Lactic acid, plasma     Status: Abnormal   Collection Time: 11/19/18  5:45 PM  Result Value Ref Range   Lactic Acid, Venous 3.8 (HH) 0.5 - 1.9 mmol/L    Comment: CRITICAL RESULT CALLED TO, READ BACK BY AND VERIFIED WITH: HALL,C @ 1859 ON [id number] BY Geoffery Spruce Performed at [facility], [address] [place], [place] [address]   Acetaminophen level     Status: Abnormal   Collection Time: 11/19/18  5:45 PM  Result Value Ref Range   Acetaminophen (Tylenol), Serum <10 (L) 10 - 30 ug/mL    Comment: (NOTE) Therapeutic concentrations vary significantly. A range of 10-30 ug/mL  may be an effective concentration for many patients. However, some  are best treated at concentrations outside of this range. Acetaminophen concentrations  >150 ug/mL at 4 hours after ingestion  and >50 ug/mL at 12 hours after ingestion are often associated with  toxic reactions. Performed at [facility], [address] [place], [place] [address]   Salicylate level     Status: None   Collection Time: 11/19/18  5:45 PM  Result Value Ref Range   Salicylate Lvl <7.0 2.8 - 30.0 mg/dL    Comment: Performed at [facility], [address] [place], [place] [address]  SARS Coronavirus 2 (CEPHEID - Performed in [facility] hospital lab), Hosp Order     Status: None   Collection Time: 11/19/18  5:45 PM  Result Value Ref Range   SARS Coronavirus 2 NEGATIVE NEGATIVE    Comment: (NOTE) If result is NEGATIVE SARS-CoV-2 target nucleic acids are NOT DETECTED. The SARS-CoV-2 RNA is generally detectable in upper and lower  respiratory specimens during the acute phase of infection. The lowest  concentration of SARS-CoV-2 viral copies this assay can detect is 250  copies / mL. A negative result does not preclude SARS-CoV-2 infection  and should not be used as the sole basis for treatment or other  patient management decisions.  A negative result may occur with  improper specimen collection / handling, submission of specimen other  than nasopharyngeal swab, presence of viral mutation(s) within the  areas targeted by this assay, and inadequate number of viral copies  (<250 copies / mL). A negative result must be combined with clinical  observations, patient history, and epidemiological information. If result is POSITIVE SARS-CoV-2 target nucleic acids are DETECTED. The SARS-CoV-2 RNA is generally detectable in upper and lower  respiratory specimens dur ing the acute phase of infection.  Positive  results are indicative of active infection with SARS-CoV-2.  Clinical  correlation with patient history and other diagnostic information is  necessary to determine patient infection status.  Positive results do  not rule  out bacterial infection or co-infection with other viruses. If result is PRESUMPTIVE POSTIVE SARS-CoV-2 nucleic acids MAY BE PRESENT.   A presumptive positive result was obtained on the submitted specimen  and confirmed on repeat testing.  While 2019 novel coronavirus  (SARS-CoV-2) nucleic acids may be present in the submitted sample  additional confirmatory testing may be necessary for epidemiological  and / or clinical management purposes  to differentiate between  SARS-CoV-2 and other Sarbecovirus currently known to infect humans.  If clinically indicated additional testing with an alternate test  methodology [phone]([id number]) is advised. The SARS-CoV-2 RNA is generally  detectable in upper and lower respiratory sp ecimens during the acute  phase of infection. The expected result is Negative. Fact Sheet for Patients:  BoilerBrush.com.cyhttps://www.fda.gov/media/136312/download Fact Sheet for Healthcare Providers: https://pope.com/https://www.fda.gov/media/136313/download This test is not yet approved or cleared by the [place] FDA and has been authorized for detection and/or diagnosis of SARS-CoV-2 by FDA under an Emergency Use Authorization (EUA).  This EUA will remain in effect (meaning this test can be used) for the duration of the COVID-19 declaration under Section 564(b)(1) of the Act, 21 U.S.C. section 360bbb-3(b)(1), unless the authorization is terminated or revoked sooner. Performed at [facility], [address] [place], [place] [address]   Urinalysis, Routine w reflex microscopic     Status: Abnormal   Collection Time: 11/19/18  5:49 PM  Result Value Ref Range   Color, Urine STRAW (A) YELLOW   APPearance CLEAR CLEAR   Specific Gravity, Urine 1.003 (L) 1.005 - 1.030   pH 6.0 5.0 - 8.0   Glucose, UA NEGATIVE NEGATIVE mg/dL   Hgb urine dipstick SMALL (A) NEGATIVE  Bilirubin Urine NEGATIVE NEGATIVE   Ketones, ur NEGATIVE NEGATIVE mg/dL   Protein, ur NEGATIVE NEGATIVE mg/dL   Nitrite  NEGATIVE NEGATIVE   Leukocytes,Ua NEGATIVE NEGATIVE   RBC / HPF 0-5 0 - 5 RBC/hpf   WBC, UA 0-5 0 - 5 WBC/hpf   Bacteria, UA NONE SEEN NONE SEEN    Comment: Performed at [facility], [address], [place], [place] [address]  Lactic acid, plasma     Status: Abnormal   Collection Time: 11/19/18  9:23 PM  Result Value Ref Range   Lactic Acid, Venous 3.2 (HH) 0.5 - 1.9 mmol/L    Comment: CRITICAL RESULT CALLED TO, READ BACK BY AND VERIFIED WITH: Dyane Dustman RN 2226 11/19/2018 HILL K Performed at [facility], [address] [place], [place] [address]   CBG monitoring, ED     Status: None   Collection Time: 11/19/18  9:41 PM  Result Value Ref Range   Glucose-Capillary 81 70 - 99 mg/dL  Lactic acid, plasma     Status: Abnormal   Collection Time: 11/19/18 11:23 PM  Result Value Ref Range   Lactic Acid, Venous 3.0 (HH) 0.5 - 1.9 mmol/L    Comment: CRITICAL RESULT CALLED TO, READ BACK BY AND VERIFIED WITH: NASH,J RN @ [phone] ON [id number] BY POTEAT,S Performed at [facility], [address] [place], [place] [address]   Lactic acid, plasma     Status: Abnormal   Collection Time: 11/20/18  5:50 AM  Result Value Ref Range   Lactic Acid, Venous 3.2 (HH) 0.5 - 1.9 mmol/L    Comment: CRITICAL RESULT CALLED TO, READ BACK BY AND VERIFIED WITH: VAUGHN,L AT 0827 ON 11/20/2018 BY MOSLEY,J Performed at [facility], [address], [place], [place] [address]   CBC with Differential     Status: Abnormal   Collection Time: 11/20/18  6:21 AM  Result Value Ref Range   WBC 13.9 (H) 4.0 - 10.5 K/uL   RBC 3.57 (L) 4.22 - 5.81 MIL/uL   Hemoglobin 12.5 (L) 13.0 - 17.0 g/dL   HCT 60.0 (L) 45.9 - 97.7 %   MCV 103.9 (H) 80.0 - 100.0 fL   MCH 35.0 (H) 26.0 - 34.0 pg   MCHC 33.7 30.0 - 36.0 g/dL   RDW 41.4 23.9 - 53.2 %   Platelets 366 150 - 400 K/uL   nRBC 0.0 0.0 - 0.2 %   Neutrophils Relative % 85 %   Neutro Abs 11.7 (H) 1.7 -  7.7 K/uL   Lymphocytes Relative 8 %   Lymphs Abs 1.1 0.7 - 4.0 K/uL   Monocytes Relative 6 %   Monocytes Absolute 0.9 0.1 - 1.0 K/uL   Eosinophils Relative 0 %   Eosinophils Absolute 0.0 0.0 - 0.5 K/uL   Basophils Relative 0 %   Basophils Absolute 0.1 0.0 - 0.1 K/uL   Immature Granulocytes 1 %   Abs Immature Granulocytes 0.08 (H) 0.00 - 0.07 K/uL    Comment: Performed at [facility], [address] [place], [place] [address]   Ct Head Wo Contrast  Result Date: 11/19/2018 CLINICAL DATA:  Altered level of consciousness. EXAM: CT HEAD WITHOUT CONTRAST TECHNIQUE: Contiguous axial images were obtained from the base of the skull through the vertex without intravenous contrast. COMPARISON:  01/23/2017 FINDINGS: Brain: No evidence of acute infarction, hemorrhage, hydrocephalus, extra-axial collection or mass lesion/mass effect. Vascular: No hyperdense vessel or unexpected calcification. Skull: Normal. Negative  for fracture or focal lesion. Sinuses/Orbits: No acute finding. Other: None IMPRESSION: 1. No acute intracranial abnormalities.  Normal brain. Electronically Signed   By: Signa Kell M.D.   On: 11/19/2018 18:58   Dg Chest Port 1 View  Result Date: 11/19/2018 CLINICAL DATA:  ETOH, weakness EXAM: PORTABLE CHEST 1 VIEW COMPARISON:  12/14/2016 FINDINGS: Cardiomegaly. Both lungs are clear. The visualized skeletal structures are unremarkable. IMPRESSION: Cardiomegaly without acute abnormality of the lungs in AP portable projection. Electronically Signed   By: Lauralyn Primes M.D.   On: 11/19/2018 18:23    Pending Labs Unresulted Labs (From admission, onward)    Start     Ordered   11/21/18 0500  Basic metabolic panel  Daily,   R     11/20/18 0936   11/21/18 0500  CBC  Daily,   R     11/20/18 0936   11/21/18 0500  Protime-INR  Tomorrow morning,   R     11/20/18 0936   11/21/18 0500  APTT  Tomorrow morning,   R     11/20/18 0936   11/20/18 0933  Creatinine, serum   (heparin)  Once,   R    Comments:  Baseline for heparin therapy IF NOT ALREADY DRAWN.    11/20/18 0936   11/20/18 0933  Magnesium  Once,   R     11/20/18 0936   11/20/18 0933  Phosphorus  Once,   R     11/20/18 0936   11/20/18 0933  Brain natriuretic peptide  Once,   R     11/20/18 0936   11/20/18 0933  TSH  Once,   R     11/20/18 0936   11/20/18 0933  Hemoglobin A1c  Once,   R     11/20/18 0936   11/20/18 0928  CK  Once,   R     11/20/18 0927   11/20/18 0906  Vitamin B1  Once,   R     11/20/18 0905   11/19/18 2233  HIV antibody  Once,   STAT     11/19/18 2233   11/19/18 2233  RPR  ONCE - STAT,   STAT     11/19/18 2233          Vitals/Pain Today's Vitals   11/20/18 0912 11/20/18 0914 11/20/18 0930 11/20/18 1000  BP: (!) 166/104  (!) 172/100 (!) 142/111  Pulse: (!) 132  (!) 134 (!) 139  Resp:   17 (!) 22  Temp:      TempSrc:      SpO2:   99% 96%  PainSc:  0-No pain      Isolation Precautions No active isolations  Medications Medications  famotidine (PEPCID) tablet 20 mg (20 mg Oral Given 11/20/18 0908)  labetalol (NORMODYNE) tablet 300 mg (has no administration in time range)  folic acid (FOLVITE) tablet 1 mg (1 mg Oral Given 11/20/18 0908)  thiamine (VITAMIN B-1) tablet 100 mg (100 mg Oral Given 11/20/18 0908)  multivitamin with minerals tablet 1 tablet (1 tablet Oral Given 11/20/18 0909)  LORazepam (ATIVAN) injection 0-4 mg (2 mg Intravenous Given 11/20/18 0914)    Or  LORazepam (ATIVAN) tablet 0-4 mg ( Oral See Alternative 11/20/18 0914)  LORazepam (ATIVAN) injection 0-4 mg (has no administration in time range)    Or  LORazepam (ATIVAN) tablet 0-4 mg (has no administration in time range)  heparin injection 5,000 Units (has no administration in time range)  sodium chloride flush (NS) 0.9 %  injection 3 mL (has no administration in time range)  acetaminophen (TYLENOL) tablet 650 mg (has no administration in time range)    Or  acetaminophen (TYLENOL) suppository 650  mg (has no administration in time range)  HYDROcodone-acetaminophen (NORCO/VICODIN) 5-325 MG per tablet 1-2 tablet (has no administration in time range)  ketorolac (TORADOL) 15 MG/ML injection 15 mg (has no administration in time range)  docusate sodium (COLACE) capsule 100 mg (has no administration in time range)  senna-docusate (Senokot-S) tablet 1 tablet (has no administration in time range)  sorbitol 70 % solution 30 mL (has no administration in time range)  ondansetron (ZOFRAN) tablet 4 mg (has no administration in time range)    Or  ondansetron (ZOFRAN) injection 4 mg (has no administration in time range)  albuterol (PROVENTIL) (2.5 MG/3ML) 0.083% nebulizer solution 2.5 mg (has no administration in time range)  levalbuterol (XOPENEX) nebulizer solution 0.63 mg (has no administration in time range)  hydrALAZINE (APRESOLINE) tablet 50 mg (has no administration in time range)  lactated ringers bolus 1,000 mL (has no administration in time range)  sodium chloride 0.9 % 1,000 mL with thiamine 100 mg, folic acid 1 mg, multivitamins adult 10 mL infusion (has no administration in time range)  sodium chloride 0.9 % bolus 1,000 mL (0 mLs Intravenous Stopped 11/19/18 1827)  thiamine (B-1) injection 100 mg (100 mg Intravenous Given 11/19/18 1755)  sodium chloride 0.9 % bolus 1,000 mL (0 mLs Intravenous Stopped 11/20/18 0049)  sodium chloride 0.9 % bolus 1,000 mL (0 mLs Intravenous Stopped 11/20/18 0401)  LORazepam (ATIVAN) injection 1 mg (1 mg Intravenous Given 11/20/18 0325)  labetalol (NORMODYNE) tablet 300 mg (300 mg Oral Given 11/20/18 0342)  hydrALAZINE (APRESOLINE) tablet 50 mg (50 mg Oral Given 11/20/18 0446)    Mobility walks with device

## 2018-11-20 NOTE — Consult Note (Signed)
Neurology Consultation Reason for Consult: Unsteady gait Referring Physician: Isac Sarna  CC: Unsteady gait  History is obtained from: Patient  HPI: Brett Molina is a 28 y.o. male with a history of alcohol abuse who has been drinking quite a lot lately because of the chest down (1.5 L of wine per day) who woke up 1 morning a few weeks ago with unsteady gait.  Since that time it has marginally improved, but due to still not being able to do what he normally does, he came to the emergency department yesterday.  On arrival, his alcohol level was 411 and was initially attributed to this but he continues to have difficulties even after his alcohol level has decreased.  He denies numbness, double vision, numbness, or any other symptoms.    He denies any other drug use other than alcohol.  ROS: A 14 point ROS was performed and is negative except as noted in the HPI.  Past Medical History:  Diagnosis Date  . ETOH abuse   . Hypertension      History reviewed. No pertinent family history.   Social History:  reports that he has never smoked. He has never used smokeless tobacco. He reports current alcohol use of about 14.0 standard drinks of alcohol per week. He reports that he does not use drugs.   Exam: Current vital signs: BP (!) 164/110 (BP Location: Right Arm)   Pulse (!) 126   Temp (!) 100.5 F (38.1 C) (Oral)   Resp (!) 24   Ht 6\' 2"  (1.88 m)   Wt 97.3 kg   SpO2 97%   BMI 27.55 kg/m  Vital signs in last 24 hours: Temp:  [98.4 F (36.9 C)-100.5 F (38.1 C)] 100.5 F (38.1 C) (05/17 1422) Pulse Rate:  [115-143] 126 (05/17 1422) Resp:  [11-33] 24 (05/17 1422) BP: (118-196)/(59-125) 164/110 (05/17 1422) SpO2:  [92 %-100 %] 97 % (05/17 1422) Weight:  [97.3 kg] 97.3 kg (05/17 1136)   Physical Exam  Constitutional: Appears well-developed and well-nourished.  Psych: Affect appropriate to situation Eyes: No scleral injection HENT: No OP obstrucion Head:  Normocephalic.  Cardiovascular: Normal rate and regular rhythm.  Respiratory: Effort normal, non-labored breathing GI: Soft.  No distension. There is no tenderness.  Skin: WDI  Neuro: Mental Status: Patient is awake, alert, oriented to person, place, month, year, and situation. Patient is able to give a clear and coherent history. No signs of aphasia or neglect Cranial Nerves: II: Visual Fields are full. Pupils are equal, round, and reactive to light.   III,IV, VI: EOMI without ptosis or diploplia.  No nystagmus V: Facial sensation is symmetric to temperature VII: Facial movement is symmetric.  VIII: hearing is intact to voice X: Uvula elevates symmetrically XI: Shoulder shrug is symmetric. XII: tongue is midline without atrophy or fasciculations.  Motor: Tone is normal. Bulk is normal. 5/5 strength was present in all four extremities.  Sensory: Sensation is symmetric to light touch and temperature in the arms and legs. Deep Tendon Reflexes: 2+ and symmetric in the biceps and patellae.  Plantars: Toes are downgoing bilaterally.  Cerebellar: He has marked tremulousness bilaterally without past-pointing.   I have reviewed labs in epic and the results pertinent to this consultation are: Elevated lactate Leukocytosis at 13.9 Mild macrocytic anemia at 12.5    I have reviewed the images obtained: CT head-unremarkable  Impression: 28 year old male with difficulty walking in the setting of chronic alcohol use.  He has multiple other laboratory findings  which make me think that he may have a nonneurological process contributing, including leukocytosis, tachycardia, mild fever.  The tremor could be consistent with a mild ataxia, but appears more to be tremulousness to me.  It is too early for him to be going through alcohol withdrawal, but he may simply be very hung over.  He does not have any other findings concerning for Wernicke's encephalopathy, therefore high-dose thiamine at  100 mg daily should be adequate, but will give a single dose of 500 mg.  Recommendations: 1) thiamine 500 mg x 1 2) CIWA protocol as I suspect that withdrawal is very likely 3) echo work-up for causes of leukocytosis, tachycardia, lactic acidosis 4) MRI brain and cervical spine   Ritta Slot, MD [facility] [phone]  If 7pm- 7am, please page neurology on call as listed in AMION.

## 2018-11-20 NOTE — ED Notes (Addendum)
Date and time results received: 11/20/18 8:27 AM  Test: Lactic Acid  Critical Value: 3.2  Name of Provider Notified: [place], [place] and Lajuana Ripple

## 2018-11-20 NOTE — BH Assessment (Signed)
Tele Assessment Note   Patient Name: Brett Molina MRN: [id number] Referring Physician: Antony Madura, PA-C Location of Patient: [facility] Location of Provider: Behavioral Health TTS Department  Brett Molina is an 28 y.o. male presenting with alcohol intoxication. Patient BAL was 411. Patient denied SI, HI and psychosis. Patient came in voluntary. Patient reported drinking 1.5 liters of wine in last 1 hour and half prior to coming in to ED. Patient reported drinking 1.5 liters of wine daily for the past 2 months after he was laid off from his job due to the pandemic. Patient reported started drinking alcohol at 28 years of age, however drinking didn't become a problem until 28 years old. Onset of this episode was 2 months. Patient shared no other stress factors. Patient reported increased depressive symptoms of guilt, fatigue and loss of interest. Patient reported attending AA services, last meeting was in 05/2018. Patient denied prior inpatient mental health or substance abuse. Patient denied history of suicide attempts and self-harming behaviors. Patient reported currently residing with his girlfriend. Patient was cooperative during TTS assessment.  Triage RN Note: EMS reports pt has been Living off ETOH for the last couple of weeks, generalized weakness, using walker to ambulate, remains intoxicated, 1.5 liters of wine in last hour and half. 180/120-120-93% RA CBG 114 98.4 oral.  BAL 411 UDS +benzos  Collateral Contact Arianna, girlfriend, does not have contact number  Diagnosis: Alcohol abuse  Past Medical History:  Past Medical History:  Diagnosis Date  . ETOH abuse   . Hypertension     Past Surgical History:  Procedure Laterality Date  . ESOPHAGOGASTRODUODENOSCOPY N/A 07/24/2018   Procedure: ESOPHAGOGASTRODUODENOSCOPY (EGD);  Surgeon: Rachael Fee, MD;  Location: [facility];  Service: Endoscopy;  Laterality: N/A;    Family History: No family history on  file.  Social History:  reports that he has never smoked. He has never used smokeless tobacco. He reports current alcohol use of about 14.0 standard drinks of alcohol per week. He reports that he does not use drugs.  Additional Social History:  Alcohol / Drug Use Pain Medications: see MAR Prescriptions: see MAR Over the Counter: see MAR  CIWA: CIWA-Ar BP: (!) 155/102 Pulse Rate: (!) 121 Nausea and Vomiting: no nausea and no vomiting Tactile Disturbances: mild itching, pins and needles, burning or numbness Tremor: two Auditory Disturbances: not present Paroxysmal Sweats: barely perceptible sweating, palms moist Visual Disturbances: not present Anxiety: no anxiety, at ease Headache, Fullness in Head: none present Agitation: normal activity Orientation and Clouding of Sensorium: oriented and can do serial additions CIWA-Ar Total: 5 COWS:    Allergies: No Known Allergies  Home Medications: (Not in a hospital admission)   OB/GYN Status:  No LMP for male patient.  General Assessment Data Assessment unable to be completed: Yes Reason for not completing assessment: (BAL 411) Location of Assessment: [facility] TTS Assessment: In system Is this a Tele or Face-to-Face Assessment?: Tele Assessment Is this an Initial Assessment or a Re-assessment for this encounter?: Initial Assessment Patient Accompanied by:: N/A Language Other than English: No Living Arrangements: (with girlfriend) What gender do you identify as?: Male Marital status: Single Living Arrangements: Spouse/significant other Can pt return to current living arrangement?: Yes Admission Status: Voluntary Is patient capable of signing voluntary admission?: Yes Referral Source: Self/Family/Friend     Crisis Care Plan Living Arrangements: Spouse/significant other Legal Guardian: (self) Name of Psychiatrist: (none) Name of Therapist: (none)  Education Status Is patient currently in school?: No Is the  patient employed,  unemployed or receiving disability?: Unemployed  Risk to self with the past 6 months Suicidal Ideation: No Has patient been a risk to self within the past 6 months prior to admission? : No Suicidal Intent: No Has patient had any suicidal intent within the past 6 months prior to admission? : No Is patient at risk for suicide?: No Suicidal Plan?: No Has patient had any suicidal plan within the past 6 months prior to admission? : No Access to Means: No What has been your use of drugs/alcohol within the last 12 months?: (1.5 liters of wine daily) Previous Attempts/Gestures: No How many times?: (0) Other Self Harm Risks: (n/a) Triggers for Past Attempts: None known Intentional Self Injurious Behavior: None Family Suicide History: Unknown Recent stressful life event(s): Job Loss(2 months lost job due to pandemic) Persecutory voices/beliefs?: No Depression: Yes Depression Symptoms: Guilt, Fatigue, Loss of interest in usual pleasures Substance abuse history and/or treatment for substance abuse?: No Suicide prevention information given to non-admitted patients: Not applicable  Risk to Others within the past 6 months Homicidal Ideation: No Does patient have any lifetime risk of violence toward others beyond the six months prior to admission? : No Thoughts of Harm to Others: No Current Homicidal Intent: No Current Homicidal Plan: No Access to Homicidal Means: No Identified Victim: (n/a) History of harm to others?: No Assessment of Violence: None Noted Violent Behavior Description: (n/a) Does patient have access to weapons?: No Criminal Charges Pending?: No Does patient have a court date: No Is patient on probation?: No  Psychosis Hallucinations: None noted Delusions: None noted  Mental Status Report Appearance/Hygiene: Unremarkable Eye Contact: Fair Motor Activity: Freedom of movement Speech: Logical/coherent Level of Consciousness: Alert Mood: Depressed Affect: Depressed,  Appropriate to circumstance Anxiety Level: Minimal Thought Processes: Coherent, Relevant Judgement: Impaired Orientation: Person, Place, Time, Situation Obsessive Compulsive Thoughts/Behaviors: None  Cognitive Functioning Concentration: Normal Memory: Recent Intact Is patient IDD: No Insight: Poor Impulse Control: Poor Appetite: Good Have you had any weight changes? : No Change Sleep: No Change Total Hours of Sleep: (8) Vegetative Symptoms: Staying in bed  ADLScreening [facility] Assessment Services) Patient's cognitive ability adequate to safely complete daily activities?: Yes Patient able to express need for assistance with ADLs?: Yes Independently performs ADLs?: Yes (appropriate for developmental age)  Prior Inpatient Therapy Prior Inpatient Therapy: No  Prior Outpatient Therapy Prior Outpatient Therapy: No Does patient have an ACCT team?: No Does patient have Intensive In-House Services?  : No Does patient have [facility] services? : No Does patient have P4CC services?: No  ADL Screening (condition at time of admission) Patient's cognitive ability adequate to safely complete daily activities?: Yes Patient able to express need for assistance with ADLs?: Yes Independently performs ADLs?: Yes (appropriate for developmental age)   Disposition:  Disposition Initial Assessment Completed for this Encounter: Yes  Nanine Means, DNP, recommends observation for safety and stabilization. Patient would stay at [facility] due to safety and time of TTS assessment. Nanine Means, DNP, will reassess in the a.m. Tad Moore, informed of disposition. Fayrene Fearing, RN, informed of disposition.  This service was provided via telemedicine using a 2-way, interactive audio and video technology.  Names of all persons participating in this telemedicine service and their role in this encounter. Name: Brett Molina Role: Patient  Name: Al Corpus, [facility] Role: TTS Clinician  Name:  Role:   Name:  Role:      Burnetta Sabin, [facility] 11/20/2018 5:25 AM

## 2018-11-20 NOTE — ED Notes (Signed)
Pt provided with meal tray.

## 2018-11-20 NOTE — Progress Notes (Signed)
MD at bedside upon admit to 4E and aware of abnormal VS. Also per MD, OK to wait until tomorrow for ordered MRI.

## 2018-11-20 NOTE — ED Notes (Signed)
Neurologist at bedside. 

## 2018-11-20 NOTE — TOC Initial Note (Signed)
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 Pt states that prior to being admitted he resided at home with his girlfriend. Pt goes into detail and explains that he recently lost his job due to COVID-19. Pt reports this occurrence along with death in the family, has serves as a trigger for his alcohol use. Pt also states that "bordom" serves as a trigger for his alcohol use.    Pt has limited support and limited access to resources. Pt explains that he does not have a PCP nor insurance.   Pt is familiar with AA meetings and reports that he has attended a few. Pt has no hx of mental health treatment and is unsure about his willingness to engage in outpatient MH treatment.   Pt is receptive to online AA treatment.   CSW used motivational interviewing techniques to engage pt in meaningful conversation and to assess pt's wilingess to abstain from alcohol. CSW explored pt's coping skills and access to community services.   CSW will provide pt with information regarding online AA meetings.  Viviano Simas, MSW, [facility]  Clinical Social Worker [phone]  Expected Discharge Plan: Home/Self Care Barriers to Discharge: No Barriers Identified   Patient Goals and CMS Choice Patient states their goals for this hospitalization and ongoing recovery are:: to return home while making use of substance abuse resources      Expected Discharge Plan and Services Expected Discharge Plan: Home/Self Care In-house Referral: Clinical Social Work, [facility] Discharge Planning Services: CM Consult                                          Prior Living Arrangements/Services   Lives  with:: Domestic Partner Patient language and need for interpreter reviewed:: Yes Do you feel safe going back to the place where you live?: Yes      Need for Family Participation in Patient Care: No (Comment) Care giver support system in place?: Yes (comment)   Criminal Activity/Legal Involvement Pertinent to Current Situation/Hospitalization: No - Comment as needed  Activities of Daily Living   ADL Screening (condition at time of admission) Patient's cognitive ability adequate to safely complete daily activities?: Yes Patient able to express need for assistance with ADLs?: Yes Independently performs ADLs?: Yes (appropriate for developmental age)  Permission Sought/Granted                  Emotional Assessment Appearance:: Appears stated age Attitude/Demeanor/Rapport: Engaged Affect (typically observed): Accepting, Sad, Calm, Hopeless Orientation: : Oriented to Self, Oriented to  Time, Oriented to Situation, Oriented to Place Alcohol / Substance Use: Alcohol Use Psych Involvement: No (comment)  Admission diagnosis:  ETOH abuse [F10.10] Alcoholic intoxication without complication (HCC) [F10.920] Patient Active Problem List   Diagnosis Date Noted  . 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 HTN (hypertension) 11/20/2018  . UGIB (upper gastrointestinal bleed) 08/13/2018  . Intractable vomiting with nausea   . Gastroesophageal reflux disease with esophagitis   . GI bleed 07/23/2018  . Upper GI bleeding 07/23/2018  . Alcohol abuse 02/18/2017  . Alcohol withdrawal (HCC) 02/16/2017  . Alcoholic  intoxication without complication (HCC) 12/12/2016  . Hypokalemia 12/12/2016  . Alcoholic hepatitis without ascites 12/12/2016  . MDD (major depressive disorder) 12/12/2016   PCP:  Patient, No Pcp Per Pharmacy:   [facility] [place], [place] - [address] [place] [address] Phone: [phone] Fax: [phone]  [id number] [place], [place]  - [address] AT [facility] [address] [place] [address] Phone: [phone] Fax: [phone]     Social Determinants of Health (SDOH) Interventions    Readmission Risk Interventions No flowsheet data found.

## 2018-11-20 NOTE — ED Notes (Signed)
Hospitalist notified of pt HR

## 2018-11-20 NOTE — Progress Notes (Signed)
  Echocardiogram 2D Echocardiogram has been performed.  Leta Jungling M 11/20/2018, 10:55 AM

## 2018-11-20 NOTE — ED Notes (Signed)
Date and time results received: 11/20/18 0204 (use smartphrase ".now" to insert current time)  Test: Lactic Acid Critical Value: 3.0  Name of Provider Notified: Messner MD  Orders Received? Or Actions Taken?: waiting on orders

## 2018-11-20 NOTE — ED Provider Notes (Addendum)
2992: Hand off from previous ED PA. See previous 2 notes for full details. Briefly, pt seen for ETOH intoxication 411.  Found on the floor by gf, unknown down time. Persistently tachycardic and hypertensive which led to extensive work up which was remarkable with for elevated lactic acid, leukocytosis. Infectious work up including COVID negative. Has received 3 L IVF, ativan. Has been non compliant with labetalol and hydralazine. Lactic acid trending down.  Pending CBC and repeat lactic, otherwise deemed med cleared and ready for TTS for concern of worsening mood/depression leading to heavy drinking. HR and hypertension trending down as well thought to be from ETOH intoxication, dehydration. Per previous evaluations pt does not appear to be withdrawing here.  He has been drinking to the point where he has been minimally active and now somewhat deconditioned thought to lead to gait unsteadiness.  He is requiring a walker to walk x 2 weeks. Walking trial done by previous PA. Plan to f/u on CBC and lactic, ambulate make sure he's not anemic since he has h/o GIB. Otherwise can be med cleared for TTS. He has been drinking more due to worsening mood/depression.   Physical Exam  BP (!) 167/125   Pulse (!) 132   Temp 98.6 F (37 C) (Oral)   Resp (!) 31   SpO2 97%   Physical Exam Constitutional:      Appearance: He is well-developed. He is not toxic-appearing.  HENT:     Head: Normocephalic.     Right Ear: External ear normal.     Left Ear: External ear normal.     Nose: Nose normal.  Eyes:     Conjunctiva/sclera: Conjunctivae normal.  Neck:     Musculoskeletal: Full passive range of motion without pain.  Cardiovascular:     Rate and Rhythm: Tachycardia present.     Comments: HR 130s  Pulmonary:     Effort: Pulmonary effort is normal. No tachypnea or respiratory distress.     Breath sounds: Normal breath sounds.  Musculoskeletal: Normal range of motion.  Skin:    General: Skin is warm and dry.      Capillary Refill: Capillary refill takes less than 2 seconds.  Neurological:     Mental Status: He is alert.     Gait: Gait abnormal.     Comments: +Gait and finger to nose ataxia.  Cannot stand up on his own without leaning on walker.   Alert and oriented to self, place, time and event.  Speech is fluent without dysarthria or dysphasia. Strength 5/5 with hand grip and ankle F/E.   Sensation to light touch intact in face, hands and feet. No pronator drift. No leg drop. CN I not tested CN II grossly intact visual fields bilaterally. Unable to visualize posterior eye. CN III, IV, VI PEERL and EOMs intact bilaterally CN V light touch intact in all 3 divisions of trigeminal nerve CN VII facial movements symmetric CN VIII not tested CN IX, X no uvula deviation, symmetric rise of soft palate  CN XI 5/5 SCM and trapezius strength bilaterally  CN XII Midline tongue protrusion, symmetric L/R movements  Psychiatric:        Behavior: Behavior normal.        Thought Content: Thought content normal.     ED Course/Procedures   Clinical Course as of Nov 19 1112  Sat Nov 19, 2018  1943 Patient reevaluated.  He is more alert.  He is still slurring his speech however he is able to  answer questions more appropriately and is more understandable.  He reports that when he got up today he felt down and sad.  He did not have a specific trigger.  He states that he knows that he frequently uses alcohol to cover up emotional pain and distress.  He denies SI, HI, or AVH.   When I ask him why he is using a walker he tells me that he recently has felt like his calves are not as sturdy as usual.   [EH]  2151 Patient reevaluated, he says that he feels like he is at his baseline.  He reports that when coronavirus started becoming widespread he says that he spent 2 weeks in bed without getting up other than to use the bathroom and since then he has felt weak.  He says that he had fallen once in the time he is  using a walker as he is very concerned.   [EH]  2151 Patient gave me permission to speak with his brother Sharyl Nimrod who was the one who called ambulance on him today.   [EH]  2153 Spoke with Brother Cedrick, he reports that patient has been dealing with depression for many years, drinking heavily for 2-3 years.   He states that patient's girlfriend is out of town and when he went to check on patient today he found patient on the floor, unsure how long patient was on the floor.    [EH]  2220 Attempted to ambulate patient with tach.  He has a wide gait with incoordination of his legs.  He uses a walker and will lean forward.  When he tried to walk without the walker he became very off balance.   [EH]  Sun Nov 20, 2018  8022 Spoke to Dr Amada Jupiter  - expresses concern for possible WE/Korsakoff.  He would like thiamine level drawn, but thiamine given to him prior to drawing this in ED unfortunately may not be accurate. Recommends MRI w/o contrast and admission to the hospital for other medical issues. Will draw thiamine, consult hospitalist. Discussed with patient who is in agreement with this.    [CG]    Clinical Course User Index [CG] Liberty Handy, PA-C [EH] Cristina Gong, PA-C    .Critical Care Performed by: Liberty Handy, PA-C Authorized by: Liberty Handy, PA-C   Critical care provider statement:    Critical care time (minutes):  45   Critical care was necessary to treat or prevent imminent or life-threatening deterioration of the following conditions:  CNS failure or compromise (concern for WE/Korsakoff, withdrawal, extensive ED work up requiring lab/imaging review and 2 consults and admission)   Critical care was time spent personally by me on the following activities:  Discussions with consultants, evaluation of patient's response to treatment, examination of patient, ordering and performing treatments and interventions, ordering and review of laboratory studies,  ordering and review of radiographic studies, pulse oximetry, re-evaluation of patient's condition, obtaining history from patient or surrogate and review of old charts   I assumed direction of critical care for this patient from another provider in my specialty: no      MDM   0830: Evaluated pt. He feels "good". CBC with stable hgb. Lactic 3.2 from 3.0 despite 3 L IVF. Persistent tachycardia now for 16+ hours in ED. Hypertension improved.  I ambulated pt. He cannot stand up or take one step without walker, with walkinghe has moderate leg/arm tremors causing unsteady gait even even with walker. Ataxia with finger to  nose (poked himself in the eye).  While sitting he has no diaphoresis, tremors, n/v, hallucinations and does not clinically appear to have classic WD symptoms. He does not feel like he's withdrawing.   Reports 2 weeks ago he woke up one morning and had difficulty walking. Denies SI, HI AVH. Concern for withdrawal vs WE/Korsakoff.  Will repeat CIWA, give ativan, reassess.   [id number]: Spoke to Dr Amada JupiterKirkpatrick recommends drawing thiamine level, MRI.  Thiamine given last night and level likely unreliable today. Will admit for persistent tachycardia. He will benefit from formal PT evaluation, neurology evaluation.   [id number]: Spoke to Dr Caleb PoppNettey who has accepted pt. Neurology will formally see patient. MRI ordered. Pt updated.     Liberty HandyGibbons, Angelgabriel Willmore J, PA-C 11/20/18 1114    Loren RacerYelverton, David, MD 11/21/18 1620

## 2018-11-21 ENCOUNTER — Inpatient Hospital Stay (HOSPITAL_COMMUNITY): Payer: Self-pay

## 2018-11-21 LAB — BASIC METABOLIC PANEL
Anion gap: 9 (ref 5–15)
BUN: <5 mg/dL — ABNORMAL LOW (ref 6–20)
CO2: 23 mmol/L (ref 22–32)
Calcium: 8.9 mg/dL (ref 8.9–10.3)
Chloride: 107 mmol/L (ref 98–111)
Creatinine, Ser: 0.69 mg/dL (ref 0.61–1.24)
GFR calc Af Amer: >60 mL/min (ref >60)
GFR calc non Af Amer: >60 mL/min (ref >60)
Glucose, Bld: 100 mg/dL — ABNORMAL HIGH (ref 70–99)
Potassium: 3.6 mmol/L (ref 3.5–5.1)
Sodium: 139 mmol/L (ref 135–145)

## 2018-11-21 LAB — CBC
HCT: 37.9 % — ABNORMAL LOW (ref 39.0–52.0)
Hemoglobin: 12.8 g/dL — ABNORMAL LOW (ref 13.0–17.0)
MCH: 35.1 pg — ABNORMAL HIGH (ref 26.0–34.0)
MCHC: 33.8 g/dL (ref 30.0–36.0)
MCV: 103.8 fL — ABNORMAL HIGH (ref 80.0–100.0)
Platelets: 345 10*3/uL (ref 150–400)
RBC: 3.65 MIL/uL — ABNORMAL LOW (ref 4.22–5.81)
RDW: 13.9 % (ref 11.5–15.5)
WBC: 9.7 10*3/uL (ref 4.0–10.5)
nRBC: 0 % (ref 0.0–0.2)

## 2018-11-21 LAB — PROTIME-INR
INR: 1.1 (ref 0.8–1.2)
Prothrombin Time: 13.9 seconds (ref 11.4–15.2)

## 2018-11-21 LAB — GLUCOSE, CAPILLARY: Glucose-Capillary: 92 mg/dL (ref 70–99)

## 2018-11-21 LAB — APTT: aPTT: 28 seconds (ref 24–36)

## 2018-11-21 MED ORDER — HALOPERIDOL 2 MG PO TABS
2.0000 mg | ORAL_TABLET | Freq: Four times a day (QID) | ORAL | Status: DC | PRN
  Administered 2018-11-21 – 2018-11-23 (×3): 2 mg via ORAL
  Filled 2018-11-21 (×5): qty 1

## 2018-11-21 MED ORDER — MAGNESIUM OXIDE 400 (241.3 MG) MG PO TABS
400.0000 mg | ORAL_TABLET | Freq: Two times a day (BID) | ORAL | Status: DC
  Administered 2018-11-21 – 2018-12-07 (×32): 400 mg via ORAL
  Filled 2018-11-21 (×32): qty 1

## 2018-11-21 MED ORDER — LORAZEPAM 2 MG/ML IJ SOLN
0.0000 mg | Freq: Four times a day (QID) | INTRAMUSCULAR | Status: DC
  Administered 2018-11-21 – 2018-11-22 (×3): 2 mg via INTRAVENOUS
  Filled 2018-11-21 (×4): qty 1

## 2018-11-21 MED ORDER — THIAMINE HCL 100 MG/ML IJ SOLN
500.0000 mg | INTRAVENOUS | Status: AC
  Administered 2018-11-21 – 2018-11-22 (×2): 500 mg via INTRAVENOUS
  Filled 2018-11-21 (×2): qty 5

## 2018-11-21 MED ORDER — LORAZEPAM 1 MG PO TABS
2.0000 mg | ORAL_TABLET | Freq: Once | ORAL | Status: AC
  Administered 2018-11-21: 2 mg via ORAL
  Filled 2018-11-21: qty 2

## 2018-11-21 MED ORDER — PRO-STAT SUGAR FREE PO LIQD
30.0000 mL | Freq: Two times a day (BID) | ORAL | Status: DC
  Administered 2018-11-21 – 2018-12-07 (×28): 30 mL via ORAL
  Filled 2018-11-21 (×28): qty 30

## 2018-11-21 MED ORDER — METOPROLOL TARTRATE 5 MG/5ML IV SOLN
5.0000 mg | Freq: Once | INTRAVENOUS | Status: AC
  Administered 2018-11-21: 5 mg via INTRAVENOUS
  Filled 2018-11-21: qty 5

## 2018-11-21 MED ORDER — LABETALOL HCL 5 MG/ML IV SOLN
10.0000 mg | INTRAVENOUS | Status: DC | PRN
  Administered 2018-11-21 – 2018-12-03 (×6): 10 mg via INTRAVENOUS
  Filled 2018-11-21 (×8): qty 4

## 2018-11-21 MED ORDER — LORAZEPAM 2 MG/ML IJ SOLN: 0.0000 mg | Freq: Two times a day (BID) | INTRAMUSCULAR | Status: DC

## 2018-11-21 MED ORDER — HALOPERIDOL LACTATE 5 MG/ML IJ SOLN: 2.0000 mg | Freq: Four times a day (QID) | INTRAMUSCULAR | Status: DC | PRN

## 2018-11-21 MED ORDER — ENSURE ENLIVE PO LIQD
237.0000 mL | Freq: Two times a day (BID) | ORAL | Status: DC
  Administered 2018-11-21 – 2018-11-24 (×6): 237 mL via ORAL

## 2018-11-21 NOTE — Progress Notes (Signed)
Patient's Vitals were: 99.5F; HR118; RR20; 180/122 (MAP 134); 99% R.A. Patient was given an antihypertensive.  PCP was notified and RN will continue to monitor the patient.

## 2018-11-21 NOTE — Progress Notes (Signed)
PROGRESS NOTE    Brett Molina  TWS:[id number] DOB: 20-Sep-1990 DOA: 11/19/2018 PCP: Patient, No Pcp Per    Brief Narrative:  28 year old male with alcoholism and currently with heavy binge drinking, history of hypertension who presented to the emergency room with intoxication and unable to ambulate without assistance.  In the ER he was intoxicated with alcohol level of 411.  He was also found to be severely dehydrated.  Treated in the emergency room and found to be ataxic to ambulate and with alcohol withdrawal so admitted to the hospital.   Assessment & Plan:   Principal Problem:   Ataxia Active Problems:   Alcoholic intoxication without complication (HCC)   Alcoholic hepatitis without ascites   Alcohol abuse   Gastroesophageal reflux disease with esophagitis   HTN (hypertension)  Alcohol intoxication with alcohol withdrawal, alcoholism with ataxia: No focal neurological deficit.  Currently with active alcohol withdrawal. Continue on CIWA scale with adequate benzodiazepines.  All-time fall precautions.  Delirium precautions. Was given high-dose thiamine x1, will provide thiamine 500 mg IV for total 3 days.  Continue oral thiamine and multivitamins.  Appreciate neurology input.  We will try to get MRI of the brain and C-spine today given significant ataxia as ordered by neurology. Extensive counseling done.  Resources given. She he work with PT OT.  Recommended physical therapy and inpatient rehab, will refer to rehab. He will continue to work with therapies when he is in the hospital.  Hypertension: Blood pressures are uncontrolled.  Will resume all home medications.  Will keep patient on as needed IV and oral antihypertensives to control blood pressures to keep systolic blood pressure less than 180.  Will need further up titration up blood pressure medications.  Treat with benzodiazepine for alcohol withdrawal.  GERD: On Pepcid twice daily.  Continue.   DVT prophylaxis:  SCDs Code Status: Full code Family Communication: Patient himself Disposition Plan: Inpatient   Consultants:   Rehab  Procedures:   None  Antimicrobials:   None   Subjective: Patient was seen and examined.  Is still tremulous and shaky.  Denies any chest pain shortness of breath.  No other overnight events.  Remains tachycardic and hypertensive.  Objective: Vitals:   11/21/18 0629 11/21/18 0856 11/21/18 1007 11/21/18 1036  BP: (!) 160/112 (!) 166/119 (!) 181/101 (!) 151/135  Pulse: (!) 118 (!) 125 (!) 134 (!) 138  Resp: 20  16   Temp: 99.5 F (37.5 C) 99.4 F (37.4 C) 99.1 F (37.3 C)   TempSrc: Oral Oral Oral   SpO2: 99% 100%  96%  Weight:      Height:        Intake/Output Summary (Last 24 hours) at 11/21/2018 1039 Last data filed at 11/21/2018 0912 Gross per 24 hour  Intake 957 ml  Output 1850 ml  Net -893 ml   Filed Weights   11/20/18 1136 11/21/18 0516  Weight: 97.3 kg 97.6 kg    Examination:  General exam: Shaky, tremors and blushed. Respiratory system: Clear to auscultation. Respiratory effort normal. Cardiovascular system: S1 & S2 heard, RRR.  Tachycardic.  No JVD, murmurs, rubs, gallops or clicks. No pedal edema. Gastrointestinal system: Abdomen is nondistended, soft and nontender. No organomegaly or masses felt. Normal bowel sounds heard. Central nervous system: Alert and oriented. No focal neurological deficits. Extremities: Symmetric 5 x 5 power. Skin: No rashes, lesions or ulcers Psychiatry: Judgement and insight appear normal. Mood & affect anxious and flat.    Data Reviewed: I have  personally reviewed following labs and imaging studies  CBC: Recent Labs  Lab 11/20/18 0621 11/21/18 0420  WBC 13.9* 9.7  NEUTROABS 11.7*  --   HGB 12.5* 12.8*  HCT 37.1* 37.9*  MCV 103.9* 103.8*  PLT 366 345   Basic Metabolic Panel: Recent Labs  Lab 11/19/18 1745 11/20/18 0714 11/21/18 0420  NA 143  --  139  K 3.5  --  3.6  CL 107  --  107   CO2 21*  --  23  GLUCOSE 99  --  100*  BUN <5*  --  <5*  CREATININE 0.67 0.64 0.69  CALCIUM 9.2  --  8.9  MG  --  1.6*  --   PHOS  --  3.2  --    GFR: Estimated Creatinine Clearance: 157 mL/min (by C-G formula based on SCr of 0.69 mg/dL). Liver Function Tests: Recent Labs  Lab 11/19/18 1745  AST 31  ALT 39  ALKPHOS 67  BILITOT 0.3  PROT 7.9  ALBUMIN 4.3   Recent Labs  Lab 11/19/18 1745  LIPASE 29   No results for input(s): AMMONIA in the last 168 hours. Coagulation Profile: Recent Labs  Lab 11/19/18 1745 11/21/18 0420  INR 1.0 1.1   Cardiac Enzymes: Recent Labs  Lab 11/19/18 1744 11/20/18 0714  CKTOTAL 154 143   BNP (last 3 results) No results for input(s): PROBNP in the last 8760 hours. HbA1C: Recent Labs    11/20/18 1632  HGBA1C 5.7*   CBG: Recent Labs  Lab 11/19/18 2141 11/21/18 0741  GLUCAP 81 92   Lipid Profile: No results for input(s): CHOL, HDL, LDLCALC, TRIG, CHOLHDL, LDLDIRECT in the last 72 hours. Thyroid Function Tests: Recent Labs    11/20/18 1632  TSH 4.439   Anemia Panel: No results for input(s): VITAMINB12, FOLATE, FERRITIN, TIBC, IRON, RETICCTPCT in the last 72 hours. Sepsis Labs: Recent Labs  Lab 11/19/18 2123 11/19/18 2323 11/20/18 0550 11/20/18 1632  LATICACIDVEN 3.2* 3.0* 3.2* 1.7    Recent Results (from the past 240 hour(s))  SARS Coronavirus 2 (CEPHEID - Performed in [facility] hospital lab), Hosp Order     Status: None   Collection Time: 11/19/18  5:45 PM  Result Value Ref Range Status   SARS Coronavirus 2 NEGATIVE NEGATIVE Final    Comment: (NOTE) If result is NEGATIVE SARS-CoV-2 target nucleic acids are NOT DETECTED. The SARS-CoV-2 RNA is generally detectable in upper and lower  respiratory specimens during the acute phase of infection. The lowest  concentration of SARS-CoV-2 viral copies this assay can detect is 250  copies / mL. A negative result does not preclude SARS-CoV-2 infection  and should not  be used as the sole basis for treatment or other  patient management decisions.  A negative result may occur with  improper specimen collection / handling, submission of specimen other  than nasopharyngeal swab, presence of viral mutation(s) within the  areas targeted by this assay, and inadequate number of viral copies  (<250 copies / mL). A negative result must be combined with clinical  observations, patient history, and epidemiological information. If result is POSITIVE SARS-CoV-2 target nucleic acids are DETECTED. The SARS-CoV-2 RNA is generally detectable in upper and lower  respiratory specimens dur ing the acute phase of infection.  Positive  results are indicative of active infection with SARS-CoV-2.  Clinical  correlation with patient history and other diagnostic information is  necessary to determine patient infection status.  Positive results do  not rule out  bacterial infection or co-infection with other viruses. If result is PRESUMPTIVE POSTIVE SARS-CoV-2 nucleic acids MAY BE PRESENT.   A presumptive positive result was obtained on the submitted specimen  and confirmed on repeat testing.  While 2019 novel coronavirus  (SARS-CoV-2) nucleic acids may be present in the submitted sample  additional confirmatory testing may be necessary for epidemiological  and / or clinical management purposes  to differentiate between  SARS-CoV-2 and other Sarbecovirus currently known to infect humans.  If clinically indicated additional testing with an alternate test  methodology [phone]) is advised. The SARS-CoV-2 RNA is generally  detectable in upper and lower respiratory sp ecimens during the acute  phase of infection. The expected result is Negative. Fact Sheet for Patients:  BoilerBrush.com.cy Fact Sheet for Healthcare Providers: https://pope.com/ This test is not yet approved or cleared by the [place] FDA and has been authorized  for detection and/or diagnosis of SARS-CoV-2 by FDA under an Emergency Use Authorization (EUA).  This EUA will remain in effect (meaning this test can be used) for the duration of the COVID-19 declaration under Section 564(b)(1) of the Act, 21 U.S.C. section 360bbb-3(b)(1), unless the authorization is terminated or revoked sooner. Performed at [facility], [address], [place], [place] [address]          Radiology Studies: Ct Head Wo Contrast  Result Date: 11/19/2018 CLINICAL DATA:  Altered level of consciousness. EXAM: CT HEAD WITHOUT CONTRAST TECHNIQUE: Contiguous axial images were obtained from the base of the skull through the vertex without intravenous contrast. COMPARISON:  01/23/2017 FINDINGS: Brain: No evidence of acute infarction, hemorrhage, hydrocephalus, extra-axial collection or mass lesion/mass effect. Vascular: No hyperdense vessel or unexpected calcification. Skull: Normal. Negative for fracture or focal lesion. Sinuses/Orbits: No acute finding. Other: None IMPRESSION: 1. No acute intracranial abnormalities.  Normal brain. Electronically Signed   By: Signa Kell M.D.   On: 11/19/2018 18:58   Dg Chest Port 1 View  Result Date: 11/19/2018 CLINICAL DATA:  ETOH, weakness EXAM: PORTABLE CHEST 1 VIEW COMPARISON:  12/14/2016 FINDINGS: Cardiomegaly. Both lungs are clear. The visualized skeletal structures are unremarkable. IMPRESSION: Cardiomegaly without acute abnormality of the lungs in AP portable projection. Electronically Signed   By: Lauralyn Primes M.D.   On: 11/19/2018 18:23        Scheduled Meds: . docusate sodium  100 mg Oral BID  . famotidine  20 mg Oral BID  . feeding supplement (ENSURE ENLIVE)  237 mL Oral BID BM  . feeding supplement (PRO-STAT SUGAR FREE 64)  30 mL Oral BID  . folic acid  1 mg Oral Daily  . heparin  5,000 Units Subcutaneous Q8H  . hydrALAZINE  50 mg Oral TID  . labetalol  300 mg Oral BID  . LORazepam  0-4 mg  Intravenous Q6H   Or  . LORazepam  0-4 mg Oral Q6H  . [START ON 11/22/2018] LORazepam  0-4 mg Intravenous Q12H   Or  . [START ON 11/22/2018] LORazepam  0-4 mg Oral Q12H  . magnesium oxide  400 mg Oral BID  . multivitamin with minerals  1 tablet Oral Daily  . sodium chloride flush  3 mL Intravenous Q12H  . thiamine  100 mg Oral Daily   Continuous Infusions: . thiamine injection       LOS: 1 day    Time spent: 25 minutes    Dorcas Carrow, MD [facility] Pager [phone]  If 7PM-7AM, please contact night-coverage [email or web address] Password [facility] 11/21/2018, 10:39  AM  

## 2018-11-21 NOTE — Progress Notes (Signed)
Initial Nutrition Assessment  RD working remotely.   DOCUMENTATION CODES:   Not applicable  INTERVENTION:  - continue Ensure Enlive BID, each supplement provides 350 kcal and 20 grams of protein. - will order 30 mL Prostat BID, each supplement provides 100 kcal and 15 grams of protein. - continue to encourage PO intakes.    NUTRITION DIAGNOSIS:   Inadequate oral intake related to social / environmental circumstances(alcohol abuse) as evidenced by per patient/family report.  GOAL:   Patient will meet greater than or equal to 90% of their needs  MONITOR:   PO intake, Supplement acceptance, Labs, Weight trends  REASON FOR ASSESSMENT:   Malnutrition Screening Tool  ASSESSMENT:   28 y.o. male with medical history significant of HTN, GERD, chronic alcohol abuse, history of upper GIB, and depression. He presented to the ED on 5/16 in alcohol intoxication state. He was kept in the ED and treated with CIWA protocol, IV fluid hydration. Patient reported that he had not been able to ambulate in the past 2-3 weeks, has borrowed a walker to assist him in ambulation. Admits that his drinking habit history started at age 53, and that in the past 2 months  he has been drinking heavily, typically drinking 1.5L of wine/day and did so about an hour before presentation to the ED on 5/16. Patient reports heavy drinking d/t being laid off from his job due to the pandemic.  Patient on Regular diet with no intakes documented since admission. Patient was seen by this RD in January. Patient reports that over the past 2 months he has been drinking heavily (notes indicate 1.5L of wine/day) d/t depression over being laid off. During this time frame he has not been focusing on food as heavily and prioritizes alcohol.   Patient denies any abdominal pain, N/V since admission or with heavy alcohol consumption the past few months. Per chart review, current weight is 215 lb and weight on 2/8 was 224 lb. This  indicates 9 lb weight loss (4% body weight) in the past 3 months; not significant for time frame.   Neurology is following patient. No findings concerning for Wernicke's encephalopathy, plan for ECHO, plan for MRI brain and cervical spine.    Medications reviewed; 100 mg colace BID, 20 mg oral pepcid BID, 1 mg oral folvite/day, daily multivitamin with minerals, 100 mg oral thiamine/day.  Labs reviewed; BUN: <5 mg/dl.      NUTRITION - FOCUSED PHYSICAL EXAM:  unable to complete at this time.   Diet Order:   Diet Order            Diet regular Room service appropriate? Yes; Fluid consistency: Thin  Diet effective now              EDUCATION NEEDS:   Not appropriate for education at this time  Skin:  Skin Assessment: Reviewed RN Assessment  Last BM:  5/16  Height:   Ht Readings from Last 1 Encounters:  11/20/18 6\' 2"  (1.88 m)    Weight:   Wt Readings from Last 1 Encounters:  11/21/18 97.6 kg    Ideal Body Weight:  86.4 kg  BMI:  Body mass index is 27.62 kg/m.  Estimated Nutritional Needs:   Kcal:  8264-1583 kcal  Protein:  120-130 grams  Fluid:  >/= 2.2 L/day     Trenton Gammon, MS, RD, LDN, [facility] Inpatient Clinical Dietitian Pager # [phone] After hours/weekend pager # [phone]

## 2018-11-21 NOTE — Progress Notes (Signed)
Patient complaining that his left leg feels "heavier" than the right leg. Neuro assessment completed and weakness in legs has been an issue since admission as patient fell prior to coming in to hospital. Patient is able to lift leg but states it feels like a "strain." No numbness or tingling felt by patient. Dr. Jerral Ralph made aware. Will continue with plan of care.

## 2018-11-21 NOTE — Evaluation (Signed)
Physical Therapy Evaluation Patient Details Name: Brett Molina MRN: [id number] DOB: [birth year] Today's Date: 11/21/2018   History of Present Illness  Brett Molina is a 28 y.o. male with PHMx: th medical history significant of   hypertension, GERD, chronic alcohol abuse, history of upper GI, and depression presented to the ED on 5/16 in  alcohol intoxication state.  Clinical Impression   Pt presents with ataxic gait, difficulty performing mobility tasks, tachycardia up to 130s with mobility, and decreased activity tolerance at this time. Pt to benefit from acute PT to address deficits. Pt ambulated short hallway distance and presented with significant unsteadiness, requiring min assist to correct. Pt requires multiple verbal cues for posture and safe use of RW at this time, limited by postural instability with dynamic activity. Pt states his baseline is independent, working full-time and his gait changes developed ~3 weeks ago. PT recommending CIR for intensive rehabilitation to return pt to PLOF. PT to progress mobility as tolerated, and will continue to follow acutely.      Follow Up Recommendations CIR;Supervision/Assistance - 24 hour    Equipment Recommendations  Other (comment)(defer to next venue)    Recommendations for Other Services       Precautions / Restrictions Precautions Precautions: Fall Precaution Comments: monitor HR and BP Restrictions Weight Bearing Restrictions: No      Mobility  Bed Mobility Overal bed mobility: Needs Assistance Bed Mobility: Supine to Sit     Supine to sit: Supervision;HOB elevated     General bed mobility comments: supervision for safety, use of bedrails to come to sitting  Transfers Overall transfer level: Needs assistance Equipment used: Rolling walker (2 wheeled) Transfers: Sit to/from Stand Sit to Stand: Min assist;From elevated surface         General transfer comment: Min assist for power up, steadying, slow  lowering to sit due to lack of eccentric control. Pt with heavy posterior leaning upon standing, pt noticed and corrected. Verbal cuing for pushing up from bed with UEs vs holding onto RW for support.   Ambulation/Gait Ambulation/Gait assistance: Min assist;+2 safety/equipment(chair follow) Gait Distance (Feet): 60 Feet Assistive device: Rolling walker (2 wheeled) Gait Pattern/deviations: Step-through pattern;Ataxic;Leaning posteriorly;Staggering right;Staggering left Gait velocity: decr   General Gait Details: Min assist for steadying, at times navigating RW for pt due to unsteadiness. Pt with very unsteady ataxic gait with varying BOS and distance from RW. Verbal cuing for upright posture, sequencing, placement in RW.   Stairs            Wheelchair Mobility    Modified Rankin (Stroke Patients Only)       Balance Overall balance assessment: Needs assistance Sitting-balance support: No upper extremity supported;Feet supported Sitting balance-Leahy Scale: Fair Sitting balance - Comments: able to sit EOB unassisted, posterior leaning with LE movement Postural control: Posterior lean Standing balance support: Bilateral upper extremity supported;During functional activity Standing balance-Leahy Scale: Poor Standing balance comment: reliant on UE support and PT for steadying                             Pertinent Vitals/Pain Pain Assessment: No/denies pain    Home Living Family/patient expects to be discharged to:: Private residence Living Arrangements: Other (Comment)(girl friend--works for [employer] in [employer]) Available Help at Discharge: Other (Comment)(girlfriend) Type of Home: House Home Access: Stairs to enter Entrance Stairs-Rails: Right(usually goes up them sideways with both hands on rail) Entrance Stairs-Number of Steps: 2 Home  Layout: One level Home Equipment: Walker - 2 wheels      Prior Function Level of Independence: Independent with  assistive device(s)         Comments: Independent as of two months ago, working fulltime (was laid off due to [employer]), drives. Pt reports using RW for the past 2 weeks, reports 1 fall in this time period.      Hand Dominance   Dominant Hand: Right    Extremity/Trunk Assessment   Upper Extremity Assessment Upper Extremity Assessment: LUE deficits/detail;RUE deficits/detail RUE Deficits / Details: Per OT note: tremors/ataxic but able to function; pt reports that if his mind is on something else and he is relaxed the tremors are not as bad; pt kept getting RUE caught in wires of telemetry box while trying to get his tray set up to eat (cues needed to make him realize this was happening x2) LUE Deficits / Details: see above    Lower Extremity Assessment Lower Extremity Assessment: RLE deficits/detail;LLE deficits/detail RLE Deficits / Details: + heel-to-shin incoordination; at least 3/5 quad and hip flexion strength sitting EOB but unable to control amplitude of movement RLE Coordination: decreased gross motor LLE Deficits / Details: + heel-to-shin incoordination; at least 3/5 quad and hip flexion strength sitting EOB but unable to control amplitude of movement LLE Coordination: decreased gross motor    Cervical / Trunk Assessment Cervical / Trunk Assessment: Other exceptions Cervical / Trunk Exceptions: postural instability   Communication   Communication: No difficulties  Cognition Arousal/Alertness: Awake/alert Behavior During Therapy: WFL for tasks assessed/performed Overall Cognitive Status: Impaired/Different from baseline Area of Impairment: Safety/judgement;Problem solving;Memory;Following commands                     Memory: Decreased short-term memory Following Commands: Follows multi-step commands consistently Safety/Judgement: Decreased awareness of safety   Problem Solving: Requires verbal cues General Comments: required reinforcements for safety with RW,  and when reminded pt states "OT told me that this morning, I forgot"      General Comments General comments (skin integrity, edema, etc.): HR up to 133 bpm with ambulation    Exercises     Assessment/Plan    PT Assessment Patient needs continued PT services  PT Problem List Decreased strength;Decreased activity tolerance;Decreased mobility;Decreased safety awareness;Decreased balance;Decreased coordination;Decreased knowledge of use of DME       PT Treatment Interventions DME instruction;Stair training;Therapeutic activities;Balance training;Gait training;Functional mobility training;Therapeutic exercise;Neuromuscular re-education;Patient/family education    PT Goals (Current goals can be found in the Care Plan section)  Acute Rehab PT Goals Patient Stated Goal: walk like before PT Goal Formulation: With patient Time For Goal Achievement: 12/05/18 Potential to Achieve Goals: Fair    Frequency Min 4X/week   Barriers to discharge        Co-evaluation               AM-PAC PT "6 Clicks" Mobility  Outcome Measure Help needed turning from your back to your side while in a flat bed without using bedrails?: A Little Help needed moving from lying on your back to sitting on the side of a flat bed without using bedrails?: A Little Help needed moving to and from a bed to a chair (including a wheelchair)?: A Lot Help needed standing up from a chair using your arms (e.g., wheelchair or bedside chair)?: A Little Help needed to walk in hospital room?: A Little Help needed climbing 3-5 steps with a railing? : A Lot 6 Click Score:  16    End of Session Equipment Utilized During Treatment: Gait belt Activity Tolerance: Patient limited by fatigue Patient left: in chair;with call bell/phone within reach;with chair alarm set Nurse Communication: Mobility status PT Visit Diagnosis: Ataxic gait (R26.0);Other abnormalities of gait and mobility (R26.89)    Time: 5681-2751 PT Time  Calculation (min) (ACUTE ONLY): 13 min   Charges:   PT Evaluation $PT Eval Low Complexity: 1 Low          Maebel Marasco Terrial Rhodes, PT Acute Rehabilitation Services Pager [phone]  Office [phone]   Titania Gault D Despina Hidden 11/21/2018, 4:31 PM

## 2018-11-21 NOTE — Plan of Care (Signed)
  Problem: Education: Goal: Knowledge of General Education information will improve Description Including pain rating scale, medication(s)/side effects and non-pharmacologic comfort measures Outcome: Progressing   Problem: Health Behavior/Discharge Planning: Goal: Ability to manage health-related needs will improve Outcome: Progressing   Problem: Clinical Measurements: Goal: Ability to maintain clinical measurements within normal limits will improve Outcome: Progressing Goal: Will remain free from infection Outcome: Progressing Goal: Diagnostic test results will improve Outcome: Progressing Goal: Respiratory complications will improve Outcome: Progressing Goal: Cardiovascular complication will be avoided Outcome: Progressing   Problem: Activity: Goal: Risk for activity intolerance will decrease Outcome: Progressing   Problem: Nutrition: Goal: Adequate nutrition will be maintained Outcome: Progressing   Problem: Elimination: Goal: Will not experience complications related to urinary retention Outcome: Progressing   Problem: Pain Managment: Goal: General experience of comfort will improve Outcome: Progressing   Problem: Skin Integrity: Goal: Risk for impaired skin integrity will decrease Outcome: Progressing

## 2018-11-21 NOTE — Progress Notes (Signed)
Inpatient Rehabilitation Admissions Coordinator   Inpatient Rehab Consult received. Alcohol intoxication with withdrawal is not a diagnosis amenable to an inpt acute rehab admission . I contacted RN CM, Olegario Messier, and she is aware. We will sign off at this time.  Ottie Glazier, RN, MSN Rehab Admissions Coordinator [phone] 11/21/2018 12:35 PM

## 2018-11-21 NOTE — Evaluation (Signed)
Occupational Therapy Evaluation Patient Details Name: Brett Molina MRN: [id number] DOB: [birth year] Today's Date: 11/21/2018    History of Present Illness Brett Molina is a 28 y.o. male with PMHx: HTN, GERD, chronic alcohol abuse, history of upper GI, and depression presented to the ED on 5/16 in alcohol intoxication state and inability to ambulate without AD for 2-3 weeks.; also with tachycardia   Clinical Impression   This 28 yo male admitted with above presents to acute OT with decreased balance when up on feet with heavy reliance on RW, decreased strength in legs for sit<>stand leading increased heavy reliance on RW, tremors/ataxic UEs and LEs. All of this affecting his PLOF up until about a month ago being at an independent level and working full time up until 2 months ago (got laid off due to COVID). Pt's HR went from 100's up to as high as 140's with ambulation. He will continue to benefit from acute OT with follow up OT on CIR.    Follow Up Recommendations  CIR;Supervision/Assistance - 24 hour    Equipment Recommendations  Tub/shower bench       Precautions / Restrictions Precautions Precautions: Fall Precaution Comments: monitor HR and BP Restrictions Weight Bearing Restrictions: No      Mobility Bed Mobility Overal bed mobility: Needs Assistance Bed Mobility: Supine to Sit;Sit to Supine     Supine to sit: Supervision;HOB elevated Sit to supine: Supervision;HOB elevated      Transfers Overall transfer level: Needs assistance Equipment used: Rolling walker (2 wheeled) Transfers: Sit to/from Stand Sit to Stand: Min assist;Mod assist         General transfer comment: Min A sit>stand with cues to have only one hand onl RW not both (he has been doing this at home) and Mod A stand>sit due to as he goes to sit at ~2/3 way down he just plops (no control); ambulated 10 feet x2 with heavy reliance on RW    Balance Overall balance assessment: Needs  assistance Sitting-balance support: No upper extremity supported;Feet supported Sitting balance-Leahy Scale: Fair     Standing balance support: Bilateral upper extremity supported;During functional activity Standing balance-Leahy Scale: Poor                             ADL either performed or assessed with clinical judgement   ADL Overall ADL's : Needs assistance/impaired Eating/Feeding: Modified independent;Sitting Eating/Feeding Details (indicate cue type and reason): increased time due to tremors/ataxic Grooming: Supervision/safety;Set up;Sitting Grooming Details (indicate cue type and reason): increased time due to tremors/ataxic Upper Body Bathing: Supervision/ safety;Set up;Sitting Upper Body Bathing Details (indicate cue type and reason): increased time due to tremors/ataxic Lower Body Bathing: Moderate assistance Lower Body Bathing Details (indicate cue type and reason): Min A sit>stand, Mod A stand>sit (due to decreased leg control as he sits), Mod A to maintain balance when hand is off walker with dynamic task; increased time due to tremors/ataxic Upper Body Dressing : Supervision/safety;Set up Upper Body Dressing Details (indicate cue type and reason): increased time due to tremors/ataxic Lower Body Dressing: Moderate assistance Lower Body Dressing Details (indicate cue type and reason): Min A sit>stand, Mod A stand>sit (due to decreased leg control as he sits), Mod A to maintain balance when hand is off walker with dynamic task; increased time due to tremors/ataxic Toilet Transfer: Moderate assistance   Toileting- Clothing Manipulation and Hygiene: Moderate assistance Toileting - Clothing Manipulation Details (indicate cue type and  reason): Min A sit>stand, Mod A stand>sit (due to decreased leg control as he sits), Mod A to maintain balance when hand is off walker with dynamic task; increased time due to tremors/ataxic             Vision Patient Visual  Report: No change from baseline              Pertinent Vitals/Pain Pain Assessment: No/denies pain     Hand Dominance Right   Extremity/Trunk Assessment Upper Extremity Assessment Upper Extremity Assessment: Generalized weakness;RUE deficits/detail;LUE deficits/detail RUE Deficits / Details: tremors/ataxic but able to function; pt reports that if his mind is on something else and he is relaxed the tremors are not as bad; pt kept getting RUE caught in wires of telemetry box while trying to get his tray set up to eat (cues needed to make him realize this was happening x2) LUE Deficits / Details: tremors/ataxic but able to function; pt reports that if his mind is on something else and he is relaxed the tremors are not as bad   Lower Extremity Assessment Lower Extremity Assessment: Defer to PT evaluation       Communication Communication Communication: No difficulties   Cognition Arousal/Alertness: Awake/alert Behavior During Therapy: WFL for tasks assessed/performed Overall Cognitive Status: Impaired/Different from baseline Area of Impairment: Safety/judgement;Problem solving                         Safety/Judgement: Decreased awareness of safety   Problem Solving: Requires verbal cues General Comments: kept getting wires of telemetry box caught in fingers of right hand, needed VCs to fix.              Home Living Family/patient expects to be discharged to:: Private residence Living Arrangements: Other (Comment)(girl friend--works for [employer] in [employer]) Available Help at Discharge: Other (Comment)(girlfriend) Type of Home: House Home Access: Stairs to enter [employer] of Steps: 2 Entrance Stairs-Rails: Right(usually goes up them sideways with both hands on rail) Home Layout: One level     Bathroom Shower/Tub: Tub/shower unit;Curtain   Bathroom Toilet: Handicapped height     Home Equipment: Environmental consultant - 2 wheels   Additional Comments: Was  working up until 2 months ago (got layed off due to COVID)      Prior Functioning/Environment Level of Independence: Independent        Comments: Independent as of two months ago, working fulltime (was laid off due to COVID), drives, reports he was getting down into tub to bathe        OT Problem List: Decreased strength;Impaired balance (sitting and/or standing);Decreased safety awareness;Impaired UE functional use      OT Treatment/Interventions: Self-care/ADL training;Balance training;DME and/or AE instruction;Patient/family education    OT Goals(Current goals can be found in the care plan section) Acute Rehab OT Goals Patient Stated Goal: to get to where I can walk again without walker and get back to work OT Goal Formulation: With patient Time For Goal Achievement: 11/21/18 Potential to Achieve Goals: Good  OT Frequency: Min 2X/week              AM-PAC OT "6 Clicks" Daily Activity     Outcome Measure Help from another person eating meals?: None Help from another person taking care of personal grooming?: A Little Help from another person toileting, which includes using toliet, bedpan, or urinal?: A Lot Help from another person bathing (including washing, rinsing, drying)?: A Lot Help from another person to  put on and taking off regular upper body clothing?: A Little Help from another person to put on and taking off regular lower body clothing?: A Lot 6 Click Score: 16   End of Session Equipment Utilized During Treatment: Gait belt;Rolling walker Nurse Communication: Mobility status  Activity Tolerance: Patient tolerated treatment well Patient left: in bed;with call bell/phone within reach;with bed alarm set  OT Visit Diagnosis: Unsteadiness on feet (R26.81);Other abnormalities of gait and mobility (R26.89);Muscle weakness (generalized) (M62.81);Ataxia, unspecified (R27.0)                Time: [id number] OT Time Calculation (min): 28 min Charges:  OT General  Charges $OT Visit: 1 Visit OT Evaluation $OT Eval Moderate Complexity: 1 Mod OT Treatments $Self Care/Home Management : 8-22 mins  Ignacia Palmaathy Praneeth Bussey, OTR/L [facility] Pager [phone] Office [phone]     Evette GeorgesLeonard, Nesiah Jump Eva 11/21/2018, 9:16 AM

## 2018-11-22 LAB — CBC
HCT: 40.2 % (ref 39.0–52.0)
Hemoglobin: 12.9 g/dL — ABNORMAL LOW (ref 13.0–17.0)
MCH: 33.6 pg (ref 26.0–34.0)
MCHC: 32.1 g/dL (ref 30.0–36.0)
MCV: 104.7 fL — ABNORMAL HIGH (ref 80.0–100.0)
Platelets: 306 10*3/uL (ref 150–400)
RBC: 3.84 MIL/uL — ABNORMAL LOW (ref 4.22–5.81)
RDW: 13.5 % (ref 11.5–15.5)
WBC: 9.7 10*3/uL (ref 4.0–10.5)
nRBC: 0 % (ref 0.0–0.2)

## 2018-11-22 LAB — GLUCOSE, CAPILLARY: Glucose-Capillary: 85 mg/dL (ref 70–99)

## 2018-11-22 LAB — BASIC METABOLIC PANEL
Anion gap: 11 (ref 5–15)
BUN: 7 mg/dL (ref 6–20)
CO2: 25 mmol/L (ref 22–32)
Calcium: 9.3 mg/dL (ref 8.9–10.3)
Chloride: 105 mmol/L (ref 98–111)
Creatinine, Ser: 0.78 mg/dL (ref 0.61–1.24)
GFR calc Af Amer: >60 mL/min (ref >60)
GFR calc non Af Amer: >60 mL/min (ref >60)
Glucose, Bld: 93 mg/dL (ref 70–99)
Potassium: 3.6 mmol/L (ref 3.5–5.1)
Sodium: 141 mmol/L (ref 135–145)

## 2018-11-22 LAB — VITAMIN B1: Vitamin B1 (Thiamine): 126 nmol/L (ref 66.5–200.0)

## 2018-11-22 MED ORDER — DIAZEPAM 5 MG PO TABS
10.0000 mg | ORAL_TABLET | Freq: Four times a day (QID) | ORAL | Status: DC
  Administered 2018-11-22 – 2018-11-24 (×9): 10 mg via ORAL
  Filled 2018-11-22 (×9): qty 2

## 2018-11-22 MED ORDER — LORAZEPAM 2 MG/ML IJ SOLN
1.0000 mg | INTRAMUSCULAR | Status: DC | PRN
  Administered 2018-11-22 – 2018-11-23 (×4): 1 mg via INTRAVENOUS
  Filled 2018-11-22 (×4): qty 1

## 2018-11-22 NOTE — Progress Notes (Signed)
PROGRESS NOTE    Hermann Salyers Mizrahi  OFB:[id number] DOB: [birth year] DOA: 11/19/2018 PCP: Patient, No Pcp Per    Brief Narrative:  28 year old male with alcoholism and currently with heavy binge drinking, history of hypertension who presented to the emergency room with intoxication and unable to ambulate without assistance.  In the ER he was intoxicated with alcohol level of 411.  He was also found to be severely dehydrated.  Treated in the emergency room and found to be ataxic to ambulate and with alcohol withdrawal so admitted to the hospital.   Assessment & Plan:   Principal Problem:   Ataxia Active Problems:   Alcoholic intoxication without complication (HCC)   Alcoholic hepatitis without ascites   Alcohol abuse   Gastroesophageal reflux disease with esophagitis   HTN (hypertension)  Alcohol intoxication with alcohol withdrawal, alcoholism with ataxia: No focal neurological deficit.  Currently with active alcohol withdrawal. Continue on CIWA scale with adequate benzodiazepines.  Patient is needing a lot of benzodiazepine.  Will put patient on Valium 10 mg every 6 hours scheduled and augment with as needed Ativan.  Also Haldol for additional medication.  Fall precautions. Delirium precautions. Thiamine 500 mg IV once a day for 3 days.   Continue oral thiamine and multivitamins.  Appreciate neurology input.  MRI of the brain and C-spine which was suboptimal, however did not show any significant pathology.  Extensive counseling done.  Resources given. He will continue to work with therapies when he is in the hospital. Patient has been anxious with tremors and uncomfortable with telemetry leads.  Review of telemetry shows mostly sinus tachycardia.  Will treat with benzodiazepines.  Discontinue telemetry.  Hypertension: Blood pressures are uncontrolled.  Resumed all home medications.  Will keep patient on as needed IV and oral antihypertensives to control blood pressures to keep  systolic blood pressure less than 180.  Will need further up titration up blood pressure medications.  Treat with benzodiazepine for alcohol withdrawal.  GERD: On Pepcid twice daily.  Continue.   DVT prophylaxis: SCDs Code Status: Full code Family Communication: Patient himself Disposition Plan: Inpatient   Consultants:   Rehab  Procedures:   None  Antimicrobials:   None   Subjective: Patient was seen and examined.  Overnight events noted.  Remains tachycardic, shaky and tremulous.  No seizures.  Patient himself states that he has been through this before and he is holding up okay so far. Objective: Vitals:   11/22/18 0500 11/22/18 0609 11/22/18 0640 11/22/18 0957  BP:  (!) 179/146 (!) 137/106 (!) 139/114  Pulse:  (!) 129 (!) 122 (!) 127  Resp:  (!) 24  18  Temp:  99.6 F (37.6 C)  98.9 F (37.2 C)  TempSrc:  Oral  Oral  SpO2:  95%  99%  Weight: 95.7 kg     Height:        Intake/Output Summary (Last 24 hours) at 11/22/2018 1255 Last data filed at 11/22/2018 1000 Gross per 24 hour  Intake 533 ml  Output 1725 ml  Net -1192 ml   Filed Weights   11/20/18 1136 11/21/18 0516 11/22/18 0500  Weight: 97.3 kg 97.6 kg 95.7 kg    Examination:  General exam: Shaky, tremors and blushed. Respiratory system: Clear to auscultation. Respiratory effort normal. Cardiovascular system: S1 & S2 heard, RRR.  Tachycardic.  No JVD, murmurs, rubs, gallops or clicks. No pedal edema. Gastrointestinal system: Abdomen is nondistended, soft and nontender. No organomegaly or masses felt. Normal bowel sounds heard.  Central nervous system: Alert and oriented. No focal neurological deficits. Extremities: Symmetric 5 x 5 power. Skin: No rashes, lesions or ulcers Psychiatry: Judgement and insight appear normal. Mood & affect anxious and flat.    Data Reviewed: I have personally reviewed following labs and imaging studies  CBC: Recent Labs  Lab 11/20/18 0621 11/21/18 0420  11/22/18 0720  WBC 13.9* 9.7 9.7  NEUTROABS 11.7*  --   --   HGB 12.5* 12.8* 12.9*  HCT 37.1* 37.9* 40.2  MCV 103.9* 103.8* 104.7*  PLT 366 345 306   Basic Metabolic Panel: Recent Labs  Lab 11/19/18 1745 11/20/18 0714 11/21/18 0420 11/22/18 0720  NA 143  --  139 141  K 3.5  --  3.6 3.6  CL 107  --  107 105  CO2 21*  --  23 25  GLUCOSE 99  --  100* 93  BUN <5*  --  <5* 7  CREATININE 0.67 0.64 0.69 0.78  CALCIUM 9.2  --  8.9 9.3  MG  --  1.6*  --   --   PHOS  --  3.2  --   --    GFR: Estimated Creatinine Clearance: 157 mL/min (by C-G formula based on SCr of 0.78 mg/dL). Liver Function Tests: Recent Labs  Lab 11/19/18 1745  AST 31  ALT 39  ALKPHOS 67  BILITOT 0.3  PROT 7.9  ALBUMIN 4.3   Recent Labs  Lab 11/19/18 1745  LIPASE 29   No results for input(s): AMMONIA in the last 168 hours. Coagulation Profile: Recent Labs  Lab 11/19/18 1745 11/21/18 0420  INR 1.0 1.1   Cardiac Enzymes: Recent Labs  Lab 11/19/18 1744 11/20/18 0714  CKTOTAL 154 143   BNP (last 3 results) No results for input(s): PROBNP in the last 8760 hours. HbA1C: Recent Labs    11/20/18 1632  HGBA1C 5.7*   CBG: Recent Labs  Lab 11/19/18 2141 11/21/18 0741 11/22/18 0727  GLUCAP 81 92 85   Lipid Profile: No results for input(s): CHOL, HDL, LDLCALC, TRIG, CHOLHDL, LDLDIRECT in the last 72 hours. Thyroid Function Tests: Recent Labs    11/20/18 1632  TSH 4.439   Anemia Panel: No results for input(s): VITAMINB12, FOLATE, FERRITIN, TIBC, IRON, RETICCTPCT in the last 72 hours. Sepsis Labs: Recent Labs  Lab 11/19/18 2123 11/19/18 2323 11/20/18 0550 11/20/18 1632  LATICACIDVEN 3.2* 3.0* 3.2* 1.7    Recent Results (from the past 240 hour(s))  SARS Coronavirus 2 (CEPHEID - Performed in [facility] hospital lab), Hosp Order     Status: None   Collection Time: 11/19/18  5:45 PM  Result Value Ref Range Status   SARS Coronavirus 2 NEGATIVE NEGATIVE Final    Comment:  (NOTE) If result is NEGATIVE SARS-CoV-2 target nucleic acids are NOT DETECTED. The SARS-CoV-2 RNA is generally detectable in upper and lower  respiratory specimens during the acute phase of infection. The lowest  concentration of SARS-CoV-2 viral copies this assay can detect is 250  copies / mL. A negative result does not preclude SARS-CoV-2 infection  and should not be used as the sole basis for treatment or other  patient management decisions.  A negative result may occur with  improper specimen collection / handling, submission of specimen other  than nasopharyngeal swab, presence of viral mutation(s) within the  areas targeted by this assay, and inadequate number of viral copies  (<250 copies / mL). A negative result must be combined with clinical  observations, patient history, and  epidemiological information. If result is POSITIVE SARS-CoV-2 target nucleic acids are DETECTED. The SARS-CoV-2 RNA is generally detectable in upper and lower  respiratory specimens dur ing the acute phase of infection.  Positive  results are indicative of active infection with SARS-CoV-2.  Clinical  correlation with patient history and other diagnostic information is  necessary to determine patient infection status.  Positive results do  not rule out bacterial infection or co-infection with other viruses. If result is PRESUMPTIVE POSTIVE SARS-CoV-2 nucleic acids MAY BE PRESENT.   A presumptive positive result was obtained on the submitted specimen  and confirmed on repeat testing.  While 2019 novel coronavirus  (SARS-CoV-2) nucleic acids may be present in the submitted sample  additional confirmatory testing may be necessary for epidemiological  and / or clinical management purposes  to differentiate between  SARS-CoV-2 and other Sarbecovirus currently known to infect humans.  If clinically indicated additional testing with an alternate test  methodology [phone]([id number]) is advised. The SARS-CoV-2 RNA is  generally  detectable in upper and lower respiratory sp ecimens during the acute  phase of infection. The expected result is Negative. Fact Sheet for Patients:  BoilerBrush.com.cyhttps://www.fda.gov/media/136312/download Fact Sheet for Healthcare Providers: https://pope.com/https://www.fda.gov/media/136313/download This test is not yet approved or cleared by the [place] FDA and has been authorized for detection and/or diagnosis of SARS-CoV-2 by FDA under an Emergency Use Authorization (EUA).  This EUA will remain in effect (meaning this test can be used) for the duration of the COVID-19 declaration under Section 564(b)(1) of the Act, 21 U.S.C. section 360bbb-3(b)(1), unless the authorization is terminated or revoked sooner. Performed at [facility], [address] [place], [place] [address]          Radiology Studies: Mr Brain Wo Contrast  Result Date: 11/21/2018 CLINICAL DATA:  Patient with alcoholism and binge drinking presents with intoxication and unable to ambulate. Severely dehydrated. Ataxia. EXAM: MRI HEAD WITHOUT CONTRAST MRI CERVICAL SPINE WITHOUT CONTRAST TECHNIQUE: Multiplanar, multiecho pulse sequences of the brain and surrounding structures, and cervical spine, to include the craniocervical junction and cervicothoracic junction, were obtained without intravenous contrast. COMPARISON:  CT head 11/19/2018 FINDINGS: The patient was uncooperative, and multiple images are motion degraded. In addition, the patient would not allow administration of contrast. The cervical spine images, in particular, suffer from motion degradation. MRI HEAD FINDINGS Brain: Premature for age cerebral atrophy. Premature for age superior vermian cerebellar atrophy. No significant white matter disease. No acute stroke, hemorrhage, mass lesion, hydrocephalus, or extra-axial fluid. No basal ganglia T1 hyperintensity. No characteristic MR findings to suggest Wernicke encephalopathy, such as abnormal signal in the  mamillary bodies, periaqueductal region/IIIrd ventricle or around the thalami, although limited assessment in noncontrast exam. Vascular: Normal flow voids. Skull and upper cervical spine: Normal marrow signal. Sinuses/Orbits: Negative. Other: None. MRI CERVICAL SPINE FINDINGS Alignment: Reversal of the normal cervical lordotic curve could be positional or due to spasm. Vertebrae: No visible fracture, evidence of diskitis, or bone lesion. Cord: Grossly normal signal and morphology. No definite intraspinal mass lesion. Posterior Fossa, vertebral arteries, paraspinal tissues: Grossly unremarkable. Disc levels: No visible disc protrusion or spinal stenosis. IMPRESSION: Motion degraded examination demonstrates no acute or focal intracranial abnormality. Premature for age cerebral atrophy and premature for age superior vermian cerebellar atrophy. No visible cervical spine fracture, traumatic subluxation, spinal stenosis, disc herniation, or intrinsic cord abnormality. Post infusion imaging could not be performed due to lack of patient cooperation. No noncontrast features of Wernicke encephalopathy, but the most sensitive  evaluation requires post infusion imaging. Electronically Signed   By: Elsie Stain M.D.   On: 11/21/2018 20:53   Mr Cervical Spine Wo Contrast  Result Date: 11/21/2018 CLINICAL DATA:  Patient with alcoholism and binge drinking presents with intoxication and unable to ambulate. Severely dehydrated. Ataxia. EXAM: MRI HEAD WITHOUT CONTRAST MRI CERVICAL SPINE WITHOUT CONTRAST TECHNIQUE: Multiplanar, multiecho pulse sequences of the brain and surrounding structures, and cervical spine, to include the craniocervical junction and cervicothoracic junction, were obtained without intravenous contrast. COMPARISON:  CT head 11/19/2018 FINDINGS: The patient was uncooperative, and multiple images are motion degraded. In addition, the patient would not allow administration of contrast. The cervical spine images,  in particular, suffer from motion degradation. MRI HEAD FINDINGS Brain: Premature for age cerebral atrophy. Premature for age superior vermian cerebellar atrophy. No significant white matter disease. No acute stroke, hemorrhage, mass lesion, hydrocephalus, or extra-axial fluid. No basal ganglia T1 hyperintensity. No characteristic MR findings to suggest Wernicke encephalopathy, such as abnormal signal in the mamillary bodies, periaqueductal region/IIIrd ventricle or around the thalami, although limited assessment in noncontrast exam. Vascular: Normal flow voids. Skull and upper cervical spine: Normal marrow signal. Sinuses/Orbits: Negative. Other: None. MRI CERVICAL SPINE FINDINGS Alignment: Reversal of the normal cervical lordotic curve could be positional or due to spasm. Vertebrae: No visible fracture, evidence of diskitis, or bone lesion. Cord: Grossly normal signal and morphology. No definite intraspinal mass lesion. Posterior Fossa, vertebral arteries, paraspinal tissues: Grossly unremarkable. Disc levels: No visible disc protrusion or spinal stenosis. IMPRESSION: Motion degraded examination demonstrates no acute or focal intracranial abnormality. Premature for age cerebral atrophy and premature for age superior vermian cerebellar atrophy. No visible cervical spine fracture, traumatic subluxation, spinal stenosis, disc herniation, or intrinsic cord abnormality. Post infusion imaging could not be performed due to lack of patient cooperation. No noncontrast features of Wernicke encephalopathy, but the most sensitive evaluation requires post infusion imaging. Electronically Signed   By: Elsie Stain M.D.   On: 11/21/2018 20:53        Scheduled Meds:  diazepam  10 mg Oral Q6H   docusate sodium  100 mg Oral BID   famotidine  20 mg Oral BID   feeding supplement (ENSURE ENLIVE)  237 mL Oral BID BM   feeding supplement (PRO-STAT SUGAR FREE 64)  30 mL Oral BID   folic acid  1 mg Oral Daily    heparin  5,000 Units Subcutaneous Q8H   hydrALAZINE  50 mg Oral TID   labetalol  300 mg Oral BID   magnesium oxide  400 mg Oral BID   multivitamin with minerals  1 tablet Oral Daily   sodium chloride flush  3 mL Intravenous Q12H   thiamine  100 mg Oral Daily   Continuous Infusions:    LOS: 2 days    Time spent: 25 minutes    Dorcas Carrow, MD [facility] Pager [phone]  If 7PM-7AM, please contact night-coverage [email or web address] Password [facility] 11/22/2018, 12:55 PM

## 2018-11-22 NOTE — Progress Notes (Signed)
Patients telemetry order expired, verbal order from Dr. Jerral Ralph to take patient off telemetry.

## 2018-11-22 NOTE — Progress Notes (Signed)
Physical Therapy Treatment Patient Details Name: Brett Molina MRN: [id number] DOB: 1990-10-18 Today's Date: 11/22/2018    History of Present Illness Brett Molina is a 28 y.o. male with PHMx: th medical history significant of   hypertension, GERD, chronic alcohol abuse, history of upper GI, and depression presented to the ED on 5/16 in  alcohol intoxication state.    PT Comments    Pt received supine in bed and is agreeable to participate in therapy session. Patient is ataxic with all mobility and impulsive with transfers. He ambulated short distance this session with min assist from therapist to steady balance and with verbal cues for step sequencing. Pt required cues to reduce step length and increase BOS during gait. 3 standing breaks taken throughout to steady balance and pt had reduced tremors while standing still. He had improved step pattern with simple cues for "right...left". Pt was diaphoretic at end of ambulation and HR elevated to 136; it decreased to 89 follow 1 minute seated rest. Pt will benefit from ongoing therapy at below venue and acute PT will continue to follow to progress mobility as able.   Follow Up Recommendations  CIR;Supervision/Assistance - 24 hour     Equipment Recommendations  Other (comment)(defer to next venue)    Recommendations for Other Services       Precautions / Restrictions Precautions Precautions: Fall Precaution Comments: monitor HR and BP Restrictions Weight Bearing Restrictions: No    Mobility  Bed Mobility Overal bed mobility: Needs Assistance Bed Mobility: Supine to Sit     Supine to sit: Supervision     General bed mobility comments: supervision for safety, use of bedrails to come to sitting  Transfers Overall transfer level: Needs assistance Equipment used: Rolling walker (2 wheeled) Transfers: Sit to/from Stand Sit to Stand: Min assist;From elevated surface         General transfer comment: Min assist for  with cues for hand placement on bed to initiate stand, steadying, slow lowering to sit due to lack of eccentric control. Pt continues with posterior lean in standing initially by corrects with cues.   Ambulation/Gait Ambulation/Gait assistance: Min assist(chair follow and recommend 2 person for safety) Gait Distance (Feet): 20 Feet Assistive device: Rolling walker (2 wheeled) Gait Pattern/deviations: Step-through pattern;Ataxic;Leaning posteriorly;Staggering right;Staggering left;Narrow base of support Gait velocity: decreased   General Gait Details: Min assist for steadying and to manage RW. Pt with very unsteady ataxic gait with varying BOS and distance from RW. Cues requried to reduce step length and widen BOS intermitently. Verbal cuing for upright posture improved patients steadiness while standing.       Balance Overall balance assessment: Needs assistance Sitting-balance support: No upper extremity supported;Feet supported Sitting balance-Leahy Scale: Fair Sitting balance - Comments: pt able to sit at EOB and don socks Postural control: Posterior lean Standing balance support: Bilateral upper extremity supported;During functional activity   Standing balance comment: reliant on UE support and therapist for steadying         Cognition Arousal/Alertness: Awake/alert Behavior During Therapy: WFL for tasks assessed/performed Overall Cognitive Status: Impaired/Different from baseline Area of Impairment: Safety/judgement;Following commands      Following Commands: Follows multi-step commands consistently Safety/Judgement: Decreased awareness of safety     General Comments: pt with impulsive movements, pt aware of impaired control but requires reminders to slow down and  follow 1 step at a time; pt steadiness with gait improved with 1 step command "right, left" for ambulation and assistance with RW  Exercises      General Comments General comments (skin integrity, edema,  etc.): HR up to 136 with ambulation and pt diaphoretic, HR lowered to 89 after 1 minute of seated rest break      Pertinent Vitals/Pain Pain Assessment: No/denies pain           PT Goals (current goals can now be found in the care plan section) Acute Rehab PT Goals Patient Stated Goal: walk like before PT Goal Formulation: With patient Time For Goal Achievement: 12/05/18 Potential to Achieve Goals: Fair Progress towards PT goals: Progressing toward goals    Frequency    Min 4X/week      PT Plan Current plan remains appropriate       AM-PAC PT "6 Clicks" Mobility   Outcome Measure  Help needed turning from your back to your side while in a flat bed without using bedrails?: A Little Help needed moving from lying on your back to sitting on the side of a flat bed without using bedrails?: A Little Help needed moving to and from a bed to a chair (including a wheelchair)?: A Lot Help needed standing up from a chair using your arms (e.g., wheelchair or bedside chair)?: A Little Help needed to walk in hospital room?: A Little Help needed climbing 3-5 steps with a railing? : A Lot 6 Click Score: 16    End of Session Equipment Utilized During Treatment: Gait belt Activity Tolerance: Patient limited by fatigue Patient left: with call bell/phone within reach;in bed;with bed alarm set Nurse Communication: Mobility status PT Visit Diagnosis: Ataxic gait (R26.0);Other abnormalities of gait and mobility (R26.89)     Time: 6546-5035 PT Time Calculation (min) (ACUTE ONLY): 27 min  Charges:  $Gait Training: 23-37 mins                     Valentino Saxon, PT, DPT, [facility] Physical Therapist with [facility]  11/22/2018 4:58 PM

## 2018-11-23 LAB — BASIC METABOLIC PANEL
Anion gap: 12 (ref 5–15)
BUN: 11 mg/dL (ref 6–20)
CO2: 22 mmol/L (ref 22–32)
Calcium: 9.4 mg/dL (ref 8.9–10.3)
Chloride: 105 mmol/L (ref 98–111)
Creatinine, Ser: 0.78 mg/dL (ref 0.61–1.24)
GFR calc Af Amer: >60 mL/min (ref >60)
GFR calc non Af Amer: >60 mL/min (ref >60)
Glucose, Bld: 85 mg/dL (ref 70–99)
Potassium: 3.8 mmol/L (ref 3.5–5.1)
Sodium: 139 mmol/L (ref 135–145)

## 2018-11-23 LAB — GLUCOSE, CAPILLARY: Glucose-Capillary: 76 mg/dL (ref 70–99)

## 2018-11-23 LAB — CBC
HCT: 39.6 % (ref 39.0–52.0)
Hemoglobin: 13.1 g/dL (ref 13.0–17.0)
MCH: 34.4 pg — ABNORMAL HIGH (ref 26.0–34.0)
MCHC: 33.1 g/dL (ref 30.0–36.0)
MCV: 103.9 fL — ABNORMAL HIGH (ref 80.0–100.0)
Platelets: 300 10*3/uL (ref 150–400)
RBC: 3.81 MIL/uL — ABNORMAL LOW (ref 4.22–5.81)
RDW: 13.6 % (ref 11.5–15.5)
WBC: 9.5 10*3/uL (ref 4.0–10.5)
nRBC: 0 % (ref 0.0–0.2)

## 2018-11-23 MED ORDER — LORAZEPAM 2 MG/ML IJ SOLN
2.0000 mg | Freq: Once | INTRAMUSCULAR | Status: AC
  Administered 2018-11-24: 2 mg via INTRAVENOUS
  Filled 2018-11-23: qty 1

## 2018-11-23 MED ORDER — LORAZEPAM 2 MG/ML IJ SOLN
1.0000 mg | Freq: Once | INTRAMUSCULAR | Status: AC
  Administered 2018-11-23: 1 mg via INTRAVENOUS
  Filled 2018-11-23: qty 1

## 2018-11-23 MED ORDER — LORAZEPAM 2 MG/ML IJ SOLN
2.0000 mg | Freq: Once | INTRAMUSCULAR | Status: AC
  Administered 2018-11-23: 2 mg via INTRAVENOUS
  Filled 2018-11-23: qty 1

## 2018-11-23 MED ORDER — HALOPERIDOL LACTATE 5 MG/ML IJ SOLN
2.0000 mg | Freq: Once | INTRAMUSCULAR | Status: AC
  Administered 2018-11-23: 2 mg via INTRAVENOUS
  Filled 2018-11-23: qty 1

## 2018-11-23 NOTE — Progress Notes (Signed)
This RN has attempted to obtain a tele sitter multiple times throughout shift, no cameras are available at this time. AC and charge nurse aware. Pt is calm at this time, not attempting to get out of bed or pulling at lines, confused at times but following commands.

## 2018-11-23 NOTE — Progress Notes (Signed)
Brain and C-spine imaging with no acute changes. Continue management of Wernickes (although imaging was motion riddled and did not show a confirmatory finding for Wernickes but history is suggestive). Management of withdrawal per primary team as you are. Medical work up per primary team as you are. Outpatient neurology follow up 4-6 weeks after discharge. May need repeat brain imaging with contrast at that time - will defer based on clinical exam at that time. Please call with questions.  -- Milon Dikes, MD [facility] Pager: [phone] If 7pm to 7am, please call on call as listed on [facility]ION.

## 2018-11-23 NOTE — Progress Notes (Signed)
Upon assessment patient stated he was, "refusing care" and requested to leave. Patients current CIWA score is 14. Pt has mild sweating, moderate tremors, is anxious and slightly agitated. This RN suggested a PRN dose of 1mg  ativan to the patient to relieve the symptoms listed above. The pt refused the ativan. MD Alvino Chapel made aware of situation. Pt agreed to wait until the doctor rounded to leave. Will continue to monitor closely.

## 2018-11-23 NOTE — Progress Notes (Addendum)
Pt now having hallucinations stating, "LOOK LOOK, there is a fish in my hand!", patient then started spitting in his hand, "to keep the fish alive".  Tremors remain with moderate sweating.

## 2018-11-23 NOTE — Progress Notes (Signed)
Occupational Therapy Treatment Patient Details Name: Brett Molina MRN: [id number] DOB: October 31, 1990 Today's Date: 11/23/2018    History of present illness Brett Molina is a 28 y.o. male with PHMx: th medical history significant of   hypertension, GERD, chronic alcohol abuse, history of upper GI, and depression presented to the ED on 5/16 in  alcohol intoxication state.   OT comments  Pt presents supine in bed, requiring minA to don socks at bed level but declining further participation in ADL. Pt is agreeable to bed level exercise. Pt performing bil UE/LE exercise with intermittent rest breaks. Pt continues to present with ataxic movements throughout, requires cues for slowed, controlled movements when performing HEP. Feel he remains appropriate for CIR level therapy services at time of discharge. Will continue to follow acutely.   Follow Up Recommendations  CIR;Supervision/Assistance - 24 hour    Equipment Recommendations  Tub/shower bench          Precautions / Restrictions Precautions Precautions: Fall Precaution Comments: monitor HR and BP Restrictions Weight Bearing Restrictions: No       Mobility Bed Mobility                  Transfers                      Balance                                           ADL either performed or assessed with clinical judgement   ADL Overall ADL's : Needs assistance/impaired                       Lower Body Dressing Details (indicate cue type and reason): pt able to don socks at bed level with minA, using figure 4 technique               General ADL Comments: pt performing bed level exercise during session today     Vision       Perception     Praxis      Cognition Arousal/Alertness: Awake/alert Behavior During Therapy: WFL for tasks assessed/performed Overall Cognitive Status: Impaired/Different from baseline Area of Impairment: Safety/judgement;Following  commands                     Memory: Decreased short-term memory Following Commands: Follows one step commands with increased time;Follows one step commands consistently Safety/Judgement: Decreased awareness of safety   Problem Solving: Requires verbal cues          Exercises Exercises: General Upper Extremity;Other exercises;General Lower Extremity General Exercises - Upper Extremity Shoulder Flexion: AROM;Both;20 reps General Exercises - Lower Extremity Straight Leg Raises: Both;10 reps;Supine(raise and hold x5 seconds) Hip Flexion/Marching: Both;10 reps Other Exercises Other Exercises: chest press with light resistance applied, bil UEs x10   Shoulder Instructions       General Comments      Pertinent Vitals/ Pain       Pain Assessment: No/denies pain  Home Living                                          Prior Functioning/Environment              Frequency  Min  2X/week        Progress Toward Goals  OT Goals(current goals can now be found in the care plan section)  Progress towards OT goals: Progressing toward goals  Acute Rehab OT Goals Patient Stated Goal: walk like before OT Goal Formulation: With patient Time For Goal Achievement: 11/21/18 Potential to Achieve Goals: Good ADL Goals Pt Will Perform Grooming: with modified independence;standing Pt Will Perform Upper Body Bathing: with modified independence;sitting Pt Will Perform Lower Body Bathing: with modified independence;sit to/from stand Pt Will Perform Upper Body Dressing: with modified independence;sitting Pt Will Perform Lower Body Dressing: with modified independence;sit to/from stand Pt Will Transfer to Toilet: with modified independence;ambulating;grab bars(comfort height toilet) Pt Will Perform Toileting - Clothing Manipulation and hygiene: with modified independence;sit to/from stand Pt Will Perform Tub/Shower Transfer: Tub transfer;with modified  independence;ambulating;rolling walker;tub bench;shower seat  Plan Discharge plan remains appropriate    Co-evaluation                 AM-PAC OT "6 Clicks" Daily Activity     Outcome Measure   Help from another person eating meals?: None Help from another person taking care of personal grooming?: A Little Help from another person toileting, which includes using toliet, bedpan, or urinal?: A Lot Help from another person bathing (including washing, rinsing, drying)?: A Lot Help from another person to put on and taking off regular upper body clothing?: A Little Help from another person to put on and taking off regular lower body clothing?: A Lot 6 Click Score: 16    End of Session    OT Visit Diagnosis: Unsteadiness on feet (R26.81);Other abnormalities of gait and mobility (R26.89);Muscle weakness (generalized) (M62.81);Ataxia, unspecified (R27.0)   Activity Tolerance Patient tolerated treatment well   Patient Left in bed;with call bell/phone within reach;with bed alarm set   Nurse Communication Mobility status        Time: 1005-1017 OT Time Calculation (min): 12 min  Charges: OT General Charges $OT Visit: 1 Visit OT Treatments $Therapeutic Activity: 8-22 mins  Marcy Siren, OT Supplemental Rehabilitation Services Pager [phone] Office [phone]    Orlando Penner 11/23/2018, 11:12 AM

## 2018-11-23 NOTE — Progress Notes (Signed)
Physical Therapy Treatment Patient Details Name: Brett Molina MRN: [id number] DOB: Jul 10, 1990 Today's Date: 11/23/2018    History of Present Illness Brett Molina is a 28 y.o. male with PHMx: th medical history significant of   hypertension, GERD, chronic alcohol abuse, history of upper GI, and depression presented to the ED on 5/16 in  alcohol intoxication state.    PT Comments    Pt motivated to participate in PT, stating "I want to walk like you again". Pt ambulated 45 + 40 ft with min assist for steadying and guiding RW. Pt is very unsteady in standing and lacks some insight into severity of deficits, stating "I don't need you to hold onto me". PT initiated seated rest break after 45 ft ambulation due to tachycardia up to 145 bpm and tachypnea, pt lacking insight into needing rest. PT updated follow up recommendation to reflect SNF vs CIR, as CIR was consulted and politely decline pt admission post-acutely at this time. PT to continue to follow acutely.    Follow Up Recommendations  CIR;Supervision/Assistance - 24 hour;SNF     Equipment Recommendations  Other (comment)(defer to next venue)    Recommendations for Other Services       Precautions / Restrictions Precautions Precautions: Fall Precaution Comments: monitor HR and BP Restrictions Weight Bearing Restrictions: No    Mobility  Bed Mobility Overal bed mobility: Needs Assistance Bed Mobility: Supine to Sit     Supine to sit: Supervision     General bed mobility comments: supervision for safety, uses bedrails and increased time to sit up. Pt able to don briefs 75% of the way with verbal assist from PT.   Transfers Overall transfer level: Needs assistance Equipment used: Rolling walker (2 wheeled) Transfers: Sit to/from Stand Sit to Stand: From elevated surface;Mod assist;+2 safety/equipment         General transfer comment: Mod assist for steadying, slow lowering to sit due to lack of eccentric  control. Verbal cuing for hand placement on bed/recliner to initiate stand, pt following this command 50% of the time. Pt with AP instability in standing.  Ambulation/Gait Ambulation/Gait assistance: Min assist;+2 safety/equipment(chair follow ) Gait Distance (Feet): 45 Feet(45 + 40 ft, requiring seated rest ) Assistive device: Rolling walker (2 wheeled) Gait Pattern/deviations: Step-through pattern;Ataxic;Staggering right;Decreased stride length;Wide base of support Gait velocity: decreased   General Gait Details: Min assist for steadying, holding and managing RW as pt with tendency to push RW too far in front of him. Pt very unsteady with bodywide tremors, BOS mostly wide this session. Verbal cuing for placement in RW, sequencing, posture, performing hip and knee flexion to increase bilateral foot clearance during swing phase of gait. Pt required 2 minute seated rest break after 45 ft ambulation, HR up to 145 bpm and pt with dyspnea.  Pt walked 40 more feet before sitting in recliner and riding back to room.   Stairs             Wheelchair Mobility    Modified Rankin (Stroke Patients Only)       Balance Overall balance assessment: Needs assistance Sitting-balance support: No upper extremity supported;Feet supported Sitting balance-Leahy Scale: Fair Sitting balance - Comments: pt able to sit at EOB, don briefs with occasional assist. No LOB Postural control: Posterior lean;Other (comment)(variable AP leaning) Standing balance support: Bilateral upper extremity supported;During functional activity Standing balance-Leahy Scale: Poor Standing balance comment: reliant on UE support and PT for steadying  Cognition Arousal/Alertness: Awake/alert Behavior During Therapy: Anxious;Impulsive Overall Cognitive Status: Impaired/Different from baseline Area of Impairment: Safety/judgement;Following commands;Orientation;Attention                  Orientation Level: Disoriented to;Place;Time;Situation(Pt states "I am home, my dad is over there") Current Attention Level: Sustained Memory: Decreased short-term memory Following Commands: Follows one step commands with increased time;Follows one step commands consistently Safety/Judgement: Decreased awareness of safety;Decreased awareness of deficits   Problem Solving: Requires verbal cues General Comments: Pt tries to mobilize without PT support, states "you don't need to hold onto me, I can do this". Pt unaware of when he should take rest breaks, lack of safety awareness with hallway navigation.       Exercises General Exercises - Upper Extremity Shoulder Flexion: AROM;Both;20 reps General Exercises - Lower Extremity Straight Leg Raises: Both;10 reps;Supine(raise and hold x5 seconds) Hip Flexion/Marching: Both;10 reps Other Exercises Other Exercises: chest press with light resistance applied, bil UEs x10    General Comments General comments (skin integrity, edema, etc.): HR to 145 bpm with ambulation with associated tachypnea, pt recovered with rest      Pertinent Vitals/Pain Pain Assessment: No/denies pain    Home Living                      Prior Function            PT Goals (current goals can now be found in the care plan section) Acute Rehab PT Goals Patient Stated Goal: walk like before PT Goal Formulation: With patient Time For Goal Achievement: 12/05/18 Potential to Achieve Goals: Fair Progress towards PT goals: Progressing toward goals    Frequency    Min 3X/week      PT Plan Discharge plan needs to be updated    Co-evaluation              AM-PAC PT "6 Clicks" Mobility   Outcome Measure  Help needed turning from your back to your side while in a flat bed without using bedrails?: A Little Help needed moving from lying on your back to sitting on the side of a flat bed without using bedrails?: A Little Help needed moving to and  from a bed to a chair (including a wheelchair)?: A Lot Help needed standing up from a chair using your arms (e.g., wheelchair or bedside chair)?: A Little Help needed to walk in hospital room?: A Little Help needed climbing 3-5 steps with a railing? : A Lot 6 Click Score: 16    End of Session Equipment Utilized During Treatment: Gait belt Activity Tolerance: Patient limited by fatigue;Treatment limited secondary to medical complications (Comment) Patient left: with call bell/phone within reach;in bed;with nursing/sitter in room(NT prepping pt for sponge bath) Nurse Communication: Mobility status PT Visit Diagnosis: Ataxic gait (R26.0);Other abnormalities of gait and mobility (R26.89)     Time: 7341-9379 PT Time Calculation (min) (ACUTE ONLY): 16 min  Charges:  $Gait Training: 8-22 mins                     Nicola Police, PT Acute Rehabilitation Services Pager [phone]  Office [phone]   Danne Scardina D Despina Hidden 11/23/2018, 2:56 PM

## 2018-11-23 NOTE — Significant Event (Addendum)
Rapid Response Event Note  Overview: Time Called: 0322 Arrival Time: 0324 Event Type: Other (Comment)(tremors)  Initial Focused Assessment: Patient lying in bed. Tremors 6/7 in bilateral arms. Head tremor and bilateral eye tremor. Patient following simple commands. Grips equal. No drift in upper and lower extremities. Patient denies nausea and vomiting. Patient has been diaphoretic, but is now dry, warm to touch. Patient is having visual disturbances, as noted by staff. Patient was found to be reaching towards ceiling and caring on conversation while no one was in the room. CIWA scale at midnight 14, now 11.  Interventions: NP at bedside to assess patient's need for symptoms. NP ordered additional 2mg  Ativan to be given now.  Plan of Care (if not transferred): RN to assess patient for neurologic changes. RN to trend CIWA scale. Rapid response available for further needs.  Event Summary:     Brett Molina

## 2018-11-23 NOTE — Progress Notes (Signed)
Pt has increased anxiety and tremors with sweating, CIWA score of 14. Not yet time for PRN ativan. MD Alvino Chapel notified. MD placed order for one time dose of 1mg  IV ativan and stated if patient needs additional ativan throughout shift the patient may need to be transferred to stepdown. Will continue to monitor closely.

## 2018-11-23 NOTE — Progress Notes (Signed)
Pt BP 172/112 manually, MD notified and gave verbal orders to give PRN 10mg  labetalol dose.

## 2018-11-23 NOTE — Progress Notes (Signed)
PROGRESS NOTE    Brett Molina  ZOX:[id number] DOB: 1990/10/20 DOA: 11/19/2018 PCP: Patient, No Pcp Per     Brief Narrative:  Ramon Dredge Raffo is a 28 year old male with alcoholism and currently with heavy binge drinking, history of hypertension who presented to the emergency room with intoxication and unable to ambulate without assistance.  In the ER he was intoxicated with alcohol level of 411.  He was also found to be severely dehydrated.  Treated in the emergency room and found to be ataxic to ambulate and with alcohol withdrawal so admitted to the hospital.   New events last 24 hours / Subjective: Notified by RN the patient requesting to leave AMA.  Patient is currently alert and oriented, but has episodes of hallucinations and poor insight.  He tells me that he was pulled over when he had accidentally pulled into a parking garage, but it was a mistake.  He then tells me that he knows how to manage his medical conditions by taking his vitamins and keeping his cholesterol in check.  He states that his girlfriend can come pick him up, and his father is in the other room (pointing to the bathroom in the patient's room) wearing an orange shirt.  He has episodes of being lucid during conversation, but overall is unable to make medical decisions for himself due to his hallucinations due to alcohol withdrawal.  Patient may not leave AMA. Ordered Soil scientist.   Assessment & Plan:   Principal Problem:   Alcohol withdrawal (HCC) Active Problems:   Alcoholic hepatitis without ascites   Alcohol abuse   Gastroesophageal reflux disease with esophagitis   Ataxia   HTN (hypertension)   Alcohol intoxication with alcohol withdrawal, alcoholism with ataxia -Continue on CIWA scale, Valium q6h  -Fall precautions. Delirium precautions. -Thiamine 500 mg IV once a day for 3 days due to concern for Wernicke's encephalopathy  -Appreciate Neurology. MRI of the brain and C-spine which was  suboptimal, however did not show any significant pathology.  -Continue folate  Hypertension -Continue hydralazine, labetalol   GERD -Continue Pepcid twice daily   DVT prophylaxis: Subq hep  Code Status: Full Family Communication: None  Disposition Plan: Pending improvement in condition. May not leave AMA.    Consultants:   Neurology  Procedures:   None   Antimicrobials:  Anti-infectives (From admission, onward)   None       Objective: Vitals:   11/23/18 0322 11/23/18 0500 11/23/18 0519 11/23/18 1101  BP: (!) 116/54  136/82 (!) 150/112  Pulse: (!) 121  (!) 122 (!) 120  Resp: (!) 22  (!) 22   Temp: 99.3 F (37.4 C)  99.1 F (37.3 C)   TempSrc: Oral  Oral   SpO2: 97%  97%   Weight:  94.4 kg    Height:        Intake/Output Summary (Last 24 hours) at 11/23/2018 1208 Last data filed at 11/23/2018 0730 Gross per 24 hour  Intake 550 ml  Output 1225 ml  Net -675 ml   Filed Weights   11/21/18 0516 11/22/18 0500 11/23/18 0500  Weight: 97.6 kg 95.7 kg 94.4 kg    Examination:  General exam: Appears calm with tremors  Respiratory system: Clear to auscultation. Respiratory effort normal. Cardiovascular system: S1 & S2 heard, tachycardic, regular rhythm. No JVD, murmurs, rubs, gallops or clicks. No pedal edema. Gastrointestinal system: Abdomen is nondistended, soft and nontender. No organomegaly or masses felt. Normal bowel sounds heard. Central nervous system: Alert  and oriented to self and place, has episodes of hallucinations during conversation. Extremities: Symmetric 5 x 5 power. Skin: No rashes, lesions or ulcers Psychiatry: Judgement and insight appear poor  Data Reviewed: I have personally reviewed following labs and imaging studies  CBC: Recent Labs  Lab 11/20/18 0621 11/21/18 0420 11/22/18 0720 11/23/18 0539  WBC 13.9* 9.7 9.7 9.5  NEUTROABS 11.7*  --   --   --   HGB 12.5* 12.8* 12.9* 13.1  HCT 37.1* 37.9* 40.2 39.6  MCV 103.9* 103.8* 104.7*  103.9*  PLT 366 345 306 300   Basic Metabolic Panel: Recent Labs  Lab 11/19/18 1745 11/20/18 0714 11/21/18 0420 11/22/18 0720 11/23/18 0539  NA 143  --  139 141 139  K 3.5  --  3.6 3.6 3.8  CL 107  --  107 105 105  CO2 21*  --  GLUCOSE 99  --  100* 93 85  BUN <5*  --  <5* 7 11  CREATININE 0.67 0.64 0.69 0.78 0.78  CALCIUM 9.2  --  8.9 9.3 9.4  MG  --  1.6*  --   --   --   PHOS  --  3.2  --   --   --    GFR: Estimated Creatinine Clearance: 157 mL/min (by C-G formula based on SCr of 0.78 mg/dL). Liver Function Tests: Recent Labs  Lab 11/19/18 1745  AST 31  ALT 39  ALKPHOS 67  BILITOT 0.3  PROT 7.9  ALBUMIN 4.3   Recent Labs  Lab 11/19/18 1745  LIPASE 29   No results for input(s): AMMONIA in the last 168 hours. Coagulation Profile: Recent Labs  Lab 11/19/18 1745 11/21/18 0420  INR 1.0 1.1   Cardiac Enzymes: Recent Labs  Lab 11/19/18 1744 11/20/18 0714  CKTOTAL 154 143   BNP (last 3 results) No results for input(s): PROBNP in the last 8760 hours. HbA1C: Recent Labs    11/20/18 1632  HGBA1C 5.7*   CBG: Recent Labs  Lab 11/19/18 2141 11/21/18 0741 11/22/18 0727 11/23/18 0712  GLUCAP 81 92 85 76   Lipid Profile: No results for input(s): CHOL, HDL, LDLCALC, TRIG, CHOLHDL, LDLDIRECT in the last 72 hours. Thyroid Function Tests: Recent Labs    11/20/18 1632  TSH 4.439   Anemia Panel: No results for input(s): VITAMINB12, FOLATE, FERRITIN, TIBC, IRON, RETICCTPCT in the last 72 hours. Sepsis Labs: Recent Labs  Lab 11/19/18 2123 11/19/18 2323 11/20/18 0550 11/20/18 1632  LATICACIDVEN 3.2* 3.0* 3.2* 1.7    Recent Results (from the past 240 hour(s))  SARS Coronavirus 2 (CEPHEID - Performed in [facility] hospital lab), Hosp Order     Status: None   Collection Time: 11/19/18  5:45 PM  Result Value Ref Range Status   SARS Coronavirus 2 NEGATIVE NEGATIVE Final    Comment: (NOTE) If result is NEGATIVE SARS-CoV-2 target nucleic  acids are NOT DETECTED. The SARS-CoV-2 RNA is generally detectable in upper and lower  respiratory specimens during the acute phase of infection. The lowest  concentration of SARS-CoV-2 viral copies this assay can detect is 250  copies / mL. A negative result does not preclude SARS-CoV-2 infection  and should not be used as the sole basis for treatment or other  patient management decisions.  A negative result may occur with  improper specimen collection / handling, submission of specimen other  than nasopharyngeal swab, presence of viral mutation(s) within the  areas targeted by this assay, and  inadequate number of viral copies  (<250 copies / mL). A negative result must be combined with clinical  observations, patient history, and epidemiological information. If result is POSITIVE SARS-CoV-2 target nucleic acids are DETECTED. The SARS-CoV-2 RNA is generally detectable in upper and lower  respiratory specimens dur ing the acute phase of infection.  Positive  results are indicative of active infection with SARS-CoV-2.  Clinical  correlation with patient history and other diagnostic information is  necessary to determine patient infection status.  Positive results do  not rule out bacterial infection or co-infection with other viruses. If result is PRESUMPTIVE POSTIVE SARS-CoV-2 nucleic acids MAY BE PRESENT.   A presumptive positive result was obtained on the submitted specimen  and confirmed on repeat testing.  While 2019 novel coronavirus  (SARS-CoV-2) nucleic acids may be present in the submitted sample  additional confirmatory testing may be necessary for epidemiological  and / or clinical management purposes  to differentiate between  SARS-CoV-2 and other Sarbecovirus currently known to infect humans.  If clinically indicated additional testing with an alternate test  methodology [phone]) is advised. The SARS-CoV-2 RNA is generally  detectable in upper and lower respiratory  sp ecimens during the acute  phase of infection. The expected result is Negative. Fact Sheet for Patients:  BoilerBrush.com.cy Fact Sheet for Healthcare Providers: https://pope.com/ This test is not yet approved or cleared by the [place] FDA and has been authorized for detection and/or diagnosis of SARS-CoV-2 by FDA under an Emergency Use Authorization (EUA).  This EUA will remain in effect (meaning this test can be used) for the duration of the COVID-19 declaration under Section 564(b)(1) of the Act, 21 U.S.C. section 360bbb-3(b)(1), unless the authorization is terminated or revoked sooner. Performed at [facility], [address] [place], [place] [address]        Radiology Studies: Mr Brain Wo Contrast  Result Date: 11/21/2018 CLINICAL DATA:  Patient with alcoholism and binge drinking presents with intoxication and unable to ambulate. Severely dehydrated. Ataxia. EXAM: MRI HEAD WITHOUT CONTRAST MRI CERVICAL SPINE WITHOUT CONTRAST TECHNIQUE: Multiplanar, multiecho pulse sequences of the brain and surrounding structures, and cervical spine, to include the craniocervical junction and cervicothoracic junction, were obtained without intravenous contrast. COMPARISON:  CT head 11/19/2018 FINDINGS: The patient was uncooperative, and multiple images are motion degraded. In addition, the patient would not allow administration of contrast. The cervical spine images, in particular, suffer from motion degradation. MRI HEAD FINDINGS Brain: Premature for age cerebral atrophy. Premature for age superior vermian cerebellar atrophy. No significant white matter disease. No acute stroke, hemorrhage, mass lesion, hydrocephalus, or extra-axial fluid. No basal ganglia T1 hyperintensity. No characteristic MR findings to suggest Wernicke encephalopathy, such as abnormal signal in the mamillary bodies, periaqueductal region/IIIrd ventricle or  around the thalami, although limited assessment in noncontrast exam. Vascular: Normal flow voids. Skull and upper cervical spine: Normal marrow signal. Sinuses/Orbits: Negative. Other: None. MRI CERVICAL SPINE FINDINGS Alignment: Reversal of the normal cervical lordotic curve could be positional or due to spasm. Vertebrae: No visible fracture, evidence of diskitis, or bone lesion. Cord: Grossly normal signal and morphology. No definite intraspinal mass lesion. Posterior Fossa, vertebral arteries, paraspinal tissues: Grossly unremarkable. Disc levels: No visible disc protrusion or spinal stenosis. IMPRESSION: Motion degraded examination demonstrates no acute or focal intracranial abnormality. Premature for age cerebral atrophy and premature for age superior vermian cerebellar atrophy. No visible cervical spine fracture, traumatic subluxation, spinal stenosis, disc herniation, or intrinsic cord abnormality. Post infusion  imaging could not be performed due to lack of patient cooperation. No noncontrast features of Wernicke encephalopathy, but the most sensitive evaluation requires post infusion imaging. Electronically Signed   By: Elsie Stain M.D.   On: 11/21/2018 20:53   Mr Cervical Spine Wo Contrast  Result Date: 11/21/2018 CLINICAL DATA:  Patient with alcoholism and binge drinking presents with intoxication and unable to ambulate. Severely dehydrated. Ataxia. EXAM: MRI HEAD WITHOUT CONTRAST MRI CERVICAL SPINE WITHOUT CONTRAST TECHNIQUE: Multiplanar, multiecho pulse sequences of the brain and surrounding structures, and cervical spine, to include the craniocervical junction and cervicothoracic junction, were obtained without intravenous contrast. COMPARISON:  CT head 11/19/2018 FINDINGS: The patient was uncooperative, and multiple images are motion degraded. In addition, the patient would not allow administration of contrast. The cervical spine images, in particular, suffer from motion degradation. MRI HEAD  FINDINGS Brain: Premature for age cerebral atrophy. Premature for age superior vermian cerebellar atrophy. No significant white matter disease. No acute stroke, hemorrhage, mass lesion, hydrocephalus, or extra-axial fluid. No basal ganglia T1 hyperintensity. No characteristic MR findings to suggest Wernicke encephalopathy, such as abnormal signal in the mamillary bodies, periaqueductal region/IIIrd ventricle or around the thalami, although limited assessment in noncontrast exam. Vascular: Normal flow voids. Skull and upper cervical spine: Normal marrow signal. Sinuses/Orbits: Negative. Other: None. MRI CERVICAL SPINE FINDINGS Alignment: Reversal of the normal cervical lordotic curve could be positional or due to spasm. Vertebrae: No visible fracture, evidence of diskitis, or bone lesion. Cord: Grossly normal signal and morphology. No definite intraspinal mass lesion. Posterior Fossa, vertebral arteries, paraspinal tissues: Grossly unremarkable. Disc levels: No visible disc protrusion or spinal stenosis. IMPRESSION: Motion degraded examination demonstrates no acute or focal intracranial abnormality. Premature for age cerebral atrophy and premature for age superior vermian cerebellar atrophy. No visible cervical spine fracture, traumatic subluxation, spinal stenosis, disc herniation, or intrinsic cord abnormality. Post infusion imaging could not be performed due to lack of patient cooperation. No noncontrast features of Wernicke encephalopathy, but the most sensitive evaluation requires post infusion imaging. Electronically Signed   By: Elsie Stain M.D.   On: 11/21/2018 20:53      Scheduled Meds:  diazepam  10 mg Oral Q6H   docusate sodium  100 mg Oral BID   famotidine  20 mg Oral BID   feeding supplement (ENSURE ENLIVE)  237 mL Oral BID BM   feeding supplement (PRO-STAT SUGAR FREE 64)  30 mL Oral BID   folic acid  1 mg Oral Daily   heparin  5,000 Units Subcutaneous Q8H   hydrALAZINE  50 mg Oral  TID   labetalol  300 mg Oral BID   magnesium oxide  400 mg Oral BID   multivitamin with minerals  1 tablet Oral Daily   sodium chloride flush  3 mL Intravenous Q12H   thiamine  100 mg Oral Daily   Continuous Infusions:   LOS: 3 days    Time spent: 40 minutes   Noralee Stain, DO [facility] [email or web address] 11/23/2018, 12:08 PM

## 2018-11-23 NOTE — Progress Notes (Signed)
Upon rounding on pt, this RN noted pt to be reaching his arms in the air. When asked what he was reaching for pt stated "I am trying to grab these books." Pt reoriented. Pt also noted to have extensive tremors and beads of sweat on his forehead. Repeat CIWA done on pt and total is 15 compared to previous CIWA of 5. PRN ativan not due at this time. Findings reported to on call provider, Craige Cotta. New order placed for 2mg  of ativan. Will carry out order and continue to monitor closely.

## 2018-11-24 LAB — MRSA PCR SCREENING: MRSA by PCR: NEGATIVE

## 2018-11-24 MED ORDER — LIP MEDEX EX OINT
1.0000 "application " | TOPICAL_OINTMENT | CUTANEOUS | Status: DC | PRN
  Filled 2018-11-24: qty 7

## 2018-11-24 MED ORDER — LORAZEPAM 2 MG/ML IJ SOLN
2.0000 mg | INTRAMUSCULAR | Status: DC | PRN
  Administered 2018-11-24 (×2): 2 mg via INTRAVENOUS
  Administered 2018-11-24: 3 mg via INTRAVENOUS
  Administered 2018-11-25: 2 mg via INTRAVENOUS
  Filled 2018-11-24 (×2): qty 1
  Filled 2018-11-24: qty 2
  Filled 2018-11-24: qty 1

## 2018-11-24 MED ORDER — ORAL CARE MOUTH RINSE
15.0000 mL | Freq: Two times a day (BID) | OROMUCOSAL | Status: DC
  Administered 2018-11-24 – 2018-12-06 (×21): 15 mL via OROMUCOSAL

## 2018-11-24 MED ORDER — CHLORHEXIDINE GLUCONATE CLOTH 2 % EX PADS
6.0000 | MEDICATED_PAD | Freq: Every day | CUTANEOUS | Status: DC
  Administered 2018-11-24 – 2018-12-01 (×6): 6 via TOPICAL

## 2018-11-24 MED ORDER — ENSURE ENLIVE PO LIQD
237.0000 mL | Freq: Three times a day (TID) | ORAL | Status: DC
  Administered 2018-11-24 – 2018-12-07 (×35): 237 mL via ORAL

## 2018-11-24 NOTE — Progress Notes (Signed)
Throughout shift pt's CIWA score continued to increase despite scheduled and ordered interventions from [place], NP. Pt attempting to jump out of bed despite having safety sitter at bedside. Pt is disoriented x4, diaphoretic, 6/7 tremors in all extremities, and continues to hallucinate. Pt's CIWA score ranging from 19-22. On call provider made aware. New order to transfer pt to stepdown unit. All belongings sent with pt.

## 2018-11-24 NOTE — Progress Notes (Signed)
PROGRESS NOTE    Brett Molina  WJX:[id number] DOB: Jul 07, 1990 DOA: 11/19/2018 PCP: Patient, No Pcp Per     Brief Narrative:  Brett Molina is a 28 year old male with alcoholism and currently with heavy binge drinking, history of hypertension who presented to the emergency room with intoxication and unable to ambulate without assistance.  In the ER he was intoxicated with alcohol level of 411.  He was also found to be severely dehydrated.  Treated in the emergency room and found to be ataxic to ambulate and with alcohol withdrawal so admitted to the hospital.   New events last 24 hours / Subjective: Patient was transferred to stepdown unit overnight due to increasing CIWA scale and worsening disorientation, hallucinations.  This morning, patient is alert, awake, oriented to self only.  He states that he is here because he is "here to play football."  Assessment & Plan:   Principal Problem:   Alcohol withdrawal (HCC) Active Problems:   Alcoholic hepatitis without ascites   Alcohol abuse   Gastroesophageal reflux disease with esophagitis   Ataxia   HTN (hypertension)   Alcohol intoxication with alcohol withdrawal, alcoholism with ataxia -CIWA scale increased to stepdown unit order set -Fall precautions. Delirium precautions. -Appreciate Neurology. MRI of the brain and C-spine which was suboptimal, however did not show any significant pathology. Thiamine 500 mg IV once a day for 3 days due to concern for Wernicke's encephalopathy.  Now on oral 100 mg daily. -Continue folate  Hypertension -Continue hydralazine, labetalol   GERD -Continue Pepcid twice daily   DVT prophylaxis: Subq hep  Code Status: Full Family Communication: None  Disposition Plan: Pending improvement in condition, remain in stepdown unit today    Consultants:   Neurology  Procedures:   None   Antimicrobials:  Anti-infectives (From admission, onward)   None       Objective: Vitals:    11/24/18 0500 11/24/18 0735 11/24/18 0808 11/24/18 1008  BP:   (!) 150/129 (!) 140/91  Pulse:   (!) 126 (!) 130  Resp:   (!) 23   Temp:  99.2 F (37.3 C)    TempSrc:  Oral    SpO2:   97%   Weight: 96.9 kg     Height:        Intake/Output Summary (Last 24 hours) at 11/24/2018 1159 Last data filed at 11/24/2018 1020 Gross per 24 hour  Intake 603 ml  Output 650 ml  Net -47 ml   Filed Weights   11/22/18 0500 11/23/18 0500 11/24/18 0500  Weight: 95.7 kg 94.4 kg 96.9 kg    Examination: General exam: Disoriented, pleasant, nontoxic-appearing, tremors Respiratory system: Clear to auscultation. Respiratory effort normal. Cardiovascular system: S1 & S2 heard, tachycardic, regular rhythm Gastrointestinal system: Abdomen is nondistended, soft and nontender. No organomegaly or masses felt. Normal bowel sounds heard. Central nervous system: Alert and oriented to self only Extremities: Symmetric  Skin: No rashes, lesions or ulcers Psychiatry: Judgement and insight appear poor, disoriented   Data Reviewed: I have personally reviewed following labs and imaging studies  CBC: Recent Labs  Lab 11/20/18 0621 11/21/18 0420 11/22/18 0720 11/23/18 0539  WBC 13.9* 9.7 9.7 9.5  NEUTROABS 11.7*  --   --   --   HGB 12.5* 12.8* 12.9* 13.1  HCT 37.1* 37.9* 40.2 39.6  MCV 103.9* 103.8* 104.7* 103.9*  PLT 366 345 306 300   Basic Metabolic Panel: Recent Labs  Lab 11/19/18 1745 11/20/18 0714 11/21/18 0420 11/22/18 0720 11/23/18  0539  NA 143  --  139 141 139  K 3.5  --  3.6 3.6 3.8  CL 107  --  107 105 105  CO2 21*  --  GLUCOSE 99  --  100* 93 85  BUN <5*  --  <5* 7 11  CREATININE 0.67 0.64 0.69 0.78 0.78  CALCIUM 9.2  --  8.9 9.3 9.4  MG  --  1.6*  --   --   --   PHOS  --  3.2  --   --   --    GFR: Estimated Creatinine Clearance: 157 mL/min (by C-G formula based on SCr of 0.78 mg/dL). Liver Function Tests: Recent Labs  Lab 11/19/18 1745  AST 31  ALT 39  ALKPHOS  67  BILITOT 0.3  PROT 7.9  ALBUMIN 4.3   Recent Labs  Lab 11/19/18 1745  LIPASE 29   No results for input(s): AMMONIA in the last 168 hours. Coagulation Profile: Recent Labs  Lab 11/19/18 1745 11/21/18 0420  INR 1.0 1.1   Cardiac Enzymes: Recent Labs  Lab 11/19/18 1744 11/20/18 0714  CKTOTAL 154 143   BNP (last 3 results) No results for input(s): PROBNP in the last 8760 hours. HbA1C: No results for input(s): HGBA1C in the last 72 hours. CBG: Recent Labs  Lab 11/19/18 2141 11/21/18 0741 11/22/18 0727 11/23/18 0712  GLUCAP 81 92 85 76   Lipid Profile: No results for input(s): CHOL, HDL, LDLCALC, TRIG, CHOLHDL, LDLDIRECT in the last 72 hours. Thyroid Function Tests: No results for input(s): TSH, T4TOTAL, FREET4, T3FREE, THYROIDAB in the last 72 hours. Anemia Panel: No results for input(s): VITAMINB12, FOLATE, FERRITIN, TIBC, IRON, RETICCTPCT in the last 72 hours. Sepsis Labs: Recent Labs  Lab 11/19/18 2123 11/19/18 2323 11/20/18 0550 11/20/18 1632  LATICACIDVEN 3.2* 3.0* 3.2* 1.7    Recent Results (from the past 240 hour(s))  SARS Coronavirus 2 (CEPHEID - Performed in [facility] hospital lab), Hosp Order     Status: None   Collection Time: 11/19/18  5:45 PM  Result Value Ref Range Status   SARS Coronavirus 2 NEGATIVE NEGATIVE Final    Comment: (NOTE) If result is NEGATIVE SARS-CoV-2 target nucleic acids are NOT DETECTED. The SARS-CoV-2 RNA is generally detectable in upper and lower  respiratory specimens during the acute phase of infection. The lowest  concentration of SARS-CoV-2 viral copies this assay can detect is 250  copies / mL. A negative result does not preclude SARS-CoV-2 infection  and should not be used as the sole basis for treatment or other  patient management decisions.  A negative result may occur with  improper specimen collection / handling, submission of specimen other  than nasopharyngeal swab, presence of viral mutation(s) within  the  areas targeted by this assay, and inadequate number of viral copies  (<250 copies / mL). A negative result must be combined with clinical  observations, patient history, and epidemiological information. If result is POSITIVE SARS-CoV-2 target nucleic acids are DETECTED. The SARS-CoV-2 RNA is generally detectable in upper and lower  respiratory specimens dur ing the acute phase of infection.  Positive  results are indicative of active infection with SARS-CoV-2.  Clinical  correlation with patient history and other diagnostic information is  necessary to determine patient infection status.  Positive results do  not rule out bacterial infection or co-infection with other viruses. If result is PRESUMPTIVE POSTIVE SARS-CoV-2 nucleic acids MAY BE PRESENT.   A presumptive positive  result was obtained on the submitted specimen  and confirmed on repeat testing.  While 2019 novel coronavirus  (SARS-CoV-2) nucleic acids may be present in the submitted sample  additional confirmatory testing may be necessary for epidemiological  and / or clinical management purposes  to differentiate between  SARS-CoV-2 and other Sarbecovirus currently known to infect humans.  If clinically indicated additional testing with an alternate test  methodology [phone]) is advised. The SARS-CoV-2 RNA is generally  detectable in upper and lower respiratory sp ecimens during the acute  phase of infection. The expected result is Negative. Fact Sheet for Patients:  BoilerBrush.com.cy Fact Sheet for Healthcare Providers: https://pope.com/ This test is not yet approved or cleared by the [place] FDA and has been authorized for detection and/or diagnosis of SARS-CoV-2 by FDA under an Emergency Use Authorization (EUA).  This EUA will remain in effect (meaning this test can be used) for the duration of the COVID-19 declaration under Section 564(b)(1) of the Act, 21  U.S.C. section 360bbb-3(b)(1), unless the authorization is terminated or revoked sooner. Performed at [facility], [address], [place], [place] [address]   MRSA PCR Screening     Status: None   Collection Time: 11/24/18  2:00 AM  Result Value Ref Range Status   MRSA by PCR NEGATIVE NEGATIVE Final    Comment:        The GeneXpert MRSA Assay (FDA approved for NASAL specimens only), is one component of a comprehensive MRSA colonization surveillance program. It is not intended to diagnose MRSA infection nor to guide or monitor treatment for MRSA infections. Performed at [facility], [address], [place], [place] [address]        Radiology Studies: No results found.    Scheduled Meds: . Chlorhexidine Gluconate Cloth  6 each Topical Daily  . docusate sodium  100 mg Oral BID  . famotidine  20 mg Oral BID  . feeding supplement (ENSURE ENLIVE)  237 mL Oral TID BM  . feeding supplement (PRO-STAT SUGAR FREE 64)  30 mL Oral BID  . folic acid  1 mg Oral Daily  . heparin  5,000 Units Subcutaneous Q8H  . hydrALAZINE  50 mg Oral TID  . labetalol  300 mg Oral BID  . magnesium oxide  400 mg Oral BID  . mouth rinse  15 mL Mouth Rinse BID  . multivitamin with minerals  1 tablet Oral Daily  . sodium chloride flush  3 mL Intravenous Q12H  . thiamine  100 mg Oral Daily   Continuous Infusions:   LOS: 4 days    Time spent: 25 minutes   Noralee Stain, DO [facility] [email or web address] 11/24/2018, 11:59 AM

## 2018-11-24 NOTE — Progress Notes (Signed)
Nutrition Follow-up  RD working remotely.   DOCUMENTATION CODES:   Not applicable  INTERVENTION:  - continue 30 ml prostat BID. - will increase Ensure Enlive from BID to TID.   - continue to encourage PO intakes.    NUTRITION DIAGNOSIS:   Inadequate oral intake related to lethargy/confusion, acute illness as evidenced by meal completion < 50%. -revised, ongoing   GOAL:   Patient will meet greater than or equal to 90% of their needs -unmet on average  MONITOR:   PO intake, Supplement acceptance, Labs, Weight trends  ASSESSMENT:   28 y.o. male with medical history significant of HTN, GERD, chronic alcohol abuse, history of upper GIB, and depression. He presented to the ED on 5/16 in alcohol intoxication state. He was kept in the ED and treated with CIWA protocol, IV fluid hydration. Patient reported that he had not been able to ambulate in the past 2-3 weeks, has borrowed a walker to assist him in ambulation. Admits that his drinking habit history started at age 56, and that in the past 2 months  he has been drinking heavily, typically drinking 1.5L of wine/day and did so about an hour before presentation to the ED on 5/16. Patient reports heavy drinking d/t being laid off from his job due to the pandemic.  Weight fluctuating since admission on 5/17. Per RN flow sheet, patient is disoriented x4 today and notes indicate visual hallucinations including patient reporting seeing his dad (who is not present). Per flow sheet, patient consumed 0% of lunch on 5/18; 25% of breakfast and 0% of dinner on 5/19; 25% of lunch on 5/20. No other intakes documented. Per review of orders, he has accepted Ensure Enlive 4 of the 5 times offered and prostat 4 of the 6 times offered.   Per notes: alcohol intoxication with alcohol withdrawal, alcoholism with ataxia, s/p MRI brain and cervical spine with no notable findings. No plans concerning d/c at this time.    Medications reviewed; 100 mg colace BID,  20 mg oral pepcid BID, 1 mg folvite/day, 400 mg Mag-ox BID, daily multivitamin with minerals, 100 mg oral thiamine/day.  Labs reviewed.    Diet Order:   Diet Order            Diet regular Room service appropriate? Yes; Fluid consistency: Thin  Diet effective now              EDUCATION NEEDS:   Not appropriate for education at this time  Skin:  Skin Assessment: Reviewed RN Assessment  Last BM:  5/19  Height:   Ht Readings from Last 1 Encounters:  11/24/18 6\' 2"  (1.88 m)    Weight:   Wt Readings from Last 1 Encounters:  11/24/18 96.9 kg    Ideal Body Weight:  86.4 kg  BMI:  Body mass index is 27.43 kg/m.  Estimated Nutritional Needs:   Kcal:  9675-9163 kcal  Protein:  120-130 grams  Fluid:  >/= 2.2 L/day     Trenton Gammon, MS, RD, LDN, [facility] Inpatient Clinical Dietitian Pager # [phone] After hours/weekend pager # [phone]

## 2018-11-24 NOTE — Progress Notes (Signed)
CM stopped by to speak with patient.  Bedside Rn reports patient is disoriented at this time. CM will follow-up when pt is alert and oriented.

## 2018-11-25 MED ORDER — LORAZEPAM 1 MG PO TABS
1.0000 mg | ORAL_TABLET | Freq: Four times a day (QID) | ORAL | Status: DC | PRN
  Administered 2018-11-25 – 2018-11-26 (×3): 1 mg via ORAL
  Filled 2018-11-25 (×3): qty 1

## 2018-11-25 MED ORDER — THIAMINE HCL 100 MG/ML IJ SOLN: 100.0000 mg | Freq: Every day | INTRAMUSCULAR | Status: DC

## 2018-11-25 MED ORDER — LORAZEPAM 2 MG/ML IJ SOLN: 1.0000 mg | Freq: Four times a day (QID) | INTRAMUSCULAR | Status: DC | PRN

## 2018-11-25 NOTE — Progress Notes (Signed)
Physical Therapy Treatment Patient Details Name: Brett Molina MRN: [id number] DOB: [birth year] Today's Date: 11/25/2018    History of Present Illness Brett Molina is a 28 y.o. male with PHMx: th medical history significant of   hypertension, GERD, chronic alcohol abuse, history of upper GI, and depression presented to the ED on 5/16 in  alcohol intoxication state.    PT Comments    Pt with pleasant affect and motivated to participate in PT and OT today. Focus of PT gait training was increasing foot clearance and posture during ambulation. Pt limited in ambulation distance this session due to fatigue and LE weakness. Pt also very diaphoretic, tachypnea and LE quivering with fatigue noted. Pt lacking awareness into this, stating he wanted to keep walking.  PT continuing to recommend SNF level of care upon d/c. Will continue to follow acutely.    Follow Up Recommendations  Supervision/Assistance - 24 hour;SNF     Equipment Recommendations  Other (comment)(defer to next venue)    Recommendations for Other Services       Precautions / Restrictions Precautions Precautions: Fall Restrictions Weight Bearing Restrictions: No    Mobility  Bed Mobility Overal bed mobility: Needs Assistance Bed Mobility: Supine to Sit     Supine to sit: Min guard     General bed mobility comments: Min guard for safety, multiple verbal cues to wait for assist before mobilizing and to slow down.   Transfers Overall transfer level: Needs assistance Equipment used: Rolling walker (2 wheeled) Transfers: Sit to/from Stand Sit to Stand: From elevated surface;Mod assist;+2 safety/equipment         General transfer comment: Mod assist +2 for steadying, slow eccentric lowering as pt with tendency to sit without control and without reaching for sitting surface. Sit to stand x3, twice from bed, once from recliner.   Ambulation/Gait Ambulation/Gait assistance: Min assist;+2  safety/equipment(chair follow ) Gait Distance (Feet): 25 Feet(25+20 ft) Assistive device: Rolling walker (2 wheeled) Gait Pattern/deviations: Step-through pattern;Ataxic;Staggering right;Decreased stride length;Wide base of support Gait velocity: decreased   General Gait Details: min assist +2 for steadying, verbal cuing for increasing hip and knee flexion for foot clearance, upright posture. Pt very tremulous and unsteady. Pt required seated rest after 25 ft ambulation, pt very diaphoretic and standing in increased hip and knee flexion due to fatigue. Pt with uncontrolled descent into recliner for rest, encouraged for slow and controlled descent with UE support on destination surface before sitting.    Stairs             Wheelchair Mobility    Modified Rankin (Stroke Patients Only)       Balance Overall balance assessment: Needs assistance Sitting-balance support: No upper extremity supported;Feet supported Sitting balance-Leahy Scale: Fair   Postural control: Other (comment)(variable AP leaning) Standing balance support: Bilateral upper extremity supported;During functional activity Standing balance-Leahy Scale: Poor Standing balance comment: reliant on UE support and PT for steadying                            Cognition Arousal/Alertness: Awake/alert Behavior During Therapy: Impulsive Overall Cognitive Status: Impaired/Different from baseline Area of Impairment: Safety/judgement;Following commands;Orientation;Attention                 Orientation Level: (A&O x4 today) Current Attention Level: Sustained   Following Commands: Follows one step commands with increased time;Follows one step commands consistently Safety/Judgement: Decreased awareness of safety;Decreased awareness of deficits   Problem Solving:  Requires verbal cues        Exercises      General Comments        Pertinent Vitals/Pain Pain Assessment: No/denies pain    Home  Living                      Prior Function            PT Goals (current goals can now be found in the care plan section) Acute Rehab PT Goals Patient Stated Goal: walk like before PT Goal Formulation: With patient Time For Goal Achievement: 12/05/18 Potential to Achieve Goals: Fair Progress towards PT goals: Progressing toward goals    Frequency    Min 3X/week      PT Plan Current plan remains appropriate    Co-evaluation PT/OT/SLP Co-Evaluation/Treatment: Yes Reason for Co-Treatment: For patient/therapist safety PT goals addressed during session: Mobility/safety with mobility OT goals addressed during session: ADL's and self-care      AM-PAC PT "6 Clicks" Mobility   Outcome Measure  Help needed turning from your back to your side while in a flat bed without using bedrails?: A Little Help needed moving from lying on your back to sitting on the side of a flat bed without using bedrails?: A Little Help needed moving to and from a bed to a chair (including a wheelchair)?: A Lot Help needed standing up from a chair using your arms (e.g., wheelchair or bedside chair)?: A Little Help needed to walk in hospital room?: A Little Help needed climbing 3-5 steps with a railing? : A Lot 6 Click Score: 16    End of Session Equipment Utilized During Treatment: Gait belt Activity Tolerance: Patient limited by fatigue Patient left: with call bell/phone within reach;in chair;with chair alarm set Nurse Communication: Mobility status PT Visit Diagnosis: Ataxic gait (R26.0);Other abnormalities of gait and mobility (R26.89)     Time: 5003-7048 PT Time Calculation (min) (ACUTE ONLY): 26 min  Charges:  $Gait Training: 8-22 mins                     Nicola Police, PT Acute Rehabilitation Services Pager [phone]  Office [phone]   Brett Molina 11/25/2018, 3:45 PM

## 2018-11-25 NOTE — TOC Progression Note (Signed)
Transition of Care [facility]) - Progression Note    Patient Details  Name: Brett Molina MRN: [id number] Date of Birth: 1990-08-19  [facility] CM/SW Contact  Armanda Heritage, RN Phone Number: 11/25/2018, 11:20 AM  Clinical Narrative:    CM spoke with patient regarding recommendations for SNF vs 24-hours supervision. Patient prefers to return home at discharge and receive HHPT to get stronger.  CM reached out to [facility] agencies ([facility])  for charity options, awaiting response.  CM also spoke to patient regarding his alcohol intake and patient reports he is interested in quitting and has attempted to in the past unsuccessfully. CM provided patient with outpatient resources to assist with his goal of quitting.  CM further discussed with patient the need for pcp follow-up.  Patient states does not have pcp at this time. CM discussed the [facility] which can meet pt pcp needs and also has an onsite pharmacy should patient need any prescriptions filled. [facility] information added to AVS.     Expected Discharge Plan: Home/Self Care Barriers to Discharge: No Barriers Identified  Expected Discharge Plan and Services Expected Discharge Plan: Home/Self Care In-house Referral: Clinical Social Work, [facility] Discharge Planning Services: CM Consult                                           Social Determinants of Health (SDOH) Interventions    Readmission Risk Interventions No flowsheet data found.

## 2018-11-25 NOTE — Progress Notes (Addendum)
Occupational Therapy Treatment Patient Details Name: Brett Molina MRN: [id number] DOB: 1991/03/02 Today's Date: 11/25/2018    History of present illness Brett Molina is a 28 y.o. male with PHMx: th medical history significant of   hypertension, GERD, chronic alcohol abuse, history of upper GI, and depression presented to the ED on 5/16 in  alcohol intoxication state.   OT comments  Pt with pronounced tremor in bil UEs.  Provided weighted mug and spoon; pt used both along with compensatory strategies.  Stood to brush teeth with min/mod a for balance. Ambulated in hall at end of session with mod +2   Follow Up Recommendations  Supervision/Assistance - 24 hour vs SNF. Would benefit from HHOT:  Medicaid potential   Equipment Recommendations    3:1 commode tub transfer bench   Recommendations for Other Services      Precautions / Restrictions Precautions Precautions: Fall Precaution Comments: monitor HR and BP Restrictions Weight Bearing Restrictions: No       Mobility Bed Mobility Overal bed mobility: Needs Assistance Bed Mobility: Supine to Sit     Supine to sit: Min guard     General bed mobility comments: for safety  Transfers Overall transfer level: Needs assistance Equipment used: Rolling walker (2 wheeled) Transfers: Sit to/from Stand Sit to Stand: From elevated surface;Mod assist;+2 safety/equipment         General transfer comment: Mod assist +2 for steadying, slow eccentric lowering as pt with tendency to sit without control and without reaching for sitting surface. Sit to stand x3, twice from bed, once from recliner.     Balance Overall balance assessment: Needs assistance Sitting-balance support: No upper extremity supported;Feet supported Sitting balance-Leahy Scale: Fair   Postural control: Other (comment)(variable AP leaning) Standing balance support: Bilateral upper extremity supported;During functional activity Standing balance-Leahy  Scale: Poor Standing balance comment: reliant on UE support and PT for steadying                           ADL either performed or assessed with clinical judgement   ADL   Eating/Feeding: Modified independent;Sitting;With adaptive utensils   Grooming: Oral care;Minimal assistance;Standing           Upper Body Dressing : Moderate assistance(eob, increased tremor)                     General ADL Comments: pt with pronounced tremors today.  opened toothpaste sitting with extra time and effort.  Provided weighted mug and weighted spoon and taught several strategies (tucking arms close to body, using LUE under forearm to stabilize, elbow on table ) when self feeding and he tried all of these.  It does take extra time and effort to scoop, but he is able to do this and liked AE.  Min/mod A for balance in standing with one hand on RW during oral care     Vision       Perception     Praxis      Cognition Arousal/Alertness: Awake/alert Behavior During Therapy: Impulsive Overall Cognitive Status: Impaired/Different from baseline Area of Impairment: Safety/judgement;Following commands;Orientation;Attention                 Orientation Level: (A&O x4 today) Current Attention Level: Sustained   Following Commands: Follows one step commands with increased time;Follows one step commands consistently Safety/Judgement: Decreased awareness of safety;Decreased awareness of deficits   Problem Solving: Requires verbal cues General Comments: tends to  move quickly and it takes extra time for him to respond to vc's        Exercises     Shoulder Instructions       General Comments      Pertinent Vitals/ Pain       Pain Assessment: No/denies pain  Home Living                                          Prior Functioning/Environment              Frequency  Min 2X/week        Progress Toward Goals  OT Goals(current goals can now be  found in the care plan section)  Progress towards OT goals: Progressing toward goals(slowly, increased tremor today)  Acute Rehab OT Goals Patient Stated Goal: walk like before  Plan      Co-evaluation    PT/OT/SLP Co-Evaluation/Treatment: Yes Reason for Co-Treatment: For patient/therapist safety PT goals addressed during session: Mobility/safety with mobility OT goals addressed during session: ADL's and self-care      AM-PAC OT "6 Clicks" Daily Activity     Outcome Measure   Help from another person eating meals?: None Help from another person taking care of personal grooming?: A Little Help from another person toileting, which includes using toliet, bedpan, or urinal?: A Lot Help from another person bathing (including washing, rinsing, drying)?: A Lot Help from another person to put on and taking off regular upper body clothing?: A Lot Help from another person to put on and taking off regular lower body clothing?: A Lot 6 Click Score: 15    End of Session    OT Visit Diagnosis: Unsteadiness on feet (R26.81);Other abnormalities of gait and mobility (R26.89);Muscle weakness (generalized) (M62.81);Ataxia, unspecified (R27.0)   Activity Tolerance Patient tolerated treatment well   Patient Left in chair;with call bell/phone within reach;with chair alarm set   Nurse Communication Mobility status        Time: 8309-4076 OT Time Calculation (min): 26 min  Charges: OT General Charges $OT Visit: 1 Visit OT Treatments $Self Care/Home Management : 8-22 mins  Marica Otter, OTR/L Acute Rehabilitation Services [phone] [facility] pager [phone] office 11/25/2018   Brett Molina 11/25/2018, 4:00 PM

## 2018-11-25 NOTE — Progress Notes (Addendum)
Patient received as transfer from ICU.  Agree with previous RN's assessment of patient. Patient alert, oriented, stable at this time. Oriented to unit and equipment. Will continue to monitor.

## 2018-11-25 NOTE — Progress Notes (Signed)
PROGRESS NOTE    Brett Molina  BPZ:[id number] DOB: 1990/10/10 DOA: 11/19/2018 PCP: Patient, No Pcp Per     Brief Narrative:  Brett Molina is a 28 year old male with alcoholism and currently with heavy binge drinking, history of hypertension who presented to the emergency room with intoxication and unable to ambulate without assistance.  In the ER he was intoxicated with alcohol level of 411.  He was also found to be severely dehydrated.  Treated in the emergency room and found to be ataxic to ambulate and with alcohol withdrawal so admitted to the hospital.  He was transferred to stepdown unit overnight on 5/20-5/21 due to increasing CIWA scale and worsening disorientation, hallucinations.  New events last 24 hours / Subjective: This morning, patient is alert, awake, oriented to self, place, year but not to situation.  He believes that he is in the hospital due to "cold symptoms."  Per nursing report, patient has been much more stable overnight and required one-time dose of Ativan overnight for restlessness.  CIWA scale is much improved.  Continues to have tremors.  Assessment & Plan:   Principal Problem:   Alcohol withdrawal (HCC) Active Problems:   Alcoholic hepatitis without ascites   Alcohol abuse   Gastroesophageal reflux disease with esophagitis   Ataxia   HTN (hypertension)   Alcohol intoxication with alcohol withdrawal, alcoholism with ataxia -Fall precautions. Delirium precautions. -Appreciate Neurology. MRI of the brain and C-spine which was suboptimal, however did not show any significant pathology. Thiamine 500 mg IV once a day for 3 days due to concern for Wernicke's encephalopathy.  Now on oral 100 mg daily. -Continue folate -Improved withdrawal symptoms, although continues to have tremors and tachycardia.  Can transfer to MedSurg unit today and continue CIWA scale  Hypertension -Continue hydralazine, labetalol   GERD -Continue Pepcid twice daily    DVT prophylaxis: Subq hep  Code Status: Full Family Communication: None  Disposition Plan: Transfer to MedSurg unit today and continue CIWA scale   Consultants:   Neurology  Procedures:   None   Antimicrobials:  Anti-infectives (From admission, onward)   None       Objective: Vitals:   11/25/18 0600 11/25/18 0700 11/25/18 0800 11/25/18 0900  BP: (!) 153/95 (!) 139/92  (!) 156/93  Pulse: (!) 107 (!) 114 (!) 116 (!) 114  Resp: (!) 22 (!) 23 15 17   Temp:   99 F (37.2 C)   TempSrc:   Oral   SpO2: 97% 99% (!) 89% 99%  Weight:      Height:        Intake/Output Summary (Last 24 hours) at 11/25/2018 1022 Last data filed at 11/25/2018 0941 Gross per 24 hour  Intake 560 ml  Output 1675 ml  Net -1115 ml   Filed Weights   11/22/18 0500 11/23/18 0500 11/24/18 0500  Weight: 95.7 kg 94.4 kg 96.9 kg    Examination: General exam: Appears calm and comfortable, tremors Respiratory system: Clear to auscultation. Respiratory effort normal. Cardiovascular system: S1 & S2 heard, tachycardic, regular rhythm. No JVD, murmurs, rubs, gallops or clicks. No pedal edema. Gastrointestinal system: Abdomen is nondistended, soft and nontender. No organomegaly or masses felt. Normal bowel sounds heard. Central nervous system: Alert and oriented to self, place, year but not to situation. No focal neurological deficits. Extremities: Symmetric 5 x 5 power. Skin: No rashes, lesions or ulcers Psychiatry: Judgement and insight appear stable, improved   Data Reviewed: I have personally reviewed following labs and imaging  studies  CBC: Recent Labs  Lab 11/20/18 0621 11/21/18 0420 11/22/18 0720 11/23/18 0539  WBC 13.9* 9.7 9.7 9.5  NEUTROABS 11.7*  --   --   --   HGB 12.5* 12.8* 12.9* 13.1  HCT 37.1* 37.9* 40.2 39.6  MCV 103.9* 103.8* 104.7* 103.9*  PLT 366 345 306 300   Basic Metabolic Panel: Recent Labs  Lab 11/19/18 1745 11/20/18 0714 11/21/18 0420 11/22/18 0720 11/23/18 0539   NA 143  --  139 141 139  K 3.5  --  3.6 3.6 3.8  CL 107  --  107 105 105  CO2 21*  --  23 25 22   GLUCOSE 99  --  100* 93 85  BUN <5*  --  <5* 7 11  CREATININE 0.67 0.64 0.69 0.78 0.78  CALCIUM 9.2  --  8.9 9.3 9.4  MG  --  1.6*  --   --   --   PHOS  --  3.2  --   --   --    GFR: Estimated Creatinine Clearance: 157 mL/min (by C-G formula based on SCr of 0.78 mg/dL). Liver Function Tests: Recent Labs  Lab 11/19/18 1745  AST 31  ALT 39  ALKPHOS 67  BILITOT 0.3  PROT 7.9  ALBUMIN 4.3   Recent Labs  Lab 11/19/18 1745  LIPASE 29   No results for input(s): AMMONIA in the last 168 hours. Coagulation Profile: Recent Labs  Lab 11/19/18 1745 11/21/18 0420  INR 1.0 1.1   Cardiac Enzymes: Recent Labs  Lab 11/19/18 1744 11/20/18 0714  CKTOTAL 154 143   BNP (last 3 results) No results for input(s): PROBNP in the last 8760 hours. HbA1C: No results for input(s): HGBA1C in the last 72 hours. CBG: Recent Labs  Lab 11/19/18 2141 11/21/18 0741 11/22/18 0727 11/23/18 0712  GLUCAP 81 92 85 76   Lipid Profile: No results for input(s): CHOL, HDL, LDLCALC, TRIG, CHOLHDL, LDLDIRECT in the last 72 hours. Thyroid Function Tests: No results for input(s): TSH, T4TOTAL, FREET4, T3FREE, THYROIDAB in the last 72 hours. Anemia Panel: No results for input(s): VITAMINB12, FOLATE, FERRITIN, TIBC, IRON, RETICCTPCT in the last 72 hours. Sepsis Labs: Recent Labs  Lab 11/19/18 2123 11/19/18 2323 11/20/18 0550 11/20/18 1632  LATICACIDVEN 3.2* 3.0* 3.2* 1.7    Recent Results (from the past 240 hour(s))  SARS Coronavirus 2 (CEPHEID - Performed in [facility] hospital lab), Hosp Order     Status: None   Collection Time: 11/19/18  5:45 PM  Result Value Ref Range Status   SARS Coronavirus 2 NEGATIVE NEGATIVE Final    Comment: (NOTE) If result is NEGATIVE SARS-CoV-2 target nucleic acids are NOT DETECTED. The SARS-CoV-2 RNA is generally detectable in upper and lower  respiratory  specimens during the acute phase of infection. The lowest  concentration of SARS-CoV-2 viral copies this assay can detect is 250  copies / mL. A negative result does not preclude SARS-CoV-2 infection  and should not be used as the sole basis for treatment or other  patient management decisions.  A negative result may occur with  improper specimen collection / handling, submission of specimen other  than nasopharyngeal swab, presence of viral mutation(s) within the  areas targeted by this assay, and inadequate number of viral copies  (<250 copies / mL). A negative result must be combined with clinical  observations, patient history, and epidemiological information. If result is POSITIVE SARS-CoV-2 target nucleic acids are DETECTED. The SARS-CoV-2 RNA is generally detectable  in upper and lower  respiratory specimens dur ing the acute phase of infection.  Positive  results are indicative of active infection with SARS-CoV-2.  Clinical  correlation with patient history and other diagnostic information is  necessary to determine patient infection status.  Positive results do  not rule out bacterial infection or co-infection with other viruses. If result is PRESUMPTIVE POSTIVE SARS-CoV-2 nucleic acids MAY BE PRESENT.   A presumptive positive result was obtained on the submitted specimen  and confirmed on repeat testing.  While 2019 novel coronavirus  (SARS-CoV-2) nucleic acids may be present in the submitted sample  additional confirmatory testing may be necessary for epidemiological  and / or clinical management purposes  to differentiate between  SARS-CoV-2 and other Sarbecovirus currently known to infect humans.  If clinically indicated additional testing with an alternate test  methodology [phone]([id number]) is advised. The SARS-CoV-2 RNA is generally  detectable in upper and lower respiratory sp ecimens during the acute  phase of infection. The expected result is Negative. Fact Sheet for  Patients:  BoilerBrush.com.cyhttps://www.fda.gov/media/136312/download Fact Sheet for Healthcare Providers: https://pope.com/https://www.fda.gov/media/136313/download This test is not yet approved or cleared by the [place] FDA and has been authorized for detection and/or diagnosis of SARS-CoV-2 by FDA under an Emergency Use Authorization (EUA).  This EUA will remain in effect (meaning this test can be used) for the duration of the COVID-19 declaration under Section 564(b)(1) of the Act, 21 U.S.C. section 360bbb-3(b)(1), unless the authorization is terminated or revoked sooner. Performed at [facility], [address] [place], [place] [address]   MRSA PCR Screening     Status: None   Collection Time: 11/24/18  2:00 AM  Result Value Ref Range Status   MRSA by PCR NEGATIVE NEGATIVE Final    Comment:        The GeneXpert MRSA Assay (FDA approved for NASAL specimens only), is one component of a comprehensive MRSA colonization surveillance program. It is not intended to diagnose MRSA infection nor to guide or monitor treatment for MRSA infections. Performed at [facility], [address] [place], [place] [address]        Radiology Studies: No results found.    Scheduled Meds: . Chlorhexidine Gluconate Cloth  6 each Topical Daily  . docusate sodium  100 mg Oral BID  . famotidine  20 mg Oral BID  . feeding supplement (ENSURE ENLIVE)  237 mL Oral TID BM  . feeding supplement (PRO-STAT SUGAR FREE 64)  30 mL Oral BID  . folic acid  1 mg Oral Daily  . heparin  5,000 Units Subcutaneous Q8H  . hydrALAZINE  50 mg Oral TID  . labetalol  300 mg Oral BID  . magnesium oxide  400 mg Oral BID  . mouth rinse  15 mL Mouth Rinse BID  . multivitamin with minerals  1 tablet Oral Daily  . sodium chloride flush  3 mL Intravenous Q12H  . thiamine  100 mg Oral Daily   Continuous Infusions:   LOS: 5 days    Time spent: 25 minutes   Noralee StainJennifer Legacy Carrender, DO [facility]  [email or web address] 11/25/2018, 10:22 AM

## 2018-11-26 ENCOUNTER — Inpatient Hospital Stay (HOSPITAL_COMMUNITY): Payer: Self-pay

## 2018-11-26 LAB — CBC WITH DIFFERENTIAL/PLATELET
Abs Immature Granulocytes: 0.05 10*3/uL (ref 0.00–0.07)
Basophils Absolute: 0 10*3/uL (ref 0.0–0.1)
Basophils Relative: 0 %
Eosinophils Absolute: 0.1 10*3/uL (ref 0.0–0.5)
Eosinophils Relative: 1 %
HCT: 41.8 % (ref 39.0–52.0)
Hemoglobin: 13.7 g/dL (ref 13.0–17.0)
Immature Granulocytes: 1 %
Lymphocytes Relative: 14 %
Lymphs Abs: 1.4 10*3/uL (ref 0.7–4.0)
MCH: 33.9 pg (ref 26.0–34.0)
MCHC: 32.8 g/dL (ref 30.0–36.0)
MCV: 103.5 fL — ABNORMAL HIGH (ref 80.0–100.0)
Monocytes Absolute: 1.4 10*3/uL — ABNORMAL HIGH (ref 0.1–1.0)
Monocytes Relative: 14 %
Neutro Abs: 7.1 10*3/uL (ref 1.7–7.7)
Neutrophils Relative %: 70 %
Platelets: 272 10*3/uL (ref 150–400)
RBC: 4.04 MIL/uL — ABNORMAL LOW (ref 4.22–5.81)
RDW: 12.9 % (ref 11.5–15.5)
WBC: 10.1 10*3/uL (ref 4.0–10.5)
nRBC: 0 % (ref 0.0–0.2)

## 2018-11-26 MED ORDER — LORAZEPAM 1 MG PO TABS
1.0000 mg | ORAL_TABLET | Freq: Four times a day (QID) | ORAL | Status: DC | PRN
  Administered 2018-11-26 – 2018-11-27 (×3): 1 mg via ORAL
  Filled 2018-11-26 (×3): qty 1

## 2018-11-26 MED ORDER — HALOPERIDOL 1 MG PO TABS
2.0000 mg | ORAL_TABLET | Freq: Four times a day (QID) | ORAL | Status: DC | PRN
  Administered 2018-11-26 – 2018-11-27 (×2): 2 mg via ORAL
  Filled 2018-11-26 (×5): qty 1

## 2018-11-26 MED ORDER — FOLIC ACID 1 MG PO TABS
1.0000 mg | ORAL_TABLET | Freq: Every day | ORAL | Status: DC
  Administered 2018-11-27 – 2018-12-07 (×11): 1 mg via ORAL
  Filled 2018-11-26 (×11): qty 1

## 2018-11-26 MED ORDER — THIAMINE HCL 100 MG/ML IJ SOLN: 100.0000 mg | Freq: Every day | INTRAMUSCULAR | Status: DC

## 2018-11-26 MED ORDER — ADULT MULTIVITAMIN W/MINERALS CH
1.0000 | ORAL_TABLET | Freq: Every day | ORAL | Status: DC
  Administered 2018-11-27 – 2018-12-07 (×11): 1 via ORAL
  Filled 2018-11-26 (×11): qty 1

## 2018-11-26 MED ORDER — LORAZEPAM 2 MG/ML IJ SOLN: 1.0000 mg | Freq: Four times a day (QID) | INTRAMUSCULAR | Status: DC | PRN

## 2018-11-26 MED ORDER — HALOPERIDOL LACTATE 5 MG/ML IJ SOLN
2.0000 mg | Freq: Four times a day (QID) | INTRAMUSCULAR | Status: DC | PRN
  Administered 2018-11-27: 01:00:00 2 mg via INTRAVENOUS
  Filled 2018-11-26: qty 1

## 2018-11-26 MED ORDER — LORAZEPAM 2 MG/ML IJ SOLN
1.0000 mg | Freq: Once | INTRAMUSCULAR | Status: AC
  Administered 2018-11-26: 1 mg via INTRAVENOUS
  Filled 2018-11-26: qty 1

## 2018-11-26 NOTE — Progress Notes (Signed)
Pt having visual hallucinations. PRN Ativan 1mg  PO given at 1237 for tremors and anxiety. Hallucinations started later. MD notified, and 1x order 1mg  IV Ativan was given at 1419. Pt in bed holding his wallet very tight, c/o seeing a man on his left side and an older woman on his right side that are both trying to take his wallet away. Pt states "I know it's not real but it's so real in my mind." Will continue to monitor closely.

## 2018-11-26 NOTE — Progress Notes (Signed)
CIWA score now a 22. Will give PO Haldol per MD Alvino Chapel. According to patient, there are now 30 people in his room. Pt restless, lifting his legs up and down in bed. Pt also talking to himself, but says he is not having auditory hallucinations, only visual "like a silent movie." Will continue to monitor closely.

## 2018-11-26 NOTE — Progress Notes (Signed)
PROGRESS NOTE    Brett Molina  ZOX:[id number] DOB: 1991-02-05 DOA: 11/19/2018 PCP: Patient, No Pcp Per     Brief Narrative:  Brett Molina is a 28 year old male with alcoholism and currently with heavy binge drinking, history of hypertension who presented to the emergency room with intoxication and unable to ambulate without assistance.  In the ER he was intoxicated with alcohol level of 411.  He was also found to be severely dehydrated.  Treated in the emergency room and found to be ataxic to ambulate and with alcohol withdrawal so admitted to the hospital.  He was transferred to stepdown unit overnight on 5/20-5/21 due to increasing CIWA scale and worsening disorientation, hallucinations.  New events last 24 hours / Subjective: Patient alert, awake, oriented and he did have a fever 100.4 yesterday afternoon, remains tachycardic today.  No other acute events overnight.  He denies any complaints of chest pain, shortness of breath, cough, nausea, vomiting, abdominal pain or diarrhea.  Assessment & Plan:   Principal Problem:   Alcohol withdrawal (HCC) Active Problems:   Alcoholic hepatitis without ascites   Alcohol abuse   Gastroesophageal reflux disease with esophagitis   Ataxia   HTN (hypertension)   Alcohol intoxication with alcohol withdrawal, alcoholism with ataxia -Fall precautions. Delirium precautions. -Appreciate Neurology. MRI of the brain and C-spine which was suboptimal, however did not show any significant pathology. Thiamine 500 mg IV once a day for 3 days due to concern for Wernicke's encephalopathy.  Now on oral 100 mg daily. -Continue folate -Improved withdrawal symptoms, although continues to have tremors and tachycardia.  Continue CIWA scale  Fever -Unclear etiology of infection.  Blood cultures ordered and pending.  Chest x-ray personally reviewed which did not reveal any acute infectious process.  WBC normal.  Continue to watch fever curve off of  antibiotic  Hypertension -Continue hydralazine, labetalol   GERD -Continue Pepcid twice daily   DVT prophylaxis: Subq hep  Code Status: Full Family Communication: None  Disposition Plan: Monitor fever curve, if remains afebrile next 24 hours, discharge planned for 5/24 with home health   Consultants:   Neurology  Procedures:   None   Antimicrobials:  Anti-infectives (From admission, onward)   None       Objective: Vitals:   11/25/18 2259 11/26/18 0040 11/26/18 0500 11/26/18 0541  BP: (!) 172/115 124/72  (!) 146/86  Pulse: (!) 125 (!) 107  (!) 110  Resp:  20  20  Temp:  99.7 F (37.6 C)  100.3 F (37.9 C)  TempSrc:  Oral  Oral  SpO2: 98% 96%  100%  Weight:   93 kg   Height:        Intake/Output Summary (Last 24 hours) at 11/26/2018 1220 Last data filed at 11/26/2018 0600 Gross per 24 hour  Intake 843 ml  Output 875 ml  Net -32 ml   Filed Weights   11/23/18 0500 11/24/18 0500 11/26/18 0500  Weight: 94.4 kg 96.9 kg 93 kg    Examination: General exam: Appears calm and comfortable, tremors Respiratory system: Clear to auscultation. Respiratory effort normal. Cardiovascular system: S1 & S2 heard, tachycardic, regular rhythm. No JVD, murmurs, rubs, gallops or clicks. No pedal edema. Gastrointestinal system: Abdomen is nondistended, soft and nontender. No organomegaly or masses felt. Normal bowel sounds heard. Central nervous system: Alert and oriented. No focal neurological deficits. Extremities: Symmetric 5 x 5 power. Skin: No rashes, lesions or ulcers Psychiatry: Judgement and insight appear stable   Data Reviewed:  I have personally reviewed following labs and imaging studies  CBC: Recent Labs  Lab 11/20/18 0621 11/21/18 0420 11/22/18 0720 11/23/18 0539 11/26/18 0812  WBC 13.9* 9.7 9.7 9.5 10.1  NEUTROABS 11.7*  --   --   --  7.1  HGB 12.5* 12.8* 12.9* 13.1 13.7  HCT 37.1* 37.9* 40.2 39.6 41.8  MCV 103.9* 103.8* 104.7* 103.9* 103.5*  PLT  366 345 306 300 272   Basic Metabolic Panel: Recent Labs  Lab 11/19/18 1745 11/20/18 0714 11/21/18 0420 11/22/18 0720 11/23/18 0539  NA 143  --  139 141 139  K 3.5  --  3.6 3.6 3.8  CL 107  --  107 105 105  CO2 21*  --  23 25 22   GLUCOSE 99  --  100* 93 85  BUN <5*  --  <5* 7 11  CREATININE 0.67 0.64 0.69 0.78 0.78  CALCIUM 9.2  --  8.9 9.3 9.4  MG  --  1.6*  --   --   --   PHOS  --  3.2  --   --   --    GFR: Estimated Creatinine Clearance: 157 mL/min (by C-G formula based on SCr of 0.78 mg/dL). Liver Function Tests: Recent Labs  Lab 11/19/18 1745  AST 31  ALT 39  ALKPHOS 67  BILITOT 0.3  PROT 7.9  ALBUMIN 4.3   Recent Labs  Lab 11/19/18 1745  LIPASE 29   No results for input(s): AMMONIA in the last 168 hours. Coagulation Profile: Recent Labs  Lab 11/19/18 1745 11/21/18 0420  INR 1.0 1.1   Cardiac Enzymes: Recent Labs  Lab 11/19/18 1744 11/20/18 0714  CKTOTAL 154 143   BNP (last 3 results) No results for input(s): PROBNP in the last 8760 hours. HbA1C: No results for input(s): HGBA1C in the last 72 hours. CBG: Recent Labs  Lab 11/19/18 2141 11/21/18 0741 11/22/18 0727 11/23/18 0712  GLUCAP 81 92 85 76   Lipid Profile: No results for input(s): CHOL, HDL, LDLCALC, TRIG, CHOLHDL, LDLDIRECT in the last 72 hours. Thyroid Function Tests: No results for input(s): TSH, T4TOTAL, FREET4, T3FREE, THYROIDAB in the last 72 hours. Anemia Panel: No results for input(s): VITAMINB12, FOLATE, FERRITIN, TIBC, IRON, RETICCTPCT in the last 72 hours. Sepsis Labs: Recent Labs  Lab 11/19/18 2123 11/19/18 2323 11/20/18 0550 11/20/18 1632  LATICACIDVEN 3.2* 3.0* 3.2* 1.7    Recent Results (from the past 240 hour(s))  SARS Coronavirus 2 (CEPHEID - Performed in [facility] hospital lab), Hosp Order     Status: None   Collection Time: 11/19/18  5:45 PM  Result Value Ref Range Status   SARS Coronavirus 2 NEGATIVE NEGATIVE Final    Comment: (NOTE) If result is  NEGATIVE SARS-CoV-2 target nucleic acids are NOT DETECTED. The SARS-CoV-2 RNA is generally detectable in upper and lower  respiratory specimens during the acute phase of infection. The lowest  concentration of SARS-CoV-2 viral copies this assay can detect is 250  copies / mL. A negative result does not preclude SARS-CoV-2 infection  and should not be used as the sole basis for treatment or other  patient management decisions.  A negative result may occur with  improper specimen collection / handling, submission of specimen other  than nasopharyngeal swab, presence of viral mutation(s) within the  areas targeted by this assay, and inadequate number of viral copies  (<250 copies / mL). A negative result must be combined with clinical  observations, patient history, and epidemiological information.  If result is POSITIVE SARS-CoV-2 target nucleic acids are DETECTED. The SARS-CoV-2 RNA is generally detectable in upper and lower  respiratory specimens dur ing the acute phase of infection.  Positive  results are indicative of active infection with SARS-CoV-2.  Clinical  correlation with patient history and other diagnostic information is  necessary to determine patient infection status.  Positive results do  not rule out bacterial infection or co-infection with other viruses. If result is PRESUMPTIVE POSTIVE SARS-CoV-2 nucleic acids MAY BE PRESENT.   A presumptive positive result was obtained on the submitted specimen  and confirmed on repeat testing.  While 2019 novel coronavirus  (SARS-CoV-2) nucleic acids may be present in the submitted sample  additional confirmatory testing may be necessary for epidemiological  and / or clinical management purposes  to differentiate between  SARS-CoV-2 and other Sarbecovirus currently known to infect humans.  If clinically indicated additional testing with an alternate test  methodology [phone]([id number]) is advised. The SARS-CoV-2 RNA is generally  detectable  in upper and lower respiratory sp ecimens during the acute  phase of infection. The expected result is Negative. Fact Sheet for Patients:  BoilerBrush.com.cyhttps://www.fda.gov/media/136312/download Fact Sheet for Healthcare Providers: https://pope.com/https://www.fda.gov/media/136313/download This test is not yet approved or cleared by the [place] FDA and has been authorized for detection and/or diagnosis of SARS-CoV-2 by FDA under an Emergency Use Authorization (EUA).  This EUA will remain in effect (meaning this test can be used) for the duration of the COVID-19 declaration under Section 564(b)(1) of the Act, 21 U.S.C. section 360bbb-3(b)(1), unless the authorization is terminated or revoked sooner. Performed at [facility], [address] [place], [place] [address]   MRSA PCR Screening     Status: None   Collection Time: 11/24/18  2:00 AM  Result Value Ref Range Status   MRSA by PCR NEGATIVE NEGATIVE Final    Comment:        The GeneXpert MRSA Assay (FDA approved for NASAL specimens only), is one component of a comprehensive MRSA colonization surveillance program. It is not intended to diagnose MRSA infection nor to guide or monitor treatment for MRSA infections. Performed at [facility], [address] [place], [place] [address]        Radiology Studies: Dg Chest Port 1 View  Result Date: 11/26/2018 CLINICAL DATA:  Fever EXAM: PORTABLE CHEST 1 VIEW COMPARISON:  11/19/2018 FINDINGS: The heart size and mediastinal contours are within normal limits. Both lungs are clear. The visualized skeletal structures are unremarkable. IMPRESSION: No active disease. Electronically Signed   By: Marlan Palauharles  Clark M.D.   On: 11/26/2018 08:43      Scheduled Meds: . Chlorhexidine Gluconate Cloth  6 each Topical Daily  . docusate sodium  100 mg Oral BID  . famotidine  20 mg Oral BID  . feeding supplement (ENSURE ENLIVE)  237 mL Oral TID BM  . feeding supplement (PRO-STAT  SUGAR FREE 64)  30 mL Oral BID  . folic acid  1 mg Oral Daily  . heparin  5,000 Units Subcutaneous Q8H  . hydrALAZINE  50 mg Oral TID  . labetalol  300 mg Oral BID  . magnesium oxide  400 mg Oral BID  . mouth rinse  15 mL Mouth Rinse BID  . multivitamin with minerals  1 tablet Oral Daily  . sodium chloride flush  3 mL Intravenous Q12H  . thiamine  100 mg Oral Daily   Continuous Infusions:   LOS: 6 days  Time spent: 25 minutes   Noralee Stain, DO [facility] [email or web address] 11/26/2018, 12:20 PM

## 2018-11-26 NOTE — Progress Notes (Signed)
Pt continues to have worsening hallucinations. States there is a "blue cat" under his mattress and is attempting to lift mattress from bed frame to look under it. Pt very anxious with visible tremors. Will make MD aware.

## 2018-11-27 DIAGNOSIS — F10231 Alcohol dependence with withdrawal delirium: Principal | ICD-10-CM

## 2018-11-27 LAB — URINALYSIS, ROUTINE W REFLEX MICROSCOPIC
Bilirubin Urine: NEGATIVE
Glucose, UA: NEGATIVE mg/dL
Hgb urine dipstick: NEGATIVE
Ketones, ur: 20 mg/dL — AB
Leukocytes,Ua: NEGATIVE
Nitrite: NEGATIVE
Protein, ur: NEGATIVE mg/dL
Specific Gravity, Urine: 1.024 (ref 1.005–1.030)
pH: 6 (ref 5.0–8.0)

## 2018-11-27 MED ORDER — QUETIAPINE FUMARATE 50 MG PO TABS
50.0000 mg | ORAL_TABLET | Freq: Two times a day (BID) | ORAL | Status: DC
  Administered 2018-11-27: 50 mg via ORAL
  Filled 2018-11-27: qty 2

## 2018-11-27 MED ORDER — LORAZEPAM 2 MG/ML IJ SOLN: 2.0000 mg | INTRAMUSCULAR | Status: DC | PRN

## 2018-11-27 MED ORDER — LORAZEPAM 2 MG/ML IJ SOLN
2.0000 mg | Freq: Once | INTRAMUSCULAR | Status: AC
  Administered 2018-11-27: 2 mg via INTRAVENOUS
  Filled 2018-11-27: qty 1

## 2018-11-27 MED ORDER — DEXMEDETOMIDINE HCL IN NACL 200 MCG/50ML IV SOLN
0.2000 ug/kg/h | INTRAVENOUS | Status: DC
  Administered 2018-11-27 (×2): 0.2 ug/kg/h via INTRAVENOUS
  Administered 2018-11-28: 0.4 ug/kg/h via INTRAVENOUS
  Administered 2018-11-28: 0.5 ug/kg/h via INTRAVENOUS
  Filled 2018-11-27 (×4): qty 50

## 2018-11-27 MED ORDER — LORAZEPAM 2 MG/ML IJ SOLN
2.0000 mg | INTRAMUSCULAR | Status: DC | PRN
  Administered 2018-11-27: 3 mg via INTRAVENOUS
  Administered 2018-11-27 – 2018-11-29 (×5): 2 mg via INTRAVENOUS
  Administered 2018-11-30 (×3): 3 mg via INTRAVENOUS
  Administered 2018-11-30 – 2018-12-05 (×2): 2 mg via INTRAVENOUS
  Administered 2018-12-05: 3 mg via INTRAVENOUS
  Filled 2018-11-27 (×2): qty 1
  Filled 2018-11-27 (×3): qty 2
  Filled 2018-11-27: qty 1
  Filled 2018-11-27: qty 2
  Filled 2018-11-27 (×3): qty 1
  Filled 2018-11-27: qty 2
  Filled 2018-11-27: qty 1

## 2018-11-27 MED ORDER — HYDRALAZINE HCL 20 MG/ML IJ SOLN
10.0000 mg | INTRAMUSCULAR | Status: DC | PRN
  Administered 2018-11-29 – 2018-12-05 (×6): 10 mg via INTRAVENOUS
  Filled 2018-11-27 (×7): qty 1

## 2018-11-27 MED ORDER — METOPROLOL TARTRATE 5 MG/5ML IV SOLN
5.0000 mg | INTRAVENOUS | Status: DC | PRN
  Administered 2018-11-27: 5 mg via INTRAVENOUS
  Filled 2018-11-27: qty 5

## 2018-11-27 NOTE — Progress Notes (Signed)
PROGRESS NOTE    Brett Molina  MVE:[id number] DOB: 16-Jun-1991 DOA: 11/19/2018 PCP: Patient, No Pcp Per     Brief Narrative:  Brett Molina is a 28 year old male with alcoholism and currently with heavy binge drinking, history of hypertension who presented to the emergency room with intoxication and unable to ambulate without assistance.  In the ER he was intoxicated with alcohol level of 411.  He was also found to be severely dehydrated.  Treated in the emergency room and found to be ataxic to ambulate and with alcohol withdrawal so admitted to the hospital.  He was transferred to stepdown unit overnight on 5/20-5/21 due to increasing CIWA scale and worsening disorientation, hallucinations.  New events last 24 hours / Subjective: Patient continued to be agitated and hallucinating overnight.  Apparently pulled off his left great toenail today.  Patient has no complaints on examination, gives me a thumbs up sign when asked how he is doing.  States that his brother has been around the hospital (although there has been visitor restrictions currently not allowing any visitors in the hospital).   I spoke with patient's brother over the phone.  Patient does not have any known diagnosis of mental health issues, although I note a prolonged admission in June 2018 that lasted almost 3 weeks.  At that time, he was admitted for alcohol withdrawal required ICU care for Precedex drip.  He was seen by psychiatry due to worsening intermittent hallucinations, was transferred to inpatient psych hospital after discharge.  Spoke with brother who states that patient went home after that hospitalization but has had continued alcohol abuse.  Assessment & Plan:   Principal Problem:   Alcohol withdrawal (HCC) Active Problems:   Alcoholic hepatitis without ascites   Alcohol abuse   Gastroesophageal reflux disease with esophagitis   Ataxia   HTN (hypertension)   Delirium  Alcohol intoxication with  alcohol withdrawal, alcoholism with ataxia -Fall precautions. Delirium precautions. -Appreciate Neurology. MRI of the brain and C-spine which was suboptimal, however did not show any significant pathology. Thiamine 500 mg IV once a day for 3 days due to concern for Wernicke's encephalopathy.  Now on oral 100 mg daily. -Continue folate -Continue CIWA scale -Will add seroquel BID   Fever -Unclear etiology of infection.  Blood cultures ordered and pending.  Chest x-ray personally reviewed which did not reveal any acute infectious process.  WBC normal.  Continue to watch fever curve off of antibiotic. -Check UA and urine culture today   Hypertension -Continue hydralazine, labetalol   GERD -Continue Pepcid twice daily   DVT prophylaxis: Subq hep  Code Status: Full Family Communication: Discussed with brother over the phone  Disposition Plan: Monitor fever curve, UA. May need psych evaluation if patient's delirium does not improve next 24-48 hours    Consultants:   Neurology  Procedures:   None   Antimicrobials:  Anti-infectives (From admission, onward)   None       Objective: Vitals:   11/26/18 2148 11/27/18 0144 11/27/18 0609 11/27/18 1056  BP: (!) 151/93 (!) 151/92 (!) 139/93 109/62  Pulse: (!) 118 (!) 104 (!) 115 (!) 103  Resp: 20 20 18  (!) 21  Temp: 99.8 F (37.7 C) 99.4 F (37.4 C) 99.7 F (37.6 C) (!) 100.4 F (38 C)  TempSrc: Oral Oral Oral Oral  SpO2: 100% 97% 98% 97%  Weight:   92.5 kg   Height:        Intake/Output Summary (Last 24 hours) at 11/27/2018  1212 Last data filed at 11/27/2018 0400 Gross per 24 hour  Intake 1320 ml  Output 1010 ml  Net 310 ml   Filed Weights   11/24/18 0500 11/26/18 0500 11/27/18 0609  Weight: 96.9 kg 93 kg 92.5 kg    Examination: General exam: Appears calm, continues to have tremors  Respiratory system: Clear to auscultation. Respiratory effort normal. Cardiovascular system: S1 & S2 heard, tachycardic, regular  rhythm. No JVD, murmurs, rubs, gallops or clicks. No pedal edema. Gastrointestinal system: Abdomen is nondistended, soft and nontender. No organomegaly or masses felt. Normal bowel sounds heard. Central nervous system: Alert and oriented to self, place, year Extremities: Symmetric 5 x 5 power. Skin: No rashes, lesions or ulcers Psychiatry: Judgement and insight appear poor, continues to have hallucinatory episodes    Data Reviewed: I have personally reviewed following labs and imaging studies  CBC: Recent Labs  Lab 11/21/18 0420 11/22/18 0720 11/23/18 0539 11/26/18 0812  WBC 9.7 9.7 9.5 10.1  NEUTROABS  --   --   --  7.1  HGB 12.8* 12.9* 13.1 13.7  HCT 37.9* 40.2 39.6 41.8  MCV 103.8* 104.7* 103.9* 103.5*  PLT 345 306 300 272   Basic Metabolic Panel: Recent Labs  Lab 11/21/18 0420 11/22/18 0720 11/23/18 0539  NA 139 141 139  K 3.6 3.6 3.8  CL 107 105 105  CO2 23 25 22   GLUCOSE 100* 93 85  BUN <5* 7 11  CREATININE 0.69 0.78 0.78  CALCIUM 8.9 9.3 9.4   GFR: Estimated Creatinine Clearance: 157 mL/min (by C-G formula based on SCr of 0.78 mg/dL). Liver Function Tests: No results for input(s): AST, ALT, ALKPHOS, BILITOT, PROT, ALBUMIN in the last 168 hours. No results for input(s): LIPASE, AMYLASE in the last 168 hours. No results for input(s): AMMONIA in the last 168 hours. Coagulation Profile: Recent Labs  Lab 11/21/18 0420  INR 1.1   Cardiac Enzymes: No results for input(s): CKTOTAL, CKMB, CKMBINDEX, TROPONINI in the last 168 hours. BNP (last 3 results) No results for input(s): PROBNP in the last 8760 hours. HbA1C: No results for input(s): HGBA1C in the last 72 hours. CBG: Recent Labs  Lab 11/21/18 0741 11/22/18 0727 11/23/18 0712  GLUCAP 92 85 76   Lipid Profile: No results for input(s): CHOL, HDL, LDLCALC, TRIG, CHOLHDL, LDLDIRECT in the last 72 hours. Thyroid Function Tests: No results for input(s): TSH, T4TOTAL, FREET4, T3FREE, THYROIDAB in the  last 72 hours. Anemia Panel: No results for input(s): VITAMINB12, FOLATE, FERRITIN, TIBC, IRON, RETICCTPCT in the last 72 hours. Sepsis Labs: Recent Labs  Lab 11/20/18 1632  LATICACIDVEN 1.7    Recent Results (from the past 240 hour(s))  SARS Coronavirus 2 (CEPHEID - Performed in [facility] hospital lab), Hosp Order     Status: None   Collection Time: 11/19/18  5:45 PM  Result Value Ref Range Status   SARS Coronavirus 2 NEGATIVE NEGATIVE Final    Comment: (NOTE) If result is NEGATIVE SARS-CoV-2 target nucleic acids are NOT DETECTED. The SARS-CoV-2 RNA is generally detectable in upper and lower  respiratory specimens during the acute phase of infection. The lowest  concentration of SARS-CoV-2 viral copies this assay can detect is 250  copies / mL. A negative result does not preclude SARS-CoV-2 infection  and should not be used as the sole basis for treatment or other  patient management decisions.  A negative result may occur with  improper specimen collection / handling, submission of specimen other  than nasopharyngeal  swab, presence of viral mutation(s) within the  areas targeted by this assay, and inadequate number of viral copies  (<250 copies / mL). A negative result must be combined with clinical  observations, patient history, and epidemiological information. If result is POSITIVE SARS-CoV-2 target nucleic acids are DETECTED. The SARS-CoV-2 RNA is generally detectable in upper and lower  respiratory specimens dur ing the acute phase of infection.  Positive  results are indicative of active infection with SARS-CoV-2.  Clinical  correlation with patient history and other diagnostic information is  necessary to determine patient infection status.  Positive results do  not rule out bacterial infection or co-infection with other viruses. If result is PRESUMPTIVE POSTIVE SARS-CoV-2 nucleic acids MAY BE PRESENT.   A presumptive positive result was obtained on the submitted  specimen  and confirmed on repeat testing.  While 2019 novel coronavirus  (SARS-CoV-2) nucleic acids may be present in the submitted sample  additional confirmatory testing may be necessary for epidemiological  and / or clinical management purposes  to differentiate between  SARS-CoV-2 and other Sarbecovirus currently known to infect humans.  If clinically indicated additional testing with an alternate test  methodology [phone]) is advised. The SARS-CoV-2 RNA is generally  detectable in upper and lower respiratory sp ecimens during the acute  phase of infection. The expected result is Negative. Fact Sheet for Patients:  BoilerBrush.com.cy Fact Sheet for Healthcare Providers: https://pope.com/ This test is not yet approved or cleared by the [place] FDA and has been authorized for detection and/or diagnosis of SARS-CoV-2 by FDA under an Emergency Use Authorization (EUA).  This EUA will remain in effect (meaning this test can be used) for the duration of the COVID-19 declaration under Section 564(b)(1) of the Act, 21 U.S.C. section 360bbb-3(b)(1), unless the authorization is terminated or revoked sooner. Performed at [facility], [address] [place], [place] [address]   MRSA PCR Screening     Status: None   Collection Time: 11/24/18  2:00 AM  Result Value Ref Range Status   MRSA by PCR NEGATIVE NEGATIVE Final    Comment:        The GeneXpert MRSA Assay (FDA approved for NASAL specimens only), is one component of a comprehensive MRSA colonization surveillance program. It is not intended to diagnose MRSA infection nor to guide or monitor treatment for MRSA infections. Performed at [facility], [address] [place], [place] [address]   Culture, blood (routine x 2)     Status: None (Preliminary result)   Collection Time: 11/26/18  8:09 AM  Result Value Ref Range Status   Specimen  Description   Final    BLOOD RIGHT ANTECUBITAL Performed at [facility], [address], [place], [place] [address]    Special Requests   Final    BOTTLES DRAWN AEROBIC AND ANAEROBIC Blood Culture adequate volume Performed at [facility], [address] [place], [place] [address]    Culture   Final    NO GROWTH 1 DAY Performed at [facility], [address] [place], [place] [address]    Report Status PENDING  Incomplete  Culture, blood (routine x 2)     Status: None (Preliminary result)   Collection Time: 11/26/18  8:11 AM  Result Value Ref Range Status   Specimen Description   Final    BLOOD RIGHT HAND Performed at [facility], [address] [place], [place] [address]  Special Requests   Final    BOTTLES DRAWN AEROBIC ONLY Blood Culture adequate volume Performed at [facility], [address] [place], [place] [address]    Culture   Final    NO GROWTH 1 DAY Performed at [facility], [address] [place], [place] [address]    Report Status PENDING  Incomplete       Radiology Studies: Dg Chest Port 1 View  Result Date: 11/26/2018 CLINICAL DATA:  Fever EXAM: PORTABLE CHEST 1 VIEW COMPARISON:  11/19/2018 FINDINGS: The heart size and mediastinal contours are within normal limits. Both lungs are clear. The visualized skeletal structures are unremarkable. IMPRESSION: No active disease. Electronically Signed   By: Marlan Palauharles  Clark M.D.   On: 11/26/2018 08:43      Scheduled Meds: . Chlorhexidine Gluconate Cloth  6 each Topical Daily  . docusate sodium  100 mg Oral BID  . famotidine  20 mg Oral BID  . feeding supplement (ENSURE ENLIVE)  237 mL Oral TID BM  . feeding supplement (PRO-STAT SUGAR FREE 64)  30 mL Oral BID  . folic acid  1 mg Oral Daily  . heparin  5,000 Units Subcutaneous Q8H  . hydrALAZINE  50 mg Oral TID  . labetalol  300 mg Oral BID  . magnesium oxide  400 mg  Oral BID  . mouth rinse  15 mL Mouth Rinse BID  . multivitamin with minerals  1 tablet Oral Daily  . sodium chloride flush  3 mL Intravenous Q12H  . thiamine  100 mg Oral Daily   Continuous Infusions:   LOS: 7 days    Time spent: 25 minutes   Noralee StainJennifer Samel Bruna, DO [facility] [email or web address] 11/27/2018, 12:12 PM

## 2018-11-27 NOTE — Progress Notes (Signed)
Physical Therapy Treatment Patient Details Name: Brett Molina MRN: [id number] DOB: Nov 21, 1990 Today's Date: 11/27/2018    History of Present Illness Brett Molina is a 28 y.o. male with PHMx: th medical history significant of   hypertension, GERD, chronic alcohol abuse, history of upper GI, and depression presented to the ED on 5/16 in  alcohol intoxication state.    PT Comments    Pt very cooperative but ltd this am by ongoing hallucinations and dizziness with move to standing - BP 85/49 - RN aware.   Follow Up Recommendations  Supervision/Assistance - 24 hour;SNF     Equipment Recommendations  Other (comment)    Recommendations for Other Services       Precautions / Restrictions Precautions Precautions: Fall Precaution Comments: monitor HR and BP Restrictions Weight Bearing Restrictions: No    Mobility  Bed Mobility Overal bed mobility: Needs Assistance Bed Mobility: Supine to Sit;Sit to Supine     Supine to sit: Min assist;+2 for physical assistance;+2 for safety/equipment Sit to supine: Supervision   General bed mobility comments: Min assist to initiate move to sitting - pt with difficulty following cues  Transfers Overall transfer level: Needs assistance Equipment used: Rolling walker (2 wheeled) Transfers: Sit to/from Stand Sit to Stand: From elevated surface;+2 safety/equipment;Min assist         General transfer comment: Min assist +2 for steadying, Mod assist +2 for slow eccentric lowering as pt with tendency to sit without control and without reaching for sitting surface. Sit to stand x2  Ambulation/Gait Ambulation/Gait assistance: Min assist;+2 safety/equipment Gait Distance (Feet): 4 Feet Assistive device: Rolling walker (2 wheeled) Gait Pattern/deviations: Step-to pattern;Decreased step length - right;Decreased step length - left;Shuffle Gait velocity: decreased   General Gait Details: Pt side stepped up side of bed only 2* c/o  dizziness with standing.  Pt very unsteady requiring assist for balance/support and RW management.   Stairs             Wheelchair Mobility    Modified Rankin (Stroke Patients Only)       Balance Overall balance assessment: Needs assistance Sitting-balance support: No upper extremity supported;Feet supported Sitting balance-Leahy Scale: Fair     Standing balance support: Bilateral upper extremity supported;During functional activity Standing balance-Leahy Scale: Poor Standing balance comment: reliant on UE support and PT for steadying                            Cognition Arousal/Alertness: Awake/alert Behavior During Therapy: Impulsive Overall Cognitive Status: Impaired/Different from baseline Area of Impairment: Safety/judgement;Following commands;Orientation;Attention                 Orientation Level: Disoriented to;Place;Time;Situation Current Attention Level: Sustained Memory: Decreased short-term memory Following Commands: Follows one step commands with increased time;Follows one step commands consistently Safety/Judgement: Decreased awareness of safety;Decreased awareness of deficits   Problem Solving: Requires verbal cues General Comments: tends to move quickly and it takes extra time for him to respond to vc's      Exercises      General Comments        Pertinent Vitals/Pain Pain Assessment: No/denies pain    Home Living                      Prior Function            PT Goals (current goals can now be found in the care plan section) Acute Rehab  PT Goals Patient Stated Goal: walk like before PT Goal Formulation: With patient Time For Goal Achievement: 12/05/18 Potential to Achieve Goals: Fair Progress towards PT goals: Not progressing toward goals - comment(orthostatic)    Frequency    Min 3X/week      PT Plan Current plan remains appropriate    Co-evaluation              AM-PAC PT "6 Clicks"  Mobility   Outcome Measure  Help needed turning from your back to your side while in a flat bed without using bedrails?: A Little Help needed moving from lying on your back to sitting on the side of a flat bed without using bedrails?: A Little Help needed moving to and from a bed to a chair (including a wheelchair)?: A Lot Help needed standing up from a chair using your arms (e.g., wheelchair or bedside chair)?: A Little Help needed to walk in hospital room?: A Little Help needed climbing 3-5 steps with a railing? : A Lot 6 Click Score: 16    End of Session Equipment Utilized During Treatment: Gait belt Activity Tolerance: Patient limited by fatigue Patient left: in bed;with call bell/phone within reach;with bed alarm set Nurse Communication: Mobility status PT Visit Diagnosis: Ataxic gait (R26.0);Other abnormalities of gait and mobility (R26.89)     Time: 1135-1200 PT Time Calculation (min) (ACUTE ONLY): 25 min  Charges:  $Gait Training: 8-22 mins $Therapeutic Activity: 8-22 mins                     Mauro Kaufmann PT Acute Rehabilitation Services Pager [phone] Office [phone]    Deitrick Ferreri 11/27/2018, 1:05 PM

## 2018-11-27 NOTE — Progress Notes (Addendum)
  PROGRESS NOTE  Called by RN this afternoon, his hallucinations have gotten worse, CIWA >30. He was given 1mg  Ativan around noon, haldol, and then 2mg  Ativan prior to transfer to stepdown unit. On my exam, he has visible tremors, not oriented to place or year. Obvious decline from my previous exam. At this point, we need to try precedex. Also ordered sitter at bedside.   On review of his admission in June 2018, his prolonged hospital course was as follows: 6/9 admit 6/11 on precedex 6/15 off precedex and out of ICU 6/17 back on precedex 6/19 off precedex and out of ICU  6/20 intermittent hallucinations and delirium  6/24 discharge to inpatient psych facility  Will leave on precedex overnight. Suspect he will need psych consultation once medically stable, wonder if he has underlying psych dx as well which may be exacerbated by alcohol withdrawal. Discussed over the phone with PCCM; I think we can manage this on [facility] service for now. If clinical change or decline, will formally consult them.   Total critical care time: 22 minutes. Time 5:01pm-5:15pm, 5:20pm-5:28pm Critical care time was exclusive of separately billable procedures and treating other patients. Critical care was necessary to treat or prevent imminent or life-threatening deterioration. Critical care was time spent personally by me on the following activities: development of treatment plan with patient and/or surrogate as well as nursing, discussions with consultants, evaluation of patient's response to treatment, examination of patient, obtaining history from patient or surrogate, ordering and performing treatments and interventions, ordering and review of laboratory studies, ordering and review of radiographic studies, pulse oximetry and re-evaluation of patient's condition.   Noralee Stain, DO [facility] [email or web address] 11/27/2018, 5:24 PM    Discussed with PCCM again, will trial higher dose of Ativan instead of Precedex  gtt at this time. Ordered stepdown protocol for Ativan, 2-3mg  q1h. Updated RN. Will signout to night team.   Noralee Stain, DO [facility] [email or web address] 11/27/2018, 5:54 PM

## 2018-11-27 NOTE — Progress Notes (Signed)
Pt continues to have severe hallucinations. Pt is seen talking to himself in his room and constantly looks over his shoulder and laughs when RN enters room. Pt seems to reorient after RN explains that no other people are in his room (besides RN and patient), but then continues to show signs of paranoia and constantly looks over his headboard to see "if he's still there." Pt refuses to allow RN to dress left great toenail after he appears to have ripped it off last night. At this time, patient is lifting his legs in the air while laying in bed. When asked why, pt replies "I'm just trying to figure out how to land easier." Pt states "I'm just hanging here like a chandelier." Pt reoriented that he is lying in bed but states "Physically I am. But mentally I'm a chandelier."   Pt also states his mom died in 2018/01/11and that is when his drinking got "out of control." Per patient, he has actually been drinking up to 1 liter of vodka or gin a day since October 13, 2018. Unsure how factual this is d/t hallucinations and AMS. Also, pt's BP dropped to 85/49 upon standing with PT. Pt did become dizzy and symptomatic. MD Alvino Chapel made aware of all new findings.

## 2018-11-27 NOTE — Progress Notes (Signed)
CIWA now a 39. Pt having severe hallucinations, only oriented to self. Pt becoming increasingly paranoid. MD Alvino Chapel paged and transfer ordered. Report called to RN Morrie Sheldon in ICU.

## 2018-11-27 NOTE — Progress Notes (Signed)
In to assess patient and do bedside report at 1930, Pt having extreme tremors, nonverbal, unable to get BP d/t tremors.  I was advised in report that Dr. Alvino Chapel has talked with PCCM and PCCM recommended to hold the Precedex gtt and give Ativan IV pushes.  Precedex was not infusing at this time.  I gave patient Ativan 2mg  at 2014, for a CIWA of 19, but unsure CIWA reflects how extreme his tremors are.  E-link, RN was doing her rounds and video'd into the room.  I advised her of the situation and she also thought Precedex should be started.  Paged [facility] NP, advised him of the situation.  Precedex is still an active order, he advised to restart the Precedex gtt, which was done.  I also advised the NP due to his altered LOC, that I didn't think it was a good idea to give anything PO, including meds.  He advised that I could hold the PO meds for tonight and they can reassess in the morning.  Franki Cabot ICU/SD RN4 / Rapid Response Nurse Rapid Response Number:  [phone]

## 2018-11-28 LAB — URINE CULTURE: Culture: NO GROWTH

## 2018-11-28 MED ORDER — SODIUM CHLORIDE 0.9 % IV BOLUS
500.0000 mL | Freq: Once | INTRAVENOUS | Status: AC
  Administered 2018-11-28: 500 mL via INTRAVENOUS

## 2018-11-28 NOTE — Progress Notes (Signed)
MD paged.. Bladder scanned patient showed 420 cc. BP 84/30 MAP 48.   Precedex turned off.   New orders for I&O.  Patient requested to attempt voiding before invasive procedure. Patient successful using bedside urinal.

## 2018-11-28 NOTE — Progress Notes (Signed)
PROGRESS NOTE    La Cabreros Schaaf  LOV:[id number] DOB: [birth year] DOA: 11/19/2018 PCP: Patient, No Pcp Per     Brief Narrative:  Ramon Dredge Resende is a 28 year old male with alcoholism and currently with heavy binge drinking, history of hypertension who presented to the emergency room with intoxication and unable to ambulate without assistance.  In the ER he was intoxicated with alcohol level of 411.  He was also found to be severely dehydrated.  Treated in the emergency room and found to be ataxic to ambulate and with alcohol withdrawal so admitted to the hospital.  He was transferred to stepdown unit overnight on 5/20-5/21 due to increasing CIWA scale and worsening disorientation, hallucinations. Overnight 5/24-5/25, he continued to worsen, was moved to ICU for precedex drip.   On review of his admission in June 2018, his prolonged hospital course was as follows: 6/9 admit 6/11 on precedex 6/15 off precedex and out of ICU 6/17 back on precedex 6/19 off precedex and out of ICU  6/20 intermittent hallucinations and delirium  6/24 discharge to inpatient psych facility  New events last 24 hours / Subjective: Sitter at bedside, patient much more alert and oriented this morning. Remains on precedex gtt. Oriented to self, place, year.   Assessment & Plan:   Principal Problem:   Alcohol withdrawal (HCC) Active Problems:   Alcoholic hepatitis without ascites   Alcohol abuse   Gastroesophageal reflux disease with esophagitis   Ataxia   HTN (hypertension)   Delirium  Alcohol intoxication with alcohol withdrawal, alcoholism with ataxia -Fall precautions. Delirium precautions. -Appreciate Neurology. MRI of the brain and C-spine which was suboptimal, however did not show any significant pathology. Thiamine 500 mg IV once a day for 3 days due to concern for Wernicke's encephalopathy.  Now on oral 100 mg daily. -Continue folate -Seroquel BID  -Precedex gtt   Fever -Unclear etiology  of infection.  Blood cultures negative to date.  Chest x-ray personally reviewed which did not reveal any acute infectious process.  WBC normal.  Continue to watch fever curve off of antibiotic. UA unremarkable for infection. Urine culture pending   Hypertension -Continue hydralazine, labetalol   GERD -Continue Pepcid twice daily   DVT prophylaxis: Subq hep  Code Status: Full Family Communication: Discussed with brother over the phone 5/24  Disposition Plan: ICU for precedex drip. Remains on TRH service     Consultants:   Neurology  PCCM over the phone   Procedures:   None   Antimicrobials:  Anti-infectives (From admission, onward)   None       Objective: Vitals:   11/28/18 0600 11/28/18 0700 11/28/18 0719 11/28/18 0800  BP: (!) 107/52 (!) 92/43  (!) 95/55  Pulse: 90 89  91  Resp: (!) 22 20  (!) 21  Temp:   98.5 F (36.9 C)   TempSrc:   Oral   SpO2: 100%   100%  Weight:      Height:        Intake/Output Summary (Last 24 hours) at 11/28/2018 0842 Last data filed at 11/28/2018 0650 Gross per 24 hour  Intake 257.45 ml  Output 800 ml  Net -542.55 ml   Filed Weights   11/27/18 0609 11/27/18 1700 11/28/18 0500  Weight: 92.5 kg 92.2 kg 93.3 kg    Examination: General exam: Appears calm and comfortable, mildly diaphoretic  Respiratory system: Clear to auscultation. Respiratory effort normal. Cardiovascular system: S1 & S2 heard, RRR. No JVD, murmurs, rubs, gallops or clicks. No  pedal edema. Gastrointestinal system: Abdomen is nondistended, soft and nontender. No organomegaly or masses felt. Normal bowel sounds heard. Central nervous system: Alert and oriented. No focal neurological deficits. Extremities: Symmetric 5 x 5 power. Skin: No rashes, lesions or ulcers Psychiatry: Judgement and insight appear stable, improved    Data Reviewed: I have personally reviewed following labs and imaging studies  CBC: Recent Labs  Lab 11/22/18 0720 11/23/18 0539  11/26/18 0812  WBC 9.7 9.5 10.1  NEUTROABS  --   --  7.1  HGB 12.9* 13.1 13.7  HCT 40.2 39.6 41.8  MCV 104.7* 103.9* 103.5*  PLT 306 300 272   Basic Metabolic Panel: Recent Labs  Lab 11/22/18 0720 11/23/18 0539  NA 141 139  K 3.6 3.8  CL 105 105  CO2 25 22  GLUCOSE 93 85  BUN 7 11  CREATININE 0.78 0.78  CALCIUM 9.3 9.4   GFR: Estimated Creatinine Clearance: 157 mL/min (by C-G formula based on SCr of 0.78 mg/dL). Liver Function Tests: No results for input(s): AST, ALT, ALKPHOS, BILITOT, PROT, ALBUMIN in the last 168 hours. No results for input(s): LIPASE, AMYLASE in the last 168 hours. No results for input(s): AMMONIA in the last 168 hours. Coagulation Profile: No results for input(s): INR, PROTIME in the last 168 hours. Cardiac Enzymes: No results for input(s): CKTOTAL, CKMB, CKMBINDEX, TROPONINI in the last 168 hours. BNP (last 3 results) No results for input(s): PROBNP in the last 8760 hours. HbA1C: No results for input(s): HGBA1C in the last 72 hours. CBG: Recent Labs  Lab 11/22/18 0727 11/23/18 0712  GLUCAP 85 76   Lipid Profile: No results for input(s): CHOL, HDL, LDLCALC, TRIG, CHOLHDL, LDLDIRECT in the last 72 hours. Thyroid Function Tests: No results for input(s): TSH, T4TOTAL, FREET4, T3FREE, THYROIDAB in the last 72 hours. Anemia Panel: No results for input(s): VITAMINB12, FOLATE, FERRITIN, TIBC, IRON, RETICCTPCT in the last 72 hours. Sepsis Labs: No results for input(s): PROCALCITON, LATICACIDVEN in the last 168 hours.  Recent Results (from the past 240 hour(s))  SARS Coronavirus 2 (CEPHEID - Performed in [facility] hospital lab), Hosp Order     Status: None   Collection Time: 11/19/18  5:45 PM  Result Value Ref Range Status   SARS Coronavirus 2 NEGATIVE NEGATIVE Final    Comment: (NOTE) If result is NEGATIVE SARS-CoV-2 target nucleic acids are NOT DETECTED. The SARS-CoV-2 RNA is generally detectable in upper and lower  respiratory specimens  during the acute phase of infection. The lowest  concentration of SARS-CoV-2 viral copies this assay can detect is 250  copies / mL. A negative result does not preclude SARS-CoV-2 infection  and should not be used as the sole basis for treatment or other  patient management decisions.  A negative result may occur with  improper specimen collection / handling, submission of specimen other  than nasopharyngeal swab, presence of viral mutation(s) within the  areas targeted by this assay, and inadequate number of viral copies  (<250 copies / mL). A negative result must be combined with clinical  observations, patient history, and epidemiological information. If result is POSITIVE SARS-CoV-2 target nucleic acids are DETECTED. The SARS-CoV-2 RNA is generally detectable in upper and lower  respiratory specimens dur ing the acute phase of infection.  Positive  results are indicative of active infection with SARS-CoV-2.  Clinical  correlation with patient history and other diagnostic information is  necessary to determine patient infection status.  Positive results do  not rule out bacterial  infection or co-infection with other viruses. If result is PRESUMPTIVE POSTIVE SARS-CoV-2 nucleic acids MAY BE PRESENT.   A presumptive positive result was obtained on the submitted specimen  and confirmed on repeat testing.  While 2019 novel coronavirus  (SARS-CoV-2) nucleic acids may be present in the submitted sample  additional confirmatory testing may be necessary for epidemiological  and / or clinical management purposes  to differentiate between  SARS-CoV-2 and other Sarbecovirus currently known to infect humans.  If clinically indicated additional testing with an alternate test  methodology [phone]) is advised. The SARS-CoV-2 RNA is generally  detectable in upper and lower respiratory sp ecimens during the acute  phase of infection. The expected result is Negative. Fact Sheet for Patients:   BoilerBrush.com.cy Fact Sheet for Healthcare Providers: https://pope.com/ This test is not yet approved or cleared by the [place] FDA and has been authorized for detection and/or diagnosis of SARS-CoV-2 by FDA under an Emergency Use Authorization (EUA).  This EUA will remain in effect (meaning this test can be used) for the duration of the COVID-19 declaration under Section 564(b)(1) of the Act, 21 U.S.C. section 360bbb-3(b)(1), unless the authorization is terminated or revoked sooner. Performed at [facility], [address] [place], [place] [address]   MRSA PCR Screening     Status: None   Collection Time: 11/24/18  2:00 AM  Result Value Ref Range Status   MRSA by PCR NEGATIVE NEGATIVE Final    Comment:        The GeneXpert MRSA Assay (FDA approved for NASAL specimens only), is one component of a comprehensive MRSA colonization surveillance program. It is not intended to diagnose MRSA infection nor to guide or monitor treatment for MRSA infections. Performed at [facility], [address], [place], [place] [address]   Culture, blood (routine x 2)     Status: None (Preliminary result)   Collection Time: 11/26/18  8:09 AM  Result Value Ref Range Status   Specimen Description   Final    BLOOD RIGHT ANTECUBITAL Performed at [facility], [address], [place], [place] [address]    Special Requests   Final    BOTTLES DRAWN AEROBIC AND ANAEROBIC Blood Culture adequate volume Performed at [facility], [address], [place], [place] [address]    Culture   Final    NO GROWTH 2 DAYS Performed at [facility], [address] [place], [place] [address]    Report Status PENDING  Incomplete  Culture, blood (routine x 2)     Status: None (Preliminary result)   Collection Time: 11/26/18  8:11 AM  Result Value Ref Range Status   Specimen  Description   Final    BLOOD RIGHT HAND Performed at [facility], [address] [place], [place] [address]    Special Requests   Final    BOTTLES DRAWN AEROBIC ONLY Blood Culture adequate volume Performed at [facility], [address] [place], [place] [address]    Culture   Final    NO GROWTH 2 DAYS Performed at [facility], [address] [place], [place] [address]    Report Status PENDING  Incomplete       Radiology Studies: No results found.    Scheduled Meds: . Chlorhexidine Gluconate Cloth  6 each Topical Daily  . docusate sodium  100 mg Oral BID  . famotidine  20 mg Oral BID  .  feeding supplement (ENSURE ENLIVE)  237 mL Oral TID BM  . feeding supplement (PRO-STAT SUGAR FREE 64)  30 mL Oral BID  . folic acid  1 mg Oral Daily  . heparin  5,000 Units Subcutaneous Q8H  . hydrALAZINE  50 mg Oral TID  . labetalol  300 mg Oral BID  . magnesium oxide  400 mg Oral BID  . mouth rinse  15 mL Mouth Rinse BID  . multivitamin with minerals  1 tablet Oral Daily  . sodium chloride flush  3 mL Intravenous Q12H  . thiamine  100 mg Oral Daily   Continuous Infusions: . dexmedetomidine (PRECEDEX) IV infusion 0.3 mcg/kg/hr (11/28/18 0650)     LOS: 8 days    Time spent: 25 minutes   Noralee StainJennifer Alfonsa Vaile, DO [facility] [email or web address] 11/28/2018, 8:42 AM

## 2018-11-28 NOTE — Progress Notes (Signed)
Pt asked for his phone charger cord he thought it was left in the bed that he was transferred from last night.  RN found white charge block but no cord.  4th floor could not find his I phone cord.  Secretary left a message with the gift shop to deliver a donated cord in the morning.  RN provided the ICU charge cord to use for charging his phone temporarily tonight.  Pt was very grateful.  Phone charging.

## 2018-11-29 DIAGNOSIS — F1020 Alcohol dependence, uncomplicated: Secondary | ICD-10-CM

## 2018-11-29 MED ORDER — HYDRALAZINE HCL 25 MG PO TABS
25.0000 mg | ORAL_TABLET | Freq: Three times a day (TID) | ORAL | Status: DC
  Administered 2018-11-29 – 2018-12-04 (×18): 25 mg via ORAL
  Filled 2018-11-29 (×18): qty 1

## 2018-11-29 MED ORDER — QUETIAPINE FUMARATE 25 MG PO TABS
50.0000 mg | ORAL_TABLET | Freq: Two times a day (BID) | ORAL | Status: DC | PRN
  Administered 2018-11-30 – 2018-12-04 (×2): 50 mg via ORAL
  Filled 2018-11-29: qty 2
  Filled 2018-11-29: qty 1

## 2018-11-29 MED ORDER — GABAPENTIN 100 MG PO CAPS
100.0000 mg | ORAL_CAPSULE | Freq: Three times a day (TID) | ORAL | Status: DC
  Administered 2018-11-29 – 2018-11-30 (×3): 100 mg via ORAL
  Filled 2018-11-29 (×3): qty 1

## 2018-11-29 MED ORDER — LABETALOL HCL 200 MG PO TABS
300.0000 mg | ORAL_TABLET | Freq: Three times a day (TID) | ORAL | Status: DC
  Administered 2018-11-29 – 2018-12-01 (×9): 300 mg via ORAL
  Filled 2018-11-29 (×10): qty 1

## 2018-11-29 MED ORDER — QUETIAPINE FUMARATE 50 MG PO TABS
50.0000 mg | ORAL_TABLET | Freq: Two times a day (BID) | ORAL | Status: DC
  Administered 2018-11-29: 50 mg via ORAL
  Filled 2018-11-29: qty 1

## 2018-11-29 NOTE — Consult Note (Addendum)
Telepsych Consultation   Reason for Consult:  Hallucinations  Referring Physician:  [facility] Location of Patient: Dr. Noralee Stain Location of Provider: [facility]  Patient Identification: Brett Molina MRN:  [id number] Principal Diagnosis: Alcohol use disorder, severe, dependence (HCC) Diagnosis:  Principal Problem:   Alcohol withdrawal (HCC) Active Problems:   Alcoholic hepatitis without ascites   Alcohol abuse   Gastroesophageal reflux disease with esophagitis   Ataxia   HTN (hypertension)   Total Time spent with patient: 1 hour  Subjective:   Brett Molina is a 28 y.o. male patient admitted with alcohol withdrawal.  HPI:   Per chart review, patient was admitted with alcohol withdrawal on 5/17. BAL was 411 on admission. His hospital course has been complicated by intermittent disorientation and hallucinations. He was transferred from the stepdown unit to the ICU due to requiring Precedex. Precedex was discontinued yesterday. He has received 4 mg of Ativan in the past 24 hours. He had a similar presentation in 12/2016 where he had a prolonged hospitalization for alcohol withdrawal. He was initially admitted to [facility] but was discharged for medical admission due to severe alcohol withdrawal requiring Precedex. He was started on Seroquel 50 mg TID. He was given outpatient substance abuse resources.   On interview, Brett Molina reports a history of alcohol abuse. He has been abusing alcohol for 5 years. He reports drinking one fifth of hard liquor or wine daily. He mostly drinks wine. He denies a history of seizures related to alcohol withdrawal. He completed rehab in August 2018. His longest period of sobriety was for 9 months. He was able to maintain sobriety by keeping himself busy. 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 He denies illicit substance use. He reports mild depression and anxiety. He took an  antidepressant once in 2018-01-03after his mother passed away from complications related to the flu. He reports, "I did not like the way it made him feel." He reports that his family lives in [place] where he was raised. He has an older brother who lives in [place] and they have a good relationship. He denies SI, HI or AVH. He does admit to prior hallucinations related to alcohol withdrawal. He last remembers hallucinating on Friday. He denies a history of suicide attempts. He denies a history of manic symptoms (decreased need for sleep, increased energy, pressured speech or euphoria).    Past Psychiatric History: Alcohol abuse and MDD  Risk to Self: Suicidal Ideation: No Suicidal Intent: No Is patient at risk for suicide?: No Suicidal Plan?: No Access to Means: No What has been your use of drugs/alcohol within the last 12 months?: (1.5 liters of wine daily) How many times?: (0) Other Self Harm Risks: (n/a) Triggers for Past Attempts: None known Intentional Self Injurious Behavior: None Risk to Others: Homicidal Ideation: No Thoughts of Harm to Others: No Current Homicidal Intent: No Current Homicidal Plan: No Access to Homicidal Means: No Identified Victim: (n/a) History of harm to others?: No Assessment of Violence: None Noted Violent Behavior Description: (n/a) Does patient have access to weapons?: No Criminal Charges Pending?: No Does patient have a court date: No Prior Inpatient Therapy: Prior Inpatient Therapy: No Prior Outpatient Therapy: Prior Outpatient Therapy: No Does patient have an ACCT team?: No Does patient have Intensive In-House Services?  : No Does patient have [facility] services? : No Does patient have P4CC services?: No  Past Medical History:  Past Medical  History:  Diagnosis Date  . ETOH abuse   . Hypertension     Past Surgical History:  Procedure Laterality Date  . ESOPHAGOGASTRODUODENOSCOPY N/A 07/24/2018   Procedure:  ESOPHAGOGASTRODUODENOSCOPY (EGD);  Surgeon: Rachael Fee, MD;  Location: [facility];  Service: Endoscopy;  Laterality: N/A;   Family History: History reviewed. No pertinent family history. Family Psychiatric  History: Maternal and paternal grandparents-alcoholism.  Social History:  Social History   Substance and Sexual Activity  Alcohol Use Yes  . Alcohol/week: 14.0 standard drinks  . Types: 14 Standard drinks or equivalent per week   Comment: 1/2 gallon of wine PTA     Social History   Substance and Sexual Activity  Drug Use No    Social History   Socioeconomic History  . Marital status: Single    Spouse name: Not on file  . Number of children: Not on file  . Years of education: Not on file  . Highest education level: Not on file  Occupational History  . Not on file  Social Needs  . Financial resource strain: Not on file  . Food insecurity:    Worry: Not on file    Inability: Not on file  . Transportation needs:    Medical: Not on file    Non-medical: Not on file  Tobacco Use  . Smoking status: Never Smoker  . Smokeless tobacco: Never Used  Substance and Sexual Activity  . Alcohol use: Yes    Alcohol/week: 14.0 standard drinks    Types: 14 Standard drinks or equivalent per week    Comment: 1/2 gallon of wine PTA  . Drug use: No  . Sexual activity: Not on file  Lifestyle  . Physical activity:    Days per week: Not on file    Minutes per session: Not on file  . Stress: Not on file  Relationships  . Social connections:    Talks on phone: Not on file    Gets together: Not on file    Attends religious service: Not on file    Active member of club or organization: Not on file    Attends meetings of clubs or organizations: Not on file    Relationship status: Not on file  Other Topics Concern  . Not on file  Social History Narrative  . Not on file   Additional Social History: He is from [place], [place]. He has been living in [place] since  May 2012. He lives with his girlfriend. They have been dating for a year. He does not have children.  He reports a history of heavy alcohol use. He has a history of one DUI. He denies illicit substance use.     Allergies:  No Known Allergies  Labs: No results found for this or any previous visit (from the past 48 hour(s)).  Medications:  Current Facility-Administered Medications  Medication Dose Route Frequency Provider Last Rate Last Dose  . acetaminophen (TYLENOL) tablet 650 mg  650 mg Oral Q6H PRN Nevin Bloodgood A, MD   650 mg at 11/20/18 2011   Or  . acetaminophen (TYLENOL) suppository 650 mg  650 mg Rectal Q6H PRN Shahmehdi, Seyed A, MD      . albuterol (PROVENTIL) (2.5 MG/3ML) 0.083% nebulizer solution 2.5 mg  2.5 mg Nebulization Q2H PRN Shahmehdi, Seyed A, MD      . Chlorhexidine Gluconate Cloth 2 % PADS 6 each  6 each Topical Daily Noralee Stain, DO   6 each at 11/29/18 1030  .  docusate sodium (COLACE) capsule 100 mg  100 mg Oral BID Shahmehdi, Seyed A, MD   100 mg at 11/28/18 2203  . famotidine (PEPCID) tablet 20 mg  20 mg Oral BID Antony Madura, PA-C   20 mg at 11/29/18 0981  . feeding supplement (ENSURE ENLIVE) (ENSURE ENLIVE) liquid 237 mL  237 mL Oral TID BM Noralee Stain, DO   237 mL at 11/29/18 0909  . feeding supplement (PRO-STAT SUGAR FREE 64) liquid 30 mL  30 mL Oral BID Dorcas Carrow, MD   30 mL at 11/28/18 1456  . folic acid (FOLVITE) tablet 1 mg  1 mg Oral Daily Noralee Stain, DO   1 mg at 11/29/18 0911  . heparin injection 5,000 Units  5,000 Units Subcutaneous Q8H Nevin Bloodgood A, MD   5,000 Units at 11/29/18 0602  . hydrALAZINE (APRESOLINE) injection 10 mg  10 mg Intravenous Q4H PRN Noralee Stain, DO   10 mg at 11/29/18 1914  . hydrALAZINE (APRESOLINE) tablet 25 mg  25 mg Oral TID Noralee Stain, DO   25 mg at 11/29/18 0911  . HYDROcodone-acetaminophen (NORCO/VICODIN) 5-325 MG per tablet 1-2 tablet  1-2 tablet Oral Q4H PRN Shahmehdi, Seyed A, MD      .  labetalol (NORMODYNE) injection 10 mg  10 mg Intravenous Q4H PRN Noralee Stain, DO   10 mg at 11/28/18 1722  . labetalol (NORMODYNE) tablet 300 mg  300 mg Oral TID Noralee Stain, DO   300 mg at 11/29/18 0910  . lip balm (CARMEX) ointment 1 application  1 application Topical PRN Noralee Stain, DO      . LORazepam (ATIVAN) injection 2-3 mg  2-3 mg Intravenous Q1H PRN Noralee Stain, DO   2 mg at 11/29/18 0602  . magnesium oxide (MAG-OX) tablet 400 mg  400 mg Oral BID Dorcas Carrow, MD   400 mg at 11/29/18 0911  . MEDLINE mouth rinse  15 mL Mouth Rinse BID Noralee Stain, DO   15 mL at 11/29/18 1035  . multivitamin with minerals tablet 1 tablet  1 tablet Oral Daily Noralee Stain, DO   1 tablet at 11/29/18 0911  . ondansetron (ZOFRAN) tablet 4 mg  4 mg Oral Q6H PRN Shahmehdi, Seyed A, MD       Or  . ondansetron (ZOFRAN) injection 4 mg  4 mg Intravenous Q6H PRN Shahmehdi, Seyed A, MD      . QUEtiapine (SEROQUEL) tablet 50 mg  50 mg Oral BID Noralee Stain, DO   50 mg at 11/29/18 0910  . senna-docusate (Senokot-S) tablet 1 tablet  1 tablet Oral QHS PRN Shahmehdi, Seyed A, MD      . sodium chloride flush (NS) 0.9 % injection 3 mL  3 mL Intravenous Q12H Shahmehdi, Seyed A, MD   3 mL at 11/29/18 0914  . sorbitol 70 % solution 30 mL  30 mL Oral Daily PRN Shahmehdi, Seyed A, MD      . thiamine (VITAMIN B-1) tablet 100 mg  100 mg Oral Daily Antony Madura, PA-C   100 mg at 11/29/18 7829    Musculoskeletal: Strength & Muscle Tone: No atrophy noted. Gait & Station: UTA since patient is sitting in bed. Patient leans: N/A  Psychiatric Specialty Exam: Physical Exam  Nursing note and vitals reviewed. Constitutional: He is oriented to person, place, and time. He appears well-developed and well-nourished.  HENT:  Head: Normocephalic and atraumatic.  Neck: Normal range of motion.  Respiratory: Effort normal.  Musculoskeletal: Normal range of motion.  Neurological: He is alert and oriented to person,  place, and time.  Psychiatric: He has a normal mood and affect. His behavior is normal. Judgment and thought content normal. Cognition and memory are normal.    Review of Systems  Cardiovascular: Negative for chest pain.  Gastrointestinal: Negative for abdominal pain, diarrhea, nausea and vomiting.  Psychiatric/Behavioral: Positive for depression and substance abuse. Negative for hallucinations and suicidal ideas. The patient is nervous/anxious.   All other systems reviewed and are negative.   Blood pressure 130/72, pulse (!) 110, temperature 99.6 F (37.6 C), temperature source Oral, resp. rate (!) 21, height 6\' 2"  (1.88 m), weight 93.4 kg, SpO2 100 %.Body mass index is 26.44 kg/m.  General Appearance: Fairly Groomed, young, African American male, wearing a hospital gown who is sitting in bed. NAD.   Eye Contact:  Good  Speech:  Clear and Coherent and Normal Rate  Volume:  Normal  Mood:  Anxious and Depressed  Affect:  Appropriate and Full Range  Thought Process:  Goal Directed, Linear and Descriptions of Associations: Intact  Orientation:  Full (Time, Place, and Person)  Thought Content:  Logical  Suicidal Thoughts:  No  Homicidal Thoughts:  No  Memory:  Immediate;   Good Recent;   Good Remote;   Good  Judgement:  Fair  Insight:  Fair  Psychomotor Activity:  Normal  Concentration:  Concentration: Good and Attention Span: Good  Recall:  Good  Fund of Knowledge:  Good  Language:  Good  Akathisia:  No  Handed:  Right  AIMS (if indicated):   N/A  Assets:  Communication Skills Desire for Improvement Housing Intimacy Leisure Time Physical Health Resilience Social Support  ADL's:  Intact  Cognition:  WNL  Sleep:   N/A   Assessment:  Brett Molina is a 28 y.o. male who was admitted with alcohol withdrawal on 5/17. His hospital course has been complicated by intermittent disorientation and hallucinations. On interview, he is oriented to person, place and time. He  denies SI, HI or AVH. He does not appear to be responding to internal stimuli. He reports mild depression and anxiety in the setting of heavy alcohol use. He is agreeable to starting a medication for alcohol use. Please have SW provide patient with resources for substance abuse treatment and medication management.    Treatment Plan Summary: -Start Gabapentin 100 mg TID for alcohol use/anxiety. Can increase up to 300 mg TID if patient tolerates medication. -Continue Seroquel 50 mg BID PRN for hallucinations related to alcohol use (likely delirium given waxing and waning mental status).  -EKG reviewed and QTc 464 on 5/16. Please closely monitor when starting or increasing QTc prolonging agents.  -Please have SW provide patient with resources for substance abuse treatment and psychiatrists.  -Psychiatry will sign off on patient at this time. Please consult psychiatry again as needed.   Disposition: No evidence of imminent risk to self or others at present.   Patient does not meet criteria for psychiatric inpatient admission.  This service was provided via telemedicine using a 2-way, interactive audio and video technology.  Names of all persons participating in this telemedicine service and their role in this encounter. Name: Brett Beets, DO Role: Psychiatrist  Name: Brett Molina Role: Patient     Cherly Beach, DO 11/29/2018 1:28 PM

## 2018-11-29 NOTE — Progress Notes (Signed)
Physical Therapy Treatment Patient Details Name: Brett Molina MRN: [id number] DOB: January 20, 1991 Today's Date: 11/29/2018    History of Present Illness Brett Molina is a 28 y.o. male with PHMx: th medical history significant of   hypertension, GERD, chronic alcohol abuse, history of upper GI, and depression presented to the ED on 5/16 in  alcohol intoxication state.    PT Comments    Pt awake in bed eager to walk.  Impulsive.  VC's for safety.  General transfer comment: 50% VC's for direction and safety.  + 2 side by side assist due to ataxic mvmts and over reactive motor control.  Increased assist with stand to sit to to uncontrolled decend.  General Gait Details: + 2 side by side asssit with VERY unsteady, ataxic gait with poor self ability to control over reactive mvtms.  Excessive lean on walker.  Poor balance.  Mild c/o dizziness.    Pt admits to drinking in excess straight [employer] "sometimes ice"   Pt D discussed going to a drug Rehab.     Follow Up Recommendations  Supervision/Assistance - 24 hour;SNF(Inpt Pych)     Equipment Recommendations       Recommendations for Other Services       Precautions / Restrictions Precautions Precautions: Fall Precaution Comments: monitor HR and BP Restrictions Weight Bearing Restrictions: No    Mobility  Bed Mobility Overal bed mobility: Needs Assistance       Supine to sit: Supervision;Min guard     General bed mobility comments: close guard for safety, impulsive, poor awarness,   Transfers Overall transfer level: Needs assistance Equipment used: Rolling walker (2 wheeled) Transfers: Sit to/from [employer] transfer comment: 50% VC's for direction and safety.  + 2 side by side assist due to ataxic mvmts and over reactive motor control.  Increased assist with stand to sit to to uncontrolled decend.    Ambulation/Gait Ambulation/Gait assistance: Mod assist;+2  safety/equipment Gait Distance (Feet): 45 Feet Assistive device: Rolling walker (2 wheeled) Gait Pattern/deviations: Step-to pattern;Decreased step length - right;Decreased step length - left;Shuffle;Ataxic Gait velocity: decreased   General Gait Details: + 2 side by side asssit with VERY unsteady, ataxic gait with poor self ability to control over reactive mvtms.  Excessive lean on walker.  Poor balance.  Mild c/o dizziness.     Stairs             Wheelchair Mobility    Modified Rankin (Stroke Patients Only)       Balance                                            Cognition Arousal/Alertness: Awake/alert   Overall Cognitive Status: Impaired/Different from baseline Area of Impairment: Safety/judgement;Following commands;Orientation;Attention                 Orientation Level: Disoriented to;Time Current Attention Level: Sustained Memory: Decreased short-term memory Following Commands: Follows one step commands with increased time Safety/Judgement: Decreased awareness of safety;Decreased awareness of deficits   Problem Solving: Requires verbal cues General Comments: tends to move quickly and it takes extra time for him to respond to vc's      Exercises      General Comments        Pertinent Vitals/Pain Pain Assessment: No/denies pain    Home  Living                      Prior Function            PT Goals (current goals can now be found in the care plan section) Progress towards PT goals: Progressing toward goals    Frequency    Min 3X/week      PT Plan Current plan remains appropriate    Co-evaluation              AM-PAC PT "6 Clicks" Mobility   Outcome Measure  Help needed turning from your back to your side while in a flat bed without using bedrails?: A Little Help needed moving from lying on your back to sitting on the side of a flat bed without using bedrails?: A Little Help needed moving to and  from a bed to a chair (including a wheelchair)?: A Lot Help needed standing up from a chair using your arms (e.g., wheelchair or bedside chair)?: A Little Help needed to walk in hospital room?: A Little Help needed climbing 3-5 steps with a railing? : A Lot 6 Click Score: 16    End of Session Equipment Utilized During Treatment: Gait belt Activity Tolerance: Patient limited by fatigue Patient left: in chair;with chair alarm set;with call bell/phone within reach Nurse Communication: Mobility status PT Visit Diagnosis: Ataxic gait (R26.0);Other abnormalities of gait and mobility (R26.89)     Time: 1430-1445 PT Time Calculation (min) (ACUTE ONLY): 15 min  Charges:  $Gait Training: 8-22 mins                     Felecia Shelling  PTA Acute  Rehabilitation Services Pager      [phone] Office      [phone]

## 2018-11-29 NOTE — Progress Notes (Signed)
Pt needed to go to the bathroom.  BSC used.  Pt very shaky when standing and pivoting to [facility].  Two person assist.

## 2018-11-29 NOTE — Progress Notes (Signed)
Pt trying to take gown and leads off saying that it was bothering him.  Pt appears pleasantly anxious and agitated.  Oriented x 4.  Asking for some medicine to help him rest.  Tremors and tingling in BUE continues.  CIWA 11.  Ativan 2 mg given.

## 2018-11-29 NOTE — Progress Notes (Addendum)
Pt very fidgety. Took socks off and sheet off the bed and threw it in the floor. When I asked him why he took them off he said that he "saw that there were new sheets in the chair so he was preparing for the new sheets". A&O x 4

## 2018-11-29 NOTE — Progress Notes (Signed)
Pt quite agitated, taking sacral foam off, picking at wires, rolling sheets up.  Oriented x4.  Pt requested medicine to calm help him not be so "fidgety."

## 2018-11-29 NOTE — Progress Notes (Signed)
Pt just asked me "was there a guy just sitting in that chair over there? I saw someone sitting in that chair and then he got up and left. Or am I just tripping? I think I am paranoid from the coronavirus. Can I have a mask?" A&Ox4. Will continue to monitor.

## 2018-11-29 NOTE — Progress Notes (Signed)
PROGRESS NOTE    Brett Molina  WUJ:[id number] DOB: 07/09/1990 DOA: 11/19/2018 PCP: Patient, No Pcp Per     Brief Narrative:  Brett Molina is a 28 year old male with alcoholism and currently with heavy binge drinking, history of hypertension who presented to the emergency room with intoxication and unable to ambulate without assistance.  In the ER he was intoxicated with alcohol level of 411.  He was also found to be severely dehydrated.  Treated in the emergency room and found to be ataxic to ambulate and with alcohol withdrawal so admitted to the hospital.  He was transferred to stepdown unit overnight on 5/20-5/21 due to increasing CIWA scale and worsening disorientation, hallucinations. Overnight 5/24-5/25, he continued to worsen, was moved to ICU for precedex drip.   On review of his admission in June 2018, his prolonged hospital course was as follows: 6/9 admit 6/11 on precedex 6/15 off precedex and out of ICU 6/17 back on precedex 6/19 off precedex and out of ICU  6/20 intermittent hallucinations and delirium  6/24 discharge to inpatient psych facility  New events last 24 hours / Subjective: Precedex drip turned off yesterday morning. Has been oriented x 4 but still with very poor insight, some hallucinations, anxiety, tremors.   Assessment & Plan:   Principal Problem:   Alcohol withdrawal (HCC) Active Problems:   Alcoholic hepatitis without ascites   Alcohol abuse   Gastroesophageal reflux disease with esophagitis   Ataxia   HTN (hypertension)   Delirium  Alcohol intoxication with alcohol withdrawal, alcoholism with ataxia -Fall precautions. Delirium precautions. -Appreciate Neurology. MRI of the brain and C-spine which was suboptimal, however did not show any significant pathology. Thiamine 500 mg IV once a day for 3 days due to concern for Wernicke's encephalopathy.  Now on oral 100 mg daily. -Continue folate -Seroquel BID  -Out of window for alcohol  withdrawal at this point. ?Underlying psych dx. Consult psych today.   Fever -Unclear etiology of infection.  Blood cultures negative to date.  Chest x-ray personally reviewed which did not reveal any acute infectious process.  WBC normal.  Continue to watch fever curve off of antibiotic. UA unremarkable for infection. Urine culture negative.  -Afebrile now   Hypertension -Continue hydralazine, labetalol   GERD -Continue Pepcid twice daily   DVT prophylaxis: Subq hep  Code Status: Full Family Communication: Discussed with brother over the phone 5/24  Disposition Plan: Remain in stepdown for close monitoring. Psych consulted today    Consultants:   Neurology  PCCM over the phone   Psych   Procedures:   None   Antimicrobials:  Anti-infectives (From admission, onward)   None       Objective: Vitals:   11/29/18 0400 11/29/18 0500 11/29/18 0600 11/29/18 0800  BP: (!) 173/116 118/76 (!) 155/97   Pulse: (!) 111 (!) 110  (!) 124  Resp: 17 20  (!) 21  Temp:      TempSrc:      SpO2: 99% 99%  100%  Weight:   93.4 kg   Height:        Intake/Output Summary (Last 24 hours) at 11/29/2018 1010 Last data filed at 11/29/2018 0500 Gross per 24 hour  Intake 477.25 ml  Output 1700 ml  Net -1222.75 ml   Filed Weights   11/27/18 1700 11/28/18 0500 11/29/18 0600  Weight: 92.2 kg 93.3 kg 93.4 kg    Examination: General exam: Appears calm, no distress Respiratory system: Clear to auscultation. Respiratory effort  normal. Cardiovascular system: S1 & S2 heard, tachycardic, regular rhythm. No JVD, murmurs, rubs, gallops or clicks. No pedal edema. Gastrointestinal system: Abdomen is nondistended, soft and nontender. No organomegaly or masses felt. Normal bowel sounds heard. Central nervous system: Alert and oriented x 3, intentional tremors noted Extremities: Symmetric 5 x 5 power. Skin: No rashes, lesions or ulcers Psychiatry: Judgement and insight appear poor    Data  Reviewed: I have personally reviewed following labs and imaging studies  CBC: Recent Labs  Lab 11/23/18 0539 11/26/18 0812  WBC 9.5 10.1  NEUTROABS  --  7.1  HGB 13.1 13.7  HCT 39.6 41.8  MCV 103.9* 103.5*  PLT 300 272   Basic Metabolic Panel: Recent Labs  Lab 11/23/18 0539  NA 139  K 3.8  CL 105  CO2 22  GLUCOSE 85  BUN 11  CREATININE 0.78  CALCIUM 9.4   GFR: Estimated Creatinine Clearance: 157 mL/min (by C-G formula based on SCr of 0.78 mg/dL). Liver Function Tests: No results for input(s): AST, ALT, ALKPHOS, BILITOT, PROT, ALBUMIN in the last 168 hours. No results for input(s): LIPASE, AMYLASE in the last 168 hours. No results for input(s): AMMONIA in the last 168 hours. Coagulation Profile: No results for input(s): INR, PROTIME in the last 168 hours. Cardiac Enzymes: No results for input(s): CKTOTAL, CKMB, CKMBINDEX, TROPONINI in the last 168 hours. BNP (last 3 results) No results for input(s): PROBNP in the last 8760 hours. HbA1C: No results for input(s): HGBA1C in the last 72 hours. CBG: Recent Labs  Lab 11/23/18 0712  GLUCAP 76   Lipid Profile: No results for input(s): CHOL, HDL, LDLCALC, TRIG, CHOLHDL, LDLDIRECT in the last 72 hours. Thyroid Function Tests: No results for input(s): TSH, T4TOTAL, FREET4, T3FREE, THYROIDAB in the last 72 hours. Anemia Panel: No results for input(s): VITAMINB12, FOLATE, FERRITIN, TIBC, IRON, RETICCTPCT in the last 72 hours. Sepsis Labs: No results for input(s): PROCALCITON, LATICACIDVEN in the last 168 hours.  Recent Results (from the past 240 hour(s))  SARS Coronavirus 2 (CEPHEID - Performed in [facility] hospital lab), Hosp Order     Status: None   Collection Time: 11/19/18  5:45 PM  Result Value Ref Range Status   SARS Coronavirus 2 NEGATIVE NEGATIVE Final    Comment: (NOTE) If result is NEGATIVE SARS-CoV-2 target nucleic acids are NOT DETECTED. The SARS-CoV-2 RNA is generally detectable in upper and lower   respiratory specimens during the acute phase of infection. The lowest  concentration of SARS-CoV-2 viral copies this assay can detect is 250  copies / mL. A negative result does not preclude SARS-CoV-2 infection  and should not be used as the sole basis for treatment or other  patient management decisions.  A negative result may occur with  improper specimen collection / handling, submission of specimen other  than nasopharyngeal swab, presence of viral mutation(s) within the  areas targeted by this assay, and inadequate number of viral copies  (<250 copies / mL). A negative result must be combined with clinical  observations, patient history, and epidemiological information. If result is POSITIVE SARS-CoV-2 target nucleic acids are DETECTED. The SARS-CoV-2 RNA is generally detectable in upper and lower  respiratory specimens dur ing the acute phase of infection.  Positive  results are indicative of active infection with SARS-CoV-2.  Clinical  correlation with patient history and other diagnostic information is  necessary to determine patient infection status.  Positive results do  not rule out bacterial infection or co-infection with other  viruses. If result is PRESUMPTIVE POSTIVE SARS-CoV-2 nucleic acids MAY BE PRESENT.   A presumptive positive result was obtained on the submitted specimen  and confirmed on repeat testing.  While 2019 novel coronavirus  (SARS-CoV-2) nucleic acids may be present in the submitted sample  additional confirmatory testing may be necessary for epidemiological  and / or clinical management purposes  to differentiate between  SARS-CoV-2 and other Sarbecovirus currently known to infect humans.  If clinically indicated additional testing with an alternate test  methodology [phone]) is advised. The SARS-CoV-2 RNA is generally  detectable in upper and lower respiratory sp ecimens during the acute  phase of infection. The expected result is Negative. Fact  Sheet for Patients:  BoilerBrush.com.cy Fact Sheet for Healthcare Providers: https://pope.com/ This test is not yet approved or cleared by the [place] FDA and has been authorized for detection and/or diagnosis of SARS-CoV-2 by FDA under an Emergency Use Authorization (EUA).  This EUA will remain in effect (meaning this test can be used) for the duration of the COVID-19 declaration under Section 564(b)(1) of the Act, 21 U.S.C. section 360bbb-3(b)(1), unless the authorization is terminated or revoked sooner. Performed at [facility], [address], [place], [place] [address]   MRSA PCR Screening     Status: None   Collection Time: 11/24/18  2:00 AM  Result Value Ref Range Status   MRSA by PCR NEGATIVE NEGATIVE Final    Comment:        The GeneXpert MRSA Assay (FDA approved for NASAL specimens only), is one component of a comprehensive MRSA colonization surveillance program. It is not intended to diagnose MRSA infection nor to guide or monitor treatment for MRSA infections. Performed at [facility], [address] [place], [place] [address]   Culture, blood (routine x 2)     Status: None (Preliminary result)   Collection Time: 11/26/18  8:09 AM  Result Value Ref Range Status   Specimen Description   Final    BLOOD RIGHT ANTECUBITAL Performed at [facility], [address] [place], [place] [address]    Special Requests   Final    BOTTLES DRAWN AEROBIC AND ANAEROBIC Blood Culture adequate volume Performed at [facility], [address], [place], [place] [address]    Culture   Final    NO GROWTH 3 DAYS Performed at [facility], [address], [place], [place] [address]    Report Status PENDING  Incomplete  Culture, blood (routine x 2)     Status: None (Preliminary result)   Collection Time: 11/26/18  8:11 AM  Result Value Ref Range  Status   Specimen Description   Final    BLOOD RIGHT HAND Performed at [facility], [address], [place], [place] [address]    Special Requests   Final    BOTTLES DRAWN AEROBIC ONLY Blood Culture adequate volume Performed at [facility], [address], [place], [place] [address]    Culture   Final    NO GROWTH 3 DAYS Performed at [facility], [address] [place], [place] [address]    Report Status PENDING  Incomplete  Culture, Urine     Status: None   Collection Time: 11/27/18  1:11 PM  Result Value Ref Range Status   Specimen Description   Final    URINE, CLEAN CATCH Performed at [facility], [address], [place], [place] [address]  Special Requests   Final    NONE Performed at [facility], [address] [place], [place] [address]    Culture   Final    NO GROWTH Performed at [facility], [address] [place], [place] [address]    Report Status 11/28/2018 FINAL  Final       Radiology Studies: No results found.    Scheduled Meds: . Chlorhexidine Gluconate Cloth  6 each Topical Daily  . docusate sodium  100 mg Oral BID  . famotidine  20 mg Oral BID  . feeding supplement (ENSURE ENLIVE)  237 mL Oral TID BM  . feeding supplement (PRO-STAT SUGAR FREE 64)  30 mL Oral BID  . folic acid  1 mg Oral Daily  . heparin  5,000 Units Subcutaneous Q8H  . hydrALAZINE  25 mg Oral TID  . labetalol  300 mg Oral TID  . magnesium oxide  400 mg Oral BID  . mouth rinse  15 mL Mouth Rinse BID  . multivitamin with minerals  1 tablet Oral Daily  . QUEtiapine  50 mg Oral BID  . sodium chloride flush  3 mL Intravenous Q12H  . thiamine  100 mg Oral Daily   Continuous Infusions:    LOS: 9 days    Time spent: 25 minutes   Noralee Stain, DO [facility] [email or web address] 11/29/2018, 10:10 AM

## 2018-11-30 DIAGNOSIS — F1020 Alcohol dependence, uncomplicated: Secondary | ICD-10-CM

## 2018-11-30 MED ORDER — HALOPERIDOL LACTATE 5 MG/ML IJ SOLN
5.0000 mg | Freq: Once | INTRAMUSCULAR | Status: AC
  Administered 2018-11-30: 5 mg via INTRAVENOUS
  Filled 2018-11-30: qty 1

## 2018-11-30 NOTE — Progress Notes (Addendum)
Nutrition Follow-up  RD working remotely.   DOCUMENTATION CODES:   Not applicable  INTERVENTION:  - continue Ensure Enlive TID and Prostat BID. - continue to encourage PO intakes.    NUTRITION DIAGNOSIS:   Inadequate oral intake related to lethargy/confusion, acute illness as evidenced by meal completion < 50%. -improving  GOAL:   Patient will meet greater than or equal to 90% of their needs -minimally met on average now  MONITOR:   PO intake, Supplement acceptance, Labs, Weight trends  ASSESSMENT:   28 y.o. male with medical history significant of HTN, GERD, chronic alcohol abuse, history of upper GIB, and depression. He presented to the ED on 5/16 in alcohol intoxication state. He was kept in the ED and treated with CIWA protocol, IV fluid hydration. Patient reported that he had not been able to ambulate in the past 2-3 weeks, has borrowed a walker to assist him in ambulation. Admits that his drinking habit history started at age 69, and that in the past 2 months  he has been drinking heavily, typically drinking 1.5L of wine/day and did so about an hour before presentation to the ED on 5/16. Patient reports heavy drinking d/t being laid off from his job due to the pandemic.   Weight has fluctuated slightly throughout hospitalization but has been mainly stable the past 8 days. Per RN flow sheet, patient is a/o to self only. Notes indicate patient continues to have visual hallucinations.    Spoke with individual who was briefly relieving scheduled sitter. She reports that patient is drowsy at this time. She has offered to get/order something for patient to eat but has been unable to get much of a response from patient d/t drowsiness/current mentation. She states that she was informed that patient was awake most of the night. Per notes from earlier this AM, patient had been given Haldol overnight.   Per flow sheet, patient consumed 75% of breakfast on 5/22; 90% of lunch and 0% of  dinner on 5/23; 50% of breakfast and lunch on 5/24; 100% of breakfast, 90% of lunch, and 20% of dinner on 5/25. Per review of orders, patient has accepted 13 of 16 bottles of Ensure and 11 of 12 packets of prostat offered since last RD assessment (5/21).   Per notes: delirium with questionable underlying psych diagnosis as he is out of window for alcohol withdrawal; Psych consulted on 5/26.     Medications reviewed; 100 mg colace BID, 20 mg oral pepcid BID, 1 mg folvite/day, 400 mg mag-ox BID, daily multivitamin with minerals, 100 mg thiamine/day.  Labs reviewed; CBGs: 92, 85, and 76 mg/dl today.     NUTRITION - FOCUSED PHYSICAL EXAM:  unable to complete at this time.   Diet Order:   Diet Order            Diet regular Room service appropriate? Yes; Fluid consistency: Thin  Diet effective now              EDUCATION NEEDS:   Not appropriate for education at this time  Skin:  Skin Assessment: Reviewed RN Assessment  Last BM:  5/26  Height:   Ht Readings from Last 1 Encounters:  11/24/18 6' 2"  (1.88 m)    Weight:   Wt Readings from Last 1 Encounters:  11/30/18 94.4 kg    Ideal Body Weight:  86.4 kg  BMI:  Body mass index is 26.72 kg/m.  Estimated Nutritional Needs:   Kcal:  2150-2345 kcal  Protein:  120-130 grams  Fluid:  >/= 2.2 L/day     Jarome Matin, MS, RD, LDN, [facility] Inpatient Clinical Dietitian Pager # [phone] After hours/weekend pager # [phone]

## 2018-11-30 NOTE — Progress Notes (Signed)
PROGRESS NOTE    Brett Molina  MAY:[id number] DOB: 29-May-1991 DOA: 11/19/2018 PCP: Patient, No Pcp Per   Brief Narrative:  Brett Molina is a 28 year old male with alcoholism and currently with heavy binge drinking, history of hypertension who presented to the emergency room with intoxication and unable to ambulate without assistance. In the ER he was intoxicated with alcohol level of 411. He was also found to be severely dehydrated. Treated in the emergency room and found to be ataxic to ambulate and with alcohol withdrawal so admitted to the hospital.  He was transferred to stepdown unit overnight on 5/20-5/21 due to increasing CIWA scale and worsening disorientation, hallucinations. Overnight 5/24-5/25, he continued to worsen, was moved to ICU for precedex drip.   On review of his admission in June 2018, his prolonged hospital course was as follows: 6/9 admit 6/11onprecedex 6/15 off precedexand out of ICU 6/17back on precedex 6/19off precedex and out of ICU 6/20 intermittent hallucinations and delirium 6/24 discharge to inpatient psych facility  Consultants:   Psychiatry  Neurology  Procedures:   None  Antimicrobials:   None   Subjective: Patient seen and examined.  Patient was too lethargic to hold any conversation.  He was very fidgety and had gross tremors.  Per nurses, he was having significant visual as well as auditory hallucinations and this is significant change compared to how he was yesterday.  Has been requiring significant amount of Ativan as well.  Objective: Vitals:   11/30/18 0800 11/30/18 0809 11/30/18 0900 11/30/18 1100  BP:      Pulse:      Resp: (!) 30  17   Temp:    99.1 F (37.3 C)  TempSrc:    Oral  SpO2:  95%    Weight:      Height:        Intake/Output Summary (Last 24 hours) at 11/30/2018 1500 Last data filed at 11/30/2018 0014 Gross per 24 hour  Intake -  Output 700 ml  Net -700 ml   Filed Weights   11/28/18  0500 11/29/18 0600 11/30/18 0500  Weight: 93.3 kg 93.4 kg 94.4 kg    Examination:  General exam: Very lethargic, anxious and gross tremors Respiratory system: Clear to auscultation. Respiratory effort normal. Cardiovascular system: S1 & S2 heard, RRR. No JVD, murmurs, rubs, gallops or clicks. No pedal edema. Gastrointestinal system: Abdomen is nondistended, soft and nontender. No organomegaly or masses felt. Normal bowel sounds heard. Central nervous system: Lethargic and disoriented with gross tremors.  Moving all extremities spontaneously.  Has increased muscle tone but he is able to relax them when asked to do so. Extremities: Symmetric 5 x 5 power. Skin: No rashes, lesions or ulcers Psychiatry: Judgement and insight appear poor. Mood & affect flat   Data Reviewed: I have personally reviewed following labs and imaging studies  CBC: Recent Labs  Lab 11/26/18 0812  WBC 10.1  NEUTROABS 7.1  HGB 13.7  HCT 41.8  MCV 103.5*  PLT 272   Basic Metabolic Panel: No results for input(s): NA, K, CL, CO2, GLUCOSE, BUN, CREATININE, CALCIUM, MG, PHOS in the last 168 hours. GFR: Estimated Creatinine Clearance: 157 mL/min (by C-G formula based on SCr of 0.78 mg/dL). Liver Function Tests: No results for input(s): AST, ALT, ALKPHOS, BILITOT, PROT, ALBUMIN in the last 168 hours. No results for input(s): LIPASE, AMYLASE in the last 168 hours. No results for input(s): AMMONIA in the last 168 hours. Coagulation Profile: No results for input(s): INR,  PROTIME in the last 168 hours. Cardiac Enzymes: No results for input(s): CKTOTAL, CKMB, CKMBINDEX, TROPONINI in the last 168 hours. BNP (last 3 results) No results for input(s): PROBNP in the last 8760 hours. HbA1C: No results for input(s): HGBA1C in the last 72 hours. CBG: No results for input(s): GLUCAP in the last 168 hours. Lipid Profile: No results for input(s): CHOL, HDL, LDLCALC, TRIG, CHOLHDL, LDLDIRECT in the last 72 hours. Thyroid  Function Tests: No results for input(s): TSH, T4TOTAL, FREET4, T3FREE, THYROIDAB in the last 72 hours. Anemia Panel: No results for input(s): VITAMINB12, FOLATE, FERRITIN, TIBC, IRON, RETICCTPCT in the last 72 hours. Sepsis Labs: No results for input(s): PROCALCITON, LATICACIDVEN in the last 168 hours.  Recent Results (from the past 240 hour(s))  MRSA PCR Screening     Status: None   Collection Time: 11/24/18  2:00 AM  Result Value Ref Range Status   MRSA by PCR NEGATIVE NEGATIVE Final    Comment:        The GeneXpert MRSA Assay (FDA approved for NASAL specimens only), is one component of a comprehensive MRSA colonization surveillance program. It is not intended to diagnose MRSA infection nor to guide or monitor treatment for MRSA infections. Performed at [facility], [address] [place], [place] [address]   Culture, blood (routine x 2)     Status: None (Preliminary result)   Collection Time: 11/26/18  8:09 AM  Result Value Ref Range Status   Specimen Description   Final    BLOOD RIGHT ANTECUBITAL Performed at [facility], [address] [place], [place] [address]    Special Requests   Final    BOTTLES DRAWN AEROBIC AND ANAEROBIC Blood Culture adequate volume Performed at [facility], [address] [place], [place] [address]    Culture   Final    NO GROWTH 4 DAYS Performed at [facility], [address] [place], [place] [address]    Report Status PENDING  Incomplete  Culture, blood (routine x 2)     Status: None (Preliminary result)   Collection Time: 11/26/18  8:11 AM  Result Value Ref Range Status   Specimen Description   Final    BLOOD RIGHT HAND Performed at [facility], [address] [place], [place] [address]    Special Requests   Final    BOTTLES DRAWN AEROBIC ONLY Blood Culture adequate volume Performed at [facility], [address],  [place], [place] [address]    Culture   Final    NO GROWTH 4 DAYS Performed at [facility], [address] [place], [place] [address]    Report Status PENDING  Incomplete  Culture, Urine     Status: None   Collection Time: 11/27/18  1:11 PM  Result Value Ref Range Status   Specimen Description   Final    URINE, CLEAN CATCH Performed at [facility], [address] [place], [place] [address]    Special Requests   Final    NONE Performed at [facility], [address], [place], [place] [address]    Culture   Final    NO GROWTH Performed at [facility], [address] [place], [place] [address]    Report Status 11/28/2018 FINAL  Final      Radiology Studies: No results found.  Scheduled Meds: . Chlorhexidine Gluconate Cloth  6 each Topical Daily  .  docusate sodium  100 mg Oral BID  . famotidine  20 mg Oral BID  . feeding supplement (ENSURE ENLIVE)  237 mL Oral TID BM  . feeding supplement (PRO-STAT SUGAR FREE 64)  30 mL Oral BID  . folic acid  1 mg Oral Daily  . gabapentin  100 mg Oral TID  . heparin  5,000 Units Subcutaneous Q8H  . hydrALAZINE  25 mg Oral TID  . labetalol  300 mg Oral TID  . magnesium oxide  400 mg Oral BID  . mouth rinse  15 mL Mouth Rinse BID  . multivitamin with minerals  1 tablet Oral Daily  . sodium chloride flush  3 mL Intravenous Q12H  . thiamine  100 mg Oral Daily   Continuous Infusions:   LOS: 10 days   Assessment & Plan:   Principal Problem:   Alcohol use disorder, severe, dependence (HCC) Active Problems:   Alcoholic hepatitis without ascites   Alcohol withdrawal (HCC)   Alcohol abuse   Gastroesophageal reflux disease with esophagitis   Ataxia   HTN (hypertension)  Alcohol intoxication with alcohol withdrawal/delirium tremens: Patient has been off of Precedex drip for past 48 hours.  According to nursing staff, he was doing fine yesterday but has had significant worsening of  hallucination, anxiety and tremors since yesterday and has required significant amount of Ativan due to high CIWA score.  He was started on gabapentin yesterday for alcohol withdrawal.  I wonder if this is causing all these symptoms at this point in time I will hold off to that and continue Seroquel as needed as well as Ativan as needed per CIWA scale.  He has had head CT and brain MRI during this hospitalization and was diagnosed to be having Wernicke's encephalopathy.  He was also evaluated by psychiatry yesterday.  Continue multivitamins and folate and thiamine.  Fever: He has remained afebrile for last several days.  Blood cultures all negative.  All the basic infectious work-up was unremarkable as well.  Hypertension: Blood pressure reasonably controlled.  Continue scheduled labetalol and hydralazine as well as as needed hydralazine.  GERD: Continue Pepcid twice daily.  DVT prophylaxis: Heparin Code Status: Full code Family Communication: No family present at the bedside Disposition Plan: Remain in stepdown for close monitoring.   Time spent: 40 min    Hughie Closs, MD [facility] Pager [phone]  If 7PM-7AM, please contact night-coverage [email or web address] Password [email or web address] 11/30/2018, 3:00 PM

## 2018-11-30 NOTE — Progress Notes (Signed)
Seroquel given for hallucinations.  Patient thinks he is playing a football video game and insists he brought his bed from down stairs up to this room and carried it.  Patient also seeing a girl on ceiling and wants to help her get down.  Nurse attempted to reorient patient.  Patient has been trying to get out of bed without assistance.  Bed alarm on.  Monitoring closely.

## 2018-11-30 NOTE — Progress Notes (Signed)
Paged Schorr, NP to make her aware that patient is hallucinating and CIWA score 25 and ativan does not seem to be helping.  Seroquel was also given and has not helped.

## 2018-11-30 NOTE — Progress Notes (Signed)
Patient is more calm since haldol but remains restless and hallucinating.  NT sitting at bedside as Recruitment consultant.

## 2018-11-30 NOTE — Progress Notes (Signed)
OT Cancellation Note  Patient Details Name: Brett Molina MRN: [id number] DOB: [birth year]   Cancelled Treatment:    Reason Eval/Treat Not Completed: Other (comment); spoke with RN who reports pt with change in status overnight, increased hallucinations and currently lethargic. Request to hold OT treatment at this time. Will follow up as schedule permits.  Marcy Siren, OT [facility] Pager [phone] Office [phone]   Orlando Penner 11/30/2018, 12:45 PM

## 2018-12-01 LAB — COMPREHENSIVE METABOLIC PANEL
ALT: 43 U/L (ref 0–44)
AST: 28 U/L (ref 15–41)
Albumin: 4.3 g/dL (ref 3.5–5.0)
Alkaline Phosphatase: 43 U/L (ref 38–126)
Anion gap: 12 (ref 5–15)
BUN: 14 mg/dL (ref 6–20)
CO2: 22 mmol/L (ref 22–32)
Calcium: 9.9 mg/dL (ref 8.9–10.3)
Chloride: 103 mmol/L (ref 98–111)
Creatinine, Ser: 0.84 mg/dL (ref 0.61–1.24)
GFR calc Af Amer: >60 mL/min (ref >60)
GFR calc non Af Amer: >60 mL/min (ref >60)
Glucose, Bld: 145 mg/dL — ABNORMAL HIGH (ref 70–99)
Potassium: 3.9 mmol/L (ref 3.5–5.1)
Sodium: 137 mmol/L (ref 135–145)
Total Bilirubin: 0.3 mg/dL (ref 0.3–1.2)
Total Protein: 8 g/dL (ref 6.5–8.1)

## 2018-12-01 LAB — CULTURE, BLOOD (ROUTINE X 2)
Culture: NO GROWTH
Culture: NO GROWTH
Special Requests: ADEQUATE
Special Requests: ADEQUATE

## 2018-12-01 LAB — CBC WITH DIFFERENTIAL/PLATELET
Abs Immature Granulocytes: 0.03 10*3/uL (ref 0.00–0.07)
Basophils Absolute: 0 10*3/uL (ref 0.0–0.1)
Basophils Relative: 0 %
Eosinophils Absolute: 0 10*3/uL (ref 0.0–0.5)
Eosinophils Relative: 0 %
HCT: 41.1 % (ref 39.0–52.0)
Hemoglobin: 13.7 g/dL (ref 13.0–17.0)
Immature Granulocytes: 0 %
Lymphocytes Relative: 14 %
Lymphs Abs: 1.6 10*3/uL (ref 0.7–4.0)
MCH: 34.7 pg — ABNORMAL HIGH (ref 26.0–34.0)
MCHC: 33.3 g/dL (ref 30.0–36.0)
MCV: 104.1 fL — ABNORMAL HIGH (ref 80.0–100.0)
Monocytes Absolute: 0.8 10*3/uL (ref 0.1–1.0)
Monocytes Relative: 7 %
Neutro Abs: 8.8 10*3/uL — ABNORMAL HIGH (ref 1.7–7.7)
Neutrophils Relative %: 79 %
Platelets: 412 10*3/uL — ABNORMAL HIGH (ref 150–400)
RBC: 3.95 MIL/uL — ABNORMAL LOW (ref 4.22–5.81)
RDW: 12.7 % (ref 11.5–15.5)
WBC: 11.3 10*3/uL — ABNORMAL HIGH (ref 4.0–10.5)
nRBC: 0 % (ref 0.0–0.2)

## 2018-12-01 LAB — MAGNESIUM: Magnesium: 2.3 mg/dL (ref 1.7–2.4)

## 2018-12-01 NOTE — Progress Notes (Signed)
Physical Therapy Treatment Patient Details Name: Brett Molina MRN: [id number] DOB: 1991/04/29 Today's Date: 12/01/2018    History of Present Illness Brett Molina is a 28 y.o. male with PHMx: th medical history significant of   hypertension, GERD, chronic alcohol abuse, history of upper GI, and depression presented to the ED on 5/16 in  alcohol intoxication state.    PT Comments    Pt tolerated increased distance of 60' with ambulation with RW, min assist for balance due to ataxia, distance limited by fatigue, diaphoresis, elevated HR of 125.   Follow Up Recommendations  Supervision/Assistance - 24 hour;SNF(Inpt Pych)     Equipment Recommendations  Rolling walker with 5" wheels    Recommendations for Other Services       Precautions / Restrictions Precautions Precautions: Fall Precaution Comments: monitor HR and BP Restrictions Weight Bearing Restrictions: No    Mobility  Bed Mobility Overal bed mobility: Needs Assistance       Supine to sit: Supervision     General bed mobility comments: supervision for safety  Transfers Overall transfer level: Needs assistance Equipment used: Rolling walker (2 wheeled) Transfers: Sit to/from Stand Sit to Stand: From elevated surface;+2 safety/equipment;Min assist         General transfer comment: 50% VC's for direction and safety.  + 2 side by side assist due to ataxic mvmts and over reactive motor control.  Increased assist with stand to sit to to uncontrolled descent   Ambulation/Gait Ambulation/Gait assistance: +2 safety/equipment;Min assist Gait Distance (Feet): 60 Feet Assistive device: Rolling walker (2 wheeled) Gait Pattern/deviations: Step-to pattern;Decreased step length - right;Decreased step length - left;Ataxic Gait velocity: decreased   General Gait Details: +2 to follow with chair, pt tremulous and ataxic throughout ambulation, HR 125 walking, pt quite diaphoretic with ambulation   Stairs              Wheelchair Mobility    Modified Rankin (Stroke Patients Only)       Balance Overall balance assessment: Needs assistance Sitting-balance support: No upper extremity supported;Feet supported Sitting balance-Leahy Scale: Fair     Standing balance support: Bilateral upper extremity supported;During functional activity Standing balance-Leahy Scale: Poor Standing balance comment: reliant on UE support and PT for steadying                            Cognition Arousal/Alertness: Awake/alert Behavior During Therapy: WFL for tasks assessed/performed Overall Cognitive Status: Within Functional Limits for tasks assessed                                        Exercises      General Comments        Pertinent Vitals/Pain Pain Assessment: No/denies pain    Home Living                      Prior Function            PT Goals (current goals can now be found in the care plan section) Acute Rehab PT Goals Patient Stated Goal: walk like before, play and watch sports PT Goal Formulation: With patient Time For Goal Achievement: 12/05/18 Potential to Achieve Goals: Fair Progress towards PT goals: Progressing toward goals    Frequency    Min 3X/week      PT Plan Current plan remains appropriate  Co-evaluation              AM-PAC PT "6 Clicks" Mobility   Outcome Measure  Help needed turning from your back to your side while in a flat bed without using bedrails?: A Little Help needed moving from lying on your back to sitting on the side of a flat bed without using bedrails?: A Little Help needed moving to and from a bed to a chair (including a wheelchair)?: A Lot Help needed standing up from a chair using your arms (e.g., wheelchair or bedside chair)?: A Little Help needed to walk in hospital room?: A Little Help needed climbing 3-5 steps with a railing? : A Lot 6 Click Score: 16    End of Session Equipment  Utilized During Treatment: Gait belt Activity Tolerance: Patient limited by fatigue Patient left: in chair;with chair alarm set;with call bell/phone within reach Nurse Communication: Mobility status PT Visit Diagnosis: Ataxic gait (R26.0);Other abnormalities of gait and mobility (R26.89)     Time: 1624-4695 PT Time Calculation (min) (ACUTE ONLY): 13 min  Charges:  $Gait Training: 8-22 mins                     Ralene Bathe Kistler PT 12/01/2018  Acute Rehabilitation Services Pager [phone] Office [phone]

## 2018-12-01 NOTE — Progress Notes (Signed)
PROGRESS NOTE    Brett Molina  YEB:[id number] DOB: June 25, 1991 DOA: 11/19/2018 PCP: Patient, No Pcp Per   Brief Narrative:  Brett Molina is a 28 year old male with alcoholism and currently with heavy binge drinking, history of hypertension who presented to the emergency room with intoxication and unable to ambulate without assistance. In the ER he was intoxicated with alcohol level of 411. He was also found to be severely dehydrated. Treated in the emergency room and found to be ataxic to ambulate and with alcohol withdrawal so admitted to the hospital.  He was transferred to stepdown unit overnight on 5/20-5/21 due to increasing CIWA scale and worsening disorientation, hallucinations. Overnight 5/24-5/25, he continued to worsen, was moved to ICU for precedex drip which was eventually stopped on 11/28/2018.  On review of his admission in June 2018, his prolonged hospital course was as follows: 6/9 admit 6/11onprecedex 6/15 off precedexand out of ICU 6/17back on precedex 6/19off precedex and out of ICU 6/20 intermittent hallucinations and delirium 6/24 discharge to inpatient psych facility  Consultants:   Psychiatry  Neurology  Procedures:   None  Antimicrobials:   None   Subjective: Patient seen and examined.  He is completely alert and oriented and he is completely different person today.  According to the nurse who also took care of him yesterday, he has been much better than yesterday.  The last time he required a demon was yesterday around 4 PM.  Objective: Vitals:   12/01/18 0500 12/01/18 0700 12/01/18 0727 12/01/18 0800  BP:  (!) 168/103  (!) 142/64  Pulse:  (!) 115  (!) 115  Resp:  20  (!) 23  Temp:   99.1 F (37.3 C)   TempSrc:   Oral   SpO2:  96%  98%  Weight: 91.2 kg     Height:        Intake/Output Summary (Last 24 hours) at 12/01/2018 0945 Last data filed at 12/01/2018 0727 Gross per 24 hour  Intake 550 ml  Output 1150 ml  Net  -600 ml   Filed Weights   11/29/18 0600 11/30/18 0500 12/01/18 0500  Weight: 93.4 kg 94.4 kg 91.2 kg    Examination:  General exam: Appears calm and comfortable  Respiratory system: Clear to auscultation. Respiratory effort normal. Cardiovascular system: S1 & S2 heard, RRR. No JVD, murmurs, rubs, gallops or clicks. No pedal edema. Gastrointestinal system: Abdomen is nondistended, soft and nontender. No organomegaly or masses felt. Normal bowel sounds heard. Central nervous system: Alert and oriented. No focal neurological deficits. Extremities: Symmetric 5 x 5 power. Skin: No rashes, lesions or ulcers Psychiatry: Judgement and insight appear normal. Mood & affect appropriate.  Data Reviewed: I have personally reviewed following labs and imaging studies  CBC: Recent Labs  Lab 11/26/18 0812 12/01/18 0829  WBC 10.1 11.3*  NEUTROABS 7.1 8.8*  HGB 13.7 13.7  HCT 41.8 41.1  MCV 103.5* 104.1*  PLT 272 412*   Basic Metabolic Panel: Recent Labs  Lab 12/01/18 0829  NA 137  K 3.9  CL 103  CO2 22  GLUCOSE 145*  BUN 14  CREATININE 0.84  CALCIUM 9.9  MG 2.3   GFR: Estimated Creatinine Clearance: 149.5 mL/min (by C-G formula based on SCr of 0.84 mg/dL). Liver Function Tests: Recent Labs  Lab 12/01/18 0829  AST 28  ALT 43  ALKPHOS 43  BILITOT 0.3  PROT 8.0  ALBUMIN 4.3   No results for input(s): LIPASE, AMYLASE in the last 168 hours.  No results for input(s): AMMONIA in the last 168 hours. Coagulation Profile: No results for input(s): INR, PROTIME in the last 168 hours. Cardiac Enzymes: No results for input(s): CKTOTAL, CKMB, CKMBINDEX, TROPONINI in the last 168 hours. BNP (last 3 results) No results for input(s): PROBNP in the last 8760 hours. HbA1C: No results for input(s): HGBA1C in the last 72 hours. CBG: No results for input(s): GLUCAP in the last 168 hours. Lipid Profile: No results for input(s): CHOL, HDL, LDLCALC, TRIG, CHOLHDL, LDLDIRECT in the last 72  hours. Thyroid Function Tests: No results for input(s): TSH, T4TOTAL, FREET4, T3FREE, THYROIDAB in the last 72 hours. Anemia Panel: No results for input(s): VITAMINB12, FOLATE, FERRITIN, TIBC, IRON, RETICCTPCT in the last 72 hours. Sepsis Labs: No results for input(s): PROCALCITON, LATICACIDVEN in the last 168 hours.  Recent Results (from the past 240 hour(s))  MRSA PCR Screening     Status: None   Collection Time: 11/24/18  2:00 AM  Result Value Ref Range Status   MRSA by PCR NEGATIVE NEGATIVE Final    Comment:        The GeneXpert MRSA Assay (FDA approved for NASAL specimens only), is one component of a comprehensive MRSA colonization surveillance program. It is not intended to diagnose MRSA infection nor to guide or monitor treatment for MRSA infections. Performed at [facility], [address] [place], [place] [address]   Culture, blood (routine x 2)     Status: None (Preliminary result)   Collection Time: 11/26/18  8:09 AM  Result Value Ref Range Status   Specimen Description   Final    BLOOD RIGHT ANTECUBITAL Performed at [facility], [address] [place], [place] [address]    Special Requests   Final    BOTTLES DRAWN AEROBIC AND ANAEROBIC Blood Culture adequate volume Performed at [facility], [address] [place], [place] [address]    Culture   Final    NO GROWTH 4 DAYS Performed at [facility], [address] [place], [place] [address]    Report Status PENDING  Incomplete  Culture, blood (routine x 2)     Status: None (Preliminary result)   Collection Time: 11/26/18  8:11 AM  Result Value Ref Range Status   Specimen Description   Final    BLOOD RIGHT HAND Performed at [facility], [address] [place], [place] [address]    Special Requests   Final    BOTTLES DRAWN AEROBIC ONLY Blood Culture adequate volume Performed at [facility], [address] [place], [place] [address]    Culture   Final    NO GROWTH 4 DAYS Performed at [facility], [address] [place], [place] [address]    Report Status PENDING  Incomplete  Culture, Urine     Status: None   Collection Time: 11/27/18  1:11 PM  Result Value Ref Range Status   Specimen Description   Final    URINE, CLEAN CATCH Performed at [facility], [address] [place], [place] [address]    Special Requests   Final    NONE Performed at [facility], [address] [place], [place] [address]    Culture   Final    NO GROWTH Performed at [facility], [address] [place], [place] [address]    Report Status 11/28/2018 FINAL  Final      Radiology Studies:  No results found.  Scheduled Meds: . Chlorhexidine Gluconate Cloth  6 each Topical Daily  . docusate sodium  100 mg Oral BID  . famotidine  20 mg Oral BID  . feeding supplement (ENSURE ENLIVE)  237 mL Oral TID BM  . feeding supplement (PRO-STAT SUGAR FREE 64)  30 mL Oral BID  . folic acid  1 mg Oral Daily  . heparin  5,000 Units Subcutaneous Q8H  . hydrALAZINE  25 mg Oral TID  . labetalol  300 mg Oral TID  . magnesium oxide  400 mg Oral BID  . mouth rinse  15 mL Mouth Rinse BID  . multivitamin with minerals  1 tablet Oral Daily  . sodium chloride flush  3 mL Intravenous Q12H  . thiamine  100 mg Oral Daily   Continuous Infusions:   LOS: 11 days   Assessment & Plan:   Principal Problem:   Alcohol use disorder, severe, dependence (HCC) Active Problems:   Alcoholic hepatitis without ascites   Alcohol withdrawal (HCC)   Alcohol abuse   Gastroesophageal reflux disease with esophagitis   Ataxia   HTN (hypertension)  Alcohol intoxication with alcohol withdrawal/delirium tremens: Patient has been off of Precedex drip since 11/28/2018.  He was very tremulous yesterday however since about last night, he has been doing much better and he has not required  any Ativan since 4 PM yesterday.  I discontinued his gabapentin yesterday and will continue to keep it off. He has had head CT and brain MRI during this hospitalization and was diagnosed to be having Wernicke's encephalopathy.  He was also evaluated by psychiatry yesterday.  Continue multivitamins and folate and thiamine.  Continue CIWA protocol with as needed Ativan.  Fever: He has remained afebrile for last several days.  Blood cultures all negative.  All the basic infectious work-up was unremarkable as well.  Hypertension: Blood pressure reasonably controlled.  Continue scheduled labetalol and hydralazine as well as as needed hydralazine.  GERD: Continue Pepcid twice daily.  Sinus tachycardia: Although he does not look any anxious but he still has sinus tachycardia.  Wondering if this is his baseline.  Continue with scheduled labetalol.  DVT prophylaxis: Heparin Code Status: Full code Family Communication: No family present at the bedside Disposition Plan: Remain in stepdown for close monitoring.   Time spent: 28 min    Hughie Closs, MD [facility] Pager [phone]  If 7PM-7AM, please contact night-coverage [email or web address] Password [email or web address] 12/01/2018, 9:45 AM

## 2018-12-02 ENCOUNTER — Inpatient Hospital Stay (HOSPITAL_COMMUNITY): Payer: Self-pay

## 2018-12-02 LAB — COMPREHENSIVE METABOLIC PANEL
ALT: 55 U/L — ABNORMAL HIGH (ref 0–44)
AST: 33 U/L (ref 15–41)
Albumin: 4.5 g/dL (ref 3.5–5.0)
Alkaline Phosphatase: 41 U/L (ref 38–126)
Anion gap: 13 (ref 5–15)
BUN: 16 mg/dL (ref 6–20)
CO2: 23 mmol/L (ref 22–32)
Calcium: 10.2 mg/dL (ref 8.9–10.3)
Chloride: 102 mmol/L (ref 98–111)
Creatinine, Ser: 0.87 mg/dL (ref 0.61–1.24)
GFR calc Af Amer: >60 mL/min (ref >60)
GFR calc non Af Amer: >60 mL/min (ref >60)
Glucose, Bld: 108 mg/dL — ABNORMAL HIGH (ref 70–99)
Potassium: 4.3 mmol/L (ref 3.5–5.1)
Sodium: 138 mmol/L (ref 135–145)
Total Bilirubin: 0.4 mg/dL (ref 0.3–1.2)
Total Protein: 7.9 g/dL (ref 6.5–8.1)

## 2018-12-02 LAB — CBC WITH DIFFERENTIAL/PLATELET
Abs Immature Granulocytes: 0.04 10*3/uL (ref 0.00–0.07)
Basophils Absolute: 0 10*3/uL (ref 0.0–0.1)
Basophils Relative: 0 %
Eosinophils Absolute: 0 10*3/uL (ref 0.0–0.5)
Eosinophils Relative: 0 %
HCT: 40.6 % (ref 39.0–52.0)
Hemoglobin: 13.1 g/dL (ref 13.0–17.0)
Immature Granulocytes: 0 %
Lymphocytes Relative: 17 %
Lymphs Abs: 2.1 10*3/uL (ref 0.7–4.0)
MCH: 33.9 pg (ref 26.0–34.0)
MCHC: 32.3 g/dL (ref 30.0–36.0)
MCV: 105.2 fL — ABNORMAL HIGH (ref 80.0–100.0)
Monocytes Absolute: 1.1 10*3/uL — ABNORMAL HIGH (ref 0.1–1.0)
Monocytes Relative: 9 %
Neutro Abs: 9.1 10*3/uL — ABNORMAL HIGH (ref 1.7–7.7)
Neutrophils Relative %: 74 %
Platelets: 442 10*3/uL — ABNORMAL HIGH (ref 150–400)
RBC: 3.86 MIL/uL — ABNORMAL LOW (ref 4.22–5.81)
RDW: 12.7 % (ref 11.5–15.5)
WBC: 12.3 10*3/uL — ABNORMAL HIGH (ref 4.0–10.5)
nRBC: 0 % (ref 0.0–0.2)

## 2018-12-02 LAB — MAGNESIUM: Magnesium: 2.3 mg/dL (ref 1.7–2.4)

## 2018-12-02 LAB — URINALYSIS, ROUTINE W REFLEX MICROSCOPIC
Bilirubin Urine: NEGATIVE
Glucose, UA: NEGATIVE mg/dL
Hgb urine dipstick: NEGATIVE
Ketones, ur: 5 mg/dL — AB
Leukocytes,Ua: NEGATIVE
Nitrite: NEGATIVE
Protein, ur: NEGATIVE mg/dL
Specific Gravity, Urine: 1.03 (ref 1.005–1.030)
pH: 6 (ref 5.0–8.0)

## 2018-12-02 MED ORDER — AMLODIPINE BESYLATE 5 MG PO TABS
5.0000 mg | ORAL_TABLET | Freq: Every day | ORAL | Status: DC
  Administered 2018-12-02 – 2018-12-03 (×2): 5 mg via ORAL
  Filled 2018-12-02 (×2): qty 1

## 2018-12-02 MED ORDER — CARVEDILOL 6.25 MG PO TABS
6.2500 mg | ORAL_TABLET | Freq: Two times a day (BID) | ORAL | Status: DC
  Administered 2018-12-02 (×2): 6.25 mg via ORAL
  Filled 2018-12-02 (×2): qty 1

## 2018-12-02 NOTE — Progress Notes (Addendum)
Physical Therapy Treatment Patient Details Name: Brett Molina MRN: [id number] DOB: [birth year] Today's Date: 12/02/2018    History of Present Illness Brett Molina is a 28 y.o. male with PHMx: th medical history significant of   hypertension, GERD, chronic alcohol abuse, history of upper GI, and depression presented to the ED on 5/16 in  alcohol intoxication state.    PT Comments    Pt ambulated 30' + 40' with RW, with seated rest between trials, distance limited by therapist 2* HR 142 max with ambulation. HR 117 at rest.  Gait remains ataxic. Instructed pt in seated LE strengthening exercises to be done independently to minimize deconditioning during hospitalization.    Follow Up Recommendations  Supervision/Assistance - 24 hour;SNF(Inpt Pych)     Equipment Recommendations  Rolling walker with 5" wheels    Recommendations for Other Services       Precautions / Restrictions Precautions Precautions: Fall Precaution Comments: monitor HR Restrictions Weight Bearing Restrictions: No    Mobility  Bed Mobility               General bed mobility comments: up in recliner  Transfers Overall transfer level: Needs assistance Equipment used: Rolling walker (2 wheeled) Transfers: Sit to/from Stand Sit to Stand: Min assist         General transfer comment: VCs hand placement  Ambulation/Gait   Gait Distance (Feet): 70 Feet(30' + 40' with seated rest break) Assistive device: Rolling walker (2 wheeled) Gait Pattern/deviations: Step-to pattern;Decreased step length - right;Decreased step length - left;Ataxic Gait velocity: decreased   General Gait Details: +2 to follow with chair, 30' + 40' with seated rest 2* elevated HR, pt tremulous and ataxic throughout ambulation, HR 142 walking   Stairs             Wheelchair Mobility    Modified Rankin (Stroke Patients Only)       Balance Overall balance assessment: Needs assistance Sitting-balance  support: No upper extremity supported;Feet supported Sitting balance-Leahy Scale: Fair     Standing balance support: Bilateral upper extremity supported;During functional activity Standing balance-Leahy Scale: Poor Standing balance comment: reliant on UE support and PT for steadying                            Cognition Arousal/Alertness: Awake/alert Behavior During Therapy: WFL for tasks assessed/performed Overall Cognitive Status: Within Functional Limits for tasks assessed                                        Exercises General Exercises - Lower Extremity Ankle Circles/Pumps: AROM;Both;10 reps;Seated Quad Sets: AROM;Both;5 reps;Supine Long Arc Quad: AROM;5 reps;Both;Seated Hip Flexion/Marching: Both;5 reps;AROM;Standing    General Comments        Pertinent Vitals/Pain Pain Assessment: No/denies pain    Home Living                      Prior Function            PT Goals (current goals can now be found in the care plan section) Acute Rehab PT Goals Patient Stated Goal: walk like before, play and watch sports PT Goal Formulation: With patient Time For Goal Achievement: 12/05/18 Potential to Achieve Goals: Fair Progress towards PT goals: Progressing toward goals    Frequency    Min 3X/week  PT Plan Current plan remains appropriate    Co-evaluation              AM-PAC PT "6 Clicks" Mobility   Outcome Measure  Help needed turning from your back to your side while in a flat bed without using bedrails?: A Little Help needed moving from lying on your back to sitting on the side of a flat bed without using bedrails?: A Little Help needed moving to and from a bed to a chair (including a wheelchair)?: A Little Help needed standing up from a chair using your arms (e.g., wheelchair or bedside chair)?: A Little Help needed to walk in hospital room?: A Little Help needed climbing 3-5 steps with a railing? : A Lot 6  Click Score: 17    End of Session Equipment Utilized During Treatment: Gait belt Activity Tolerance: Patient limited by fatigue;Treatment limited secondary to medical complications (Comment)(elevated HR with activity) Patient left: in chair;with call bell/phone within reach Nurse Communication: Mobility status;Other (comment)(elevated HR with walking) PT Visit Diagnosis: Ataxic gait (R26.0);Other abnormalities of gait and mobility (R26.89)     Time: 6222-9798 PT Time Calculation (min) (ACUTE ONLY): 17 min  Charges:  $Gait Training: 8-22 mins                     Ralene Bathe Kistler PT 12/02/2018  Acute Rehabilitation Services Pager [phone] Office [phone]

## 2018-12-02 NOTE — Progress Notes (Signed)
PROGRESS NOTE    Brett Molina  ZOX:[id number] DOB: 01/31/1991 DOA: 11/19/2018 PCP: Patient, No Pcp Per   Brief Narrative:  Brett Molina is a 28 year old male with alcoholism and currently with heavy binge drinking, history of hypertension who presented to the emergency room with intoxication and unable to ambulate without assistance. In the ER he was intoxicated with alcohol level of 411. He was also found to be severely dehydrated. Treated in the emergency room and found to be ataxic to ambulate and with alcohol withdrawal so admitted to the hospital.  He was transferred to stepdown unit overnight on 5/20-5/21 due to increasing CIWA scale and worsening disorientation, hallucinations. Overnight 5/24-5/25, he continued to worsen, was moved to ICU for precedex drip which was eventually stopped on 11/28/2018.  On review of his admission in June 2018, his prolonged hospital course was as follows: 6/9 admit 6/11onprecedex 6/15 off precedexand out of ICU 6/17back on precedex 6/19off precedex and out of ICU 6/20 intermittent hallucinations and delirium 6/24 discharge to inpatient psych facility  Consultants:   Psychiatry  Neurology  Procedures:   None  Antimicrobials:   None   Subjective: Patient seen and examined in the stepdown unit.  He was sitting in the chair but he was very fidgety and had significant gross tremors in both upper and lower extremities.  He was alert and oriented x3.  Did not have any complaint.  Objective: Vitals:   12/02/18 0500 12/02/18 0600 12/02/18 0700 12/02/18 0800  BP:  (!) 163/106 (!) 158/101 (!) 142/92  Pulse:  (!) 114 (!) 109 (!) 108  Resp:  20 15   Temp:    99 F (37.2 C)  TempSrc:    Oral  SpO2:  99% 99%   Weight: 92.1 kg     Height:        Intake/Output Summary (Last 24 hours) at 12/02/2018 0941 Last data filed at 12/02/2018 0800 Gross per 24 hour  Intake 0 ml  Output -  Net 0 ml   Filed Weights   11/30/18  0500 12/01/18 0500 12/02/18 0500  Weight: 94.4 kg 91.2 kg 92.1 kg    Examination:  General exam: Appears anxious but comfortable and has gross tremors in both upper and lower extremities Respiratory system: Clear to auscultation. Respiratory effort normal. Cardiovascular system: S1 & S2 heard, RRR. No JVD, murmurs, rubs, gallops or clicks. No pedal edema. Gastrointestinal system: Abdomen is nondistended, soft and nontender. No organomegaly or masses felt. Normal bowel sounds heard. Central nervous system: Alert and oriented. No focal neurological deficits. Extremities: Symmetric 5 x 5 power. Skin: No rashes, lesions or ulcers Psychiatry: Judgement and insight appear poor. Mood & affect appropriate.  Data Reviewed: I have personally reviewed following labs and imaging studies  CBC: Recent Labs  Lab 11/26/18 0812 12/01/18 0829 12/02/18 0312  WBC 10.1 11.3* 12.3*  NEUTROABS 7.1 8.8* 9.1*  HGB 13.7 13.7 13.1  HCT 41.8 41.1 40.6  MCV 103.5* 104.1* 105.2*  PLT 272 412* 442*   Basic Metabolic Panel: Recent Labs  Lab 12/01/18 0829 12/02/18 0312  NA 137 138  K 3.9 4.3  CL 103 102  CO2 22 23  GLUCOSE 145* 108*  BUN 14 16  CREATININE 0.84 0.87  CALCIUM 9.9 10.2  MG 2.3 2.3   GFR: Estimated Creatinine Clearance: 144.3 mL/min (by C-G formula based on SCr of 0.87 mg/dL). Liver Function Tests: Recent Labs  Lab 12/01/18 0829 12/02/18 0312  AST 28 33  ALT 43  55*  ALKPHOS 43 41  BILITOT 0.3 0.4  PROT 8.0 7.9  ALBUMIN 4.3 4.5   No results for input(s): LIPASE, AMYLASE in the last 168 hours. No results for input(s): AMMONIA in the last 168 hours. Coagulation Profile: No results for input(s): INR, PROTIME in the last 168 hours. Cardiac Enzymes: No results for input(s): CKTOTAL, CKMB, CKMBINDEX, TROPONINI in the last 168 hours. BNP (last 3 results) No results for input(s): PROBNP in the last 8760 hours. HbA1C: No results for input(s): HGBA1C in the last 72 hours. CBG:  No results for input(s): GLUCAP in the last 168 hours. Lipid Profile: No results for input(s): CHOL, HDL, LDLCALC, TRIG, CHOLHDL, LDLDIRECT in the last 72 hours. Thyroid Function Tests: No results for input(s): TSH, T4TOTAL, FREET4, T3FREE, THYROIDAB in the last 72 hours. Anemia Panel: No results for input(s): VITAMINB12, FOLATE, FERRITIN, TIBC, IRON, RETICCTPCT in the last 72 hours. Sepsis Labs: No results for input(s): PROCALCITON, LATICACIDVEN in the last 168 hours.  Recent Results (from the past 240 hour(s))  MRSA PCR Screening     Status: None   Collection Time: 11/24/18  2:00 AM  Result Value Ref Range Status   MRSA by PCR NEGATIVE NEGATIVE Final    Comment:        The GeneXpert MRSA Assay (FDA approved for NASAL specimens only), is one component of a comprehensive MRSA colonization surveillance program. It is not intended to diagnose MRSA infection nor to guide or monitor treatment for MRSA infections. Performed at [facility], [address], [place], [place] [address]   Culture, blood (routine x 2)     Status: None   Collection Time: 11/26/18  8:09 AM  Result Value Ref Range Status   Specimen Description   Final    BLOOD RIGHT ANTECUBITAL Performed at [facility], [address], [place], [place] [address]    Special Requests   Final    BOTTLES DRAWN AEROBIC AND ANAEROBIC Blood Culture adequate volume Performed at [facility], [address], [place], [place] [address]    Culture   Final    NO GROWTH 5 DAYS Performed at [facility], [address] [place], [place] [address]    Report Status 12/01/2018 FINAL  Final  Culture, blood (routine x 2)     Status: None   Collection Time: 11/26/18  8:11 AM  Result Value Ref Range Status   Specimen Description   Final    BLOOD RIGHT HAND Performed at [facility], [address] [place], [place] [address]    Special Requests    Final    BOTTLES DRAWN AEROBIC ONLY Blood Culture adequate volume Performed at [facility], [address] [place], [place] [address]    Culture   Final    NO GROWTH 5 DAYS Performed at [facility], [address], [place], [place] [address]    Report Status 12/01/2018 FINAL  Final  Culture, Urine     Status: None   Collection Time: 11/27/18  1:11 PM  Result Value Ref Range Status   Specimen Description   Final    URINE, CLEAN CATCH Performed at [facility], [address], [place], [place] [address]    Special Requests   Final    NONE Performed at [facility], [address] [place], [place] [address]    Culture   Final    NO GROWTH Performed  at [facility], [address] [place], [place] [address]    Report Status 11/28/2018 FINAL  Final      Radiology Studies: Dg Chest Port 1 View  Result Date: 12/02/2018 CLINICAL DATA:  F/u pneumonia EXAM: PORTABLE CHEST - 1 VIEW COMPARISON:  11/26/2018 FINDINGS: Lungs are clear. Heart size and mediastinal contours are within normal limits. Mild elevation of the left diaphragmatic leaflet. No effusion. Visualized bones unremarkable. IMPRESSION: No acute cardiopulmonary disease. Electronically Signed   By: Corlis Leak M.D.   On: 12/02/2018 08:09    Scheduled Meds: . amLODipine  5 mg Oral Daily  . carvedilol  6.25 mg Oral BID WC  . Chlorhexidine Gluconate Cloth  6 each Topical Daily  . docusate sodium  100 mg Oral BID  . famotidine  20 mg Oral BID  . feeding supplement (ENSURE ENLIVE)  237 mL Oral TID BM  . feeding supplement (PRO-STAT SUGAR FREE 64)  30 mL Oral BID  . folic acid  1 mg Oral Daily  . heparin  5,000 Units Subcutaneous Q8H  . hydrALAZINE  25 mg Oral TID  . magnesium oxide  400 mg Oral BID  . mouth rinse  15 mL Mouth Rinse BID  . multivitamin with minerals  1 tablet Oral Daily  . sodium chloride flush  3 mL Intravenous Q12H  . thiamine  100 mg  Oral Daily   Continuous Infusions:   LOS: 12 days   Assessment & Plan:   Principal Problem:   Alcohol use disorder, severe, dependence (HCC) Active Problems:   Alcoholic hepatitis without ascites   Alcohol withdrawal (HCC)   Alcohol abuse   Gastroesophageal reflux disease with esophagitis   Ataxia   HTN (hypertension)  Alcohol intoxication with alcohol withdrawal/delirium tremens: Patient has been off of Precedex drip since 11/28/2018.  He was very tremulous again today.  He has not received or required any Ativan since his CIWA has remained less than 9 for more than 24 hours now.  Continue CIWA protocol with as needed Ativan as well as multivitamins for now.  His gross tremors are likely due to his Wernicke's encephalopathy instead of withdrawal at this point in time.  Continue Seroquel.  Fever: Patient had fever low-grade once again yesterday and now has some leukocytosis as well.  Chest x-ray done which does not show any acute pathology.  Blood cultures repeated.  Will check urine analysis.  Hypertension/sinus tachycardia: Blood pressure remains elevated intermittently and he has remained tachycardic persistently despite of being on oral labetalol so at this point in time I will switch him to carvedilol 6.125 p.o. twice daily and will also start him on amlodipine 5 mg and continue PRN labetalol and hydralazine.  GERD: Continue Pepcid twice daily.  DVT prophylaxis: Heparin Code Status: Full code Family Communication: No family present at the bedside Disposition Plan: Remain in stepdown for close monitoring.  Time spent: 30 min   Hughie Closs, MD [facility] Pager [phone]  If 7PM-7AM, please contact night-coverage [email or web address] Password [facility] 12/02/2018, 9:41 AM

## 2018-12-03 LAB — CBC WITH DIFFERENTIAL/PLATELET
Abs Immature Granulocytes: 0.06 10*3/uL (ref 0.00–0.07)
Basophils Absolute: 0 10*3/uL (ref 0.0–0.1)
Basophils Relative: 0 %
Eosinophils Absolute: 0.1 10*3/uL (ref 0.0–0.5)
Eosinophils Relative: 1 %
HCT: 38.9 % — ABNORMAL LOW (ref 39.0–52.0)
Hemoglobin: 12.8 g/dL — ABNORMAL LOW (ref 13.0–17.0)
Immature Granulocytes: 1 %
Lymphocytes Relative: 16 %
Lymphs Abs: 1.9 10*3/uL (ref 0.7–4.0)
MCH: 34.1 pg — ABNORMAL HIGH (ref 26.0–34.0)
MCHC: 32.9 g/dL (ref 30.0–36.0)
MCV: 103.7 fL — ABNORMAL HIGH (ref 80.0–100.0)
Monocytes Absolute: 1.1 10*3/uL — ABNORMAL HIGH (ref 0.1–1.0)
Monocytes Relative: 9 %
Neutro Abs: 8.4 10*3/uL — ABNORMAL HIGH (ref 1.7–7.7)
Neutrophils Relative %: 73 %
Platelets: 437 10*3/uL — ABNORMAL HIGH (ref 150–400)
RBC: 3.75 MIL/uL — ABNORMAL LOW (ref 4.22–5.81)
RDW: 12.3 % (ref 11.5–15.5)
WBC: 11.4 10*3/uL — ABNORMAL HIGH (ref 4.0–10.5)
nRBC: 0 % (ref 0.0–0.2)

## 2018-12-03 MED ORDER — ENALAPRILAT 1.25 MG/ML IV SOLN
1.2500 mg | Freq: Once | INTRAVENOUS | Status: AC
  Administered 2018-12-03: 1.25 mg via INTRAVENOUS
  Filled 2018-12-03: qty 1

## 2018-12-03 MED ORDER — CARVEDILOL 12.5 MG PO TABS
12.5000 mg | ORAL_TABLET | Freq: Two times a day (BID) | ORAL | Status: DC
  Administered 2018-12-03 (×2): 12.5 mg via ORAL
  Filled 2018-12-03 (×2): qty 1

## 2018-12-03 NOTE — Progress Notes (Signed)
Mostly oriented over night shift but started getting a little confused around 0300, easily reoriented.  Patient very weak with tremors.  Tremors hinder patient from being able to ambulate safely.  When patient used walker and stand by assist by RN this morning to stand and pivot to [facility] this morning, patient was scared and extremely unsteady on his feet.  Diaphoretic from transferring from bed to [facility] and heart rate went up to 150's Stach.  Patient did not sleep at all overnight.  Cooperative and follows commands.

## 2018-12-03 NOTE — Progress Notes (Signed)
Blount, NP returned [employer] from [employer].  Made NP aware of BP 181/102 that persists after hydralazine and labetalol was given.  NP stated she would look at patient's chart and order something.

## 2018-12-03 NOTE — Progress Notes (Signed)
PROGRESS NOTE    Brett Molina  GMW:[id number] DOB: Feb 28, 1991 DOA: 11/19/2018 PCP: Patient, No Pcp Per   Brief Narrative:  Brett Molina is a 28 year old male with alcoholism and currently with heavy binge drinking, history of hypertension who presented to the emergency room with intoxication and unable to ambulate without assistance. In the ER he was intoxicated with alcohol level of 411. He was also found to be severely dehydrated. Treated in the emergency room and found to be ataxic to ambulate and with alcohol withdrawal so admitted to the hospital.  He was transferred to stepdown unit overnight on 5/20-5/21 due to increasing CIWA scale and worsening disorientation, hallucinations. Overnight 5/24-5/25, he continued to worsen, was moved to ICU for precedex drip which was eventually stopped on 11/28/2018.  On review of his admission in June 2018, his prolonged hospital course was as follows: 6/9 admit 6/11onprecedex 6/15 off precedexand out of ICU 6/17back on precedex 6/19off precedex and out of ICU 6/20 intermittent hallucinations and delirium 6/24 discharge to inpatient psych facility  Consultants:   Psychiatry  Neurology  Procedures:   None  Antimicrobials:   None   Subjective: Seen and examined.  Has no complaints.  Feels better.  Less tremulousness compared to yesterday.  Objective: Vitals:   12/03/18 0945 12/03/18 1131 12/03/18 1138 12/03/18 1140  BP: (!) 157/115 (!) 147/106 (!) 128/106   Pulse: (!) 123 (!) 116 (!) 127   Resp: (!) 25 20 (!) 22   Temp:    99.2 F (37.3 C)  TempSrc:    Oral  SpO2: 100% 95% 100%   Weight:      Height:        Intake/Output Summary (Last 24 hours) at 12/03/2018 1403 Last data filed at 12/03/2018 0126 Gross per 24 hour  Intake 400 ml  Output 650 ml  Net -250 ml   Filed Weights   11/30/18 0500 12/01/18 0500 12/02/18 0500  Weight: 94.4 kg 91.2 kg 92.1 kg    Examination:  General exam: Appears calm  and comfortable, gross tremors upper extremities and lower extremities Respiratory system: Clear to auscultation. Respiratory effort normal. Cardiovascular system: S1 & S2 heard, RRR. No JVD, murmurs, rubs, gallops or clicks. No pedal edema. Gastrointestinal system: Abdomen is nondistended, soft and nontender. No organomegaly or masses felt. Normal bowel sounds heard. Central nervous system: Alert and oriented. No focal neurological deficits. Extremities: Symmetric 5 x 5 power. Skin: No rashes, lesions or ulcers Psychiatry: Judgement and insight appear poor, mood & affect appropriate.  Data Reviewed: I have personally reviewed following labs and imaging studies  CBC: Recent Labs  Lab 12/01/18 0829 12/02/18 0312 12/03/18 0309  WBC 11.3* 12.3* 11.4*  NEUTROABS 8.8* 9.1* 8.4*  HGB 13.7 13.1 12.8*  HCT 41.1 40.6 38.9*  MCV 104.1* 105.2* 103.7*  PLT 412* 442* 437*   Basic Metabolic Panel: Recent Labs  Lab 12/01/18 0829 12/02/18 0312  NA 137 138  K 3.9 4.3  CL 103 102  CO2 22 23  GLUCOSE 145* 108*  BUN 14 16  CREATININE 0.84 0.87  CALCIUM 9.9 10.2  MG 2.3 2.3   GFR: Estimated Creatinine Clearance: 144.3 mL/min (by C-G formula based on SCr of 0.87 mg/dL). Liver Function Tests: Recent Labs  Lab 12/01/18 0829 12/02/18 0312  AST 28 33  ALT 43 55*  ALKPHOS 43 41  BILITOT 0.3 0.4  PROT 8.0 7.9  ALBUMIN 4.3 4.5   No results for input(s): LIPASE, AMYLASE in the last 168  hours. No results for input(s): AMMONIA in the last 168 hours. Coagulation Profile: No results for input(s): INR, PROTIME in the last 168 hours. Cardiac Enzymes: No results for input(s): CKTOTAL, CKMB, CKMBINDEX, TROPONINI in the last 168 hours. BNP (last 3 results) No results for input(s): PROBNP in the last 8760 hours. HbA1C: No results for input(s): HGBA1C in the last 72 hours. CBG: No results for input(s): GLUCAP in the last 168 hours. Lipid Profile: No results for input(s): CHOL, HDL, LDLCALC,  TRIG, CHOLHDL, LDLDIRECT in the last 72 hours. Thyroid Function Tests: No results for input(s): TSH, T4TOTAL, FREET4, T3FREE, THYROIDAB in the last 72 hours. Anemia Panel: No results for input(s): VITAMINB12, FOLATE, FERRITIN, TIBC, IRON, RETICCTPCT in the last 72 hours. Sepsis Labs: No results for input(s): PROCALCITON, LATICACIDVEN in the last 168 hours.  Recent Results (from the past 240 hour(s))  MRSA PCR Screening     Status: None   Collection Time: 11/24/18  2:00 AM  Result Value Ref Range Status   MRSA by PCR NEGATIVE NEGATIVE Final    Comment:        The GeneXpert MRSA Assay (FDA approved for NASAL specimens only), is one component of a comprehensive MRSA colonization surveillance program. It is not intended to diagnose MRSA infection nor to guide or monitor treatment for MRSA infections. Performed at [facility], [address], [place], [place] [address]   Culture, blood (routine x 2)     Status: None   Collection Time: 11/26/18  8:09 AM  Result Value Ref Range Status   Specimen Description   Final    BLOOD RIGHT ANTECUBITAL Performed at [facility], [address], [place], [place] [address]    Special Requests   Final    BOTTLES DRAWN AEROBIC AND ANAEROBIC Blood Culture adequate volume Performed at [facility], [address] [place], [place] [address]    Culture   Final    NO GROWTH 5 DAYS Performed at [facility], [address] [place], [place] [address]    Report Status 12/01/2018 FINAL  Final  Culture, blood (routine x 2)     Status: None   Collection Time: 11/26/18  8:11 AM  Result Value Ref Range Status   Specimen Description   Final    BLOOD RIGHT HAND Performed at [facility], [address] [place], [place] [address]    Special Requests   Final    BOTTLES DRAWN AEROBIC ONLY Blood Culture adequate volume Performed at [facility], [address], [place], [place] [address]    Culture   Final    NO GROWTH 5 DAYS Performed at [facility], [address] [place], [place] [address]    Report Status 12/01/2018 FINAL  Final  Culture, Urine     Status: None   Collection Time: 11/27/18  1:11 PM  Result Value Ref Range Status   Specimen Description   Final    URINE, CLEAN CATCH Performed at [facility], [address], [place], [place] [address]    Special Requests   Final    NONE Performed at [facility], [address], [place], [place] [address]    Culture   Final    NO GROWTH Performed at [facility], [address], [place], [place] [address]    Report Status 11/28/2018 FINAL  Final  Culture, blood (routine x 2)  Status: None (Preliminary result)   Collection Time: 12/02/18  8:54 AM  Result Value Ref Range Status   Specimen Description   Final    BLOOD BLOOD RIGHT HAND Performed at [facility], [address] [place], [place] [address]    Special Requests   Final    BOTTLES DRAWN AEROBIC AND ANAEROBIC Blood Culture adequate volume Performed at [facility], [address] [place], [place] [address]    Culture   Final    NO GROWTH 1 DAY Performed at [facility], [address] [place], [place] [address]    Report Status PENDING  Incomplete  Culture, blood (routine x 2)     Status: None (Preliminary result)   Collection Time: 12/02/18  8:54 AM  Result Value Ref Range Status   Specimen Description   Final    BLOOD LEFT ANTECUBITAL Performed at [facility], [address] [place], [place] [address]    Special Requests   Final    BOTTLES DRAWN AEROBIC ONLY Blood Culture results may not be optimal due to an inadequate volume of blood received in culture bottles Performed at [facility], [address] [place], [place] [address]    Culture   Final    NO GROWTH 1  DAY Performed at [facility], [address] [place], [place] [address]    Report Status PENDING  Incomplete      Radiology Studies: Dg Chest Port 1 View  Result Date: 12/02/2018 CLINICAL DATA:  F/u pneumonia EXAM: PORTABLE CHEST - 1 VIEW COMPARISON:  11/26/2018 FINDINGS: Lungs are clear. Heart size and mediastinal contours are within normal limits. Mild elevation of the left diaphragmatic leaflet. No effusion. Visualized bones unremarkable. IMPRESSION: No acute cardiopulmonary disease. Electronically Signed   By: Corlis Leak  Hassell M.D.   On: 12/02/2018 08:09    Scheduled Meds: . amLODipine  5 mg Oral Daily  . carvedilol  12.5 mg Oral BID WC  . Chlorhexidine Gluconate Cloth  6 each Topical Daily  . docusate sodium  100 mg Oral BID  . famotidine  20 mg Oral BID  . feeding supplement (ENSURE ENLIVE)  237 mL Oral TID BM  . feeding supplement (PRO-STAT SUGAR FREE 64)  30 mL Oral BID  . folic acid  1 mg Oral Daily  . heparin  5,000 Units Subcutaneous Q8H  . hydrALAZINE  25 mg Oral TID  . magnesium oxide  400 mg Oral BID  . mouth rinse  15 mL Mouth Rinse BID  . multivitamin with minerals  1 tablet Oral Daily  . sodium chloride flush  3 mL Intravenous Q12H  . thiamine  100 mg Oral Daily   Continuous Infusions:   LOS: 13 days   Assessment & Plan:   Principal Problem:   Alcohol use disorder, severe, dependence (HCC) Active Problems:   Alcoholic hepatitis without ascites   Alcohol withdrawal (HCC)   Alcohol abuse   Gastroesophageal reflux disease with esophagitis   Ataxia   HTN (hypertension)  Alcohol intoxication with alcohol withdrawal/delirium tremens: Patient has been off of Precedex drip since 11/28/2018.  He was very tremulous again today.  He has not received or required any Ativan since his CIWA has remained less than 8 for more than 24 hours now.  Continue CIWA protocol with as needed Ativan as well as multivitamins for now.  His gross tremors are likely due to his  Wernicke's encephalopathy instead of withdrawal  at this point in time.  Continue Seroquel.  Fever: Afebrile for last 24 hours.  Had low-grade fever day before yesterday.  UA and chest x-ray negative.  Blood culture remain negative so far.  Continue to watch and hold off 20 antibiotics.  Hypertension/sinus tachycardia: Blood pressure remains elevated intermittently and he has remained tachycardic persistently.  Will increase carvedilol to 12.5 mg twice daily and continue rest of the management.  GERD: Continue Pepcid twice daily.  DVT prophylaxis: Heparin Code Status: Full code Family Communication: No family present at the bedside Disposition Plan: Remain in stepdown for close monitoring.  Time spent: 28 minutes  Hughie Closs, MD [facility] Pager [phone]  If 7PM-7AM, please contact night-coverage [email or web address] Password [email or web address] 12/03/2018, 2:03 PM

## 2018-12-03 NOTE — Progress Notes (Signed)
This nurse received this patient as a transfer from ICU. I agree with the previous nurse's assessment. Will continue to monitor.

## 2018-12-04 LAB — CBC WITH DIFFERENTIAL/PLATELET
Abs Immature Granulocytes: 0.04 10*3/uL (ref 0.00–0.07)
Basophils Absolute: 0 10*3/uL (ref 0.0–0.1)
Basophils Relative: 0 %
Eosinophils Absolute: 0 10*3/uL (ref 0.0–0.5)
Eosinophils Relative: 0 %
HCT: 41.6 % (ref 39.0–52.0)
Hemoglobin: 13.8 g/dL (ref 13.0–17.0)
Immature Granulocytes: 0 %
Lymphocytes Relative: 13 %
Lymphs Abs: 1.5 10*3/uL (ref 0.7–4.0)
MCH: 33.4 pg (ref 26.0–34.0)
MCHC: 33.2 g/dL (ref 30.0–36.0)
MCV: 100.7 fL — ABNORMAL HIGH (ref 80.0–100.0)
Monocytes Absolute: 1.1 10*3/uL — ABNORMAL HIGH (ref 0.1–1.0)
Monocytes Relative: 10 %
Neutro Abs: 8.5 10*3/uL — ABNORMAL HIGH (ref 1.7–7.7)
Neutrophils Relative %: 77 %
Platelets: 437 10*3/uL — ABNORMAL HIGH (ref 150–400)
RBC: 4.13 MIL/uL — ABNORMAL LOW (ref 4.22–5.81)
RDW: 12.2 % (ref 11.5–15.5)
WBC: 11.1 10*3/uL — ABNORMAL HIGH (ref 4.0–10.5)
nRBC: 0 % (ref 0.0–0.2)

## 2018-12-04 LAB — COMPREHENSIVE METABOLIC PANEL
ALT: 43 U/L (ref 0–44)
AST: 22 U/L (ref 15–41)
Albumin: 4.5 g/dL (ref 3.5–5.0)
Alkaline Phosphatase: 46 U/L (ref 38–126)
Anion gap: 12 (ref 5–15)
BUN: 13 mg/dL (ref 6–20)
CO2: 23 mmol/L (ref 22–32)
Calcium: 10.1 mg/dL (ref 8.9–10.3)
Chloride: 101 mmol/L (ref 98–111)
Creatinine, Ser: 0.81 mg/dL (ref 0.61–1.24)
GFR calc Af Amer: >60 mL/min (ref >60)
GFR calc non Af Amer: >60 mL/min (ref >60)
Glucose, Bld: 96 mg/dL (ref 70–99)
Potassium: 3.9 mmol/L (ref 3.5–5.1)
Sodium: 136 mmol/L (ref 135–145)
Total Bilirubin: 0.6 mg/dL (ref 0.3–1.2)
Total Protein: 8.2 g/dL — ABNORMAL HIGH (ref 6.5–8.1)

## 2018-12-04 LAB — MAGNESIUM: Magnesium: 2.2 mg/dL (ref 1.7–2.4)

## 2018-12-04 MED ORDER — CARVEDILOL 25 MG PO TABS
25.0000 mg | ORAL_TABLET | Freq: Two times a day (BID) | ORAL | Status: DC
  Administered 2018-12-04 – 2018-12-06 (×4): 25 mg via ORAL
  Filled 2018-12-04 (×4): qty 1

## 2018-12-04 MED ORDER — AMLODIPINE BESYLATE 10 MG PO TABS
10.0000 mg | ORAL_TABLET | Freq: Every day | ORAL | Status: DC
  Administered 2018-12-04 – 2018-12-07 (×4): 10 mg via ORAL
  Filled 2018-12-04 (×4): qty 1

## 2018-12-04 NOTE — Progress Notes (Signed)
Occupational Therapy Treatment Patient Details Name: Brett Molina MRN: [id number] DOB: March 01, 1991 Today's Date: 12/04/2018    History of present illness Brett Molina is a 28 y.o. male with PHMx: th medical history significant of   hypertension, GERD, chronic alcohol abuse, history of upper GI, and depression presented to the ED on 5/16 in  alcohol intoxication state.   OT comments  Pt progressing toward stated goals, hallucinating this date. Upon OT entry, pt sitting up in bed stating "well I was just going to go to the bathroom". Assisted pt to the BR at min-mod A for safety and management of impulsivity. Pt moves very quickly without any safety planning and is rather unstable with tremors, needs increased time to respond to one step VC's. Once in BR pt needing cues for sequencing. He was stating how he was "at the protests last night and spent $100 on a bottle for no reason". Assisted pt to chair again at min-mod A for safety and steadying. Pt started to hallucinate small yorkeys on the ground, and told OT his phone broke because of the chlorine in the air and continued making other nonsensical statements. Pt stayed up in chair with alarm set and NT/RN notified due to safety concerns. He attempted to pick up remote, remote was not in hand, and he stated he did not know how to work it, even though remote was not in his hand. RN stated he has issues with this when feeding as well. Will continue to follow per OT POC. D/c plan remains appropriate.   Follow Up Recommendations  Supervision/Assistance - 24 hour(HHOT if medicaid approves)    Equipment Recommendations  3 in 1 bedside commode;Tub/shower bench    Recommendations for Other Services      Precautions / Restrictions Precautions Precautions: Fall Precaution Comments: monitor HR Restrictions Weight Bearing Restrictions: No       Mobility Bed Mobility               General bed mobility comments: sitting up in  bed  Transfers Overall transfer level: Needs assistance Equipment used: Rolling walker (2 wheeled) Transfers: Sit to/from Stand Sit to Stand: Min assist;Mod assist         General transfer comment: VC's for hand placement; min-mod A for steadying and management of impulsive movements    Balance Overall balance assessment: Needs assistance Sitting-balance support: No upper extremity supported;Feet supported Sitting balance-Leahy Scale: Fair Sitting balance - Comments: sits EOB   Standing balance support: Bilateral upper extremity supported;During functional activity Standing balance-Leahy Scale: Poor Standing balance comment: reliant on UE support and OT for safety and steadying                           ADL either performed or assessed with clinical judgement   ADL Overall ADL's : Needs assistance/impaired                         Toilet Transfer: Moderate assistance;Cueing for safety;Cueing for sequencing;RW;Ambulation;Regular Teacher, adult education Details (indicate cue type and reason): mod A for safety and control of t/f; pt very impulsive, quick, and ataxic in movements Toileting- Clothing Manipulation and Hygiene: Moderate assistance;Sitting/lateral lean;Sit to/from stand Toileting - Clothing Manipulation Details (indicate cue type and reason): for thorough job; dropped on floor and half in water       General ADL Comments: pt completed toilet t/f, grooming, and t/f to chair this date  Vision Patient Visual Report: No change from baseline     Perception     Praxis      Cognition Arousal/Alertness: Awake/alert Behavior During Therapy: Impulsive   Area of Impairment: Safety/judgement;Following commands;Orientation;Attention                 Orientation Level: Disoriented to;Time;Situation Current Attention Level: Sustained Memory: Decreased short-term memory Following Commands: Follows one step commands with increased  time Safety/Judgement: Decreased awareness of safety;Decreased awareness of deficits   Problem Solving: Requires verbal cues;Requires tactile cues;Difficulty sequencing;Slow processing General Comments: was hallucinating small yorkeys this date; believes he went to [place] and spent [place]100 on alcohol; slow processing to VC's; trying to change channel on TV without control in his hand        Exercises     Shoulder Instructions       General Comments      Pertinent Vitals/ Pain       Pain Assessment: No/denies pain  Home Living                                          Prior Functioning/Environment              Frequency  Min 2X/week        Progress Toward Goals  OT Goals(current goals can now be found in the care plan section)  Progress towards OT goals: Progressing toward goals  Acute Rehab OT Goals Patient Stated Goal: walk like before, play and watch sports OT Goal Formulation: With patient Time For Goal Achievement: 11/21/18 Potential to Achieve Goals: Good  Plan Discharge plan remains appropriate    Co-evaluation                 AM-PAC OT "6 Clicks" Daily Activity     Outcome Measure   Help from another person eating meals?: None Help from another person taking care of personal grooming?: A Little Help from another person toileting, which includes using toliet, bedpan, or urinal?: A Lot Help from another person bathing (including washing, rinsing, drying)?: A Lot Help from another person to put on and taking off regular upper body clothing?: A Lot Help from another person to put on and taking off regular lower body clothing?: A Lot 6 Click Score: 15    End of Session Equipment Utilized During Treatment: Gait belt;Rolling walker  OT Visit Diagnosis: Unsteadiness on feet (R26.81);Other abnormalities of gait and mobility (R26.89);Muscle weakness (generalized) (M62.81);Ataxia, unspecified (R27.0)    Activity Tolerance Patient tolerated treatment well   Patient Left in chair;with call bell/phone within reach;with chair alarm set   Nurse Communication Mobility status        Time: 8841-6606 OT Time Calculation (min): 17 min  Charges: OT General Charges $OT Visit: 1 Visit OT Treatments $Self Care/Home Management : 8-22 mins  Dalphine Handing, MSOT, OTR/L Behavioral Health OT/ Acute Relief OT [facility] Office: [phone]  Dalphine Handing 12/04/2018, 3:50 PM

## 2018-12-04 NOTE — Progress Notes (Signed)
PROGRESS NOTE    Brett DredgeDerek Terrel Molina  QMV:[id number] DOB: [birth year] DOA: 11/19/2018 PCP: Patient, No Pcp Per   Brief Narrative:  Brett Dredgeerek Terrel Molina is a 28 year old male with alcoholism and currently with heavy binge drinking, history of hypertension who presented to the emergency room with intoxication and unable to ambulate without assistance. In the ER he was intoxicated with alcohol level of 411. He was also found to be severely dehydrated. Treated in the emergency room and found to be ataxic to ambulate and with alcohol withdrawal so admitted to the hospital.  He was transferred to stepdown unit overnight on 5/20-5/21 due to increasing CIWA scale and worsening disorientation, hallucinations. Overnight 5/24-5/25, he continued to worsen, was moved to ICU for precedex drip which was eventually stopped on 11/28/2018.  On review of his admission in June 2018, his prolonged hospital course was as follows: 6/9 admit 6/11onprecedex 6/15 off precedexand out of ICU 6/17back on precedex 6/19off precedex and out of ICU 6/20 intermittent hallucinations and delirium 6/24 discharge to inpatient psych facility  Consultants:   Psychiatry  Neurology  Procedures:   None  Antimicrobials:   None   Subjective: Seen and examined.  Has no complaints.  Again is tremulous in both upper and lower extremities at his baseline likely secondary to Wernicke's encephalopathy.  Objective: Vitals:   12/04/18 0214 12/04/18 0538 12/04/18 0600 12/04/18 0943  BP: (!) 133/92 (!) 187/99 (!) 187/99 (!) 116/102  Pulse: (!) 124 (!) 112 (!) 112 (!) 120  Resp: 18 18  16   Temp: 99.7 F (37.6 C) 100.2 F (37.9 C)  98.9 F (37.2 C)  TempSrc: Oral Oral  Oral  SpO2: 92% 99%  100%  Weight:  89.7 kg    Height:        Intake/Output Summary (Last 24 hours) at 12/04/2018 1008 Last data filed at 12/04/2018 0000 Gross per 24 hour  Intake 243 ml  Output 175 ml  Net 68 ml   Filed Weights   12/01/18  0500 12/02/18 0500 12/04/18 0538  Weight: 91.2 kg 92.1 kg 89.7 kg    Examination: General exam: Appears calm and comfortable gross tremors upper extremities and lower extremities bilaterally Respiratory system: Clear to auscultation. Respiratory effort normal. Cardiovascular system: S1 & S2 heard, RRR. No JVD, murmurs, rubs, gallops or clicks. No pedal edema. Gastrointestinal system: Abdomen is nondistended, soft and nontender. No organomegaly or masses felt. Normal bowel sounds heard. Central nervous system: Alert and oriented. No focal neurological deficits. Extremities: Symmetric 5 x 5 power. Skin: No rashes, lesions or ulcers Psychiatry: Judgement and insight appear poor. Mood & affect appropriate.  Data Reviewed: I have personally reviewed following labs and imaging studies  CBC: Recent Labs  Lab 12/01/18 0829 12/02/18 0312 12/03/18 0309 12/04/18 0409  WBC 11.3* 12.3* 11.4* 11.1*  NEUTROABS 8.8* 9.1* 8.4* 8.5*  HGB 13.7 13.1 12.8* 13.8  HCT 41.1 40.6 38.9* 41.6  MCV 104.1* 105.2* 103.7* 100.7*  PLT 412* 442* 437* 437*   Basic Metabolic Panel: Recent Labs  Lab 12/01/18 0829 12/02/18 0312  NA 137 138  K 3.9 4.3  CL 103 102  CO2 22 23  GLUCOSE 145* 108*  BUN 14 16  CREATININE 0.84 0.87  CALCIUM 9.9 10.2  MG 2.3 2.3   GFR: Estimated Creatinine Clearance: 144.3 mL/min (by C-G formula based on SCr of 0.87 mg/dL). Liver Function Tests: Recent Labs  Lab 12/01/18 0829 12/02/18 0312  AST 28 33  ALT 43 55*  ALKPHOS 43 41  BILITOT 0.3 0.4  PROT 8.0 7.9  ALBUMIN 4.3 4.5   No results for input(s): LIPASE, AMYLASE in the last 168 hours. No results for input(s): AMMONIA in the last 168 hours. Coagulation Profile: No results for input(s): INR, PROTIME in the last 168 hours. Cardiac Enzymes: No results for input(s): CKTOTAL, CKMB, CKMBINDEX, TROPONINI in the last 168 hours. BNP (last 3 results) No results for input(s): PROBNP in the last 8760 hours. HbA1C: No  results for input(s): HGBA1C in the last 72 hours. CBG: No results for input(s): GLUCAP in the last 168 hours. Lipid Profile: No results for input(s): CHOL, HDL, LDLCALC, TRIG, CHOLHDL, LDLDIRECT in the last 72 hours. Thyroid Function Tests: No results for input(s): TSH, T4TOTAL, FREET4, T3FREE, THYROIDAB in the last 72 hours. Anemia Panel: No results for input(s): VITAMINB12, FOLATE, FERRITIN, TIBC, IRON, RETICCTPCT in the last 72 hours. Sepsis Labs: No results for input(s): PROCALCITON, LATICACIDVEN in the last 168 hours.  Recent Results (from the past 240 hour(s))  Culture, blood (routine x 2)     Status: None   Collection Time: 11/26/18  8:09 AM  Result Value Ref Range Status   Specimen Description   Final    BLOOD RIGHT ANTECUBITAL Performed at [facility], [address] [place], [place] [address]    Special Requests   Final    BOTTLES DRAWN AEROBIC AND ANAEROBIC Blood Culture adequate volume Performed at [facility], [address], [place], [place] [address]    Culture   Final    NO GROWTH 5 DAYS Performed at [facility], [address], [place], [place] [address]    Report Status 12/01/2018 FINAL  Final  Culture, blood (routine x 2)     Status: None   Collection Time: 11/26/18  8:11 AM  Result Value Ref Range Status   Specimen Description   Final    BLOOD RIGHT HAND Performed at [facility], [address] [place], [place] [address]    Special Requests   Final    BOTTLES DRAWN AEROBIC ONLY Blood Culture adequate volume Performed at [facility], [address] [place], [place] [address]    Culture   Final    NO GROWTH 5 DAYS Performed at [facility], [address] [place], [place] [address]    Report Status 12/01/2018 FINAL  Final  Culture, Urine     Status: None   Collection Time: 11/27/18  1:11 PM  Result Value Ref Range Status   Specimen Description   Final     URINE, CLEAN CATCH Performed at [facility], [address] [place], [place] [address]    Special Requests   Final    NONE Performed at [facility], [address] [place], [place] [address]    Culture   Final    NO GROWTH Performed at [facility], [address], [place], [place] [address]    Report Status 11/28/2018 FINAL  Final  Culture, blood (routine x 2)     Status: None (Preliminary result)   Collection Time: 12/02/18  8:54 AM  Result Value Ref Range Status   Specimen Description   Final    BLOOD BLOOD RIGHT HAND Performed at [facility], [address], [place], [place] [address]    Special Requests   Final    BOTTLES DRAWN AEROBIC AND ANAEROBIC Blood Culture adequate volume Performed at [facility], [address], [place], [place] [address]    Culture   Final    NO GROWTH 1 DAY Performed at [facility], [address] [place], [place] [address]    Report Status PENDING  Incomplete  Culture, blood (routine x 2)     Status: None (Preliminary result)   Collection Time: 12/02/18  8:54 AM  Result Value Ref Range Status   Specimen Description   Final    BLOOD LEFT ANTECUBITAL Performed at [facility], [address], [place], [place] [address]    Special Requests   Final    BOTTLES DRAWN AEROBIC ONLY Blood Culture results may not be optimal due to an inadequate volume of blood received in culture bottles Performed at [facility], [address], [place], [place] [address]    Culture   Final    NO GROWTH 1 DAY Performed at [facility], [address] [place], [place] [address]    Report Status PENDING  Incomplete      Radiology Studies: No results found.  Scheduled Meds: . amLODipine  10 mg Oral Daily  . carvedilol  25 mg Oral BID WC  . docusate sodium  100 mg Oral BID  . famotidine  20 mg Oral BID  . feeding  supplement (ENSURE ENLIVE)  237 mL Oral TID BM  . feeding supplement (PRO-STAT SUGAR FREE 64)  30 mL Oral BID  . folic acid  1 mg Oral Daily  . heparin  5,000 Units Subcutaneous Q8H  . hydrALAZINE  25 mg Oral TID  . magnesium oxide  400 mg Oral BID  . mouth rinse  15 mL Mouth Rinse BID  . multivitamin with minerals  1 tablet Oral Daily  . sodium chloride flush  3 mL Intravenous Q12H  . thiamine  100 mg Oral Daily   Continuous Infusions:   LOS: 14 days   Assessment & Plan:   Principal Problem:   Alcohol use disorder, severe, dependence (HCC) Active Problems:   Alcoholic hepatitis without ascites   Alcohol withdrawal (HCC)   Alcohol abuse   Gastroesophageal reflux disease with esophagitis   Ataxia   HTN (hypertension)  Alcohol intoxication with alcohol withdrawal/delirium tremens: Patient has been off of Precedex drip since 11/28/2018.  He was very tremulous again today, this seems to be now his baseline due to his Wernicke's encephalopathy.  This is also altering his CIWA scale which has been up to 10 but I wonder if this is because of his tremors secondary to Wernicke's encephalopathy and not real alcohol withdrawals at this point in time I will discontinue CIWA protocol and PRN Ativan to avoid unnecessary medications.  Continue Seroquel.  Fever: Had low-grade fever once again last evening.  All the cultures, UA and chest x-ray have been negative.  Will consider ID consultation if he has fever again.  No signs of infection.  Hypertension/sinus tachycardia: Blood pressure still elevated but slightly better.  Will increase carvedilol to 25 mg twice daily and increase amlodipine to 10 mg.  GERD: Continue Pepcid twice daily.  DVT prophylaxis: Heparin Code Status: Full code Family Communication: No family present at the bedside Disposition Plan: Remain in stepdown for close monitoring.  Time spent: 26 minutes  Hughie Closs, MD [facility] Pager [phone]  If 7PM-7AM,  please contact night-coverage [email or web address] Password [email or web address] 12/04/2018, 10:08 AM

## 2018-12-05 LAB — COMPREHENSIVE METABOLIC PANEL
ALT: 40 U/L (ref 0–44)
AST: 28 U/L (ref 15–41)
Albumin: 4.6 g/dL (ref 3.5–5.0)
Alkaline Phosphatase: 46 U/L (ref 38–126)
Anion gap: 10 (ref 5–15)
BUN: 13 mg/dL (ref 6–20)
CO2: 24 mmol/L (ref 22–32)
Calcium: 10 mg/dL (ref 8.9–10.3)
Chloride: 100 mmol/L (ref 98–111)
Creatinine, Ser: 0.79 mg/dL (ref 0.61–1.24)
GFR calc Af Amer: >60 mL/min (ref >60)
GFR calc non Af Amer: >60 mL/min (ref >60)
Glucose, Bld: 108 mg/dL — ABNORMAL HIGH (ref 70–99)
Potassium: 4.1 mmol/L (ref 3.5–5.1)
Sodium: 134 mmol/L — ABNORMAL LOW (ref 135–145)
Total Bilirubin: 0.6 mg/dL (ref 0.3–1.2)
Total Protein: 8.3 g/dL — ABNORMAL HIGH (ref 6.5–8.1)

## 2018-12-05 LAB — CBC WITH DIFFERENTIAL/PLATELET
Abs Immature Granulocytes: 0.06 10*3/uL (ref 0.00–0.07)
Basophils Absolute: 0 10*3/uL (ref 0.0–0.1)
Basophils Relative: 0 %
Eosinophils Absolute: 0 10*3/uL (ref 0.0–0.5)
Eosinophils Relative: 0 %
HCT: 40.9 % (ref 39.0–52.0)
Hemoglobin: 13.3 g/dL (ref 13.0–17.0)
Immature Granulocytes: 0 %
Lymphocytes Relative: 13 %
Lymphs Abs: 1.9 10*3/uL (ref 0.7–4.0)
MCH: 33.6 pg (ref 26.0–34.0)
MCHC: 32.5 g/dL (ref 30.0–36.0)
MCV: 103.3 fL — ABNORMAL HIGH (ref 80.0–100.0)
Monocytes Absolute: 1.4 10*3/uL — ABNORMAL HIGH (ref 0.1–1.0)
Monocytes Relative: 10 %
Neutro Abs: 10.7 10*3/uL — ABNORMAL HIGH (ref 1.7–7.7)
Neutrophils Relative %: 77 %
Platelets: 463 10*3/uL — ABNORMAL HIGH (ref 150–400)
RBC: 3.96 MIL/uL — ABNORMAL LOW (ref 4.22–5.81)
RDW: 12.1 % (ref 11.5–15.5)
WBC: 14.2 10*3/uL — ABNORMAL HIGH (ref 4.0–10.5)
nRBC: 0 % (ref 0.0–0.2)

## 2018-12-05 MED ORDER — LISINOPRIL 10 MG PO TABS
10.0000 mg | ORAL_TABLET | Freq: Every day | ORAL | Status: DC
  Administered 2018-12-05 – 2018-12-07 (×3): 10 mg via ORAL
  Filled 2018-12-05 (×3): qty 1

## 2018-12-05 MED ORDER — HYDRALAZINE HCL 50 MG PO TABS
50.0000 mg | ORAL_TABLET | Freq: Three times a day (TID) | ORAL | Status: DC
  Administered 2018-12-05 – 2018-12-07 (×4): 50 mg via ORAL
  Filled 2018-12-05 (×7): qty 1

## 2018-12-05 NOTE — Progress Notes (Signed)
Physical Therapy Treatment Patient Details Name: Brett Molina MRN: [id number] DOB: May 11, 1991 Today's Date: 12/05/2018    History of Present Illness Brett Molina is a 28 y.o. male with PHMx: th medical history significant of   hypertension, GERD, chronic alcohol abuse, history of upper GI, and depression presented to the ED on 5/16 in  alcohol intoxication state.    PT Comments    Pt required + 2 assist to amb.  General transfer comment: VC's for hand placement; min-mod A for steadying and management of gross ataxic movements throughout. General Gait Details: VERY unsteady gait with gross ataxic movements throughout.  Excessive lean on walker.  HIGH FALL RISK.  Would advise pt NOT amb on his own. Distance limited by c/o dizziness and blurry vision.   Per chart review, pt does NOT want to go to Rehab at that his brother is willing to assist at home.    Follow Up Recommendations  Supervision/Assistance - 24 hour;SNF(Inpt [facility])     Equipment Recommendations  Rolling walker with 5" wheels    Recommendations for Other Services       Precautions / Restrictions Precautions Precautions: Fall Precaution Comments: monitor HR Restrictions Weight Bearing Restrictions: No    Mobility  Bed Mobility Overal bed mobility: Needs Assistance Bed Mobility: Supine to Sit     Supine to sit: Supervision     General bed mobility comments: increased time due to gross ataxia throughout   Transfers Overall transfer level: Needs assistance Equipment used: Rolling walker (2 wheeled) Transfers: Sit to/from [employer] Sit to Stand: Mod assist Stand pivot transfers: Mod assist;Max assist       General transfer comment: VC's for hand placement; min-mod A for steadying and management of gross ataxic movements throughout   Ambulation/Gait Ambulation/Gait assistance: Mod assist;+2 physical assistance;+2 safety/equipment Gait Distance (Feet): 35 Feet Assistive device:  Rolling walker (2 wheeled) Gait Pattern/deviations: Step-to pattern;Decreased step length - right;Decreased step length - left;Ataxic Gait velocity: decreased   General Gait Details: VERY unsteady gait with gross ataxic movements throughout.  Excessive lean on walker.  HIGH FALL RISK.  Would advise pt NOT amb on his own. Distance limited by c/o dizziness and blurry vision.     Stairs             Wheelchair Mobility    Modified Rankin (Stroke Patients Only)       Balance                                            Cognition Arousal/Alertness: Awake/alert Behavior During Therapy: WFL for tasks assessed/performed Overall Cognitive Status: Within Functional Limits for tasks assessed                                 General Comments: appears at prior cognitive level      Exercises      General Comments        Pertinent Vitals/Pain Pain Assessment: No/denies pain    Home Living                      Prior Function            PT Goals (current goals can now be found in the care plan section)      Frequency  Min 3X/week      PT Plan Current plan remains appropriate    Co-evaluation              AM-PAC PT "6 Clicks" Mobility   Outcome Measure  Help needed turning from your back to your side while in a flat bed without using bedrails?: A Lot Help needed moving from lying on your back to sitting on the side of a flat bed without using bedrails?: A Lot Help needed moving to and from a bed to a chair (including a wheelchair)?: A Lot Help needed standing up from a chair using your arms (e.g., wheelchair or bedside chair)?: A Lot Help needed to walk in hospital room?: A Lot Help needed climbing 3-5 steps with a railing? : Total 6 Click Score: 11    End of Session Equipment Utilized During Treatment: Gait belt Activity Tolerance: Patient limited by fatigue Patient left: in chair;with call bell/phone within  reach   PT Visit Diagnosis: Ataxic gait (R26.0);Other abnormalities of gait and mobility (R26.89)     Time: 1224-8250 PT Time Calculation (min) (ACUTE ONLY): 24 min  Charges:  $Gait Training: 8-22 mins $Therapeutic Activity: 8-22 mins                     Felecia Shelling  PTA Acute  Rehabilitation Services Pager      [phone] Office      [phone]

## 2018-12-05 NOTE — Progress Notes (Signed)
Patient's blood pressure elevated PRN hydralazine given. Also patient's CIWA score elevated, lots of termor's hallucinations and impulsive/agitation PRN ativan also given, will continue to assess patient.

## 2018-12-05 NOTE — TOC Transition Note (Signed)
Transition of Care [facility]) - CM/SW Discharge Note   Patient Details  Name: Brett Molina MRN: [id number] Date of Birth: 11-Oct-1990  Transition of Care [facility]) CM/SW Contact:  Brett Clam, RN Phone Number: 12/05/2018, 10:10 AM   Clinical Narrative: Patient w/hand shaking-unable to call girlfriend with phone.CM TC Girlfriend(Brett [phone]) per patient request-Spoke to patient in rm about d/c plans-he declines SNF, wants home. Frances Furbish is the [facility] agency-Brett Molina rep aware of d/c home & HHPT order. Patient needs RW-contacted [employer] rep Brett Molina for [facility] for rw-he will contact patient's girlfriend. Brett aware of [facility] to call for pcp appt, & pick up meds @ [facility] pharmacy, otpt AA resources,she will transport home. No further CM needs.      Final next level of care: Home w Home Health Services Barriers to Discharge: No Barriers Identified   Patient Goals and CMS Choice Patient states their goals for this hospitalization and ongoing recovery are:: to return home while making use of substance abuse resources CMS Medicare.gov Compare Post Acute Care list provided to:: Patient Represenative (must comment) Choice offered to / list presented to : (Girlfriend)  Discharge Placement                       Discharge Plan and Services In-house Referral: Clinical Social Work, [facility] Discharge Planning Services: CM Consult            DME Arranged: [name] rolling DME Agency: [employer] Date DME Agency Contacted: 12/05/18 Time DME Agency Contacted: 1009 Representative spoke with at DME Agency: Brett Molina HH Arranged: PT HH Agency: [facility] Date [facility] Agency Contacted: 12/05/18 Time HH Agency Contacted: 1009 Representative spoke with at [facility] Agency: Brett Molina  Social Determinants of Health (SDOH) Interventions     Readmission Risk Interventions No flowsheet data found.

## 2018-12-05 NOTE — Progress Notes (Signed)
PROGRESS NOTE    Brett Molina  IWP:[id number] DOB: 01-Apr-1991 DOA: 11/19/2018 PCP: Patient, No Pcp Per   Brief Narrative:  Brett Molina is a 28 year old male with alcoholism and currently with heavy binge drinking, history of hypertension who presented to the emergency room with intoxication and unable to ambulate without assistance. In the ER he was intoxicated with alcohol level of 411. He was also found to be severely dehydrated. Treated in the emergency room and found to be ataxic to ambulate and with alcohol withdrawal so admitted to the hospital.  He was transferred to stepdown unit overnight on 5/20-5/21 due to increasing CIWA scale and worsening disorientation, hallucinations. Overnight 5/24-5/25, he continued to worsen, was moved to ICU for precedex drip which was eventually stopped on 11/28/2018.  On review of his admission in June 2018, his prolonged hospital course was as follows: 6/9 admit 6/11onprecedex 6/15 off precedexand out of ICU 6/17back on precedex 6/19off precedex and out of ICU 6/20 intermittent hallucinations and delirium 6/24 discharge to inpatient psych facility  Consultants:   Psychiatry  Neurology  Procedures:   None  Antimicrobials:   None   Subjective: Patient seen and examined.  Again he has no complaints but he remains tremulous but slightly better than yesterday.  I discussed with him about rehabilitation however he prefers to go home with home health.  Objective: Vitals:   12/05/18 0522 12/05/18 0600 12/05/18 0900 12/05/18 1023  BP: (!) 166/109 (!) 169/105  128/60  Pulse: (!) 130 (!) 118 (!) 135 (!) 134  Resp: (!) 21 20    Temp:  98.5 F (36.9 C)  99.2 F (37.3 C)  TempSrc:  Oral  Oral  SpO2:      Weight:      Height:        Intake/Output Summary (Last 24 hours) at 12/05/2018 1024 Last data filed at 12/04/2018 1122 Gross per 24 hour  Intake 240 ml  Output 475 ml  Net -235 ml   Filed Weights   12/02/18  0500 12/04/18 0538 12/05/18 0500  Weight: 92.1 kg 89.7 kg 91.6 kg    Examination:  General exam: Appears calm and comfortable with gross tremors in both upper and lower extremities Respiratory system: Clear to auscultation. Respiratory effort normal. Cardiovascular system: S1 & S2 heard, RRR. No JVD, murmurs, rubs, gallops or clicks. No pedal edema. Gastrointestinal system: Abdomen is nondistended, soft and nontender. No organomegaly or masses felt. Normal bowel sounds heard. Central nervous system: Alert and oriented. No focal neurological deficits. Extremities: Symmetric 5 x 5 power. Skin: No rashes, lesions or ulcers Psychiatry: Judgement and insight appear poor. Mood & affect appropriate.  Data Reviewed: I have personally reviewed following labs and imaging studies  CBC: Recent Labs  Lab 12/01/18 0829 12/02/18 0312 12/03/18 0309 12/04/18 0409 12/05/18 0536  WBC 11.3* 12.3* 11.4* 11.1* 14.2*  NEUTROABS 8.8* 9.1* 8.4* 8.5* 10.7*  HGB 13.7 13.1 12.8* 13.8 13.3  HCT 41.1 40.6 38.9* 41.6 40.9  MCV 104.1* 105.2* 103.7* 100.7* 103.3*  PLT 412* 442* 437* 437* 463*   Basic Metabolic Panel: Recent Labs  Lab 12/01/18 0829 12/02/18 0312 12/04/18 0855 12/05/18 0536  NA 137 138 136 134*  K 3.9 4.3 3.9 4.1  CL 103 102 101 100  CO2 22 23 23 24   GLUCOSE 145* 108* 96 108*  BUN 14 16 13 13   CREATININE 0.84 0.87 0.81 0.79  CALCIUM 9.9 10.2 10.1 10.0  MG 2.3 2.3 2.2  --  GFR: Estimated Creatinine Clearance: 157 mL/min (by C-G formula based on SCr of 0.79 mg/dL). Liver Function Tests: Recent Labs  Lab 12/01/18 0829 12/02/18 0312 12/04/18 0855 12/05/18 0536  AST 28 33 22 28  ALT 43 55* 43 40  ALKPHOS 43 41 46 46  BILITOT 0.3 0.4 0.6 0.6  PROT 8.0 7.9 8.2* 8.3*  ALBUMIN 4.3 4.5 4.5 4.6   No results for input(s): LIPASE, AMYLASE in the last 168 hours. No results for input(s): AMMONIA in the last 168 hours. Coagulation Profile: No results for input(s): INR, PROTIME in  the last 168 hours. Cardiac Enzymes: No results for input(s): CKTOTAL, CKMB, CKMBINDEX, TROPONINI in the last 168 hours. BNP (last 3 results) No results for input(s): PROBNP in the last 8760 hours. HbA1C: No results for input(s): HGBA1C in the last 72 hours. CBG: No results for input(s): GLUCAP in the last 168 hours. Lipid Profile: No results for input(s): CHOL, HDL, LDLCALC, TRIG, CHOLHDL, LDLDIRECT in the last 72 hours. Thyroid Function Tests: No results for input(s): TSH, T4TOTAL, FREET4, T3FREE, THYROIDAB in the last 72 hours. Anemia Panel: No results for input(s): VITAMINB12, FOLATE, FERRITIN, TIBC, IRON, RETICCTPCT in the last 72 hours. Sepsis Labs: No results for input(s): PROCALCITON, LATICACIDVEN in the last 168 hours.  Recent Results (from the past 240 hour(s))  Culture, blood (routine x 2)     Status: None   Collection Time: 11/26/18  8:09 AM  Result Value Ref Range Status   Specimen Description   Final    BLOOD RIGHT ANTECUBITAL Performed at [facility], [address], [place], [place] [address]    Special Requests   Final    BOTTLES DRAWN AEROBIC AND ANAEROBIC Blood Culture adequate volume Performed at [facility], [address] [place], [place] [address]    Culture   Final    NO GROWTH 5 DAYS Performed at [facility], [address], [place], [place] [address]    Report Status 12/01/2018 FINAL  Final  Culture, blood (routine x 2)     Status: None   Collection Time: 11/26/18  8:11 AM  Result Value Ref Range Status   Specimen Description   Final    BLOOD RIGHT HAND Performed at [facility], [address] [place], [place] [address]    Special Requests   Final    BOTTLES DRAWN AEROBIC ONLY Blood Culture adequate volume Performed at [facility], [address] [place], [place] [address]    Culture   Final    NO GROWTH 5 DAYS Performed at [facility], [address], [place], [place] [address]    Report Status 12/01/2018 FINAL  Final  Culture, Urine     Status: None   Collection Time: 11/27/18  1:11 PM  Result Value Ref Range Status   Specimen Description   Final    URINE, CLEAN CATCH Performed at [facility], [address] [place], [place] [address]    Special Requests   Final    NONE Performed at [facility], [address], [place], [place] [address]    Culture   Final    NO GROWTH Performed at [facility], [address] [place], [place] [address]    Report Status 11/28/2018 FINAL  Final  Culture, blood (routine x 2)     Status: None (Preliminary result)   Collection Time: 12/02/18  8:54 AM  Result  Value Ref Range Status   Specimen Description   Final    BLOOD BLOOD RIGHT HAND Performed at [facility], [address], [place], [place] [address]    Special Requests   Final    BOTTLES DRAWN AEROBIC AND ANAEROBIC Blood Culture adequate volume Performed at [facility], [address] [place], [place] [address]    Culture   Final    NO GROWTH 3 DAYS Performed at [facility], [address], [place], [place] [address]    Report Status PENDING  Incomplete  Culture, blood (routine x 2)     Status: None (Preliminary result)   Collection Time: 12/02/18  8:54 AM  Result Value Ref Range Status   Specimen Description   Final    BLOOD LEFT ANTECUBITAL Performed at [facility], [address] [place], [place] [address]    Special Requests   Final    BOTTLES DRAWN AEROBIC ONLY Blood Culture results may not be optimal due to an inadequate volume of blood received in culture bottles Performed at [facility], [address] [place], [place] [address]    Culture   Final    NO GROWTH 3 DAYS Performed at [facility], [address], [place], [place] [address]    Report Status PENDING  Incomplete       Radiology Studies: No results found.  Scheduled Meds: . amLODipine  10 mg Oral Daily  . carvedilol  25 mg Oral BID WC  . docusate sodium  100 mg Oral BID  . famotidine  20 mg Oral BID  . feeding supplement (ENSURE ENLIVE)  237 mL Oral TID BM  . feeding supplement (PRO-STAT SUGAR FREE 64)  30 mL Oral BID  . folic acid  1 mg Oral Daily  . heparin  5,000 Units Subcutaneous Q8H  . hydrALAZINE  50 mg Oral TID  . lisinopril  10 mg Oral Daily  . magnesium oxide  400 mg Oral BID  . mouth rinse  15 mL Mouth Rinse BID  . multivitamin with minerals  1 tablet Oral Daily  . sodium chloride flush  3 mL Intravenous Q12H  . thiamine  100 mg Oral Daily   Continuous Infusions:   LOS: 15 days   Assessment & Plan:   Principal Problem:   Alcohol use disorder, severe, dependence (HCC) Active Problems:   Alcoholic hepatitis without ascites   Alcohol withdrawal (HCC)   Alcohol abuse   Gastroesophageal reflux disease with esophagitis   Ataxia   HTN (hypertension)  Alcohol intoxication with alcohol withdrawal/delirium tremens: Patient has been off of Precedex drip since 11/28/2018.  He was very tremulous again today, this seems to be now his baseline due to his Wernicke's encephalopathy.  DC CIWA as well as PRN Ativan but continue multivitamin, folic acid and thiamine.  Fever: Has remained afebrile since the evening of 12/03/2018.  All the cultures, UA and chest x-ray have been negative. No signs of infection.  My NeutrapHor in the 24 hours.  Hypertension/sinus tachycardia: Blood pressure still elevated but slightly better than yesterday.  Will increase continue carvedilol to 25 mg twice daily and amlodipine to 10 mg.  Increase hydralazine to 50 3 times daily and add lisinopril 10 mg p.o. daily.  Continue PRN hydralazine and labetalol.  GERD: Continue Pepcid twice daily.  DVT prophylaxis: Heparin Code Status: Full code Family Communication: No family present at the bedside Disposition  Plan: Observe for  24 hours.  Patient does not want to go to rehab.  Case manager discussed with his girlfriend and his brother and they are willing to take him home with supervision.  Time spent: 29 minutes  Hughie Clossavi Antionio Negron, MD [facility] Pager [phone]  If 7PM-7AM, please contact night-coverage [email or web address] Password [email or web address] 12/05/2018, 10:24 AM

## 2018-12-06 LAB — CBC WITH DIFFERENTIAL/PLATELET
Abs Immature Granulocytes: 0.06 10*3/uL (ref 0.00–0.07)
Basophils Absolute: 0 10*3/uL (ref 0.0–0.1)
Basophils Relative: 0 %
Eosinophils Absolute: 0 10*3/uL (ref 0.0–0.5)
Eosinophils Relative: 0 %
HCT: 41 % (ref 39.0–52.0)
Hemoglobin: 12.9 g/dL — ABNORMAL LOW (ref 13.0–17.0)
Immature Granulocytes: 1 %
Lymphocytes Relative: 14 %
Lymphs Abs: 1.6 10*3/uL (ref 0.7–4.0)
MCH: 32.7 pg (ref 26.0–34.0)
MCHC: 31.5 g/dL (ref 30.0–36.0)
MCV: 103.8 fL — ABNORMAL HIGH (ref 80.0–100.0)
Monocytes Absolute: 1.5 10*3/uL — ABNORMAL HIGH (ref 0.1–1.0)
Monocytes Relative: 13 %
Neutro Abs: 8.1 10*3/uL — ABNORMAL HIGH (ref 1.7–7.7)
Neutrophils Relative %: 72 %
Platelets: 470 10*3/uL — ABNORMAL HIGH (ref 150–400)
RBC: 3.95 MIL/uL — ABNORMAL LOW (ref 4.22–5.81)
RDW: 12.3 % (ref 11.5–15.5)
WBC: 11.4 10*3/uL — ABNORMAL HIGH (ref 4.0–10.5)
nRBC: 0 % (ref 0.0–0.2)

## 2018-12-06 LAB — COMPREHENSIVE METABOLIC PANEL
ALT: 35 U/L (ref 0–44)
AST: 21 U/L (ref 15–41)
Albumin: 4.5 g/dL (ref 3.5–5.0)
Alkaline Phosphatase: 44 U/L (ref 38–126)
Anion gap: 12 (ref 5–15)
BUN: 29 mg/dL — ABNORMAL HIGH (ref 6–20)
CO2: 25 mmol/L (ref 22–32)
Calcium: 10.1 mg/dL (ref 8.9–10.3)
Chloride: 99 mmol/L (ref 98–111)
Creatinine, Ser: 1.55 mg/dL — ABNORMAL HIGH (ref 0.61–1.24)
GFR calc Af Amer: >60 mL/min (ref >60)
GFR calc non Af Amer: 59 mL/min — ABNORMAL LOW (ref >60)
Glucose, Bld: 104 mg/dL — ABNORMAL HIGH (ref 70–99)
Potassium: 3.8 mmol/L (ref 3.5–5.1)
Sodium: 136 mmol/L (ref 135–145)
Total Bilirubin: 0.5 mg/dL (ref 0.3–1.2)
Total Protein: 8.1 g/dL (ref 6.5–8.1)

## 2018-12-06 MED ORDER — CARVEDILOL 12.5 MG PO TABS
12.5000 mg | ORAL_TABLET | Freq: Two times a day (BID) | ORAL | Status: DC
  Administered 2018-12-06 – 2018-12-07 (×2): 12.5 mg via ORAL
  Filled 2018-12-06 (×2): qty 1

## 2018-12-06 MED ORDER — SODIUM CHLORIDE 0.9 % IV SOLN
INTRAVENOUS | Status: DC
  Administered 2018-12-06 – 2018-12-07 (×3): via INTRAVENOUS

## 2018-12-06 NOTE — Progress Notes (Addendum)
PROGRESS NOTE    Brett Molina  WLN:[id number] DOB: April 03, 1991 DOA: 11/19/2018 PCP: Patient, No Pcp Per   Brief Narrative:  Brett Molina is a 28 year old male with alcoholism and currently with heavy binge drinking, history of hypertension who presented to the emergency room with intoxication and unable to ambulate without assistance. In the ER he was intoxicated with alcohol level of 411. He was also found to be severely dehydrated. Treated in the emergency room and found to be ataxic to ambulate and with alcohol withdrawal so admitted to the hospital.  He was transferred to stepdown unit overnight on 5/20-5/21 due to increasing CIWA scale and worsening disorientation, hallucinations. Overnight 5/24-5/25, he continued to worsen, was moved to ICU for precedex drip which was eventually stopped on 11/28/2018.  Patient had significant uncontrolled hypertension and tachycardia and slightly high CIWA requiring intermittent Ativan so he was kept in ICU for several days after that and was transferred to medical floor on 12/04/2018.  His antihypertensives have been tailored and his blood pressure is much better now.  Patient was evaluated by PT OT and they recommended a skilled nursing facility versus 24-hour supervision and patient declined to go to skilled nursing facility instead wants to go to home with home health which has been arranged for him and patient was supposed to be discharged today however his creatinine has jumped due to the fact that he does not tend to drink enough water.  Keeping him overnight for IV fluids and repeating labs in the morning.  Potential discharge tomorrow.  On review of his admission in June 2018, his prolonged hospital course was as follows: 6/9 admit 6/11onprecedex 6/15 off precedexand out of ICU 6/17back on precedex 6/19off precedex and out of ICU 6/20 intermittent hallucinations and delirium 6/24 discharge to inpatient psych facility   Consultants:   Psychiatry  Neurology  Procedures:   None  Antimicrobials:   None   Subjective: Patient seen and examined.  He has no complaints.  He feels much better and he looks much better clinically as well.  He still has tremors but significant improvement.  Objective: Vitals:   12/05/18 2216 12/05/18 2341 12/06/18 0329 12/06/18 0331  BP: (!) 108/44 111/61  121/74  Pulse:  (!) 105  (!) 102  Resp:    20  Temp:    98.7 F (37.1 C)  TempSrc:      SpO2:    100%  Weight:   91.1 kg   Height:        Intake/Output Summary (Last 24 hours) at 12/06/2018 1014 Last data filed at 12/06/2018 0827 Gross per 24 hour  Intake 480 ml  Output 800 ml  Net -320 ml   Filed Weights   12/04/18 0538 12/05/18 0500 12/06/18 0329  Weight: 89.7 kg 91.6 kg 91.1 kg    Examination:  General exam: Appears calm and comfortable, gross tremors upper and lower extremities Respiratory system: Clear to auscultation. Respiratory effort normal. Cardiovascular system: S1 & S2 heard, RRR. No JVD, murmurs, rubs, gallops or clicks. No pedal edema. Gastrointestinal system: Abdomen is nondistended, soft and nontender. No organomegaly or masses felt. Normal bowel sounds heard. Central nervous system: Alert and oriented. No focal neurological deficits. Extremities: Symmetric 5 x 5 power. Skin: No rashes, lesions or ulcers Psychiatry: Judgement and insight appear poor, mood & affect appropriate.  Data Reviewed: I have personally reviewed following labs and imaging studies  CBC: Recent Labs  Lab 12/02/18 0312 12/03/18 0309 12/04/18 0409 12/05/18 7408  12/06/18 0556  WBC 12.3* 11.4* 11.1* 14.2* 11.4*  NEUTROABS 9.1* 8.4* 8.5* 10.7* 8.1*  HGB 13.1 12.8* 13.8 13.3 12.9*  HCT 40.6 38.9* 41.6 40.9 41.0  MCV 105.2* 103.7* 100.7* 103.3* 103.8*  PLT 442* 437* 437* 463* 470*   Basic Metabolic Panel: Recent Labs  Lab 12/01/18 0829 12/02/18 0312 12/04/18 0855 12/05/18 0536 12/06/18 0556  NA 137 138  136 134* 136  K 3.9 4.3 3.9 4.1 3.8  CL 103 102 101 100 99  CO2 GLUCOSE 145* 108* 96 108* 104*  BUN 29*  CREATININE 0.84 0.87 0.81 0.79 1.55*  CALCIUM 9.9 10.2 10.1 10.0 10.1  MG 2.3 2.3 2.2  --   --    GFR: Estimated Creatinine Clearance: 81 mL/min (A) (by C-G formula based on SCr of 1.55 mg/dL (H)). Liver Function Tests: Recent Labs  Lab 12/01/18 0829 12/02/18 0312 12/04/18 0855 12/05/18 0536 12/06/18 0556  AST 28 33 ALT 43 55* 43 40 35  ALKPHOS 43 41 46 46 44  BILITOT 0.3 0.4 0.6 0.6 0.5  PROT 8.0 7.9 8.2* 8.3* 8.1  ALBUMIN 4.3 4.5 4.5 4.6 4.5   No results for input(s): LIPASE, AMYLASE in the last 168 hours. No results for input(s): AMMONIA in the last 168 hours. Coagulation Profile: No results for input(s): INR, PROTIME in the last 168 hours. Cardiac Enzymes: No results for input(s): CKTOTAL, CKMB, CKMBINDEX, TROPONINI in the last 168 hours. BNP (last 3 results) No results for input(s): PROBNP in the last 8760 hours. HbA1C: No results for input(s): HGBA1C in the last 72 hours. CBG: No results for input(s): GLUCAP in the last 168 hours. Lipid Profile: No results for input(s): CHOL, HDL, LDLCALC, TRIG, CHOLHDL, LDLDIRECT in the last 72 hours. Thyroid Function Tests: No results for input(s): TSH, T4TOTAL, FREET4, T3FREE, THYROIDAB in the last 72 hours. Anemia Panel: No results for input(s): VITAMINB12, FOLATE, FERRITIN, TIBC, IRON, RETICCTPCT in the last 72 hours. Sepsis Labs: No results for input(s): PROCALCITON, LATICACIDVEN in the last 168 hours.  Recent Results (from the past 240 hour(s))  Culture, Urine     Status: None   Collection Time: 11/27/18  1:11 PM  Result Value Ref Range Status   Specimen Description   Final    URINE, CLEAN CATCH Performed at [facility], [address] [place], [place] [address]    Special Requests   Final    NONE Performed at [facility], [address], [place], [place] [address]    Culture   Final    NO GROWTH Performed at [facility], [address] [place], [place] [address]    Report Status 11/28/2018 FINAL  Final  Culture, blood (routine x 2)     Status: None (Preliminary result)   Collection Time: 12/02/18  8:54 AM  Result Value Ref Range Status   Specimen Description   Final    BLOOD BLOOD RIGHT HAND Performed at [facility], [address], [place], [place] [address]    Special Requests   Final    BOTTLES DRAWN AEROBIC AND ANAEROBIC Blood Culture adequate volume Performed at [facility], [address] [place], [place] [address]    Culture   Final    NO GROWTH 4 DAYS Performed at [facility], [address], [place], [place] [address]    Report Status PENDING  Incomplete  Culture, blood (routine x 2)     Status: None (Preliminary result)   Collection Time: 12/02/18  8:54 AM  Result Value Ref Range Status   Specimen Description   Final    BLOOD LEFT ANTECUBITAL Performed at [facility], [address] [place], [place] [address]    Special Requests   Final    BOTTLES DRAWN AEROBIC ONLY Blood Culture results may not be optimal due to an inadequate volume of blood received in culture bottles Performed at [facility], [address] [place], [place] [address]    Culture   Final    NO GROWTH 4 DAYS Performed at [facility], [address] [place], [place] [address]    Report Status PENDING  Incomplete      Radiology Studies: No results found.  Scheduled Meds: . amLODipine  10 mg Oral Daily  . carvedilol  25 mg Oral BID WC  . docusate sodium  100 mg Oral BID  . famotidine  20 mg Oral BID  . feeding supplement (ENSURE ENLIVE)  237 mL Oral TID BM  . feeding supplement (PRO-STAT SUGAR FREE 64)  30 mL Oral BID  . folic acid  1 mg Oral Daily  . heparin  5,000 Units Subcutaneous Q8H  . hydrALAZINE  50 mg  Oral TID  . lisinopril  10 mg Oral Daily  . magnesium oxide  400 mg Oral BID  . mouth rinse  15 mL Mouth Rinse BID  . multivitamin with minerals  1 tablet Oral Daily  . sodium chloride flush  3 mL Intravenous Q12H  . thiamine  100 mg Oral Daily   Continuous Infusions: . sodium chloride       LOS: 16 days   Assessment & Plan:   Principal Problem:   Alcohol use disorder, severe, dependence (HCC) Active Problems:   Alcoholic hepatitis without ascites   Alcohol withdrawal (HCC)   Alcohol abuse   Gastroesophageal reflux disease with esophagitis   Ataxia   HTN (hypertension)  Alcohol intoxication with alcohol withdrawal/delirium tremens: Patient has been off of Precedex drip since 11/28/2018.  Continues to have upper and lower extremity gross tremors secondary to Wernicke's encephalopathy but this is improving.  Continue multivitamin, folic acid and thiamine.  Fever: Has remained afebrile since the evening of 12/03/2018.  All the cultures, UA and chest x-ray have been negative. No signs of infection.  He has remained afebrile for 2 to 3 days now.  Hypertension/sinus tachycardia: Blood pressure and heart rate has improved significantly.  Continue carvedilol 25 mg twice daily and amlodipine 10 mg, hydralazine 50 3 times daily and lisinopril 10 mg p.o. daily.  Continue PRN hydralazine and labetalol.  GERD: Continue Pepcid twice daily.  DVT prophylaxis: Heparin Code Status: Full code Family Communication: No family present at the bedside Disposition Plan: Observe overnight for IV fluids.  Potentially discharge back to home with home or tomorrow.  Time spent: 25 minutes  Brett Clossavi Jaylun Fleener, MD [facility] Pager [phone]  If 7PM-7AM, please contact night-coverage [email or web address] Password [email or web address] 12/06/2018, 10:14 AM    Addendum: Patient's orthostatic blood pressure dropped to 60 systolic.  He was slightly symptomatic with dizziness which improved with laying flat.  Blood pressure is  now improved.  With this new development, I will cut back his carvedilol to 12.5 twice daily.  Continue rest of the management and watch closely.  If needed, amlodipine can be reduced to 5 mg as well.  I will  put holding parameters to all his antihypertensives.

## 2018-12-06 NOTE — Progress Notes (Signed)
Pt BP was 88/40 manually at 2117. On call NP was paged but no orders were received. Follow up BP with no interventions was 108/44 an hour later. Pt BP slowly climbed all night and was 121/74 at 0331.

## 2018-12-06 NOTE — Progress Notes (Signed)
Occupational Therapy Treatment Patient Details Name: Brett Molina MRN: [id number] DOB: 1991/04/25 Today's Date: 12/06/2018    History of present illness Brett Molina is a 28 y.o. male with PHMx: th medical history significant of   hypertension, GERD, chronic alcohol abuse, history of upper GI, and depression presented to the ED on 5/16 in  alcohol intoxication state.   OT comments  Continues to have decreased postural control in standing, which limits adls.  UE tremor improved.  Goals updated   Follow Up Recommendations  Supervision/Assistance - 24 hour(pt does not qualify for HHOT)    Equipment Recommendations  3 in 1 bedside commode;Tub/shower bench    Recommendations for Other Services      Precautions / Restrictions Precautions Precautions: Fall Precaution Comments: monitor HR       Mobility Bed Mobility         Supine to sit: Supervision        Transfers   Equipment used: Rolling walker (2 wheeled)   Sit to Stand: Mod assist Stand pivot transfers: Mod assist       General transfer comment: mod A for trunk stabilization     Balance             Standing balance-Leahy Scale: Poor                             ADL either performed or assessed with clinical judgement   ADL   Eating/Feeding: Set up   Grooming: Oral care;Set up               Lower Body Dressing: Sit to/from stand;Moderate assistance(for sit to stand) Lower Body Dressing Details (indicate cue type and reason): socks min A Toilet Transfer: Moderate assistance;Squat-pivot;RW(chair)             General ADL Comments: pt with decreased postural control in standing     Vision       Perception     Praxis      Cognition Arousal/Alertness: Awake/alert Behavior During Therapy: Impulsive;WFL for tasks assessed/performed                                   General Comments: decreased safety: moves quickly and decreased motor control        Exercises     Shoulder Instructions       General Comments      Pertinent Vitals/ Pain       Pain Assessment: No/denies pain  Home Living                                          Prior Functioning/Environment              Frequency  Min 2X/week        Progress Toward Goals  OT Goals(current goals can now be found in the care plan section)  Progress towards OT goals: Progressing toward goals(goals updated)  Acute Rehab OT Goals Time For Goal Achievement: 12/20/18 Potential to Achieve Goals: Fair ADL Goals Pt Will Perform Grooming: with min guard assist;standing Pt Will Perform Upper Body Bathing: with set-up;sitting Pt Will Perform Lower Body Bathing: with min guard assist;sit to/from stand Pt Will Perform Upper Body Dressing: with set-up;sitting Pt Will Perform Lower Body Dressing:  with min guard assist;sit to/from stand Pt Will Transfer to Toilet: with min assist;bedside commode;stand pivot transfer Pt Will Perform Toileting - Clothing Manipulation and hygiene: with min guard assist;sit to/from stand Pt Will Perform Tub/Shower Transfer: (d/c) Pt/caregiver will Perform Home Exercise Program: (discontinue)  Plan      Co-evaluation                 AM-PAC OT "6 Clicks" Daily Activity     Outcome Measure   Help from another person eating meals?: None Help from another person taking care of personal grooming?: A Little Help from another person toileting, which includes using toliet, bedpan, or urinal?: A Lot Help from another person bathing (including washing, rinsing, drying)?: A Lot Help from another person to put on and taking off regular upper body clothing?: A Little Help from another person to put on and taking off regular lower body clothing?: A Lot 6 Click Score: 16    End of Session    OT Visit Diagnosis: Unsteadiness on feet (R26.81);Other abnormalities of gait and mobility (R26.89);Muscle weakness (generalized)  (M62.81);Ataxia, unspecified (R27.0)   Activity Tolerance     Patient Left     Nurse Communication          Time: 3212-2482 OT Time Calculation (min): 28 min  Charges: OT General Charges $OT Visit: 1 Visit OT Treatments $Self Care/Home Management : 23-37 mins  Marica Otter, OTR/L Acute Rehabilitation Services [phone] [facility] pager [phone] office 12/06/2018   [name] 12/06/2018, 11:24 AM

## 2018-12-07 DIAGNOSIS — F10920 Alcohol use, unspecified with intoxication, uncomplicated: Secondary | ICD-10-CM

## 2018-12-07 DIAGNOSIS — K210 Gastro-esophageal reflux disease with esophagitis: Secondary | ICD-10-CM

## 2018-12-07 DIAGNOSIS — F1010 Alcohol abuse, uncomplicated: Secondary | ICD-10-CM

## 2018-12-07 DIAGNOSIS — I10 Essential (primary) hypertension: Secondary | ICD-10-CM

## 2018-12-07 LAB — CBC WITH DIFFERENTIAL/PLATELET
Abs Immature Granulocytes: 0.04 10*3/uL (ref 0.00–0.07)
Basophils Absolute: 0 10*3/uL (ref 0.0–0.1)
Basophils Relative: 0 %
Eosinophils Absolute: 0 10*3/uL (ref 0.0–0.5)
Eosinophils Relative: 0 %
HCT: 37.8 % — ABNORMAL LOW (ref 39.0–52.0)
Hemoglobin: 12.3 g/dL — ABNORMAL LOW (ref 13.0–17.0)
Immature Granulocytes: 0 %
Lymphocytes Relative: 17 %
Lymphs Abs: 1.7 10*3/uL (ref 0.7–4.0)
MCH: 33.8 pg (ref 26.0–34.0)
MCHC: 32.5 g/dL (ref 30.0–36.0)
MCV: 103.8 fL — ABNORMAL HIGH (ref 80.0–100.0)
Monocytes Absolute: 1.1 10*3/uL — ABNORMAL HIGH (ref 0.1–1.0)
Monocytes Relative: 11 %
Neutro Abs: 7 10*3/uL (ref 1.7–7.7)
Neutrophils Relative %: 72 %
Platelets: 429 10*3/uL — ABNORMAL HIGH (ref 150–400)
RBC: 3.64 MIL/uL — ABNORMAL LOW (ref 4.22–5.81)
RDW: 12 % (ref 11.5–15.5)
WBC: 9.9 10*3/uL (ref 4.0–10.5)
nRBC: 0 % (ref 0.0–0.2)

## 2018-12-07 LAB — BASIC METABOLIC PANEL
Anion gap: 7 (ref 5–15)
BUN: 15 mg/dL (ref 6–20)
CO2: 23 mmol/L (ref 22–32)
Calcium: 9.9 mg/dL (ref 8.9–10.3)
Chloride: 107 mmol/L (ref 98–111)
Creatinine, Ser: 0.8 mg/dL (ref 0.61–1.24)
GFR calc Af Amer: >60 mL/min (ref >60)
GFR calc non Af Amer: >60 mL/min (ref >60)
Glucose, Bld: 101 mg/dL — ABNORMAL HIGH (ref 70–99)
Potassium: 4 mmol/L (ref 3.5–5.1)
Sodium: 137 mmol/L (ref 135–145)

## 2018-12-07 LAB — CULTURE, BLOOD (ROUTINE X 2)
Culture: NO GROWTH
Culture: NO GROWTH
Special Requests: ADEQUATE

## 2018-12-07 MED ORDER — CARVEDILOL 12.5 MG PO TABS: 12.5000 mg | ORAL_TABLET | Freq: Two times a day (BID) | ORAL | 0 refills | Status: DC

## 2018-12-07 MED ORDER — LISINOPRIL 10 MG PO TABS: 10.0000 mg | ORAL_TABLET | Freq: Every day | ORAL | 0 refills | Status: DC

## 2018-12-07 MED ORDER — AMLODIPINE BESYLATE 10 MG PO TABS: 10.0000 mg | ORAL_TABLET | Freq: Every day | ORAL | 0 refills | Status: DC

## 2018-12-07 NOTE — Progress Notes (Signed)
Occupational Therapy Treatment Patient Details Name: Brett Molina MRN: [id number] DOB: [birth year] Today's Date: 12/07/2018    History of present illness Brett Molina is a 28 y.o. male with PHMx: th medical history significant of   hypertension, GERD, chronic alcohol abuse, history of upper GI, and depression presented to the ED on 5/16 in  alcohol intoxication state.   OT comments  Worked on sit to stand for adls/safety.  Pt was orthostatic but asymptomatic. See vitals section of chart  Follow Up Recommendations  Supervision/Assistance - 24 hour(needs +2 assist for transfers)    Equipment Recommendations  None recommended by OT(pt reports he has a 3:1)    Recommendations for Other Services      Precautions / Restrictions Precautions Precautions: Fall Precaution Comments: monitor HR Restrictions Weight Bearing Restrictions: No       Mobility Bed Mobility         Supine to sit: Supervision Sit to supine: Supervision      Transfers   Equipment used: Rolling walker (2 wheeled)   Sit to Stand: Mod assist Stand pivot transfers: Mod assist;+2 safety/equipment       General transfer comment: assist for trunk stabilization    Balance             Standing balance-Leahy Scale: Poor Standing balance comment: reliant on UE support and OT for safety and steadying                           ADL either performed or assessed with clinical judgement   ADL                                         General ADL Comments: worked on sit to stand x 3 for adls.  Once from high bed, twice from chair. Pt slightly more controlled when positioning one hand on chair armrest and one on RW vs both on chair armrests     Vision       Perception     Praxis      Cognition Arousal/Alertness: Awake/alert Behavior During Therapy: [facility] for tasks assessed/performed   Area of Impairment: Safety/judgement;Following  commands;Orientation;Attention                               General Comments: pt continues to move quickly. He has decreased postural control.          Exercises     Shoulder Instructions       General Comments pt was orthostatic but asymptomatic.  See vitals section of chart    Pertinent Vitals/ Pain       Pain Assessment: No/denies pain  Home Living                                          Prior Functioning/Environment              Frequency  Min 2X/week        Progress Toward Goals  OT Goals(current goals can now be found in the care plan section)  Progress towards OT goals: Progressing toward goals     Plan      Co-evaluation  AM-PAC OT "6 Clicks" Daily Activity     Outcome Measure   Help from another person eating meals?: None Help from another person taking care of personal grooming?: A Little Help from another person toileting, which includes using toliet, bedpan, or urinal?: A Lot Help from another person bathing (including washing, rinsing, drying)?: A Lot Help from another person to put on and taking off regular upper body clothing?: A Little Help from another person to put on and taking off regular lower body clothing?: A Lot 6 Click Score: 16    End of Session    OT Visit Diagnosis: Unsteadiness on feet (R26.81);Other abnormalities of gait and mobility (R26.89);Muscle weakness (generalized) (M62.81);Ataxia, unspecified (R27.0)   Activity Tolerance Patient tolerated treatment well   Patient Left in chair;with call bell/phone within reach;with chair alarm set   Nurse Communication          Time: [phone] OT Time Calculation (min): 31 min  Charges: OT General Charges $OT Visit: 1 Visit OT Treatments $Therapeutic Activity: 23-37 mins  Marica Otter, OTR/L Acute Rehabilitation Services [phone] [facility] pager [phone] office 12/07/2018   Larry Knipp 12/07/2018, 10:34  AM

## 2018-12-07 NOTE — Progress Notes (Signed)
Pt discharged home today per Dr. Ella Jubilee. Pt's IV site D/C'd and WDL. Pt's VSS. Pt and girlfriend (over the phone) provided with home medication list, discharge instructions and prescriptions. Verbalized understanding. Pt left floor via WC in stable condition accompanied by NT.

## 2018-12-07 NOTE — Discharge Summary (Signed)
Physician Discharge Summary  Amanuel Sinkfield Dillin ZOX:[id number] DOB: 10/26/1990 DOA: 11/19/2018  PCP: Patient, No Pcp Per  Admit date: 11/19/2018 Discharge date: 12/07/2018  Admitted From: home  Disposition:  Home   Recommendations for Outpatient Follow-up and new medication changes:  1. Follow up with Primary Care in 7 days.  2. Continue tid hydralazine.  3. Changed labetalol to carvedilol.  4. Added amlodipine and lisinopril.   Home Health: Yes   Equipment/Devices: no    Discharge Condition: stable  CODE STATUS: full  Diet recommendation: regular.   Brief/Interim Summary: 28 yo male who presented with alcohol intoxication. He does have the significant past medical history of HTN, GERD, chronic alcohol abuse, history of upper GI bleed and depression. He had increased his alcohol consumption over last 2 months. He was noted to have tremors, hypertension and tachycardia in the ED despite benzodiazepines and IV fluids. On his initial physical examination his blood pressure was 163/103 with hear rate 131, respiratory rate 28 and oxygen saturation 95%. Moist mucous membranes, lungs clear to auscultation, heart S1-S2 present and tachycardic, abdomen soft, no lower extremity edema. He was not able to stand without assistance, non focal.  Sodium 143, potassium 3.5, chloride 107, bicarb 21, glucose 99, BUN 5, creatinine 0.67, lipase 29, AST 31, ALT 39, total bilirubin 0.3, white count 13.9, hemoglobin 12.5, hematocrit 37.1, platelets 366.  SARS COVID-19 negative.  Urinalysis negative for infection, specific gravity 1.003.  Alcohol level was 411.  Head CT was negative for acute changes.  His chest radiograph was negative for infiltrates.  Brain and cervical spine MRI with no acute changes.  EKG 134 bpm with a normal axis normal intervals, sinus rhythm with no significant ST segment or T wave changes.  Patient was admitted to the hospital with a working diagnosis of acute alcohol intoxication with  withdrawal syndrome.  1.  Acute alcohol intoxication with alcohol withdrawal syndrome.  Patient was admitted to the progressive care unit, required high doses of lorazepam per CIWA protocol, then he was placed on dexmedetomidine infusion for about 8 days. Patient received multivitamins including thiamine.  Due to persistent disorientation and hallucinations, patient was seen by psychiatry, he was placed on gabapentin and Seroquel.  At discharge his symptoms have improved, he will follow-up with AA meetings, and further outpatient services for his alcohol addiction.  2.  Uncontrolled hypertension.  Patient's antihypertensive agents were modified, he was placed on carvedilol 25 mg twice daily, amlodipine 10 mg daily, and lisinopril 10 mg daily.  He will continue taking hydralazine 3 times daily, labetalol has been discontinued.  3.  Acute kidney injury.  Patient had transitory increase in his creatinine up to 1.55, now at discharge is down to 0.8.  Follow-up kidney function as an outpatient.  4.  Ambulatory dysfunction.  Patient declined skilled nursing facility, he will have home health services.  5.  GERD with history of upper GI bleed.  Patient will continue pantoprazole.  Discharge Diagnoses:  Principal Problem:   Alcohol use disorder, severe, dependence (HCC) Active Problems:   Alcohol withdrawal (HCC)   Alcohol abuse   Gastroesophageal reflux disease with esophagitis   Ataxia   HTN (hypertension)    Discharge Instructions   Allergies as of 12/07/2018   No Known Allergies     Medication List    STOP taking these medications   chlordiazePOXIDE 25 MG capsule Commonly known as:  LIBRIUM   famotidine 20 MG tablet Commonly known as:  PEPCID   folic acid  1 MG tablet Commonly known as:  FOLVITE   labetalol 300 MG tablet Commonly known as:  NORMODYNE   multivitamin with minerals Tabs tablet     TAKE these medications   amLODipine 10 MG tablet Commonly known as:   NORVASC Take 1 tablet (10 mg total) by mouth daily for 30 days.   carvedilol 12.5 MG tablet Commonly known as:  COREG Take 1 tablet (12.5 mg total) by mouth 2 (two) times daily with a meal for 30 days.   hydrALAZINE 25 MG tablet Commonly known as:  APRESOLINE Take 2 tablets (50 mg total) by mouth 3 (three) times daily for 30 days.   lisinopril 10 MG tablet Commonly known as:  ZESTRIL Take 1 tablet (10 mg total) by mouth daily for 30 doses.   pantoprazole 40 MG tablet Commonly known as:  PROTONIX Take 1 tablet (40 mg total) by mouth daily at 6 (six) AM.   thiamine 100 MG tablet Take 1 tablet (100 mg total) by mouth daily.            Durable Medical Equipment  (From admission, onward)         Start     Ordered   12/05/18 1003  For home use only DME Walker rolling  Once    Question:  Patient needs a walker to treat with the following condition  Answer:  Unsteady gait   12/05/18 1002         Follow-up Information    Schedule an appointment as soon as possible for a visit  with Your primary care doctor.Marland Kitchen        [facility]. Call.   Why:  call to make a follow up appointment and establish pcp. On site pharamcy available to fill prescritions Contact information: [address] [place] [address] [phone]       [facility] Follow up.   Why:  rolling walker Contact information: [address] [place] [address] [phone]        [facility], [facility] Follow up.   Specialty:  Home Health Services Why:  [facility] physical therapy Contact information: [address] [place] [address] [phone]          No Known Allergies  Consultations: Neurology and psychiatry.    Procedures/Studies: Ct Head Wo Contrast  Result Date: 11/19/2018 CLINICAL DATA:  Altered level of consciousness. EXAM: CT HEAD WITHOUT CONTRAST TECHNIQUE: Contiguous axial images were obtained from  the base of the skull through the vertex without intravenous contrast. COMPARISON:  01/23/2017 FINDINGS: Brain: No evidence of acute infarction, hemorrhage, hydrocephalus, extra-axial collection or mass lesion/mass effect. Vascular: No hyperdense vessel or unexpected calcification. Skull: Normal. Negative for fracture or focal lesion. Sinuses/Orbits: No acute finding. Other: None IMPRESSION: 1. No acute intracranial abnormalities.  Normal brain. Electronically Signed   By: Signa Kell M.D.   On: 11/19/2018 18:58   Mr Brain Wo Contrast  Result Date: 11/21/2018 CLINICAL DATA:  Patient with alcoholism and binge drinking presents with intoxication and unable to ambulate. Severely dehydrated. Ataxia. EXAM: MRI HEAD WITHOUT CONTRAST MRI CERVICAL SPINE WITHOUT CONTRAST TECHNIQUE: Multiplanar, multiecho pulse sequences of the brain and surrounding structures, and cervical spine, to include the craniocervical junction and cervicothoracic junction, were obtained without intravenous contrast. COMPARISON:  CT head 11/19/2018 FINDINGS: The patient was uncooperative, and multiple images are motion degraded. In addition, the patient would not allow administration of contrast. The cervical spine images,  in particular, suffer from motion degradation. MRI HEAD FINDINGS Brain: Premature for age cerebral atrophy. Premature for age superior vermian cerebellar atrophy. No significant white matter disease. No acute stroke, hemorrhage, mass lesion, hydrocephalus, or extra-axial fluid. No basal ganglia T1 hyperintensity. No characteristic MR findings to suggest Wernicke encephalopathy, such as abnormal signal in the mamillary bodies, periaqueductal region/IIIrd ventricle or around the thalami, although limited assessment in noncontrast exam. Vascular: Normal flow voids. Skull and upper cervical spine: Normal marrow signal. Sinuses/Orbits: Negative. Other: None. MRI CERVICAL SPINE FINDINGS Alignment: Reversal of the normal cervical  lordotic curve could be positional or due to spasm. Vertebrae: No visible fracture, evidence of diskitis, or bone lesion. Cord: Grossly normal signal and morphology. No definite intraspinal mass lesion. Posterior Fossa, vertebral arteries, paraspinal tissues: Grossly unremarkable. Disc levels: No visible disc protrusion or spinal stenosis. IMPRESSION: Motion degraded examination demonstrates no acute or focal intracranial abnormality. Premature for age cerebral atrophy and premature for age superior vermian cerebellar atrophy. No visible cervical spine fracture, traumatic subluxation, spinal stenosis, disc herniation, or intrinsic cord abnormality. Post infusion imaging could not be performed due to lack of patient cooperation. No noncontrast features of Wernicke encephalopathy, but the most sensitive evaluation requires post infusion imaging. Electronically Signed   By: Elsie Stain M.D.   On: 11/21/2018 20:53   Mr Cervical Spine Wo Contrast  Result Date: 11/21/2018 CLINICAL DATA:  Patient with alcoholism and binge drinking presents with intoxication and unable to ambulate. Severely dehydrated. Ataxia. EXAM: MRI HEAD WITHOUT CONTRAST MRI CERVICAL SPINE WITHOUT CONTRAST TECHNIQUE: Multiplanar, multiecho pulse sequences of the brain and surrounding structures, and cervical spine, to include the craniocervical junction and cervicothoracic junction, were obtained without intravenous contrast. COMPARISON:  CT head 11/19/2018 FINDINGS: The patient was uncooperative, and multiple images are motion degraded. In addition, the patient would not allow administration of contrast. The cervical spine images, in particular, suffer from motion degradation. MRI HEAD FINDINGS Brain: Premature for age cerebral atrophy. Premature for age superior vermian cerebellar atrophy. No significant white matter disease. No acute stroke, hemorrhage, mass lesion, hydrocephalus, or extra-axial fluid. No basal ganglia T1 hyperintensity. No  characteristic MR findings to suggest Wernicke encephalopathy, such as abnormal signal in the mamillary bodies, periaqueductal region/IIIrd ventricle or around the thalami, although limited assessment in noncontrast exam. Vascular: Normal flow voids. Skull and upper cervical spine: Normal marrow signal. Sinuses/Orbits: Negative. Other: None. MRI CERVICAL SPINE FINDINGS Alignment: Reversal of the normal cervical lordotic curve could be positional or due to spasm. Vertebrae: No visible fracture, evidence of diskitis, or bone lesion. Cord: Grossly normal signal and morphology. No definite intraspinal mass lesion. Posterior Fossa, vertebral arteries, paraspinal tissues: Grossly unremarkable. Disc levels: No visible disc protrusion or spinal stenosis. IMPRESSION: Motion degraded examination demonstrates no acute or focal intracranial abnormality. Premature for age cerebral atrophy and premature for age superior vermian cerebellar atrophy. No visible cervical spine fracture, traumatic subluxation, spinal stenosis, disc herniation, or intrinsic cord abnormality. Post infusion imaging could not be performed due to lack of patient cooperation. No noncontrast features of Wernicke encephalopathy, but the most sensitive evaluation requires post infusion imaging. Electronically Signed   By: Elsie Stain M.D.   On: 11/21/2018 20:53   Dg Chest Port 1 View  Result Date: 12/02/2018 CLINICAL DATA:  F/u pneumonia EXAM: PORTABLE CHEST - 1 VIEW COMPARISON:  11/26/2018 FINDINGS: Lungs are clear. Heart size and mediastinal contours are within normal limits. Mild elevation of the left diaphragmatic leaflet. No effusion. Visualized bones unremarkable. IMPRESSION:  No acute cardiopulmonary disease. Electronically Signed   By: Corlis Leak M.D.   On: 12/02/2018 08:09   Dg Chest Port 1 View  Result Date: 11/26/2018 CLINICAL DATA:  Fever EXAM: PORTABLE CHEST 1 VIEW COMPARISON:  11/19/2018 FINDINGS: The heart size and mediastinal contours  are within normal limits. Both lungs are clear. The visualized skeletal structures are unremarkable. IMPRESSION: No active disease. Electronically Signed   By: Marlan Palau M.D.   On: 11/26/2018 08:43   Dg Chest Port 1 View  Result Date: 11/19/2018 CLINICAL DATA:  ETOH, weakness EXAM: PORTABLE CHEST 1 VIEW COMPARISON:  12/14/2016 FINDINGS: Cardiomegaly. Both lungs are clear. The visualized skeletal structures are unremarkable. IMPRESSION: Cardiomegaly without acute abnormality of the lungs in AP portable projection. Electronically Signed   By: Lauralyn Primes M.D.   On: 11/19/2018 18:23      Procedures:   Subjective: Patient is feeling better tremors have improved, no nausea or vomiting, no chest pain or dyspnea, improved mobility.   Discharge Exam: Vitals:   12/07/18 0230 12/07/18 0623  BP: 124/80 135/83  Pulse: (!) 102 (!) 106  Resp: 20 20  Temp: 99.2 F (37.3 C) 99.1 F (37.3 C)  SpO2: 100% 98%   Vitals:   12/06/18 1727 12/06/18 2112 12/07/18 0230 12/07/18 0623  BP: 124/71 (!) 105/57 124/80 135/83  Pulse:  (!) 106 (!) 102 (!) 106  Resp:  Temp:  99 F (37.2 C) 99.2 F (37.3 C) 99.1 F (37.3 C)  TempSrc:  Oral Oral Oral  SpO2:  99% 100% 98%  Weight:   90.9 kg   Height:        General: Not in pain or dyspnea  Neurology: Awake and alert, non focal. Mild resting tremors. mabulatory dysfunction.  E ENT: no pallor, no icterus, oral mucosa moist Cardiovascular: No JVD. S1-S2 present, rhythmic, no gallops, rubs, or murmurs. No lower extremity edema. Pulmonary: vesicular breath sounds bilaterally, adequate air movement, no wheezing, rhonchi or rales. Gastrointestinal. Abdomen with no organomegaly, non tender, no rebound or guarding Skin. No rashes Musculoskeletal: no joint deformities   The results of significant diagnostics from this hospitalization (including imaging, microbiology, ancillary and laboratory) are listed below for reference.      Microbiology: Recent Results (from the past 240 hour(s))  Culture, Urine     Status: None   Collection Time: 11/27/18  1:11 PM  Result Value Ref Range Status   Specimen Description   Final    URINE, CLEAN CATCH Performed at [facility], [address] [place], [place] [address]    Special Requests   Final    NONE Performed at [facility], [address], [place], [place] [address]    Culture   Final    NO GROWTH Performed at [facility], [address] [place], [place] [address]    Report Status 11/28/2018 FINAL  Final  Culture, blood (routine x 2)     Status: None   Collection Time: 12/02/18  8:54 AM  Result Value Ref Range Status   Specimen Description   Final    BLOOD BLOOD RIGHT HAND Performed at [facility], [address], [place], [place] [address]    Special Requests   Final    BOTTLES DRAWN AEROBIC AND ANAEROBIC Blood Culture adequate volume Performed at [facility], [address], [place], [place] [address]    Culture   Final    NO  GROWTH 5 DAYS Performed at [facility], [address] [place], [place] [address]    Report Status 12/07/2018 FINAL  Final  Culture, blood (routine x 2)     Status: None   Collection Time: 12/02/18  8:54 AM  Result Value Ref Range Status   Specimen Description   Final    BLOOD LEFT ANTECUBITAL Performed at [facility], [address] [place], [place] [address]    Special Requests   Final    BOTTLES DRAWN AEROBIC ONLY Blood Culture results may not be optimal due to an inadequate volume of blood received in culture bottles Performed at [facility], [address] [place], [place] [address]    Culture   Final    NO GROWTH 5 DAYS Performed at [facility], [address] [place], [place] [address]    Report Status 12/07/2018 FINAL  Final     Labs: BNP (last 3 results) Recent Labs     11/20/18 0621  BNP 14.8   Basic Metabolic Panel: Recent Labs  Lab 12/01/18 0829 12/02/18 0312 12/04/18 0855 12/05/18 0536 12/06/18 0556 12/07/18 0549  NA 137 138 136 134* 136 137  K 3.9 4.3 3.9 4.1 3.8 4.0  CL 103 102 101 100 99 107  CO2 GLUCOSE 145* 108* 96 108* 104* 101*  BUN 29* 15  CREATININE 0.84 0.87 0.81 0.79 1.55* 0.80  CALCIUM 9.9 10.2 10.1 10.0 10.1 9.9  MG 2.3 2.3 2.2  --   --   --    Liver Function Tests: Recent Labs  Lab 12/01/18 0829 12/02/18 0312 12/04/18 0855 12/05/18 0536 12/06/18 0556  AST 28 33 ALT 43 55* 43 40 35  ALKPHOS 43 41 46 46 44  BILITOT 0.3 0.4 0.6 0.6 0.5  PROT 8.0 7.9 8.2* 8.3* 8.1  ALBUMIN 4.3 4.5 4.5 4.6 4.5   No results for input(s): LIPASE, AMYLASE in the last 168 hours. No results for input(s): AMMONIA in the last 168 hours. CBC: Recent Labs  Lab 12/03/18 0309 12/04/18 0409 12/05/18 0536 12/06/18 0556 12/07/18 0549  WBC 11.4* 11.1* 14.2* 11.4* 9.9  NEUTROABS 8.4* 8.5* 10.7* 8.1* 7.0  HGB 12.8* 13.8 13.3 12.9* 12.3*  HCT 38.9* 41.6 40.9 41.0 37.8*  MCV 103.7* 100.7* 103.3* 103.8* 103.8*  PLT 437* 437* 463* 470* 429*   Cardiac Enzymes: No results for input(s): CKTOTAL, CKMB, CKMBINDEX, TROPONINI in the last 168 hours. BNP: Invalid input(s): POCBNP CBG: No results for input(s): GLUCAP in the last 168 hours. D-Dimer No results for input(s): DDIMER in the last 72 hours. Hgb A1c No results for input(s): HGBA1C in the last 72 hours. Lipid Profile No results for input(s): CHOL, HDL, LDLCALC, TRIG, CHOLHDL, LDLDIRECT in the last 72 hours. Thyroid function studies No results for input(s): TSH, T4TOTAL, T3FREE, THYROIDAB in the last 72 hours.  Invalid input(s): FREET3 Anemia work up No results for input(s): VITAMINB12, FOLATE, FERRITIN, TIBC, IRON, RETICCTPCT in the last 72 hours. Urinalysis    Component Value Date/Time   COLORURINE YELLOW 12/02/2018 1008   APPEARANCEUR  HAZY (A) 12/02/2018 1008   LABSPEC 1.030 12/02/2018 1008   PHURINE 6.0 12/02/2018 1008   GLUCOSEU NEGATIVE 12/02/2018 1008   HGBUR NEGATIVE 12/02/2018 1008   BILIRUBINUR NEGATIVE 12/02/2018 1008   KETONESUR 5 (A) 12/02/2018 1008   PROTEINUR NEGATIVE 12/02/2018 1008   NITRITE NEGATIVE 12/02/2018 1008  LEUKOCYTESUR NEGATIVE 12/02/2018 1008   Sepsis Labs Invalid input(s): PROCALCITONIN,  WBC,  LACTICIDVEN Microbiology Recent Results (from the past 240 hour(s))  Culture, Urine     Status: None   Collection Time: 11/27/18  1:11 PM  Result Value Ref Range Status   Specimen Description   Final    URINE, CLEAN CATCH Performed at [facility], [address] [place], [place] [address]    Special Requests   Final    NONE Performed at [facility], [address] [place], [place] [address]    Culture   Final    NO GROWTH Performed at [facility], [address], [place], [place] [address]    Report Status 11/28/2018 FINAL  Final  Culture, blood (routine x 2)     Status: None   Collection Time: 12/02/18  8:54 AM  Result Value Ref Range Status   Specimen Description   Final    BLOOD BLOOD RIGHT HAND Performed at [facility], [address] [place], [place] [address]    Special Requests   Final    BOTTLES DRAWN AEROBIC AND ANAEROBIC Blood Culture adequate volume Performed at [facility], [address] [place], [place] [address]    Culture   Final    NO GROWTH 5 DAYS Performed at [facility], [address], [place], [place] [address]    Report Status 12/07/2018 FINAL  Final  Culture, blood (routine x 2)     Status: None   Collection Time: 12/02/18  8:54 AM  Result Value Ref Range Status   Specimen Description   Final    BLOOD LEFT ANTECUBITAL Performed at [facility], [address], [place], [place] [address]    Special Requests   Final    BOTTLES DRAWN  AEROBIC ONLY Blood Culture results may not be optimal due to an inadequate volume of blood received in culture bottles Performed at [facility], [address], [place], [place] [address]    Culture   Final    NO GROWTH 5 DAYS Performed at [facility], [address] [place], [place] [address]    Report Status 12/07/2018 FINAL  Final     Time coordinating discharge: 45 minutes  SIGNED:   Coralie Keens, MD  [facility] 12/07/2018, 8:21 AM

## 2018-12-07 NOTE — Progress Notes (Signed)
Nutrition Follow-up  RD working remotely.   DOCUMENTATION CODES:   Not applicable  INTERVENTION:  - continue ensure enlive BID and protstat BID. - continue to encourage PO intakes. - will monitor for additional nutrition-related needs.    NUTRITION DIAGNOSIS:   Inadequate oral intake related to lethargy/confusion, acute illness as evidenced by meal completion < 50%. -improving  GOAL:   Patient will meet greater than or equal to 90% of their needs -likely unmet on average   MONITOR:   PO intake, Supplement acceptance, Labs, Weight trends  ASSESSMENT:   28 y.o. male with medical history significant of HTN, GERD, chronic alcohol abuse, history of upper GIB, and depression. He presented to the ED on 5/16 in alcohol intoxication state. He was kept in the ED and treated with CIWA protocol, IV fluid hydration. Patient reported that he had not been able to ambulate in the past 2-3 weeks, has borrowed a walker to assist him in ambulation. Admits that his drinking habit history started at age 89, and that in the past 2 months  he has been drinking heavily, typically drinking 1.5L of wine/day and did so about an hour before presentation to the ED on 5/16. Patient reports heavy drinking d/t being laid off from his job due to the pandemic.  Weight is the lowest it's been this hospitalization (admission date: 5/17). Per RN flow sheet, patient is now a/o x4; previously a/o to self only or disoriented x4. Per flow sheet, he consumed 75% of breakfast and 50% of dinner on 5/29; 0% of lunch and dinner on 5/31. He has been accepting all bottles of ensure enlive and all packets of prostat. Daily, these supplements are providing him 900 kcal and 70 grams of protein. Will maintain current orders rather than increasing in order to encourage intakes of meals.   Per notes: ongoing upper and lower extremity tremors 2/2 Wernicke's encephalopathy--improving, afebrile since evening of 5/30, significantly improved  BP and HR, possible d/c home soon.    Medications reviewed; 100 mg colace BID, 20 mg oral pepcid BID, 1 mg folvite/day, 400 mg mag-ox BID, daily multivitamin with minerals, 100 mg thiamine/day.  Labs reviewed. IVF; NS @ 125 ml/hr.      NUTRITION - FOCUSED PHYSICAL EXAM:  unable to complete at this time.   Diet Order:   Diet Order            Diet regular Room service appropriate? Yes; Fluid consistency: Thin  Diet effective now              EDUCATION NEEDS:   Not appropriate for education at this time  Skin:  Skin Assessment: Reviewed RN Assessment  Last BM:  5/31  Height:   Ht Readings from Last 1 Encounters:  11/24/18 6\' 2"  (1.88 m)    Weight:   Wt Readings from Last 1 Encounters:  12/07/18 90.9 kg    Ideal Body Weight:  86.4 kg  BMI:  Body mass index is 25.73 kg/m.  Estimated Nutritional Needs:   Kcal:  5003-7048 kcal  Protein:  120-130 grams  Fluid:  >/= 2.2 L/day      Trenton Gammon, MS, RD, LDN, [facility] Inpatient Clinical Dietitian Pager # [phone] After hours/weekend pager # [phone]

## 2018-12-14 ENCOUNTER — Other Ambulatory Visit: Payer: Self-pay

## 2018-12-14 ENCOUNTER — Ambulatory Visit: Payer: Self-pay | Attending: Family Medicine | Admitting: Family Medicine

## 2018-12-27 ENCOUNTER — Encounter: Payer: Self-pay | Admitting: Family Medicine

## 2018-12-27 ENCOUNTER — Other Ambulatory Visit: Payer: Self-pay

## 2018-12-27 ENCOUNTER — Ambulatory Visit: Payer: Self-pay | Attending: Family Medicine | Admitting: Family Medicine

## 2018-12-27 DIAGNOSIS — R270 Ataxia, unspecified: Secondary | ICD-10-CM

## 2018-12-27 DIAGNOSIS — F1020 Alcohol dependence, uncomplicated: Secondary | ICD-10-CM

## 2018-12-27 DIAGNOSIS — I10 Essential (primary) hypertension: Secondary | ICD-10-CM

## 2018-12-27 MED ORDER — CARVEDILOL 12.5 MG PO TABS: 12.5000 mg | ORAL_TABLET | Freq: Two times a day (BID) | ORAL | 3 refills | Status: DC

## 2018-12-27 MED ORDER — AMLODIPINE BESYLATE 10 MG PO TABS: 10.0000 mg | ORAL_TABLET | Freq: Every day | ORAL | 3 refills | Status: DC

## 2018-12-27 MED ORDER — LISINOPRIL 10 MG PO TABS: 10.0000 mg | ORAL_TABLET | Freq: Every day | ORAL | 3 refills | Status: DC

## 2018-12-27 MED ORDER — NALTREXONE HCL 50 MG PO TABS: 50.0000 mg | ORAL_TABLET | Freq: Every day | ORAL | 3 refills | Status: DC

## 2018-12-27 MED ORDER — HYDRALAZINE HCL 50 MG PO TABS: 50.0000 mg | ORAL_TABLET | Freq: Three times a day (TID) | ORAL | 3 refills | Status: DC

## 2018-12-27 MED ORDER — PANTOPRAZOLE SODIUM 40 MG PO TBEC: 40.0000 mg | DELAYED_RELEASE_TABLET | Freq: Every day | ORAL | 3 refills | Status: DC

## 2018-12-27 MED ORDER — THIAMINE HCL 100 MG PO TABS: 100.0000 mg | ORAL_TABLET | Freq: Every day | ORAL | 2 refills | Status: DC

## 2018-12-27 NOTE — Progress Notes (Signed)
Patient has been called and DOB has been verified. Patient has been screened and transferred to PCP to start phone visit.     

## 2018-12-27 NOTE — Progress Notes (Signed)
Virtual Visit via Telephone Note  I connected with Brett Molina, on 12/27/2018 at 9:52 AM by telephone due to the COVID-19 pandemic and verified that I am speaking with the correct person using two identifiers.   Consent: I discussed the limitations, risks, security and privacy concerns of performing an evaluation and management service by telephone and the availability of in person appointments. I also discussed with the patient that there may be a patient responsible charge related to this service. The patient expressed understanding and agreed to proceed.   Location of Patient: Home  Location of Provider: Clinic   Persons participating in Telemedicine visit: Lavarr Durnin Marquard Melba Coon Dr. Nelwyn Salisbury     History of Present Illness: Brett Molina is a 28 year old male history of hypertension, alcohol abuse who is seen today for a telehealth visit to establish care after hospitalization at [facility] from 11/19/2018 through 12/07/2018 for alcohol intoxication with withdrawal.  He was admitted to the progressive care unit and maintained on the CIWA protocol but he required high doses of lorazepam.  He was closely followed by psychiatry during his stay. Other issues addressed during hospitalization include uncontrolled hypertension, acute kidney injury which improved with a creatinine of 1.55 trending down to 0.8.  He also had ataxia but has declined skilled nursing facility placement.  Today he informs me he quit alcohol for 2 weeks after discharge but then returned to drinking again but does not drink as much.  He drinks about 25 to 40 ounces of wine once or twice a week.  His ataxia has improved but he still has to use his walker.  He also has leg pains which are described as mild to moderate and worse in the mornings. Endorses compliance with his antihypertensive. He has no other acute concerns today.   Past Medical History:  Diagnosis Date  . ETOH  abuse   . Hypertension    No Known Allergies  Current Outpatient Medications on File Prior to Visit  Medication Sig Dispense Refill  . amLODipine (NORVASC) 10 MG tablet Take 1 tablet (10 mg total) by mouth daily for 30 days. 30 tablet 0  . carvedilol (COREG) 12.5 MG tablet Take 1 tablet (12.5 mg total) by mouth 2 (two) times daily with a meal for 30 days. 60 tablet 0  . lisinopril (ZESTRIL) 10 MG tablet Take 1 tablet (10 mg total) by mouth daily for 30 doses. 30 tablet 0  . pantoprazole (PROTONIX) 40 MG tablet Take 1 tablet (40 mg total) by mouth daily at 6 (six) AM. 30 tablet 0  . thiamine 100 MG tablet Take 1 tablet (100 mg total) by mouth daily. 30 tablet 0  . hydrALAZINE (APRESOLINE) 25 MG tablet Take 2 tablets (50 mg total) by mouth 3 (three) times daily for 30 days. 120 tablet 0   No current facility-administered medications on file prior to visit.     Observations/Objective: Awake, alert, oriented x3 Not in acute distress  Assessment and Plan: 1. Alcohol use disorder, severe, dependence (HCC) Ongoing alcohol abuse but is working on quitting We will commence on naltrexone Discussed looking into AA meetings-this may be difficult at this time due to COVID-19 pandemic blood I have discussed with him the virtual meetings available - pantoprazole (PROTONIX) 40 MG tablet; Take 1 tablet (40 mg total) by mouth daily at 6 (six) AM.  Dispense: 30 tablet; Refill: 3 - naltrexone (DEPADE) 50 MG tablet; Take 1 tablet (50 mg total) by mouth daily.  Dispense:  30 tablet; Refill: 3 - thiamine 100 MG tablet; Take 1 tablet (100 mg total) by mouth daily.  Dispense: 30 tablet; Refill: 2  2. Essential hypertension Stable-advised to check blood pressures at home or at drug stores Continue antihypertensives Counseled on blood pressure goal of less than 130/80, low-sodium, DASH diet, medication compliance, 150 minutes of moderate intensity exercise per week. Discussed medication compliance, adverse  effects. - amLODipine (NORVASC) 10 MG tablet; Take 1 tablet (10 mg total) by mouth daily for 30 days.  Dispense: 30 tablet; Refill: 3 - lisinopril (ZESTRIL) 10 MG tablet; Take 1 tablet (10 mg total) by mouth daily for 30 doses.  Dispense: 30 tablet; Refill: 3 - carvedilol (COREG) 12.5 MG tablet; Take 1 tablet (12.5 mg total) by mouth 2 (two) times daily with a meal for 30 days.  Dispense: 60 tablet; Refill: 3 - hydrALAZINE (APRESOLINE) 50 MG tablet; Take 1 tablet (50 mg total) by mouth 3 (three) times daily for 30 days.  Dispense: 90 tablet; Refill: 3  3. 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 The patient was provided an opportunity to ask questions and all were answered. The patient agreed with the plan and demonstrated an understanding of the instructions.   The patient was advised to call back or seek an in-person evaluation if the symptoms worsen or if the condition fails to improve as anticipated.     I provided 20 minutes total of non-face-to-face time during this encounter including median intraservice time, reviewing previous notes, labs, imaging, medications, management and patient verbalized understanding.     Charlott Rakes, MD, FAAFP. [facility] and [address], [place]   12/27/2018, 9:52 AM

## 2019-04-05 ENCOUNTER — Ambulatory Visit: Payer: Self-pay | Admitting: Family Medicine

## 2019-05-29 ENCOUNTER — Other Ambulatory Visit: Payer: Self-pay

## 2019-05-29 DIAGNOSIS — Z20822 Contact with and (suspected) exposure to covid-19: Secondary | ICD-10-CM

## 2019-05-30 LAB — NOVEL CORONAVIRUS, NAA: SARS-CoV-2, NAA: NOT DETECTED

## 2020-01-09 IMAGING — CT CT ABD-PELV W/ CM
2 of 4 series · 15 of 46 positions shown, 17 images · IV contrast (APPLIED)
Comparison: None.

CLINICAL DATA: 100 mL isovue 300 given, tolerated well without
complication or reaction No PO contrast per PA order, not tolerating
liquids Pt with n/v, hematemesis, fever Symptoms x 3-4 days, no pain
at this time Hx HTN Labs within normal range^100mL N4XUBQ-[id number]
IOPAMIDOL ([id number]) INJECTION 61%Hematemesis

EXAM:
CT ABDOMEN AND PELVIS WITH CONTRAST
TECHNIQUE: Multidetector CT imaging of the abdomen and pelvis was performed
using the standard protocol following bolus administration of
intravenous contrast.
CONTRAST:  100mL [id number] IOPAMIDOL ([id number]-[id number]) INJECTION 61%

[Series 2: axial st · axial · 0.96mm/px · z∈[-516,-36]mm · 12 of 106 slices shown, 14 images]
[im 5/106  soft-tissue]
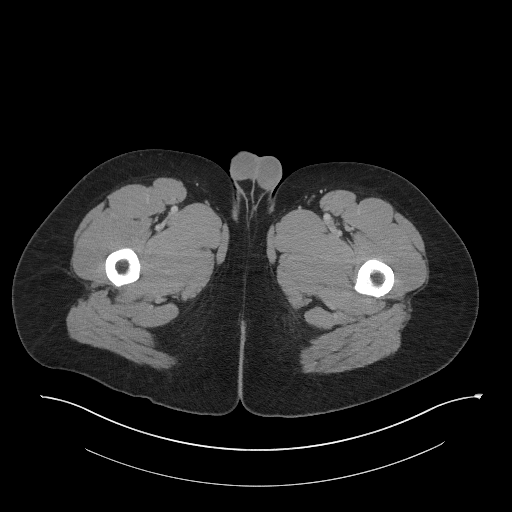
[im 5/106  bone]
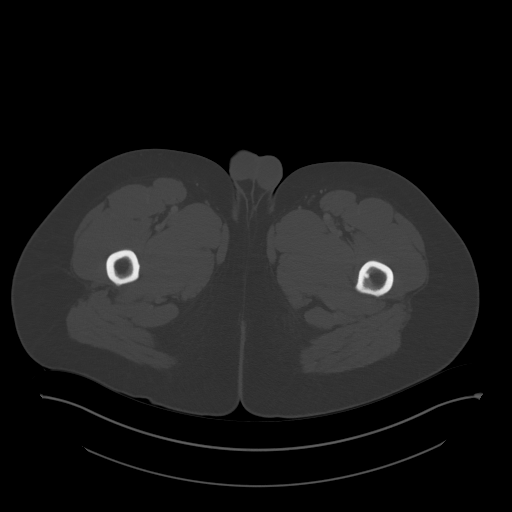
[im 14/106  soft-tissue]
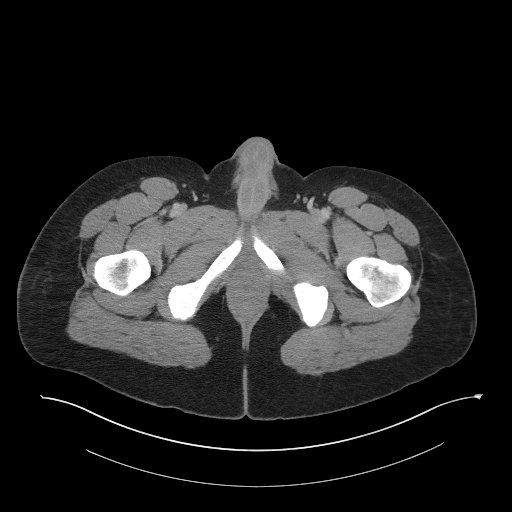
[im 22/106  soft-tissue]
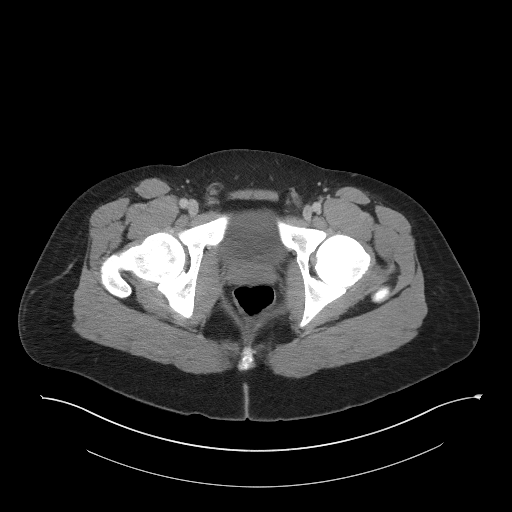
[im 31/106  soft-tissue]
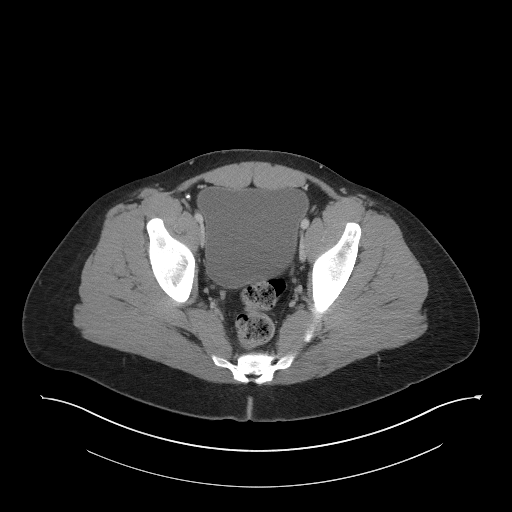
[im 40/106  soft-tissue]
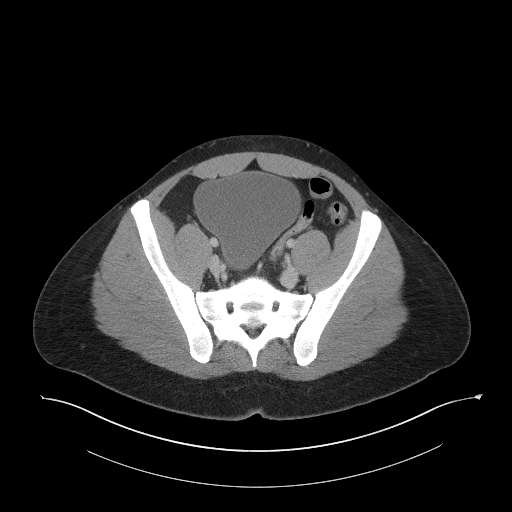
[im 49/106  soft-tissue]
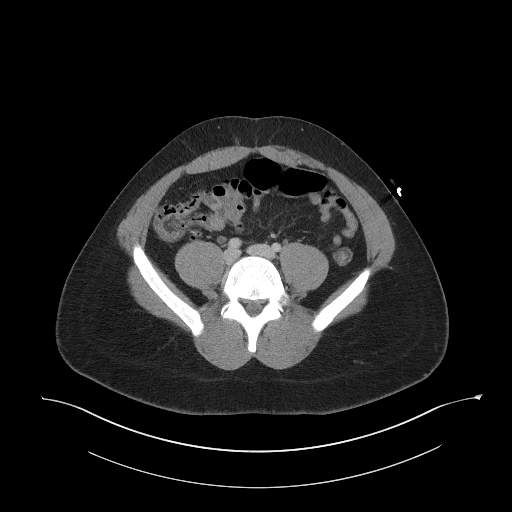
[im 57/106  soft-tissue]
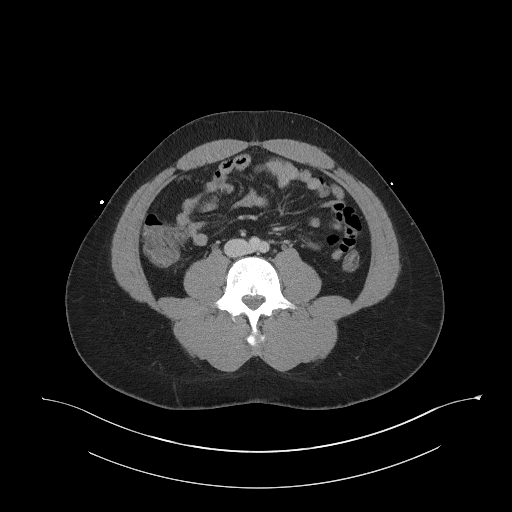
[im 66/106  soft-tissue]
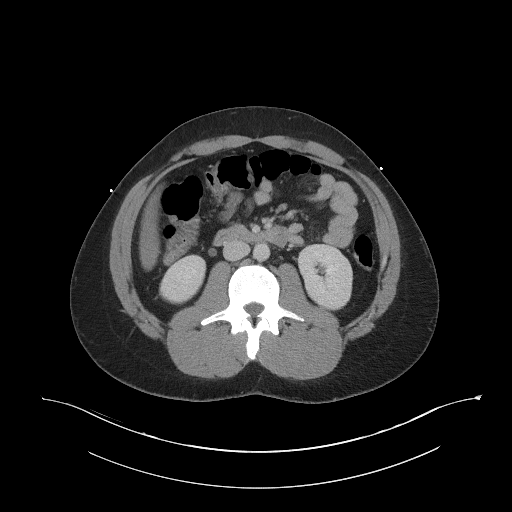
[im 75/106  soft-tissue]
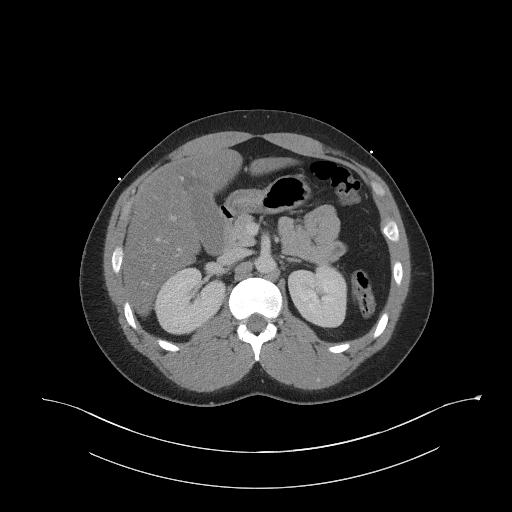
[im 75/106  bone]
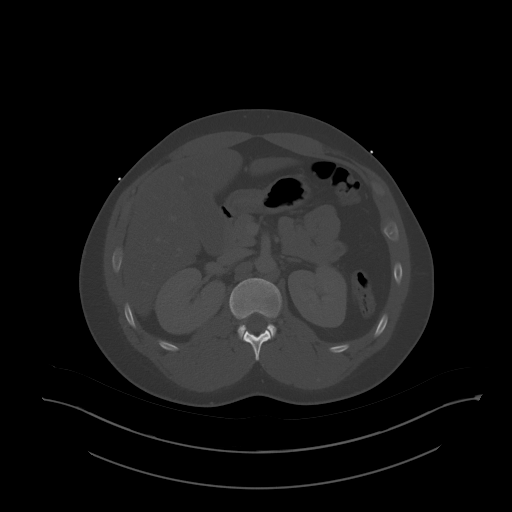
[im 84/106  soft-tissue]
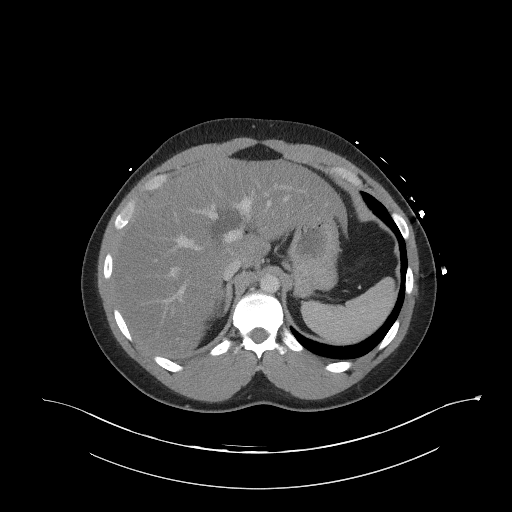
[im 92/106  soft-tissue]
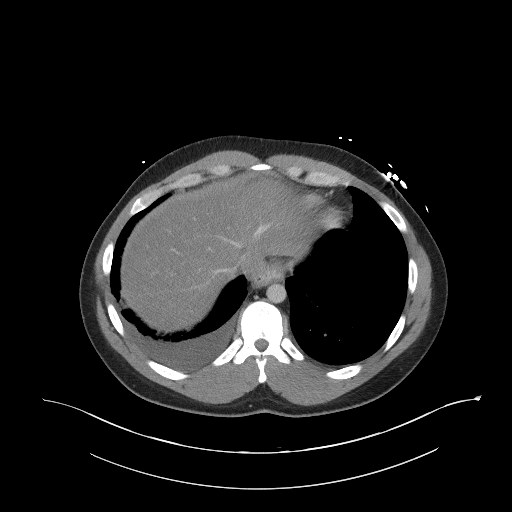
[im 101/106  soft-tissue]
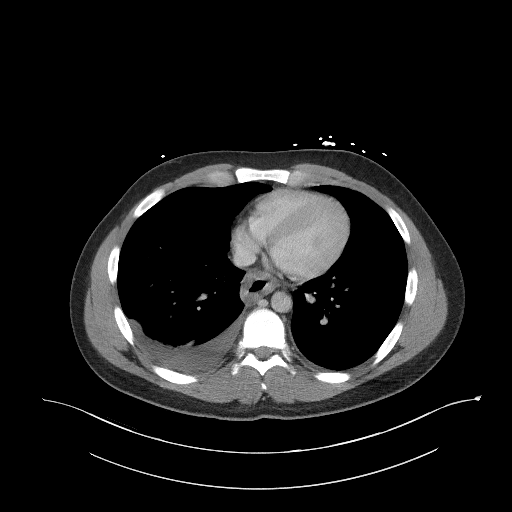

[Series 5: coronal st · coronal · 0.76mm/px · 3 of 101 slices shown]
[im 34/101  soft-tissue]
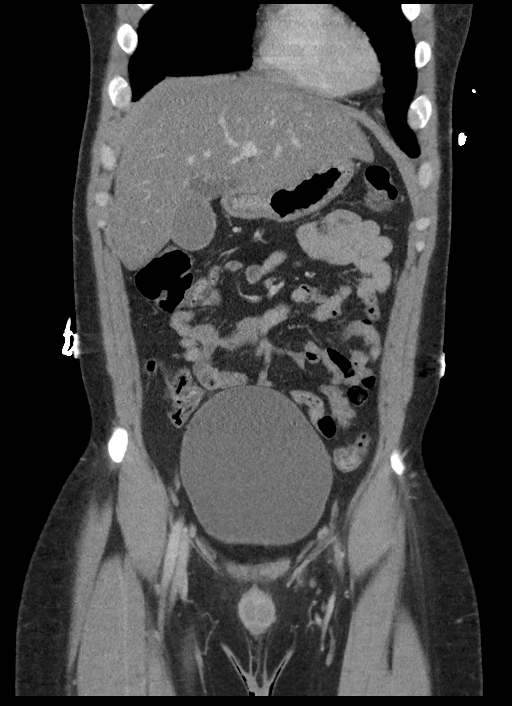
[im 45/101  soft-tissue]
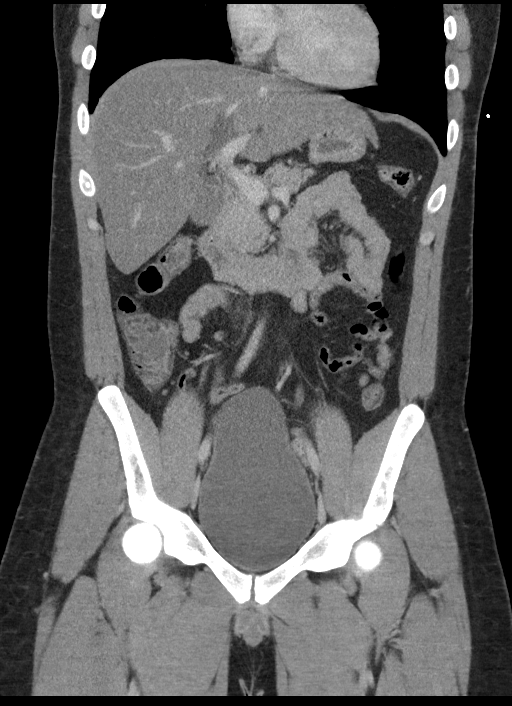
[im 56/101  soft-tissue]
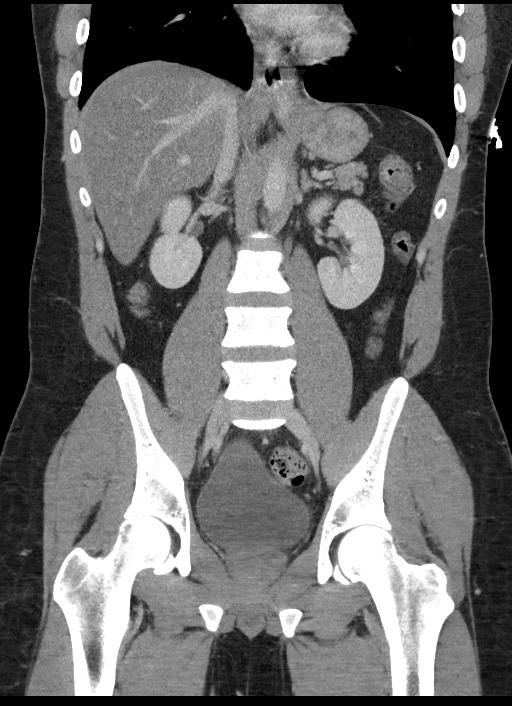

[15 of 46 positions shown; findings below may reference images not displayed]

FINDINGS: Lower chest: Moderate RIGHT effusion layers dependently in the RIGHT
hemithorax. Mild RIGHT basilar atelectasis. No pneumonitis or
pneumonia.

Hepatobiliary: Hypodense regions along the falciform ligament and
the gallbladder fossa most consistent fatty infiltration liver.
Liver overall is low-attenuation which could indicate hepatic
steatosis. Gallbladder normal. Normal common bile duct.

Pancreas: Pancreas is normal. No ductal dilatation. No pancreatic
inflammation.

Spleen: Normal spleen

Adrenals/urinary tract: Adrenal glands and kidneys are normal. The
ureters and bladder normal.

Stomach/Bowel: There is circumferential mural thickening of the
distal esophagus from GE junction superiorly to the upper limits of
the scan range (6 cm). Single wall thickness measures 9 mm (image
[DATE]). The stomach, duodenum, small-bowel appendix normal. The colon
and rectosigmoid colon normal.

Vascular/Lymphatic: Abdominal aorta is normal caliber. No periportal
or retroperitoneal adenopathy. No pelvic adenopathy.

Reproductive: Prostate normal

Other: No free fluid.

Musculoskeletal: No aggressive osseous lesion.
IMPRESSION: 1. Long segment of circumferential mucosal thickening of the distal
esophagus suggests esophagitis. Recommend clinical correlation and
consider upper endoscopy.
2. Moderate layering RIGHT pleural effusion.
3. Potential hepatic steatosis and fatty infiltration along the
gallbladder fossa and falciform ligament.

## 2021-01-25 ENCOUNTER — Other Ambulatory Visit: Payer: Self-pay

## 2021-01-25 ENCOUNTER — Emergency Department (HOSPITAL_COMMUNITY): Payer: Self-pay

## 2021-01-25 ENCOUNTER — Inpatient Hospital Stay (HOSPITAL_COMMUNITY)
Admission: EM | Admit: 2021-01-25 | Discharge: 2021-01-27 | DRG: 897 | Disposition: A | Discharge status: Home / Self Care | Payer: Self-pay | Attending: Internal Medicine | Admitting: Internal Medicine

## 2021-01-25 ENCOUNTER — Encounter (HOSPITAL_COMMUNITY): Payer: Self-pay

## 2021-01-25 DIAGNOSIS — T461X6A Underdosing of calcium-channel blockers, initial encounter: Secondary | ICD-10-CM | POA: Diagnosis present

## 2021-01-25 DIAGNOSIS — R569 Unspecified convulsions: Secondary | ICD-10-CM | POA: Diagnosis present

## 2021-01-25 DIAGNOSIS — F10939 Alcohol use, unspecified with withdrawal, unspecified: Secondary | ICD-10-CM | POA: Diagnosis present

## 2021-01-25 DIAGNOSIS — R7989 Other specified abnormal findings of blood chemistry: Secondary | ICD-10-CM | POA: Diagnosis present

## 2021-01-25 DIAGNOSIS — F10239 Alcohol dependence with withdrawal, unspecified: Principal | ICD-10-CM

## 2021-01-25 DIAGNOSIS — I10 Essential (primary) hypertension: Secondary | ICD-10-CM | POA: Diagnosis present

## 2021-01-25 DIAGNOSIS — K219 Gastro-esophageal reflux disease without esophagitis: Secondary | ICD-10-CM | POA: Diagnosis present

## 2021-01-25 DIAGNOSIS — T464X6A Underdosing of angiotensin-converting-enzyme inhibitors, initial encounter: Secondary | ICD-10-CM | POA: Diagnosis present

## 2021-01-25 DIAGNOSIS — Z20822 Contact with and (suspected) exposure to covid-19: Secondary | ICD-10-CM | POA: Diagnosis present

## 2021-01-25 DIAGNOSIS — Z91128 Patient's intentional underdosing of medication regimen for other reason: Secondary | ICD-10-CM

## 2021-01-25 DIAGNOSIS — F10231 Alcohol dependence with withdrawal delirium: Principal | ICD-10-CM | POA: Diagnosis present

## 2021-01-25 DIAGNOSIS — E871 Hypo-osmolality and hyponatremia: Secondary | ICD-10-CM | POA: Diagnosis present

## 2021-01-25 DIAGNOSIS — Z8249 Family history of ischemic heart disease and other diseases of the circulatory system: Secondary | ICD-10-CM

## 2021-01-25 LAB — URINALYSIS, ROUTINE W REFLEX MICROSCOPIC
Bacteria, UA: NONE SEEN
Glucose, UA: NEGATIVE mg/dL
Ketones, ur: >80 mg/dL — AB
Leukocytes,Ua: NEGATIVE
Nitrite: NEGATIVE
Protein, ur: >300 mg/dL — AB
Specific Gravity, Urine: 1.025 (ref 1.005–1.030)
pH: 6 (ref 5.0–8.0)

## 2021-01-25 LAB — LIPASE, BLOOD: Lipase: 35 U/L (ref 11–51)

## 2021-01-25 LAB — COMPREHENSIVE METABOLIC PANEL
ALT: 107 U/L — ABNORMAL HIGH (ref 0–44)
AST: 106 U/L — ABNORMAL HIGH (ref 15–41)
Albumin: 5.1 g/dL — ABNORMAL HIGH (ref 3.5–5.0)
Alkaline Phosphatase: 55 U/L (ref 38–126)
Anion gap: 17 — ABNORMAL HIGH (ref 5–15)
BUN: 9 mg/dL (ref 6–20)
CO2: 21 mmol/L — ABNORMAL LOW (ref 22–32)
Calcium: 9.9 mg/dL (ref 8.9–10.3)
Chloride: 96 mmol/L — ABNORMAL LOW (ref 98–111)
Creatinine, Ser: 1.01 mg/dL (ref 0.61–1.24)
GFR, Estimated: >60 mL/min (ref >60)
Glucose, Bld: 126 mg/dL — ABNORMAL HIGH (ref 70–99)
Potassium: 3.8 mmol/L (ref 3.5–5.1)
Sodium: 134 mmol/L — ABNORMAL LOW (ref 135–145)
Total Bilirubin: 1.3 mg/dL — ABNORMAL HIGH (ref 0.3–1.2)
Total Protein: 8.9 g/dL — ABNORMAL HIGH (ref 6.5–8.1)

## 2021-01-25 LAB — TROPONIN I (HIGH SENSITIVITY)
Troponin I (High Sensitivity): 6 ng/L (ref <18)
Troponin I (High Sensitivity): 11 ng/L (ref <18)

## 2021-01-25 LAB — CBC WITH DIFFERENTIAL/PLATELET
Abs Immature Granulocytes: 0.04 10*3/uL (ref 0.00–0.07)
Basophils Absolute: 0 10*3/uL (ref 0.0–0.1)
Basophils Relative: 0 %
Eosinophils Absolute: 0.1 10*3/uL (ref 0.0–0.5)
Eosinophils Relative: 1 %
HCT: 42.8 % (ref 39.0–52.0)
Hemoglobin: 14.4 g/dL (ref 13.0–17.0)
Immature Granulocytes: 1 %
Lymphocytes Relative: 11 %
Lymphs Abs: 0.7 10*3/uL (ref 0.7–4.0)
MCH: 34.4 pg — ABNORMAL HIGH (ref 26.0–34.0)
MCHC: 33.6 g/dL (ref 30.0–36.0)
MCV: 102.4 fL — ABNORMAL HIGH (ref 80.0–100.0)
Monocytes Absolute: 0.8 10*3/uL (ref 0.1–1.0)
Monocytes Relative: 12 %
Neutro Abs: 5.1 10*3/uL (ref 1.7–7.7)
Neutrophils Relative %: 75 %
Platelets: 222 10*3/uL (ref 150–400)
RBC: 4.18 MIL/uL — ABNORMAL LOW (ref 4.22–5.81)
RDW: 13 % (ref 11.5–15.5)
WBC: 6.7 10*3/uL (ref 4.0–10.5)
nRBC: 0 % (ref 0.0–0.2)

## 2021-01-25 LAB — HEPATITIS PANEL, ACUTE
HCV Ab: NONREACTIVE
Hep A IgM: NONREACTIVE
Hep B C IgM: NONREACTIVE
Hepatitis B Surface Ag: NONREACTIVE

## 2021-01-25 LAB — RESP PANEL BY RT-PCR (FLU A&B, COVID) ARPGX2
Influenza A by PCR: NEGATIVE
Influenza B by PCR: NEGATIVE
SARS Coronavirus 2 by RT PCR: NEGATIVE

## 2021-01-25 LAB — RAPID URINE DRUG SCREEN, HOSP PERFORMED
Amphetamines: NOT DETECTED
Barbiturates: NOT DETECTED
Benzodiazepines: NOT DETECTED
Cocaine: NOT DETECTED
Opiates: NOT DETECTED
Tetrahydrocannabinol: NOT DETECTED

## 2021-01-25 LAB — MAGNESIUM: Magnesium: 2.2 mg/dL (ref 1.7–2.4)

## 2021-01-25 LAB — PHOSPHORUS: Phosphorus: 1.6 mg/dL — ABNORMAL LOW (ref 2.5–4.6)

## 2021-01-25 LAB — PROTIME-INR
INR: 1 (ref 0.8–1.2)
Prothrombin Time: 13.5 seconds (ref 11.4–15.2)

## 2021-01-25 LAB — ETHANOL: Alcohol, Ethyl (B): <10 mg/dL (ref <10)

## 2021-01-25 MED ORDER — METOPROLOL TARTRATE 5 MG/5ML IV SOLN
5.0000 mg | Freq: Four times a day (QID) | INTRAVENOUS | Status: DC | PRN
  Filled 2021-01-25: qty 5

## 2021-01-25 MED ORDER — THIAMINE HCL 100 MG PO TABS
100.0000 mg | ORAL_TABLET | Freq: Every day | ORAL | Status: DC
  Administered 2021-01-25 – 2021-01-27 (×3): 100 mg via ORAL
  Filled 2021-01-25 (×3): qty 1

## 2021-01-25 MED ORDER — LEVETIRACETAM IN NACL 500 MG/100ML IV SOLN
500.0000 mg | Freq: Two times a day (BID) | INTRAVENOUS | Status: DC
  Administered 2021-01-25: 500 mg via INTRAVENOUS
  Filled 2021-01-25 (×2): qty 100

## 2021-01-25 MED ORDER — PANTOPRAZOLE SODIUM 40 MG PO TBEC
40.0000 mg | DELAYED_RELEASE_TABLET | Freq: Every day | ORAL | Status: DC
  Administered 2021-01-25 – 2021-01-27 (×3): 40 mg via ORAL
  Filled 2021-01-25 (×3): qty 1

## 2021-01-25 MED ORDER — THIAMINE HCL 100 MG/ML IJ SOLN
Freq: Once | INTRAVENOUS | Status: AC
  Filled 2021-01-25: qty 1000

## 2021-01-25 MED ORDER — ADULT MULTIVITAMIN W/MINERALS CH
1.0000 | ORAL_TABLET | Freq: Every day | ORAL | Status: DC
  Administered 2021-01-25 – 2021-01-27 (×3): 1 via ORAL
  Filled 2021-01-25 (×3): qty 1

## 2021-01-25 MED ORDER — LORAZEPAM 2 MG/ML IJ SOLN
2.0000 mg | Freq: Once | INTRAMUSCULAR | Status: AC
  Administered 2021-01-25: 2 mg via INTRAVENOUS
  Filled 2021-01-25: qty 1

## 2021-01-25 MED ORDER — THIAMINE HCL 100 MG/ML IJ SOLN: 100.0000 mg | Freq: Every day | INTRAMUSCULAR | Status: DC

## 2021-01-25 MED ORDER — LORAZEPAM 2 MG/ML IJ SOLN
0.0000 mg | Freq: Four times a day (QID) | INTRAMUSCULAR | Status: DC
  Administered 2021-01-25: 1 mg via INTRAVENOUS
  Administered 2021-01-25: 3 mg via INTRAVENOUS
  Administered 2021-01-26 – 2021-01-27 (×3): 2 mg via INTRAVENOUS
  Filled 2021-01-25: qty 2
  Filled 2021-01-25 (×2): qty 1
  Filled 2021-01-25: qty 2
  Filled 2021-01-25: qty 1

## 2021-01-25 MED ORDER — AMLODIPINE BESYLATE 10 MG PO TABS
10.0000 mg | ORAL_TABLET | Freq: Every day | ORAL | Status: DC
  Administered 2021-01-25 – 2021-01-27 (×3): 10 mg via ORAL
  Filled 2021-01-25: qty 1
  Filled 2021-01-25: qty 2
  Filled 2021-01-25: qty 1

## 2021-01-25 MED ORDER — ONDANSETRON HCL 4 MG PO TABS: 4.0000 mg | ORAL_TABLET | Freq: Four times a day (QID) | ORAL | Status: DC | PRN

## 2021-01-25 MED ORDER — LEVETIRACETAM IN NACL 1500 MG/100ML IV SOLN: 1500.0000 mg | Freq: Two times a day (BID) | INTRAVENOUS | Status: DC

## 2021-01-25 MED ORDER — SODIUM CHLORIDE 0.9 % IV BOLUS
1000.0000 mL | Freq: Once | INTRAVENOUS | Status: AC
  Administered 2021-01-25: 1000 mL via INTRAVENOUS

## 2021-01-25 MED ORDER — ACETAMINOPHEN 650 MG RE SUPP: 650.0000 mg | Freq: Four times a day (QID) | RECTAL | Status: DC | PRN

## 2021-01-25 MED ORDER — ONDANSETRON HCL 4 MG/2ML IJ SOLN: 4.0000 mg | Freq: Four times a day (QID) | INTRAMUSCULAR | Status: DC | PRN

## 2021-01-25 MED ORDER — ACETAMINOPHEN 325 MG PO TABS: 650.0000 mg | ORAL_TABLET | Freq: Four times a day (QID) | ORAL | Status: DC | PRN

## 2021-01-25 MED ORDER — LORAZEPAM 1 MG PO TABS
1.0000 mg | ORAL_TABLET | ORAL | Status: DC | PRN
  Administered 2021-01-25 – 2021-01-26 (×3): 2 mg via ORAL
  Administered 2021-01-26: 1 mg via ORAL
  Filled 2021-01-25 (×2): qty 2
  Filled 2021-01-25: qty 1
  Filled 2021-01-25: qty 2

## 2021-01-25 MED ORDER — LISINOPRIL 20 MG PO TABS
20.0000 mg | ORAL_TABLET | Freq: Every day | ORAL | Status: DC
  Administered 2021-01-25 – 2021-01-27 (×3): 20 mg via ORAL
  Filled 2021-01-25 (×3): qty 1

## 2021-01-25 MED ORDER — SODIUM PHOSPHATES 45 MMOLE/15ML IV SOLN
20.0000 mmol | Freq: Once | INTRAVENOUS | Status: AC
  Administered 2021-01-25: 20 mmol via INTRAVENOUS
  Filled 2021-01-25: qty 6.67

## 2021-01-25 MED ORDER — SODIUM CHLORIDE 0.9 % IV SOLN: INTRAVENOUS | Status: DC

## 2021-01-25 MED ORDER — LORAZEPAM 2 MG/ML IJ SOLN: 0.0000 mg | Freq: Two times a day (BID) | INTRAMUSCULAR | Status: DC

## 2021-01-25 MED ORDER — LORAZEPAM 2 MG/ML IJ SOLN
1.0000 mg | INTRAMUSCULAR | Status: DC | PRN
Start: 2021-01-25 — End: 2021-01-27
  Administered 2021-01-26 – 2021-01-27 (×3): 2 mg via INTRAVENOUS
  Filled 2021-01-25 (×3): qty 1

## 2021-01-25 MED ORDER — LEVETIRACETAM IN NACL 1500 MG/100ML IV SOLN
1500.0000 mg | Freq: Once | INTRAVENOUS | Status: AC
  Administered 2021-01-25: 1500 mg via INTRAVENOUS
  Filled 2021-01-25: qty 100

## 2021-01-25 MED ORDER — FOLIC ACID 1 MG PO TABS
1.0000 mg | ORAL_TABLET | Freq: Every day | ORAL | Status: DC
  Administered 2021-01-25 – 2021-01-27 (×3): 1 mg via ORAL
  Filled 2021-01-25 (×3): qty 1

## 2021-01-25 NOTE — ED Provider Notes (Signed)
[facility]-EMERGENCY DEPT Provider Note   CSN: [id number] Arrival date & time: 01/25/21  6301     History Chief Complaint  Patient presents with   Seizures    Brett Molina is a 30 y.o. male.  30 year old male with prior medical history as detailed below presents for evaluation.  Patient arrives by EMS from home.  Patient's girlfriend apparently called EMS after reported seizure activity.  Patient is denying prior history of seizure.  He is not currently taking antiepileptic medications.  Patient with well-established history of heavy alcohol use.  EMS reported that his last drink was yesterday morning.  Patient is reporting that his last drink was 4 days prior.  He is noted to be tachycardic and hypertensive on arrival.  He is tremorous.  Patient denies pain or fever.  He denies associated shortness of breath or chest pain.  He denies recent nausea, vomiting, or bleeding.  The history is provided by the patient, the EMS personnel and medical records.  Illness Location:  Reported seizure activity, tremor, hypertension, tachycardia, suspected alcohol withdrawal Severity:  Severe Onset quality:  Unable to specify Timing:  Unable to specify Progression:  Unchanged Chronicity:  New Associated symptoms: no abdominal pain, no chest pain, no fever and no shortness of breath       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/12/2016   MDD (major depressive  disorder) 12/12/2016    Past Surgical History:  Procedure Laterality Date   ESOPHAGOGASTRODUODENOSCOPY N/A 07/24/2018   Procedure: ESOPHAGOGASTRODUODENOSCOPY (EGD);  Surgeon: R[facility], MD;  Location: [facility];  Service: Endoscopy;  Laterality: N/A;       Family History  Problem Relation Age of Onset   Hypertension Mother    Hypertension Father     Social History   Tobacco Use   Smoking status: Never   Smokeless tobacco: Never  Vaping Use   Vaping Use: Never used  Substance Use Topics   Alcohol use: Yes    Alcohol/week: 14.0 standard drinks    Types: 14 Standard drinks or equivalent per week    Comment: 1/2 gallon of wine PTA   Drug use: No    Home Medications Prior to Admission medications   Medication Sig Start Date End Date Taking? Authorizing Provider  amLODipine (NORVASC) 10 MG tablet Take 1 tablet (10 mg total) by mouth daily for 30 days. 12/27/18 01/26/19  Hoy Register, MD  carvedilol (COREG) 12.5 MG tablet Take 1 tablet (12.5 mg total) by mouth 2 (two) times daily with a meal for 30 days. 12/27/18 01/26/19  Hoy Register, MD  hydrALAZINE (APRESOLINE) 50 MG tablet Take 1 tablet (50 mg total) by mouth 3 (three) times daily for 30 days. 12/27/18 01/26/19  Hoy Register, MD  lisinopril (ZESTRIL) 10 MG tablet Take 1 tablet (10 mg total) by mouth daily for 30 doses. 12/27/18 01/26/19  Hoy Register, MD  naltrexone (DEPADE) 50 MG tablet Take 1 tablet (50 mg  total) by mouth daily. 12/27/18   Hoy Register, MD  pantoprazole (PROTONIX) 40 MG tablet Take 1 tablet (40 mg total) by mouth daily at 6 (six) AM. 12/27/18   Hoy Register, MD  thiamine 100 MG tablet Take 1 tablet (100 mg total) by mouth daily. 12/27/18   Hoy Register, MD    Allergies    Patient has no known allergies.  Review of Systems   Review of Systems  Constitutional:  Negative for fever.  Respiratory:  Negative for shortness of breath.   Cardiovascular:  Negative for chest pain.   Gastrointestinal:  Negative for abdominal pain.  All other systems reviewed and are negative.  Physical Exam Updated Vital Signs BP (!) 151/104   Pulse (!) 130   Temp 98.4 F (36.9 C) (Oral)   Resp (!) 22   SpO2 94%   Physical Exam Vitals and nursing note reviewed.  Constitutional:      General: He is not in acute distress.    Appearance: Normal appearance. He is well-developed.  HENT:     Head: Normocephalic and atraumatic.  Eyes:     Conjunctiva/sclera: Conjunctivae normal.     Pupils: Pupils are equal, round, and reactive to light.  Cardiovascular:     Rate and Rhythm: Regular rhythm. Tachycardia present.     Heart sounds: Normal heart sounds.  Pulmonary:     Effort: Pulmonary effort is normal. No respiratory distress.     Breath sounds: Normal breath sounds.  Abdominal:     General: There is no distension.     Palpations: Abdomen is soft.     Tenderness: There is no abdominal tenderness.  Musculoskeletal:        General: No deformity. Normal range of motion.     Cervical back: Normal range of motion and neck supple.  Skin:    General: Skin is warm and dry.  Neurological:     General: No focal deficit present.     Mental Status: He is alert and oriented to person, place, and time.     Comments: Shaky and tremorous    ED Results / Procedures / Treatments   Labs (all labs ordered are listed, but only abnormal results are displayed) Labs Reviewed  RESP PANEL BY RT-PCR (FLU A&B, COVID) ARPGX2  RAPID URINE DRUG SCREEN, HOSP PERFORMED  URINALYSIS, ROUTINE W REFLEX MICROSCOPIC  ETHANOL  CBC WITH DIFFERENTIAL/PLATELET  PROTIME-INR  COMPREHENSIVE METABOLIC PANEL  MAGNESIUM  PHOSPHORUS    EKG None  Radiology No results found.  Procedures Procedures   Medications Ordered in ED Medications  sodium chloride 0.9 % bolus 1,000 mL (1,000 mLs Intravenous New Bag/Given 01/25/21 0854)  LORazepam (ATIVAN) injection 2 mg (2 mg Intravenous Given 01/25/21 0854)     ED Course  I have reviewed the triage vital signs and the nursing notes.  Pertinent labs & imaging results that were available during my care of the patient were reviewed by me and considered in my medical decision making (see chart for details).    MDM Rules/Calculators/A&P                           MDM  MSE complete  Stanislaw Acton Calip was evaluated in Emergency Department on 01/25/2021 for the symptoms described in the history of present illness. He was evaluated in the context of the global COVID-19 pandemic, which necessitated consideration that the patient might be at risk for infection with the SARS-CoV-2 virus that  causes COVID-19. Institutional protocols and algorithms that pertain to the evaluation of patients at risk for COVID-19 are in a state of rapid change based on information released by regulatory bodies including the CDC and federal and state organizations. These policies and algorithms were followed during the patient's care in the ED.  Patient with single seizure like episode witnessed at home by his wife.  This was self resolved after 2 to 3 minutes.  Patient with significant well-documented history of heavy alcohol use and abuse.  Patient's last reported alcohol intake was yesterday at 11 AM.  Patient without witnessed seizure activity here in the ED.  Patient is noted to be hypertensive, tachycardic, and tremulous.  Presentation is concerning for alcohol withdrawal.  The patient apparently decided yesterday that he would try to "get sober."  The patient would benefit from admission for further work-up and treatment.  Hospitalist service is aware of case and will evaluate for same.       Final Clinical Impression(s) / ED Diagnoses Final diagnoses:  Alcohol withdrawal syndrome with complication [facility])    Rx / DC Orders ED Discharge Orders     None        Wynetta Fines, MD 01/25/21 1023

## 2021-01-25 NOTE — ED Notes (Signed)
Pt has been sleeping since last dose of Ativan.  Tremors have slightly improved.

## 2021-01-25 NOTE — ED Notes (Signed)
Patient transported to CT 

## 2021-01-25 NOTE — H&P (Addendum)
History and Physical    Brett Molina FTD:[id number] DOB: 1990/07/10 DOA: 01/25/2021  PCP: Hoy Register, MD  Patient coming from: Home  Chief Complaint: AMS  HPI: Brett Molina is a 30 y.o. male with medical history significant of HTN, alcohol abuse. Presenting with altered mental status. History from family. Wife report that patient is trying to detox from alcohol. Now for the last 24 hours he has been free of alcohol. She heard a scream from another room. She found him in bed and found him w/ bloody foaming from mouth. He would not talk. He was shaking. It lasted 2 - 3 minutes. It was another 3 minutes before he was able to recognize his wife. He was still sluggish however. EMS was called. The patient was brought to the ED. He has had a similar event in 2020.  Family is not sure if he became incontinent. The patient reports he wokr up as normal around 0600hrs. to listen to podcasts. He did not feel out of sorts. His wife reports that she called 911 at 57hrs w/ this event.   ED Course: He was found to be significantly tremulous. He was given 4mg  ativan. CTH was negative. He was found to be hypertensive. [facility] was called for admission.   Review of Systems:  Denies CP, dyspnea, palpitations, N/V/D, fever, heralding symptoms. Review of systems is otherwise negative for all not mentioned in HPI.   PMHx Past Medical History:  Diagnosis Date   ETOH abuse    Hypertension     PSHx Past Surgical History:  Procedure Laterality Date   ESOPHAGOGASTRODUODENOSCOPY N/A 07/24/2018   Procedure: ESOPHAGOGASTRODUODENOSCOPY (EGD);  Surgeon: 07/26/2018, MD;  [facility];  Service: Endoscopy;  Laterality: N/A;    SocHx  reports that he has never smoked. He has never used smokeless tobacco. He reports current alcohol use of about 14.0 standard drinks of alcohol per week. He reports that he does not use drugs.  No Known Allergies  FamHx Family History  Problem Relation Age  of Onset   Hypertension Mother    Hypertension Father     Prior to Admission medications   Medication Sig Start Date End Date Taking? Authorizing Provider  amLODipine (NORVASC) 10 MG tablet Take 1 tablet (10 mg total) by mouth daily for 30 days. 12/27/18 01/26/19  01/28/19, MD  carvedilol (COREG) 12.5 MG tablet Take 1 tablet (12.5 mg total) by mouth 2 (two) times daily with a meal for 30 days. 12/27/18 01/26/19  01/28/19, MD  hydrALAZINE (APRESOLINE) 50 MG tablet Take 1 tablet (50 mg total) by mouth 3 (three) times daily for 30 days. 12/27/18 01/26/19  01/28/19, MD  lisinopril (ZESTRIL) 10 MG tablet Take 1 tablet (10 mg total) by mouth daily for 30 doses. 12/27/18 01/26/19  01/28/19, MD  naltrexone (DEPADE) 50 MG tablet Take 1 tablet (50 mg total) by mouth daily. 12/27/18   12/29/18, MD  pantoprazole (PROTONIX) 40 MG tablet Take 1 tablet (40 mg total) by mouth daily at 6 (six) AM. 12/27/18   12/29/18, MD  thiamine 100 MG tablet Take 1 tablet (100 mg total) by mouth daily. 12/27/18   12/29/18, MD    Physical Exam: Vitals:   01/25/21 0844 01/25/21 0910 01/25/21 0940 01/25/21 1000  BP:  (!) 154/104 (!) 160/128 (!) 177/138  Pulse:  88 93 (!) 105  Resp: (!) 22 17 (!) 21 20  Temp:      TempSrc:  SpO2:  99% 100% 93%    General: 30 y.o. male resting in bed in NAD, tremulous Eyes: PERRL, normal sclera ENMT: Nares patent w/o discharge, orophaynx clear, dentition normal, ears w/o discharge/lesions/ulcers Neck: Supple, trachea midline Cardiovascular: RRR, +S1, S2, no m/g/r, equal pulses throughout Respiratory: CTABL, no w/r/r, normal WOB GI: BS+, NDNT, no masses noted, no organomegaly noted MSK: No e/c/c Skin: No rashes, bruises, ulcerations noted Neuro: A&O x 3, no focal deficits Psyc: Appropriate interaction and affect, cooperative, tremulous  Labs on Admission: I have personally reviewed following labs and imaging studies  CBC: Recent Labs   Lab 01/25/21 0850  WBC 6.7  NEUTROABS 5.1  HGB 14.4  HCT 42.8  MCV 102.4*  PLT 222   Basic Metabolic Panel: Recent Labs  Lab 01/25/21 0850 01/25/21 0859  NA  --  134*  K  --  3.8  CL  --  96*  CO2  --  21*  GLUCOSE  --  126*  BUN  --  9  CREATININE  --  1.01  CALCIUM  --  9.9  MG 2.2  --   PHOS 1.6*  --    GFR: CrCl cannot be calculated (Unknown ideal weight.). Liver Function Tests: Recent Labs  Lab 01/25/21 0859  AST 106*  ALT 107*  ALKPHOS 55  BILITOT 1.3*  PROT 8.9*  ALBUMIN 5.1*   Recent Labs  Lab 01/25/21 0859  LIPASE 35   No results for input(s): AMMONIA in the last 168 hours. Coagulation Profile: Recent Labs  Lab 01/25/21 0850  INR 1.0   Cardiac Enzymes: No results for input(s): CKTOTAL, CKMB, CKMBINDEX, TROPONINI in the last 168 hours. BNP (last 3 results) No results for input(s): PROBNP in the last 8760 hours. HbA1C: No results for input(s): HGBA1C in the last 72 hours. CBG: No results for input(s): GLUCAP in the last 168 hours. Lipid Profile: No results for input(s): CHOL, HDL, LDLCALC, TRIG, CHOLHDL, LDLDIRECT in the last 72 hours. Thyroid Function Tests: No results for input(s): TSH, T4TOTAL, FREET4, T3FREE, THYROIDAB in the last 72 hours. Anemia Panel: No results for input(s): VITAMINB12, FOLATE, FERRITIN, TIBC, IRON, RETICCTPCT in the last 72 hours. Urine analysis:    Component Value Date/Time   COLORURINE YELLOW 12/02/2018 1008   APPEARANCEUR HAZY (A) 12/02/2018 1008   LABSPEC 1.030 12/02/2018 1008   PHURINE 6.0 12/02/2018 1008   GLUCOSEU NEGATIVE 12/02/2018 1008   HGBUR NEGATIVE 12/02/2018 1008   BILIRUBINUR NEGATIVE 12/02/2018 1008   KETONESUR 5 (A) 12/02/2018 1008   PROTEINUR NEGATIVE 12/02/2018 1008   NITRITE NEGATIVE 12/02/2018 1008   LEUKOCYTESUR NEGATIVE 12/02/2018 1008    Radiological Exams on Admission: CT Head Wo Contrast  Result Date: 01/25/2021 CLINICAL DATA:  Seizure, nontraumatic. EXAM: CT HEAD WITHOUT  CONTRAST TECHNIQUE: Contiguous axial images were obtained from the base of the skull through the vertex without intravenous contrast. COMPARISON:  11/19/2018 FINDINGS: Brain: No evidence of acute infarction, hemorrhage, hydrocephalus, extra-axial collection or mass lesion/mass effect. Vascular: No hyperdense vessel or unexpected calcification. Skull: Normal. Negative for fracture or focal lesion. Sinuses/Orbits: No acute finding. Other: None. IMPRESSION: Negative head CT. Electronically Signed   By: Richarda Overlie M.D.   On: 01/25/2021 09:44    EKG: Independently reviewed. Sinus, 39mm st elevation in Avl  Assessment/Plan EtOH abuse EtOH withdrawal     - admit to inpt, progressive     - possible seizure at home, but none witnessed here     - will have him on seizure  precautions w/ CIWA     - get banana bag, dialy MVI, thiamine, folate     - counsel against further EtOH use     - will also load keppra and schedule keppra; continue that medication at discharge until he has follow up with neuro (spoke with neuro about this)     - can have CLD for now; advance as he becomes more stable  Hyponatremia Hypophosphatemia     - Na+ is mildly low, will be getting fluids, follow     - replace Phos  HTN     - add lisinopril, amlodipine and PRN hydralazine  Elevated LFTs     - check hepatitis panel; follow  GERD     - PPI  DVT prophylaxis: SCDs  Code Status: FULL  Family Communication: w/ wife at bedside  Consults called: None   Status is: Inpatient  Remains inpatient appropriate because:Inpatient level of care appropriate due to severity of illness  Dispo: The patient is from: Home              Anticipated d/c is to: Home              Patient currently is not medically stable to d/c.   Difficult to place patient No  Time spent coordinating admission: 65 minutes  Yomayra Tate A Kashana Breach DO [facility]  If 7PM-7AM, please contact night-coverage [email or web address]  01/25/2021, 10:23 AM

## 2021-01-25 NOTE — ED Notes (Signed)
Provider at bedside

## 2021-01-25 NOTE — ED Triage Notes (Signed)
Pt arrived via EMS, from home, witnessed seizure, per wife, pt has heavy daily alcohol use. Last drink was yesterday morning. Hx of alcohol withdrawal seizures in the past.

## 2021-01-26 DIAGNOSIS — F10231 Alcohol dependence with withdrawal delirium: Principal | ICD-10-CM

## 2021-01-26 LAB — COMPREHENSIVE METABOLIC PANEL
ALT: 84 U/L — ABNORMAL HIGH (ref 0–44)
AST: 98 U/L — ABNORMAL HIGH (ref 15–41)
Albumin: 4.2 g/dL (ref 3.5–5.0)
Alkaline Phosphatase: 44 U/L (ref 38–126)
Anion gap: 10 (ref 5–15)
BUN: 8 mg/dL (ref 6–20)
CO2: 24 mmol/L (ref 22–32)
Calcium: 8.7 mg/dL — ABNORMAL LOW (ref 8.9–10.3)
Chloride: 103 mmol/L (ref 98–111)
Creatinine, Ser: 0.81 mg/dL (ref 0.61–1.24)
GFR, Estimated: >60 mL/min (ref >60)
Glucose, Bld: 87 mg/dL (ref 70–99)
Potassium: 3.3 mmol/L — ABNORMAL LOW (ref 3.5–5.1)
Sodium: 137 mmol/L (ref 135–145)
Total Bilirubin: 1.4 mg/dL — ABNORMAL HIGH (ref 0.3–1.2)
Total Protein: 7.2 g/dL (ref 6.5–8.1)

## 2021-01-26 LAB — CBC
HCT: 36.8 % — ABNORMAL LOW (ref 39.0–52.0)
Hemoglobin: 11.9 g/dL — ABNORMAL LOW (ref 13.0–17.0)
MCH: 33.7 pg (ref 26.0–34.0)
MCHC: 32.3 g/dL (ref 30.0–36.0)
MCV: 104.2 fL — ABNORMAL HIGH (ref 80.0–100.0)
Platelets: 174 10*3/uL (ref 150–400)
RBC: 3.53 MIL/uL — ABNORMAL LOW (ref 4.22–5.81)
RDW: 12.9 % (ref 11.5–15.5)
WBC: 5.5 10*3/uL (ref 4.0–10.5)
nRBC: 0 % (ref 0.0–0.2)

## 2021-01-26 LAB — HIV ANTIBODY (ROUTINE TESTING W REFLEX): HIV Screen 4th Generation wRfx: NONREACTIVE

## 2021-01-26 MED ORDER — MAGNESIUM OXIDE -MG SUPPLEMENT 400 (240 MG) MG PO TABS
800.0000 mg | ORAL_TABLET | Freq: Two times a day (BID) | ORAL | Status: DC
  Administered 2021-01-26 – 2021-01-27 (×3): 800 mg via ORAL
  Filled 2021-01-26 (×3): qty 2

## 2021-01-26 MED ORDER — LEVETIRACETAM 500 MG PO TABS
500.0000 mg | ORAL_TABLET | Freq: Two times a day (BID) | ORAL | Status: DC
  Administered 2021-01-26 – 2021-01-27 (×3): 500 mg via ORAL
  Filled 2021-01-26 (×3): qty 1

## 2021-01-26 MED ORDER — POTASSIUM CHLORIDE CRYS ER 20 MEQ PO TBCR
40.0000 meq | EXTENDED_RELEASE_TABLET | Freq: Two times a day (BID) | ORAL | Status: DC
Start: 2021-01-26 — End: 2021-01-27
  Administered 2021-01-26 – 2021-01-27 (×3): 40 meq via ORAL
  Filled 2021-01-26 (×3): qty 2

## 2021-01-26 NOTE — Plan of Care (Signed)

## 2021-01-26 NOTE — Progress Notes (Signed)
PROGRESS NOTE    Brett Molina  WIO:[id number] DOB: [birth year] DOA: 01/25/2021 PCP: Hoy Register, MD    Brief Narrative:  30 year old gentleman with history of hypertension and alcohol abuse brought to the emergency room with altered mental status and seizure-like episode.  Patient is trying to detox himself at home so he did not drink alcohol for about 24 hours, wife found him confused with bloody foaming from mouth and not talking or shaking.  Brought to the emergency room by EMS.  Had 1 similar episode in 2020.  No evidence of incontinence or trauma.  In the emergency room significantly tremulous with evidence of alcohol withdrawal.  CT head was not done and that was without any acute findings.  Admitted with alcohol withdrawal seizures.   Assessment & Plan:   Active Problems:   Alcohol withdrawal (HCC)  Delirium tremens due to alcohol withdrawal/alcohol withdrawal seizure: Patient is hemodynamically stabilizing today. Remains on high-dose benzodiazepines with CIWA scale.  All-time fall precautions delirium precautions. Suspected seizure, loaded with Keppra in the emergency room, will continue oral Keppra and referred to outpatient neurology follow-up. No recurrence of events since admission, unlikely EEG will be any diagnostic. Start mobilizing with precautions.  Hyponatremia: Dilutional hyponatremia.  Stable.  Hypophosphatemia/hypomagnesemia: Replaced aggressively.  Recheck levels tomorrow morning.  Essential hypertension: Well-controlled.  On lisinopril and amlodipine.  GERD: On PPI.  DVT prophylaxis: SCDs Start: 01/25/21 1803   Code Status: Full code Family Communication: None.  Wife called, unable to pick up phone Disposition Plan: Status is: Inpatient  Remains inpatient appropriate because:Inpatient level of care appropriate due to severity of illness  Dispo: The patient is from: Home              Anticipated d/c is to: Home              Patient currently  is not medically stable to d/c.   Difficult to place patient No         Consultants:  None  Procedures:  None  Antimicrobials:  None   Subjective: Patient seen and examined.  He is awake and alert, wants to walk around with some help.  Is tremulous.  Denies any nausea vomiting or abdominal pain.  No witnessed seizures since being in the hospital.  Objective: Vitals:   01/25/21 2202 01/26/21 0207 01/26/21 0559 01/26/21 0749  BP:  (!) 146/84 139/69 (!) 156/68  Pulse:  96 85 (!) 101  Resp:  17 16 20   Temp:  98.6 F (37 C) 98.4 F (36.9 C) 98.3 F (36.8 C)  TempSrc:  Oral Oral Oral  SpO2:  100% 99% 99%  Weight: 112.6 kg     Height: 6\' 2"  (1.88 m)       Intake/Output Summary (Last 24 hours) at 01/26/2021 1111 Last data filed at 01/26/2021 0451 Gross per 24 hour  Intake 1704.77 ml  Output 775 ml  Net 929.77 ml   Filed Weights   01/25/21 2202  Weight: 112.6 kg    Examination:  General exam: Appears anxious and tremulous.  On room air.  Quite comfortable on conversation. Respiratory system: Clear to auscultation. Respiratory effort normal. Cardiovascular system: S1 & S2 heard, RRR.  No edema.   Gastrointestinal system: Abdomen is nondistended, soft and nontender. No organomegaly or masses felt. Normal bowel sounds heard. Central nervous system: Alert and oriented. No focal neurological deficits. Extremities: Symmetric 5 x 5 power.     Data Reviewed: I have personally reviewed following labs and imaging  studies  CBC: Recent Labs  Lab 01/25/21 0850 01/26/21 0347  WBC 6.7 5.5  NEUTROABS 5.1  --   HGB 14.4 11.9*  HCT 42.8 36.8*  MCV 102.4* 104.2*  PLT 222 174   Basic Metabolic Panel: Recent Labs  Lab 01/25/21 0850 01/25/21 0859 01/26/21 0347  NA  --  134* 137  K  --  3.8 3.3*  CL  --  96* 103  CO2  --  21* 24  GLUCOSE  --  126* 87  BUN  --  9 8  CREATININE  --  1.01 0.81  CALCIUM  --  9.9 8.7*  MG 2.2  --   --   PHOS 1.6*  --   --     GFR: Estimated Creatinine Clearance: 174.8 mL/min (by C-G formula based on SCr of 0.81 mg/dL). Liver Function Tests: Recent Labs  Lab 01/25/21 0859 01/26/21 0347  AST 106* 98*  ALT 107* 84*  ALKPHOS 55 44  BILITOT 1.3* 1.4*  PROT 8.9* 7.2  ALBUMIN 5.1* 4.2   Recent Labs  Lab 01/25/21 0859  LIPASE 35   No results for input(s): AMMONIA in the last 168 hours. Coagulation Profile: Recent Labs  Lab 01/25/21 0850  INR 1.0   Cardiac Enzymes: No results for input(s): CKTOTAL, CKMB, CKMBINDEX, TROPONINI in the last 168 hours. BNP (last 3 results) No results for input(s): PROBNP in the last 8760 hours. HbA1C: No results for input(s): HGBA1C in the last 72 hours. CBG: No results for input(s): GLUCAP in the last 168 hours. Lipid Profile: No results for input(s): CHOL, HDL, LDLCALC, TRIG, CHOLHDL, LDLDIRECT in the last 72 hours. Thyroid Function Tests: No results for input(s): TSH, T4TOTAL, FREET4, T3FREE, THYROIDAB in the last 72 hours. Anemia Panel: No results for input(s): VITAMINB12, FOLATE, FERRITIN, TIBC, IRON, RETICCTPCT in the last 72 hours. Sepsis Labs: No results for input(s): PROCALCITON, LATICACIDVEN in the last 168 hours.  Recent Results (from the past 240 hour(s))  Resp Panel by RT-PCR (Flu A&B, Covid) Nasopharyngeal Swab     Status: None   Collection Time: 01/25/21  9:10 AM   Specimen: Nasopharyngeal Swab; Nasopharyngeal(NP) swabs in vial transport medium  Result Value Ref Range Status   SARS Coronavirus 2 by RT PCR NEGATIVE NEGATIVE Final    Comment: (NOTE) SARS-CoV-2 target nucleic acids are NOT DETECTED.  The SARS-CoV-2 RNA is generally detectable in upper respiratory specimens during the acute phase of infection. The lowest concentration of SARS-CoV-2 viral copies this assay can detect is 138 copies/mL. A negative result does not preclude SARS-Cov-2 infection and should not be used as the sole basis for treatment or other patient management  decisions. A negative result may occur with  improper specimen collection/handling, submission of specimen other than nasopharyngeal swab, presence of viral mutation(s) within the areas targeted by this assay, and inadequate number of viral copies(<138 copies/mL). A negative result must be combined with clinical observations, patient history, and epidemiological information. The expected result is Negative.  Fact Sheet for Patients:  BloggerCourse.com  Fact Sheet for Healthcare Providers:  SeriousBroker.it  This test is no t yet approved or cleared by the [place] FDA and  has been authorized for detection and/or diagnosis of SARS-CoV-2 by FDA under an Emergency Use Authorization (EUA). This EUA will remain  in effect (meaning this test can be used) for the duration of the COVID-19 declaration under Section 564(b)(1) of the Act, 21 U.S.C.section 360bbb-3(b)(1), unless the authorization is terminated  or revoked sooner.  Influenza A by PCR NEGATIVE NEGATIVE Final   Influenza B by PCR NEGATIVE NEGATIVE Final    Comment: (NOTE) The Xpert Xpress SARS-CoV-2/FLU/RSV plus assay is intended as an aid in the diagnosis of influenza from Nasopharyngeal swab specimens and should not be used as a sole basis for treatment. Nasal washings and aspirates are unacceptable for Xpert Xpress SARS-CoV-2/FLU/RSV testing.  Fact Sheet for Patients: BloggerCourse.com  Fact Sheet for Healthcare Providers: SeriousBroker.it  This test is not yet approved or cleared by the [place] FDA and has been authorized for detection and/or diagnosis of SARS-CoV-2 by FDA under an Emergency Use Authorization (EUA). This EUA will remain in effect (meaning this test can be used) for the duration of the COVID-19 declaration under Section 564(b)(1) of the Act, 21 U.S.C. section 360bbb-3(b)(1), unless the  authorization is terminated or revoked.  Performed at [facility], [address], [place], [place] [address]          Radiology Studies: CT Head Wo Contrast  Result Date: 01/25/2021 CLINICAL DATA:  Seizure, nontraumatic. EXAM: CT HEAD WITHOUT CONTRAST TECHNIQUE: Contiguous axial images were obtained from the base of the skull through the vertex without intravenous contrast. COMPARISON:  11/19/2018 FINDINGS: Brain: No evidence of acute infarction, hemorrhage, hydrocephalus, extra-axial collection or mass lesion/mass effect. Vascular: No hyperdense vessel or unexpected calcification. Skull: Normal. Negative for fracture or focal lesion. Sinuses/Orbits: No acute finding. Other: None. IMPRESSION: Negative head CT. Electronically Signed   By: Richarda Overlie M.D.   On: 01/25/2021 09:44        Scheduled Meds:  amLODipine  10 mg Oral Daily   folic acid  1 mg Oral Daily   levETIRAcetam  500 mg Oral BID   lisinopril  20 mg Oral Daily   LORazepam  0-4 mg Intravenous Q6H   Followed by   Melene Muller ON 01/27/2021] LORazepam  0-4 mg Intravenous Q12H   magnesium oxide  800 mg Oral BID   multivitamin with minerals  1 tablet Oral Daily   pantoprazole  40 mg Oral Daily   potassium chloride  40 mEq Oral BID   thiamine  100 mg Oral Daily   Or   thiamine  100 mg Intravenous Daily   Continuous Infusions:   LOS: 1 day    Time spent: 35 minutes    Dorcas Carrow, MD [facility] Pager [phone]

## 2021-01-26 NOTE — Plan of Care (Signed)
  Problem: Education: Goal: Knowledge of General Education information will improve Description Including pain rating scale, medication(s)/side effects and non-pharmacologic comfort measures Outcome: Progressing   Problem: Health Behavior/Discharge Planning: Goal: Ability to manage health-related needs will improve Outcome: Progressing   

## 2021-01-27 LAB — CBC WITH DIFFERENTIAL/PLATELET
Abs Immature Granulocytes: 0.03 10*3/uL (ref 0.00–0.07)
Basophils Absolute: 0 10*3/uL (ref 0.0–0.1)
Basophils Relative: 1 %
Eosinophils Absolute: 0.1 10*3/uL (ref 0.0–0.5)
Eosinophils Relative: 1 %
HCT: 38.1 % — ABNORMAL LOW (ref 39.0–52.0)
Hemoglobin: 12.5 g/dL — ABNORMAL LOW (ref 13.0–17.0)
Immature Granulocytes: 1 %
Lymphocytes Relative: 17 %
Lymphs Abs: 1 10*3/uL (ref 0.7–4.0)
MCH: 34.1 pg — ABNORMAL HIGH (ref 26.0–34.0)
MCHC: 32.8 g/dL (ref 30.0–36.0)
MCV: 103.8 fL — ABNORMAL HIGH (ref 80.0–100.0)
Monocytes Absolute: 0.8 10*3/uL (ref 0.1–1.0)
Monocytes Relative: 13 %
Neutro Abs: 4 10*3/uL (ref 1.7–7.7)
Neutrophils Relative %: 67 %
Platelets: 182 10*3/uL (ref 150–400)
RBC: 3.67 MIL/uL — ABNORMAL LOW (ref 4.22–5.81)
RDW: 12.5 % (ref 11.5–15.5)
WBC: 5.9 10*3/uL (ref 4.0–10.5)
nRBC: 0 % (ref 0.0–0.2)

## 2021-01-27 LAB — COMPREHENSIVE METABOLIC PANEL
ALT: 99 U/L — ABNORMAL HIGH (ref 0–44)
AST: 156 U/L — ABNORMAL HIGH (ref 15–41)
Albumin: 4.4 g/dL (ref 3.5–5.0)
Alkaline Phosphatase: 36 U/L — ABNORMAL LOW (ref 38–126)
Anion gap: 9 (ref 5–15)
BUN: 7 mg/dL (ref 6–20)
CO2: 23 mmol/L (ref 22–32)
Calcium: 9.7 mg/dL (ref 8.9–10.3)
Chloride: 106 mmol/L (ref 98–111)
Creatinine, Ser: 0.82 mg/dL (ref 0.61–1.24)
GFR, Estimated: >60 mL/min (ref >60)
Glucose, Bld: 93 mg/dL (ref 70–99)
Potassium: 3.9 mmol/L (ref 3.5–5.1)
Sodium: 138 mmol/L (ref 135–145)
Total Bilirubin: 1 mg/dL (ref 0.3–1.2)
Total Protein: 7.7 g/dL (ref 6.5–8.1)

## 2021-01-27 LAB — MAGNESIUM: Magnesium: 2 mg/dL (ref 1.7–2.4)

## 2021-01-27 LAB — PHOSPHORUS: Phosphorus: 3.4 mg/dL (ref 2.5–4.6)

## 2021-01-27 MED ORDER — LISINOPRIL 20 MG PO TABS: 20.0000 mg | ORAL_TABLET | Freq: Every day | ORAL | 0 refills | Status: DC

## 2021-01-27 MED ORDER — AMLODIPINE BESYLATE 10 MG PO TABS: 10.0000 mg | ORAL_TABLET | Freq: Every day | ORAL | 0 refills | Status: DC

## 2021-01-27 MED ORDER — LEVETIRACETAM 500 MG PO TABS
500.0000 mg | ORAL_TABLET | Freq: Two times a day (BID) | ORAL | 0 refills | Status: DC
Start: 2021-01-27 — End: 2021-10-08

## 2021-01-27 MED ORDER — MAGNESIUM OXIDE -MG SUPPLEMENT 400 (240 MG) MG PO TABS: 800.0000 mg | ORAL_TABLET | Freq: Two times a day (BID) | ORAL | 0 refills | Status: AC

## 2021-01-27 MED ORDER — HYDROXYZINE HCL 10 MG PO TABS: 10.0000 mg | ORAL_TABLET | Freq: Three times a day (TID) | ORAL | 0 refills | Status: AC | PRN

## 2021-01-27 MED ORDER — CHLORDIAZEPOXIDE HCL 10 MG PO CAPS: ORAL_CAPSULE | ORAL | 0 refills | Status: DC

## 2021-01-27 NOTE — Progress Notes (Signed)
RN reviewed discharge paperwork with patient who stated understanding.  IV removed. Patient in possession of all belongings.  NT transported patient to front entrance where wife was waiting in her vehicle.

## 2021-01-27 NOTE — Discharge Summary (Signed)
Physician Discharge Summary  Brett Molina Brett Molina DOB: 1991-04-06 DOA: 01/25/2021  PCP: Hoy Register, MD  Admit date: 01/25/2021 Discharge date: 01/27/2021  Admitted From: Home Disposition: Home  Recommendations for Outpatient Follow-up:  Follow up with PCP in 1-2 weeks Please obtain BMP/CBC/magnesium /phosphorus in one week Will make referral to neurology.  Take seizure precautions including abstinence from driving until cleared by neurology.  Home Health: Not applicable Equipment/Devices: Not applicable  Discharge Condition: Stable CODE STATUS: Full code Diet recommendation: Low-salt diet  Discharge summary: 30 year old gentleman with history of hypertension and alcohol abuse brought to the emergency room with altered mental status and seizure-like episode.  Patient was trying to detox himself at home so he did not drink alcohol for about 24 hours, wife found him confused with bloody foaming from mouth and not talking or shaking.  Brought to the emergency room by EMS.  Had 1 similar episode in 2020 while trying to detox.  No evidence of incontinence or trauma.  In the emergency room significantly tremulous with evidence of alcohol withdrawal.  CT head was done and that was without any acute findings.  Admitted with alcohol withdrawal and suspected withdrawal seizure.   Delirium tremens due to alcohol withdrawal/suspected alcohol withdrawal seizures: Patient did not have clear-cut evidence of seizures, however given high probability of seizure with alcohol withdrawal, neurology recommended loading with Keppra and discharging with oral Keppra.  Patient had no more events after admission.  Will discharge with 1 month of Keppra 500 mg twice daily and will send a referral to neurology for follow-up. Patient was treated for alcohol withdrawal with benzodiazepines and multivitamins.  Did fairly well.  His symptoms are well controlled today and using Ativan in the hospital.  With  well-controlled symptoms, he would like to go home.  His wife was at the bedside who is going to stay with him 24/7 for next few days. Will prescribe chlordiazepoxide 10 mg 3 times daily and taper in next 3 days.  Wife will keep the medication and give it to him.  We will also suggest to take hydroxyzine for anxiety. Patient and family were provided with outpatient alcohol rehab resources and the representative.  Patient is very motivated to quit drinking. Electrolytes were replaced and adequate before discharge.  Also sent home on oral replacement.  Essential hypertension: Patient does have history of high blood pressure and supposed to be on amlodipine and lisinopril.  He was currently not taking it.  Prescribed and advised to follow-up with primary care physician.  Patient has mostly improved.  He still has some tremors but no evidence of delirium.  Stable for discharge on tapering dose of benzodiazepines. Advised all-time seizure precautions, no driving until seen by neurology.    Discharge Diagnoses:  Active Problems:   Alcohol withdrawal [facility])    Discharge Instructions  Discharge Instructions     Ambulatory referral to Neurology   Complete by: As directed    An appointment is requested in approximately: 4 weeks Follow up for seizure treatment   Diet - low sodium heart healthy   Complete by: As directed    Discharge instructions   Complete by: As directed    All time seizure precautions. No driving until cleared by neurology   Increase activity slowly   Complete by: As directed       Allergies as of 01/27/2021   No Known Allergies      Medication List     STOP taking these medications  carvedilol 12.5 MG tablet Commonly known as: COREG   hydrALAZINE 50 MG tablet Commonly known as: APRESOLINE   naltrexone 50 MG tablet Commonly known as: DEPADE   pantoprazole 40 MG tablet Commonly known as: PROTONIX   thiamine 100 MG tablet       TAKE these  medications    amLODipine 10 MG tablet Commonly known as: NORVASC Take 1 tablet (10 mg total) by mouth daily.   chlordiazePOXIDE 10 MG capsule Commonly known as: LIBRIUM 1 tablet 3 times a day for 2 days 1 tablet 2 times a day for 2 days 1 tablet once a day for 2 days   hydrOXYzine 10 MG tablet Commonly known as: ATARAX/VISTARIL Take 1 tablet (10 mg total) by mouth 3 (three) times daily as needed for up to 7 days for anxiety.   levETIRAcetam 500 MG tablet Commonly known as: KEPPRA Take 1 tablet (500 mg total) by mouth 2 (two) times daily.   lisinopril 20 MG tablet Commonly known as: ZESTRIL Take 1 tablet (20 mg total) by mouth daily. What changed:  medication strength how much to take   magnesium oxide 400 (240 Mg) MG tablet Commonly known as: MAG-OX Take 2 tablets (800 mg total) by mouth 2 (two) times daily for 14 days.   multivitamin tablet Take 1 tablet by mouth daily.        Follow-up Information     Hoy RegisterNewlin, Enobong, MD Follow up in 1 week(s).   Specialty: Family Medicine Contact information: [address] [place] [address] [phone]                No Known Allergies  Consultations: Neurology, phone consultation by admitting physician as recorded.   Procedures/Studies: CT Head Wo Contrast  Result Date: 01/25/2021 CLINICAL DATA:  Seizure, nontraumatic. EXAM: CT HEAD WITHOUT CONTRAST TECHNIQUE: Contiguous axial images were obtained from the base of the skull through the vertex without intravenous contrast. COMPARISON:  11/19/2018 FINDINGS: Brain: No evidence of acute infarction, hemorrhage, hydrocephalus, extra-axial collection or mass lesion/mass effect. Vascular: No hyperdense vessel or unexpected calcification. Skull: Normal. Negative for fracture or focal lesion. Sinuses/Orbits: No acute finding. Other: None. IMPRESSION: Negative head CT. Electronically Signed   By: Richarda OverlieAdam  Henn M.D.   On: 01/25/2021 09:44   (Echo, Carotid, EGD,  Colonoscopy, ERCP)    Subjective: Patient seen and examined.  Wife at the bedside.  Patient himself denies any complaints.  Has some tremors but otherwise he feels fine.  Denies any nausea vomiting.  Denies any hallucinations or delusions.   Discharge Exam: Vitals:   01/27/21 0549 01/27/21 0600  BP: (!) 163/111 (!) 148/86  Pulse: 78   Resp: 16   Temp: 98.4 F (36.9 C)   SpO2: 100%    Vitals:   01/26/21 1219 01/26/21 2046 01/27/21 0549 01/27/21 0600  BP: (!) 142/94 (!) 139/99 (!) 163/111 (!) 148/86  Pulse: 97 82 78   Resp: 16 19 16    Temp: 98.4 F (36.9 C) 98.8 F (37.1 C) 98.4 F (36.9 C)   TempSrc: Oral Oral Oral   SpO2: 98% 99% 100%   Weight:      Height:        General: Pt is alert, awake, not in acute distress Cardiovascular: RRR, S1/S2 +, no rubs, no gallops Respiratory: CTA bilaterally, no wheezing, no rhonchi Abdominal: Soft, NT, ND, bowel sounds + Extremities: no edema, no cyanosis Patient has some shakiness otherwise no neurological findings.    The results of significant  diagnostics from this hospitalization (including imaging, microbiology, ancillary and laboratory) are listed below for reference.     Microbiology: Recent Results (from the past 240 hour(s))  Resp Panel by RT-PCR (Flu A&B, Covid) Nasopharyngeal Swab     Status: None   Collection Time: 01/25/21  9:10 AM   Specimen: Nasopharyngeal Swab; Nasopharyngeal(NP) swabs in vial transport medium  Result Value Ref Range Status   SARS Coronavirus 2 by RT PCR NEGATIVE NEGATIVE Final    Comment: (NOTE) SARS-CoV-2 target nucleic acids are NOT DETECTED.  The SARS-CoV-2 RNA is generally detectable in upper respiratory specimens during the acute phase of infection. The lowest concentration of SARS-CoV-2 viral copies this assay can detect is 138 copies/mL. A negative result does not preclude SARS-Cov-2 infection and should not be used as the sole basis for treatment or other patient management  decisions. A negative result may occur with  improper specimen collection/handling, submission of specimen other than nasopharyngeal swab, presence of viral mutation(s) within the areas targeted by this assay, and inadequate number of viral copies(<138 copies/mL). A negative result must be combined with clinical observations, patient history, and epidemiological information. The expected result is Negative.  Fact Sheet for Patients:  BloggerCourse.com  Fact Sheet for Healthcare Providers:  SeriousBroker.it  This test is no t yet approved or cleared by the [place] FDA and  has been authorized for detection and/or diagnosis of SARS-CoV-2 by FDA under an Emergency Use Authorization (EUA). This EUA will remain  in effect (meaning this test can be used) for the duration of the COVID-19 declaration under Section 564(b)(1) of the Act, 21 U.S.C.section 360bbb-3(b)(1), unless the authorization is terminated  or revoked sooner.       Influenza A by PCR NEGATIVE NEGATIVE Final   Influenza B by PCR NEGATIVE NEGATIVE Final    Comment: (NOTE) The Xpert Xpress SARS-CoV-2/FLU/RSV plus assay is intended as an aid in the diagnosis of influenza from Nasopharyngeal swab specimens and should not be used as a sole basis for treatment. Nasal washings and aspirates are unacceptable for Xpert Xpress SARS-CoV-2/FLU/RSV testing.  Fact Sheet for Patients: BloggerCourse.com  Fact Sheet for Healthcare Providers: SeriousBroker.it  This test is not yet approved or cleared by the [place] FDA and has been authorized for detection and/or diagnosis of SARS-CoV-2 by FDA under an Emergency Use Authorization (EUA). This EUA will remain in effect (meaning this test can be used) for the duration of the COVID-19 declaration under Section 564(b)(1) of the Act, 21 U.S.C. section 360bbb-3(b)(1), unless the  authorization is terminated or revoked.  Performed at [facility], [address] [place], [place] [address]      Labs: BNP (last 3 results) No results for input(s): BNP in the last 8760 hours. Basic Metabolic Panel: Recent Labs  Lab 01/25/21 0850 01/25/21 0859 01/26/21 0347 01/27/21 0333  NA  --  134* 137 138  K  --  3.8 3.3* 3.9  CL  --  96* 103 106  CO2  --  21* 24 23  GLUCOSE  --  126* 87 93  BUN  --  9 8 7   CREATININE  --  1.01 0.81 0.82  CALCIUM  --  9.9 8.7* 9.7  MG 2.2  --   --  2.0  PHOS 1.6*  --   --  3.4   Liver Function Tests: Recent Labs  Lab 01/25/21 0859 01/26/21 0347 01/27/21 0333  AST 106* 98* 156*  ALT 107* 84* 99*  ALKPHOS 55  44 36*  BILITOT 1.3* 1.4* 1.0  PROT 8.9* 7.2 7.7  ALBUMIN 5.1* 4.2 4.4   Recent Labs  Lab 01/25/21 0859  LIPASE 35   No results for input(s): AMMONIA in the last 168 hours. CBC: Recent Labs  Lab 01/25/21 0850 01/26/21 0347 01/27/21 0333  WBC 6.7 5.5 5.9  NEUTROABS 5.1  --  4.0  HGB 14.4 11.9* 12.5*  HCT 42.8 36.8* 38.1*  MCV 102.4* 104.2* 103.8*  PLT 222 174 182   Cardiac Enzymes: No results for input(s): CKTOTAL, CKMB, CKMBINDEX, TROPONINI in the last 168 hours. BNP: Invalid input(s): POCBNP CBG: No results for input(s): GLUCAP in the last 168 hours. D-Dimer No results for input(s): DDIMER in the last 72 hours. Hgb A1c No results for input(s): HGBA1C in the last 72 hours. Lipid Profile No results for input(s): CHOL, HDL, LDLCALC, TRIG, CHOLHDL, LDLDIRECT in the last 72 hours. Thyroid function studies No results for input(s): TSH, T4TOTAL, T3FREE, THYROIDAB in the last 72 hours.  Invalid input(s): FREET3 Anemia work up No results for input(s): VITAMINB12, FOLATE, FERRITIN, TIBC, IRON, RETICCTPCT in the last 72 hours. Urinalysis    Component Value Date/Time   COLORURINE YELLOW 01/25/2021 1230   APPEARANCEUR CLEAR (A) 01/25/2021 1230   LABSPEC 1.025 01/25/2021 1230    PHURINE 6.0 01/25/2021 1230   GLUCOSEU NEGATIVE 01/25/2021 1230   HGBUR MODERATE (A) 01/25/2021 1230   BILIRUBINUR SMEAR ONLY (A) 01/25/2021 1230   KETONESUR >80 (A) 01/25/2021 1230   PROTEINUR >300 (A) 01/25/2021 1230   NITRITE NEGATIVE 01/25/2021 1230   LEUKOCYTESUR NEGATIVE 01/25/2021 1230   Sepsis Labs Invalid input(s): PROCALCITONIN,  WBC,  LACTICIDVEN Microbiology Recent Results (from the past 240 hour(s))  Resp Panel by RT-PCR (Flu A&B, Covid) Nasopharyngeal Swab     Status: None   Collection Time: 01/25/21  9:10 AM   Specimen: Nasopharyngeal Swab; Nasopharyngeal(NP) swabs in vial transport medium  Result Value Ref Range Status   SARS Coronavirus 2 by RT PCR NEGATIVE NEGATIVE Final    Comment: (NOTE) SARS-CoV-2 target nucleic acids are NOT DETECTED.  The SARS-CoV-2 RNA is generally detectable in upper respiratory specimens during the acute phase of infection. The lowest concentration of SARS-CoV-2 viral copies this assay can detect is 138 copies/mL. A negative result does not preclude SARS-Cov-2 infection and should not be used as the sole basis for treatment or other patient management decisions. A negative result may occur with  improper specimen collection/handling, submission of specimen other than nasopharyngeal swab, presence of viral mutation(s) within the areas targeted by this assay, and inadequate number of viral copies(<138 copies/mL). A negative result must be combined with clinical observations, patient history, and epidemiological information. The expected result is Negative.  Fact Sheet for Patients:  BloggerCourse.com  Fact Sheet for Healthcare Providers:  SeriousBroker.it  This test is no t yet approved or cleared by the [place] FDA and  has been authorized for detection and/or diagnosis of SARS-CoV-2 by FDA under an Emergency Use Authorization (EUA). This EUA will remain  in effect (meaning this  test can be used) for the duration of the COVID-19 declaration under Section 564(b)(1) of the Act, 21 U.S.C.section 360bbb-3(b)(1), unless the authorization is terminated  or revoked sooner.       Influenza A by PCR NEGATIVE NEGATIVE Final   Influenza B by PCR NEGATIVE NEGATIVE Final    Comment: (NOTE) The Xpert Xpress SARS-CoV-2/FLU/RSV plus assay is intended as an aid in the diagnosis of influenza from Nasopharyngeal swab specimens  and should not be used as a sole basis for treatment. Nasal washings and aspirates are unacceptable for Xpert Xpress SARS-CoV-2/FLU/RSV testing.  Fact Sheet for Patients: BloggerCourse.com  Fact Sheet for Healthcare Providers: SeriousBroker.it  This test is not yet approved or cleared by the [place] FDA and has been authorized for detection and/or diagnosis of SARS-CoV-2 by FDA under an Emergency Use Authorization (EUA). This EUA will remain in effect (meaning this test can be used) for the duration of the COVID-19 declaration under Section 564(b)(1) of the Act, 21 U.S.C. section 360bbb-3(b)(1), unless the authorization is terminated or revoked.  Performed at [facility], [address] [place], [place] [address]      Time coordinating discharge:  35 minutes  SIGNED:   Dorcas Carrow, MD  [facility] 01/27/2021, 2:28 PM

## 2021-01-28 ENCOUNTER — Telehealth: Payer: Self-pay

## 2021-01-28 NOTE — Telephone Encounter (Signed)
Transition Care Management Follow-up Telephone   Date of discharge and from where: [facility] on 02/05/2021 How have you been since you were released from the hospital? Doing great  Any questions or concerns? No questions/concerns reported.  Items Reviewed: Did the pt receive and understand the discharge instructions provided? have the instructions and have no questions at this time  Medications obtained and verified? He said have the medication list,  and he has all of the medications / no questions.  Any new allergies since your discharge? None reported  Do you have support at home? Yes, family  Other (ie: DME, Home Health, etc)        Functional Questionnaire: (I = Independent and D = Dependent) ADL's:  Independent.        Follow up appointments reviewed:   PCP Hospital f/u appt confirmed? Dr Alvis Lemmings on 02/11/2021@ 1050.  Specialist Hospital f/u appt confirmed?  None scheduled at this time  Are transportation arrangements needed? have transportation   If their condition worsens, is the pt aware to call  their PCP or go to the ED? Yes.Made pt aware if condition worsen or start experiencing rapid weight gain, chest pain, diff breathing, SOB, high fevers, or bleading to refer imediately to ED for further evaluation.  Was the patient provided with contact information for the PCP's office or ED? He has the phone number  Was the pt encouraged to call back with questions or concerns?yes

## 2021-02-11 ENCOUNTER — Ambulatory Visit: Payer: Self-pay | Attending: Family Medicine | Admitting: Family Medicine

## 2021-02-11 ENCOUNTER — Other Ambulatory Visit: Payer: Self-pay

## 2021-02-12 ENCOUNTER — Ambulatory Visit: Payer: Self-pay | Admitting: Neurology

## 2021-02-13 ENCOUNTER — Encounter: Payer: Self-pay | Admitting: Neurology

## 2021-06-19 ENCOUNTER — Emergency Department (HOSPITAL_COMMUNITY)
Admission: EM | Admit: 2021-06-19 | Discharge: 2021-06-19 | Disposition: A | Discharge status: Home / Self Care | Payer: Self-pay | Attending: Emergency Medicine | Admitting: Emergency Medicine

## 2021-06-19 ENCOUNTER — Other Ambulatory Visit: Payer: Self-pay

## 2021-06-19 ENCOUNTER — Encounter (HOSPITAL_COMMUNITY): Payer: Self-pay | Admitting: Emergency Medicine

## 2021-06-19 DIAGNOSIS — I10 Essential (primary) hypertension: Secondary | ICD-10-CM | POA: Insufficient documentation

## 2021-06-19 DIAGNOSIS — F1010 Alcohol abuse, uncomplicated: Secondary | ICD-10-CM | POA: Insufficient documentation

## 2021-06-19 DIAGNOSIS — Y908 Blood alcohol level of 240 mg/100 ml or more: Secondary | ICD-10-CM | POA: Insufficient documentation

## 2021-06-19 DIAGNOSIS — Z79899 Other long term (current) drug therapy: Secondary | ICD-10-CM | POA: Insufficient documentation

## 2021-06-19 LAB — COMPREHENSIVE METABOLIC PANEL
ALT: 28 U/L (ref 0–44)
AST: 46 U/L — ABNORMAL HIGH (ref 15–41)
Albumin: 4.7 g/dL (ref 3.5–5.0)
Alkaline Phosphatase: 73 U/L (ref 38–126)
Anion gap: 13 (ref 5–15)
BUN: 5 mg/dL — ABNORMAL LOW (ref 6–20)
CO2: 28 mmol/L (ref 22–32)
Calcium: 9.1 mg/dL (ref 8.9–10.3)
Chloride: 99 mmol/L (ref 98–111)
Creatinine, Ser: 0.89 mg/dL (ref 0.61–1.24)
GFR, Estimated: >60 mL/min (ref >60)
Glucose, Bld: 97 mg/dL (ref 70–99)
Potassium: 4.5 mmol/L (ref 3.5–5.1)
Sodium: 140 mmol/L (ref 135–145)
Total Bilirubin: 0.8 mg/dL (ref 0.3–1.2)
Total Protein: 8.6 g/dL — ABNORMAL HIGH (ref 6.5–8.1)

## 2021-06-19 LAB — ETHANOL: Alcohol, Ethyl (B): 313 mg/dL (ref <10)

## 2021-06-19 LAB — CBC WITH DIFFERENTIAL/PLATELET
Abs Immature Granulocytes: 0.01 10*3/uL (ref 0.00–0.07)
Basophils Absolute: 0 10*3/uL (ref 0.0–0.1)
Basophils Relative: 0 %
Eosinophils Absolute: 0 10*3/uL (ref 0.0–0.5)
Eosinophils Relative: 0 %
HCT: 42.5 % (ref 39.0–52.0)
Hemoglobin: 14.2 g/dL (ref 13.0–17.0)
Immature Granulocytes: 0 %
Lymphocytes Relative: 30 %
Lymphs Abs: 1.8 10*3/uL (ref 0.7–4.0)
MCH: 30.5 pg (ref 26.0–34.0)
MCHC: 33.4 g/dL (ref 30.0–36.0)
MCV: 91.4 fL (ref 80.0–100.0)
Monocytes Absolute: 0.3 10*3/uL (ref 0.1–1.0)
Monocytes Relative: 6 %
Neutro Abs: 3.7 10*3/uL (ref 1.7–7.7)
Neutrophils Relative %: 64 %
Platelets: 287 10*3/uL (ref 150–400)
RBC: 4.65 MIL/uL (ref 4.22–5.81)
RDW: 13.2 % (ref 11.5–15.5)
WBC: 5.8 10*3/uL (ref 4.0–10.5)
nRBC: 0 % (ref 0.0–0.2)

## 2021-06-19 MED ORDER — LORAZEPAM 1 MG PO TABS
1.0000 mg | ORAL_TABLET | Freq: Once | ORAL | Status: AC
  Administered 2021-06-19: 1 mg via ORAL
  Filled 2021-06-19: qty 1

## 2021-06-19 MED ORDER — SODIUM CHLORIDE 0.9 % IV BOLUS
500.0000 mL | Freq: Once | INTRAVENOUS | Status: AC
  Administered 2021-06-19: 500 mL via INTRAVENOUS

## 2021-06-19 MED ORDER — CHLORDIAZEPOXIDE HCL 25 MG PO CAPS: ORAL_CAPSULE | ORAL | 0 refills | Status: DC

## 2021-06-19 NOTE — ED Notes (Signed)
Pt verbalized understanding of discharge instructions and follow up. Pts wife is taking pt home.   Pt a/ox4 Ambulatory in triage.

## 2021-06-19 NOTE — ED Provider Notes (Signed)
[facility]-EMERGENCY DEPT Provider Note   CSN: [id number] Arrival date & time: 06/19/21  5573     History Chief Complaint  Patient presents with   Alcohol Problem    Brett Molina is a 30 y.o. male.  30 year old male with prior medical history as detailed below presents for evaluation.  Patient reports that he has a longstanding history of alcohol use and abuse.  Patient reports that over the last 1 to 2 weeks he started drinking again.  He reports significant alcohol intake on a daily basis.  His last reported drink was yesterday.  He denies any current complaint.  He is reporting that he would prefer to be sober.  He does have a history of seizures related to withdrawal.  The history is provided by the patient.  Alcohol Problem This is a chronic problem. The current episode started yesterday. The problem occurs constantly. The problem has not changed since onset.Nothing aggravates the symptoms. Nothing relieves the symptoms.      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/12/2016   MDD (major depressive disorder) 12/12/2016    Past Surgical History:  Procedure Laterality Date   ESOPHAGOGASTRODUODENOSCOPY N/A 07/24/2018   Procedure: ESOPHAGOGASTRODUODENOSCOPY (EGD);  Surgeon: Rachael Fee, MD;  Location: [facility];  Service: Endoscopy;  Laterality: N/A;       Family History  Problem Relation Age of Onset   Hypertension Mother     Hypertension Father     Social History   Tobacco Use   Smoking status: Never   Smokeless tobacco: Never  Vaping Use   Vaping Use: Never used  Substance Use Topics   Alcohol use: Yes    Alcohol/week: 14.0 standard drinks    Types: 14 Standard drinks or equivalent per week    Comment: 1/2 gallon of wine PTA   Drug use: No    Home Medications Prior to Admission medications   Medication Sig Start Date End Date Taking? Authorizing Provider  amLODipine (NORVASC) 10 MG tablet Take 1 tablet (10 mg total) by mouth daily. Patient not taking: Reported on 06/19/2021 01/27/21 02/26/21  Dorcas Carrow, MD  chlordiazePOXIDE (LIBRIUM) 10 MG capsule 1 tablet 3 times a day for 2 days 1 tablet 2 times a day for 2 days 1 tablet once a day for 2 days Patient not taking: Reported on 06/19/2021 01/27/21   Dorcas Carrow, MD  levETIRAcetam (KEPPRA) 500 MG tablet Take 1 tablet (500 mg total) by mouth 2 (two) times daily. Patient not taking: Reported on 06/19/2021 01/27/21 02/26/21  Dorcas Carrow, MD  lisinopril (ZESTRIL) 20 MG tablet Take 1 tablet (20 mg total) by mouth daily. Patient not taking: Reported on 06/19/2021 01/27/21 02/26/21  Dorcas Carrow, MD    Allergies    Patient has no known allergies.  Review of Systems   Review of Systems  All other systems reviewed and are negative.  Physical Exam Updated Vital Signs BP (!) 149/78  Pulse 84    Temp 98.7 F (37.1 C) (Oral)    Resp 14    Ht 6\' 2"  (1.88 m)    Wt 104.3 kg    SpO2 100%    BMI 29.53 kg/m   Physical Exam Vitals and nursing note reviewed.  Constitutional:      General: He is not in acute distress.    Appearance: Normal appearance. He is well-developed.     Comments: Appears mildly intoxicated.  HENT:     Head: Normocephalic and atraumatic.  Eyes:     Conjunctiva/sclera: Conjunctivae normal.     Pupils: Pupils are equal, round, and reactive to light.  Cardiovascular:     Rate and Rhythm: Normal rate and regular rhythm.      Heart sounds: Normal heart sounds.  Pulmonary:     Effort: Pulmonary effort is normal. No respiratory distress.     Breath sounds: Normal breath sounds.  Abdominal:     General: There is no distension.     Palpations: Abdomen is soft.     Tenderness: There is no abdominal tenderness.  Musculoskeletal:        General: No deformity. Normal range of motion.     Cervical back: Normal range of motion and neck supple.  Skin:    General: Skin is warm and dry.  Neurological:     General: No focal deficit present.     Mental Status: He is alert and oriented to person, place, and time.    ED Results / Procedures / Treatments   Labs (all labs ordered are listed, but only abnormal results are displayed) Labs Reviewed  ETHANOL - Abnormal; Notable for the following components:      Result Value   Alcohol, Ethyl (B) 313 (*)    All other components within normal limits  COMPREHENSIVE METABOLIC PANEL - Abnormal; Notable for the following components:   BUN 5 (*)    Total Protein 8.6 (*)    AST 46 (*)    All other components within normal limits  CBC WITH DIFFERENTIAL/PLATELET    EKG None  Radiology No results found.  Procedures Procedures   Medications Ordered in ED Medications  sodium chloride 0.9 % bolus 500 mL (500 mLs Intravenous New Bag/Given 06/19/21 1056)    ED Course  I have reviewed the triage vital signs and the nursing notes.  Pertinent labs & imaging results that were available during my care of the patient were reviewed by me and considered in my medical decision making (see chart for details).    MDM Rules/Calculators/A&P                           MDM  MSE complete  Brett Molina was evaluated in Emergency Department on 06/19/2021 for the symptoms described in the history of present illness. He was evaluated in the context of the global COVID-19 pandemic, which necessitated consideration that the patient might be at risk for infection with the  SARS-CoV-2 virus that causes COVID-19. Institutional protocols and algorithms that pertain to the evaluation of patients at risk for COVID-19 are in a state of rapid change based on information released by regulatory bodies including the CDC and federal and state organizations. These policies and algorithms were followed during the patient's care in the ED.   Patient presented with complaint of a recent binge of alcohol use.  He initially is requesting options for ETOH detox.  Patient is clinically  sober -- with an elevated alcohol level.  TTS evaluated the patient.  He declines inpatient options.  He prefers that he will "attempt to become sober" at home.  Patient with history of withdrawal seizure in past. He and his wife are advised that he is high risk for significant withdrawal.  He still desires DC.  Importance of close follow-up is stressed.  Strict return precautions given and understood.  Patient will be prescribed Librium taper if he chooses to utilize it.  Final Clinical Impression(s) / ED Diagnoses Final diagnoses:  Alcohol abuse    Rx / DC Orders ED Discharge Orders     None        Wynetta Fines, MD 06/19/21 1431

## 2021-06-19 NOTE — ED Notes (Signed)
TTS machine at bedside-attempting to connect with counselor

## 2021-06-19 NOTE — Discharge Instructions (Addendum)
If you decide that you would like to become sober you may use the prescribed Librium taper to help you achieve this.  It is imperative to not take Librium and drink alcohol at the same time.  It is also very important not to drink and operate a vehicle or other heavy machinery.  This could result in your death or someone else's.    To help you maintain a sober lifestyle, a substance use disorder treatment program may be beneficial to you.  Contact one of the following providers at your earliest opportunity to ask about enrolling their program:       [facility]      [address] [place], [place] [address]      [phone]       [facility]      [address]      [place], [place] [address]      [phone]       [facility]      [address]      [place], [place] [address]      [phone]       [facility]      [address]      [place], [place] [address]      [phone]  OUTPATIENT PROGRAMS:       [facility]      [address]      [place], [place] [address]      [phone]       They offer counseling for individuals with substance use disorders.  New patients are seen at their walk-in clinic.  Walk-in hours are Monday - Friday from 8:30 am - 12:00 pm, and from 1:00 pm - 2:30 pm.  Walk-in patients are seen on a first come, first served basis, so try to arrive as early as possible for the best chance of being seen the same day.

## 2021-06-19 NOTE — BH Assessment (Addendum)
Comprehensive Clinical Assessment (CCA) Note  06/19/2021 Brett Molina [id number]  Disposition: TTS completed. Per Sheran Fava. DNP, patient is psych cleared.  Patient says that he is asking for medical clearance today only.  He is not interested in starting any substance use programs until after the holidays. States, that he has other obligations at this time. Upon further discussion, he is willing to at least consider CDIOP/SAIOP. Disposition Counselor requested to please note CDIOP/SAIOP resources in patient's AVS, in addition to detox/residential treatment programs/AA groups/etc.  Flowsheet Row ED from 06/19/2021 in [place] DEPT ED to Hosp-Admission (Discharged) from 01/25/2021 in [place] No Risk No Risk      The patient demonstrates the following risk factors for suicide: Chronic risk factors for suicide include: psychiatric disorder of Depressive Disorder, Mild and Alcohol Use Disorder and substance use disorder. Acute risk factors for suicide include:  anniversary of patient's mothers death was last week . Protective factors for this patient include: hope for the future. Considering these factors, the overall suicide risk at this point appears to be "No Risk". Patient is appropriate for outpatient follow up.   Chief Complaint:  Chief Complaint  Patient presents with   Alcohol Problem   Visit Diagnosis: Depressive Disorder, Mild and Alcohol Use Disorder    ED note from Dene Gentry: "Brett Molina is a 30 y.o. male with prior medical history as detailed below presents for evaluation.  Patient reports that he has a longstanding history of alcohol use and abuse.  Patient reports that over the last 1 to 2 weeks he started drinking again.  He reports significant alcohol intake daily.  His last reported drink was yesterday.  He denies any current complaint.  He is reporting  that he would prefer to be sober.  He does have a history of seizures related to withdrawal."  TTS note: Brett Molina is a 30 y.o. male that presents to [facility]. States that last week was the anniversary of his mother's death, which led him down a path of drinking. States that he came to [facility] today to make sure that he didn't harm himself as he is going through withdrawal symptoms. He only reports tha this withdrawal symptom is anxiety.   Patient started drinking at the age of 30 yrs old. He reports a hx of heavy use, however; over the past 4 months he has remained sober until a week ago. States that for the past week he has been drinking daily. Average amount of use per day is a 1 bottle of wine per day. His last drink was yesterday at 9pm;  bottle of wine. Denies drug use.   He has a hx of seizures (2 seizures). The first seizure was 5-6 yrs ago. The last seizure was July 2022. No hx of DT's. No hx of black outs.  Withdrawal symptoms in the past: shakes, tremors, nausea/vomiting, hot/cold flashes, and sweats. Negative consequences of use: Legal (DUI in 0000000), Family conflict, and, affected his ability to complete his job. He has a hx of participating in [place] an he participated in the program x5 months. He has a family hx of substance use, My grandparents on both sides struggled with alcohol.  Patient denies hx of suicidal thoughts/No current suicidal ideations. No hx of self-injurious behaviors. Current depressive symptoms: fatigue, angry/irritable, and guilt. He sleeps 7-8 hrs per night. Appetite comes and  goes, he reports eating 1-2 times per day. No significant weight loss and/or gain. No hx of anxiety outside of withdrawal. No family hx of mental health illnesses.   Denies homicidal ideations. Denies hx of aggressive and/or assaultive behaviors. No current legal issues. Denies AVH's. however, he has experienced  hallucinations related to withdrawal 1-2 yrs ago.   Patient is currently married. He lives in a household with his spouse. No children. Highest level of education is college. No hx of trauma and/or abuse. Currently employed as an Airline pilot.  Supports system is his spouse.    CCA Screening, Triage and Referral (STR)  Patient Reported Information How did you hear about [place]? No data recorded What Is the Reason for Your Visit/Call Today? Brett Molina is a 30 y.o. male with prior medical history as detailed below presents for evaluation.  Patient reports that he has a longstanding history of alcohol use and abuse.  Patient reports that over the last 1 to 2 weeks he started drinking again.  He reports significant alcohol intake daily.  His last reported drink was yesterday.  He denies any current complaint.  He is reporting that he would prefer to be sober.  He does have a history of seizures related to withdrawal.  How Long Has This Been Causing You Problems? > than 6 months  What Do You Feel Would Help You the Most Today? Treatment for Depression or other mood problem; Medication(s)   Have You Recently Had Any Thoughts About Hurting Yourself? No  Are You Planning to Commit Suicide/Harm Yourself At This time? No   Have you Recently Had Thoughts About Hurting Someone Karolee Ohs? No  Are You Planning to Harm Someone at This Time? No  Explanation: No data recorded  Have You Used Any Alcohol or Drugs in the Past 24 Hours? Yes  How Long Ago Did You Use Drugs or Alcohol? No data recorded What Did You Use and How Much? He reports a hx of heavy use, however; over the past 4 months he has remained sober until a week ago. States that for the past week he has been drinking daily. Average amount of use per day is a 1 bottle of wine per day. His last drink was yesterday at 9pm;  bottle of wine. Denies drug use.   Do You Currently Have a Therapist/Psychiatrist? No  Name of Therapist/Psychiatrist:  No data recorded  Have You Been Recently Discharged From Any Office Practice or Programs? No  Explanation of Discharge From Practice/Program: No data recorded    CCA Screening Triage Referral Assessment Type of Contact: Tele-Assessment  Telemedicine Service Delivery:   Is this Initial or Reassessment? Initial Assessment  Date Telepsych consult ordered in [facility]:  06/19/21  Time Telepsych consult ordered in [facility]:  No data recorded Location of Assessment: [facility] ED  Provider Location: Other (comment) ([facility])   Collateral Involvement: No data recorded  Does Patient Have a Court Appointed Legal Guardian? No data recorded Name and Contact of Legal Guardian: No data recorded If Minor and Not Living with Parent(s), Who has Custody? No data recorded Is CPS involved or ever been involved? Never  Is APS involved or ever been involved? Never   Patient Determined To Be At Risk for Harm To Self or Others Based on Review of Patient Reported Information or Presenting Complaint? No  Method: No data recorded Availability of Means: No data recorded Intent: No data recorded Notification Required: No data recorded Additional Information for Danger to Others Potential: No  data recorded Additional Comments for Danger to Others Potential: No data recorded Are There Guns or Other Weapons in [place]? No data recorded Types of Guns/Weapons: No data recorded Are These Weapons Safely Secured?                            No data recorded Who Could Verify You Are Able To Have These Secured: No data recorded Do You Have any Outstanding Charges, Pending Court Dates, Parole/Probation? No data recorded Contacted To Inform of Risk of Harm To Self or Others: No data recorded   Does Patient Present under Involuntary Commitment? No  IVC Papers Initial File Date: No data recorded  [place] of Residence: [place]   Patient Currently Receiving the Following Services: -- (No psych services)   Determination of  Need: Emergent (2 hours)   Options For Referral: Medication Management; Chemical Dependency Intensive Outpatient Therapy (CDIOP); Outpatient Therapy; Other: Comment (SAIOP)     CCA Biopsychosocial Patient Reported Schizophrenia/Schizoaffective Diagnosis in Past: No   Strengths: No data recorded  Mental Health Symptoms Depression:   Difficulty Concentrating; Irritability; Increase/decrease in appetite; Worthlessness; Fatigue; Hopelessness   Duration of Depressive symptoms:  Duration of Depressive Symptoms: Greater than two weeks   Mania:   N/A   Anxiety:    Difficulty concentrating; Fatigue; Irritability; Tension   Psychosis:   None   Duration of Psychotic symptoms:    Trauma:   N/A   Obsessions:   N/A   Compulsions:   Intrusive/time consuming   Inattention:   N/A   Hyperactivity/Impulsivity:   N/A   Oppositional/Defiant Behaviors:   None   Emotional Irregularity:   None   Other Mood/Personality Symptoms:  No data recorded   Mental Status Exam Appearance and self-care  Stature:   Average   Weight:   Average weight   Clothing:   Neat/clean   Grooming:   Normal   Cosmetic use:   None   Posture/gait:   Normal   Motor activity:   Not Remarkable   Sensorium  Attention:   Normal   Concentration:   Normal   Orientation:   Place; Person; Object; Situation; Time   Recall/memory:   Normal   Affect and Mood  Affect:   Appropriate   Mood:   Depressed   Relating  Eye contact:   Normal   Facial expression:   Depressed   Attitude toward examiner:   Cooperative   Thought and Language  Speech flow:  Clear and Coherent   Thought content:   Appropriate to Mood and Circumstances   Preoccupation:   None   Hallucinations:   None   Organization:  No data recorded  [employer] of Knowledge:   Average   Intelligence:   Average   Abstraction:   Normal   Judgement:   Fair   Art therapist:    Adequate   Insight:   Fair   Decision Making:   Normal   Social Functioning  Social Maturity:   Responsible   Social Judgement:   Normal   Stress  Stressors:   Grief/losses   Coping Ability:   Normal   Skill Deficits:   Self-control   Supports:   Family     Religion: Religion/Spirituality Are You A Religious Person?: No  Leisure/Recreation: Leisure / Recreation Do You Have Hobbies?:  (unknown)  Exercise/Diet: Exercise/Diet Do You Exercise?: No Have You Gained or Lost A Significant Amount of Weight  in the Past Six Months?: No Do You Follow a Special Diet?: No Do You Have Any Trouble Sleeping?: No Explanation of Sleeping Difficulties: He sleeps 7-8 hrs per night.   CCA Employment/Education Employment/Work Situation: Employment / Work Situation Employment Situation: Employed Work Stressors: Works as an Optometrist. Patient's Job has Been Impacted by Current Illness: Yes Describe how Patient's Job has Been Impacted: Struggles with concentrating Has Patient ever Been in the [employer]?: No  Education: Education Is Patient Currently Attending School?: No Last Grade Completed:  (college) Did Physicist, medical?: No Did You Have An Individualized Education Program (IIEP): No Did You Have Any Difficulty At School?: No Patient's Education Has Been Impacted by Current Illness: No   CCA Family/Childhood History Family and Relationship History: Family history Marital status: Married Does patient have children?: No  Childhood History:  Childhood History By whom was/is the patient raised?: Both parents Did patient suffer any verbal/emotional/physical/sexual abuse as a child?: No Did patient suffer from severe childhood neglect?: No Has patient ever been sexually abused/assaulted/raped as an adolescent or adult?: No Was the patient ever a victim of a crime or a disaster?: No Witnessed domestic violence?: No Has patient been affected by domestic violence  as an adult?: No  Child/Adolescent Assessment:     CCA Substance Use Alcohol/Drug Use: Alcohol / Drug Use Pain Medications: see MAR Prescriptions: see MAR History of alcohol / drug use?: Yes Longest period of sobriety (when/how long): n/a Negative Consequences of Use: Work / Youth worker, Scientist, research (physical sciences) Withdrawal Symptoms: Tremors, Irritability, Fever / Chills, Sweats, Seizures Onset of Seizures: 5-6 yrs ago Date of most recent seizure: July 2022 Substance #1 Name of Substance 1: Patient started drinking at the age of 30 yrs old. He reports a hx of heavy use, however; over the past 4 months he has remained sober until a week ago. States that for the past week he has been drinking daily. Average amount of use per day is a 1 bottle of wine per day. His last drink was yesterday at 9pm;  bottle of wine. Denies drug use. 1 - Age of First Use: 30 yrs old 1 - Amount (size/oz): 1 bottle of wine 1 - Frequency: daily for the past week 1 - Duration: Daily use since the age of 30 y/o, 4 months of sobriety, and relapsed last week (used daily for a week). 1 - Last Use / Amount: 06/18/21 @9pm  (1/2 bottle) 1 - Method of Aquiring: store 1- Route of Use: oral                       ASAM's:  Six Dimensions of Multidimensional Assessment  Dimension 1:  Acute Intoxication and/or Withdrawal Potential:      Dimension 2:  Biomedical Conditions and Complications:      Dimension 3:  Emotional, Behavioral, or Cognitive Conditions and Complications:     Dimension 4:  Readiness to Change:     Dimension 5:  Relapse, Continued use, or Continued Problem Potential:     Dimension 6:  Recovery/Living Environment:     ASAM Severity Score:    ASAM Recommended Level of Treatment:     Substance use Disorder (SUD)    Recommendations for Services/Supports/Treatments: Recommendations for Services/Supports/Treatments Recommendations For Services/Supports/Treatments: Medication Management, Detox, Individual Therapy,  CD-IOP Intensive Chemical Dependency Program, Peer Support, SAIOP (Substance Abuse Intensive Outpatient Program)  Discharge Disposition:    DSM5 Diagnoses: Patient Active Problem List   Diagnosis Date Noted   Alcohol use disorder,  severe, dependence ([place])    Ataxia 11/20/2018   HTN (hypertension) 11/20/2018   UGIB (upper gastrointestinal bleed) 08/13/2018   Intractable vomiting with nausea    Gastroesophageal reflux disease with esophagitis    GI bleed 07/23/2018   Upper GI bleeding 07/23/2018   Alcohol abuse 02/18/2017   Alcohol withdrawal ([place]) 02/16/2017   Hypokalemia [id number]   Alcoholic hepatitis without ascites 12/12/2016   MDD (major depressive disorder) 12/12/2016     Referrals to Alternative Service(s): Referred to Alternative Service(s):   Place:   Date:   Time:    Referred to Alternative Service(s):   Place:   Date:   Time:    Referred to Alternative Service(s):   Place:   Date:   Time:    Referred to Alternative Service(s):   Place:   Date:   Time:     Waldon Merl, Counselor

## 2021-06-19 NOTE — BH Assessment (Addendum)
TTS completed. Discussed clinical details with Caryn Bee. DNP, and patient is psych cleared. Patient says that he is asking for medical clearance today only.   He is not interested in starting any substance use programs until after the holidays. States, that he has other obligations at this time. Upon further discussion, he is willing to at least consider CDIOP/SAIOP.   Disposition Counselor (Tom), to note CDIOP/SAIOP resources in patient's AVS, in addition to detox/residential treatment programs/AA groups/etc.

## 2021-06-19 NOTE — ED Triage Notes (Addendum)
Last night was last drink, has 1 bottle of wine, had been sober for the past 3 months, has been drinking again for the past 1-2 weeks. Pt requesting help w/ alcohol detox. Reports a little anxiety and restlessness. Has not been to rehab before, family requesting a facility that is free. Hx of alcohol withdraw seizures.

## 2021-06-19 NOTE — BH Assessment (Signed)
[facility] Assessment Progress Note   Per Caryn Bee, NP , this voluntary pt does not require psychiatric hospitalization at this time.  Pt is psychiatrically cleared.  Discharge instructions include referral information for area substance use disorder treatment programs.  EDP Kristine Royal, MD and pt's nurse, Lillia Abed, have been notified.  Doylene Canning, MA Triage Specialist [phone]

## 2021-10-02 ENCOUNTER — Inpatient Hospital Stay (HOSPITAL_COMMUNITY)
Admission: EM | Admit: 2021-10-02 | Discharge: 2021-10-08 | DRG: 897 | Disposition: A | Discharge status: Home / Self Care | Payer: Self-pay | Attending: Internal Medicine | Admitting: Internal Medicine

## 2021-10-02 ENCOUNTER — Other Ambulatory Visit: Payer: Self-pay

## 2021-10-02 ENCOUNTER — Inpatient Hospital Stay (HOSPITAL_COMMUNITY): Payer: Self-pay

## 2021-10-02 ENCOUNTER — Encounter (HOSPITAL_COMMUNITY): Payer: Self-pay | Admitting: Oncology

## 2021-10-02 ENCOUNTER — Emergency Department (HOSPITAL_COMMUNITY): Payer: Self-pay

## 2021-10-02 DIAGNOSIS — E669 Obesity, unspecified: Secondary | ICD-10-CM | POA: Diagnosis present

## 2021-10-02 DIAGNOSIS — F10939 Alcohol use, unspecified with withdrawal, unspecified: Secondary | ICD-10-CM | POA: Insufficient documentation

## 2021-10-02 DIAGNOSIS — R569 Unspecified convulsions: Secondary | ICD-10-CM

## 2021-10-02 DIAGNOSIS — N179 Acute kidney failure, unspecified: Secondary | ICD-10-CM | POA: Diagnosis present

## 2021-10-02 DIAGNOSIS — D696 Thrombocytopenia, unspecified: Secondary | ICD-10-CM | POA: Diagnosis present

## 2021-10-02 DIAGNOSIS — R509 Fever, unspecified: Secondary | ICD-10-CM | POA: Diagnosis present

## 2021-10-02 DIAGNOSIS — K760 Fatty (change of) liver, not elsewhere classified: Secondary | ICD-10-CM | POA: Diagnosis present

## 2021-10-02 DIAGNOSIS — Z20822 Contact with and (suspected) exposure to covid-19: Secondary | ICD-10-CM | POA: Diagnosis present

## 2021-10-02 DIAGNOSIS — E860 Dehydration: Secondary | ICD-10-CM | POA: Diagnosis present

## 2021-10-02 DIAGNOSIS — F10231 Alcohol dependence with withdrawal delirium: Principal | ICD-10-CM | POA: Diagnosis present

## 2021-10-02 DIAGNOSIS — M6282 Rhabdomyolysis: Secondary | ICD-10-CM | POA: Diagnosis present

## 2021-10-02 DIAGNOSIS — K7010 Alcoholic hepatitis without ascites: Secondary | ICD-10-CM | POA: Diagnosis present

## 2021-10-02 DIAGNOSIS — Z8249 Family history of ischemic heart disease and other diseases of the circulatory system: Secondary | ICD-10-CM

## 2021-10-02 DIAGNOSIS — Z79899 Other long term (current) drug therapy: Secondary | ICD-10-CM

## 2021-10-02 DIAGNOSIS — Z6831 Body mass index (BMI) 31.0-31.9, adult: Secondary | ICD-10-CM

## 2021-10-02 DIAGNOSIS — E871 Hypo-osmolality and hyponatremia: Secondary | ICD-10-CM | POA: Diagnosis present

## 2021-10-02 DIAGNOSIS — D61818 Other pancytopenia: Secondary | ICD-10-CM | POA: Diagnosis present

## 2021-10-02 DIAGNOSIS — G4089 Other seizures: Secondary | ICD-10-CM | POA: Diagnosis present

## 2021-10-02 DIAGNOSIS — I10 Essential (primary) hypertension: Secondary | ICD-10-CM | POA: Diagnosis present

## 2021-10-02 LAB — CBC WITH DIFFERENTIAL/PLATELET
Abs Immature Granulocytes: 0.02 10*3/uL (ref 0.00–0.07)
Basophils Absolute: 0 10*3/uL (ref 0.0–0.1)
Basophils Relative: 0 %
Eosinophils Absolute: 0 10*3/uL (ref 0.0–0.5)
Eosinophils Relative: 0 %
HCT: 40.2 % (ref 39.0–52.0)
Hemoglobin: 14.1 g/dL (ref 13.0–17.0)
Immature Granulocytes: 1 %
Lymphocytes Relative: 18 %
Lymphs Abs: 0.5 10*3/uL — ABNORMAL LOW (ref 0.7–4.0)
MCH: 32 pg (ref 26.0–34.0)
MCHC: 35.1 g/dL (ref 30.0–36.0)
MCV: 91.2 fL (ref 80.0–100.0)
Monocytes Absolute: 0.4 10*3/uL (ref 0.1–1.0)
Monocytes Relative: 14 %
Neutro Abs: 1.9 10*3/uL (ref 1.7–7.7)
Neutrophils Relative %: 67 %
Platelets: 82 10*3/uL — ABNORMAL LOW (ref 150–400)
RBC: 4.41 MIL/uL (ref 4.22–5.81)
RDW: 15.3 % (ref 11.5–15.5)
WBC: 2.9 10*3/uL — ABNORMAL LOW (ref 4.0–10.5)
nRBC: 0 % (ref 0.0–0.2)

## 2021-10-02 LAB — RESP PANEL BY RT-PCR (FLU A&B, COVID) ARPGX2
Influenza A by PCR: NEGATIVE
Influenza B by PCR: NEGATIVE
SARS Coronavirus 2 by RT PCR: NEGATIVE

## 2021-10-02 LAB — OSMOLALITY: Osmolality: 269 mOsm/kg — ABNORMAL LOW (ref 275–295)

## 2021-10-02 LAB — COMPREHENSIVE METABOLIC PANEL
ALT: 865 U/L — ABNORMAL HIGH (ref 0–44)
AST: 1718 U/L — ABNORMAL HIGH (ref 15–41)
Albumin: 4.1 g/dL (ref 3.5–5.0)
Alkaline Phosphatase: 145 U/L — ABNORMAL HIGH (ref 38–126)
Anion gap: 18 — ABNORMAL HIGH (ref 5–15)
BUN: 14 mg/dL (ref 6–20)
CO2: 15 mmol/L — ABNORMAL LOW (ref 22–32)
Calcium: 8.4 mg/dL — ABNORMAL LOW (ref 8.9–10.3)
Chloride: 88 mmol/L — ABNORMAL LOW (ref 98–111)
Creatinine, Ser: 1.29 mg/dL — ABNORMAL HIGH (ref 0.61–1.24)
GFR, Estimated: >60 mL/min (ref >60)
Glucose, Bld: 93 mg/dL (ref 70–99)
Potassium: 3.8 mmol/L (ref 3.5–5.1)
Sodium: 121 mmol/L — ABNORMAL LOW (ref 135–145)
Total Bilirubin: 3.1 mg/dL — ABNORMAL HIGH (ref 0.3–1.2)
Total Protein: 7.4 g/dL (ref 6.5–8.1)

## 2021-10-02 LAB — LACTIC ACID, PLASMA
Lactic Acid, Venous: 3.1 mmol/L (ref 0.5–1.9)
Lactic Acid, Venous: 1.5 mmol/L (ref 0.5–1.9)

## 2021-10-02 LAB — ETHANOL: Alcohol, Ethyl (B): <10 mg/dL (ref <10)

## 2021-10-02 LAB — CK: Total CK: 4561 U/L — ABNORMAL HIGH (ref 49–397)

## 2021-10-02 LAB — AMMONIA: Ammonia: 39 umol/L — ABNORMAL HIGH (ref 9–35)

## 2021-10-02 MED ORDER — LEVETIRACETAM IN NACL 500 MG/100ML IV SOLN
500.0000 mg | Freq: Two times a day (BID) | INTRAVENOUS | Status: DC
  Administered 2021-10-02: 500 mg via INTRAVENOUS
  Filled 2021-10-02 (×2): qty 100

## 2021-10-02 MED ORDER — LACTATED RINGERS IV SOLN: INTRAVENOUS | Status: DC

## 2021-10-02 MED ORDER — LACTATED RINGERS IV BOLUS
1000.0000 mL | Freq: Once | INTRAVENOUS | Status: AC
  Administered 2021-10-02: 1000 mL via INTRAVENOUS

## 2021-10-02 MED ORDER — CHLORDIAZEPOXIDE HCL 5 MG PO CAPS
25.0000 mg | ORAL_CAPSULE | Freq: Every day | ORAL | Status: AC
  Administered 2021-10-06: 25 mg via ORAL
  Filled 2021-10-02: qty 5

## 2021-10-02 MED ORDER — ADULT MULTIVITAMIN W/MINERALS CH
1.0000 | ORAL_TABLET | Freq: Every day | ORAL | Status: DC
  Administered 2021-10-02 – 2021-10-08 (×7): 1 via ORAL
  Filled 2021-10-02 (×7): qty 1

## 2021-10-02 MED ORDER — SODIUM CHLORIDE 0.9 % IV SOLN: INTRAVENOUS | Status: DC

## 2021-10-02 MED ORDER — LOPERAMIDE HCL 2 MG PO CAPS: 2.0000 mg | ORAL_CAPSULE | ORAL | Status: AC | PRN

## 2021-10-02 MED ORDER — CHLORHEXIDINE GLUCONATE CLOTH 2 % EX PADS
6.0000 | MEDICATED_PAD | Freq: Every day | CUTANEOUS | Status: DC
  Administered 2021-10-02: 6 via TOPICAL

## 2021-10-02 MED ORDER — CHLORDIAZEPOXIDE HCL 5 MG PO CAPS
25.0000 mg | ORAL_CAPSULE | ORAL | Status: AC
  Administered 2021-10-05 (×2): 25 mg via ORAL
  Filled 2021-10-02 (×2): qty 5

## 2021-10-02 MED ORDER — ONDANSETRON HCL 4 MG PO TABS: 4.0000 mg | ORAL_TABLET | Freq: Four times a day (QID) | ORAL | Status: DC | PRN

## 2021-10-02 MED ORDER — LORAZEPAM 2 MG/ML IJ SOLN
2.0000 mg | Freq: Once | INTRAMUSCULAR | Status: AC
  Administered 2021-10-02: 2 mg via INTRAVENOUS
  Filled 2021-10-02: qty 1

## 2021-10-02 MED ORDER — ACETAMINOPHEN 325 MG PO TABS
650.0000 mg | ORAL_TABLET | Freq: Once | ORAL | Status: AC
  Administered 2021-10-02: 650 mg via ORAL
  Filled 2021-10-02: qty 2

## 2021-10-02 MED ORDER — SODIUM CHLORIDE 0.9 % IV BOLUS
1000.0000 mL | Freq: Once | INTRAVENOUS | Status: AC
  Administered 2021-10-02: 1000 mL via INTRAVENOUS

## 2021-10-02 MED ORDER — ONDANSETRON HCL 4 MG/2ML IJ SOLN: 4.0000 mg | Freq: Four times a day (QID) | INTRAMUSCULAR | Status: DC | PRN

## 2021-10-02 MED ORDER — FOLIC ACID 1 MG PO TABS
1.0000 mg | ORAL_TABLET | Freq: Every day | ORAL | Status: DC
  Administered 2021-10-02 – 2021-10-08 (×7): 1 mg via ORAL
  Filled 2021-10-02 (×7): qty 1

## 2021-10-02 MED ORDER — LORAZEPAM 2 MG/ML IJ SOLN
1.0000 mg | INTRAMUSCULAR | Status: AC | PRN
  Administered 2021-10-03: 1 mg via INTRAVENOUS
  Administered 2021-10-04 – 2021-10-05 (×4): 2 mg via INTRAVENOUS
  Filled 2021-10-02 (×5): qty 1

## 2021-10-02 MED ORDER — THIAMINE HCL 100 MG/ML IJ SOLN: 100.0000 mg | Freq: Every day | INTRAMUSCULAR | Status: DC

## 2021-10-02 MED ORDER — IBUPROFEN 200 MG PO TABS
400.0000 mg | ORAL_TABLET | Freq: Four times a day (QID) | ORAL | Status: AC | PRN
  Administered 2021-10-02 – 2021-10-03 (×2): 400 mg via ORAL
  Filled 2021-10-02 (×3): qty 2

## 2021-10-02 MED ORDER — SODIUM CHLORIDE 0.9% FLUSH
3.0000 mL | Freq: Two times a day (BID) | INTRAVENOUS | Status: DC
  Administered 2021-10-03 – 2021-10-08 (×4): 3 mL via INTRAVENOUS

## 2021-10-02 MED ORDER — LORAZEPAM 1 MG PO TABS
1.0000 mg | ORAL_TABLET | ORAL | Status: AC | PRN
  Administered 2021-10-02 – 2021-10-05 (×7): 1 mg via ORAL
  Filled 2021-10-02 (×7): qty 1

## 2021-10-02 MED ORDER — CHLORDIAZEPOXIDE HCL 5 MG PO CAPS
25.0000 mg | ORAL_CAPSULE | Freq: Three times a day (TID) | ORAL | Status: AC
  Administered 2021-10-04 (×3): 25 mg via ORAL
  Filled 2021-10-02 (×3): qty 5

## 2021-10-02 MED ORDER — CHLORDIAZEPOXIDE HCL 5 MG PO CAPS
25.0000 mg | ORAL_CAPSULE | Freq: Four times a day (QID) | ORAL | Status: AC
  Administered 2021-10-02 – 2021-10-03 (×5): 25 mg via ORAL
  Filled 2021-10-02: qty 5
  Filled 2021-10-02: qty 1
  Filled 2021-10-02: qty 5
  Filled 2021-10-02 (×2): qty 1

## 2021-10-02 MED ORDER — THIAMINE HCL 100 MG PO TABS
100.0000 mg | ORAL_TABLET | Freq: Every day | ORAL | Status: DC
  Administered 2021-10-02 – 2021-10-08 (×7): 100 mg via ORAL
  Filled 2021-10-02 (×7): qty 1

## 2021-10-02 NOTE — ED Provider Notes (Signed)
?[facility] ?Provider Note ? ? ?CSN: [id number] ?Arrival date & time: 10/02/21  1501 ? ?  ? ?History ? ?Chief Complaint  ?Patient presents with  ? Seizures  ? ? ?Brett Molina is a 31 y.o. male. ? ?31 year old male who resents after witnessed seizure by his wife.  Patient drinks daily and has not drank for several days.  He has noted increased tremors.  Denies any recent cold or flu symptoms.  States he does have a history of seizures in the past that are related to alcohol withdrawal and to possibly underlying seizure disorder.  He is supposed be on Keppra but is not taking it currently.  No cough or congestion.  Patient's wife called EMS and patient was postictal on arrival.  EMS gave patient 5 mg of Versed and transported here ? ? ?  ? ?Home Medications ?Prior to Admission medications   ?Medication Sig Start Date End Date Taking? Authorizing Provider  ?amLODipine (NORVASC) 10 MG tablet Take 1 tablet (10 mg total) by mouth daily. ?Patient not taking: Reported on 06/19/2021 01/27/21 02/26/21  Barb Merino, MD  ?chlordiazePOXIDE (LIBRIUM) 10 MG capsule 1 tablet 3 times a day for 2 days 1 tablet 2 times a day for 2 days 1 tablet once a day for 2 days ?Patient not taking: Reported on 06/19/2021 01/27/21   Barb Merino, MD  ?chlordiazePOXIDE (LIBRIUM) 25 MG capsule 50mg  PO TID x 1D, then 25-50mg  PO BID X 1D, then 25-50mg  PO QD X 1D 06/19/21   Valarie Merino, MD  ?levETIRAcetam (KEPPRA) 500 MG tablet Take 1 tablet (500 mg total) by mouth 2 (two) times daily. ?Patient not taking: Reported on 06/19/2021 01/27/21 02/26/21  Barb Merino, MD  ?lisinopril (ZESTRIL) 20 MG tablet Take 1 tablet (20 mg total) by mouth daily. ?Patient not taking: Reported on 06/19/2021 01/27/21 02/26/21  Barb Merino, MD  ?   ? ?Allergies    ?Patient has no known allergies.   ? ?Review of Systems   ?Review of Systems  ?All other systems reviewed and are negative. ? ?Physical Exam ?Updated Vital Signs ?BP  (!) 146/77 (BP Location: Right Arm)   Pulse (!) 139   Temp (!) 100.6 ?F (38.1 ?C) (Oral)   Resp (!) 29   Ht 1.88 m (6\' 2" )   Wt 108.9 kg   SpO2 98%   BMI 30.81 kg/m?  ?Physical Exam ?Vitals and nursing note reviewed.  ?Constitutional:   ?   General: He is not in acute distress. ?   Appearance: Normal appearance. He is well-developed. He is not toxic-appearing.  ?HENT:  ?   Head: Normocephalic and atraumatic.  ?Eyes:  ?   General: Lids are normal.  ?   Conjunctiva/sclera: Conjunctivae normal.  ?   Pupils: Pupils are equal, round, and reactive to light.  ?Neck:  ?   Thyroid: No thyroid mass.  ?   Trachea: No tracheal deviation.  ?   Comments: No nuchal rigidity noted. ?Cardiovascular:  ?   Rate and Rhythm: Regular rhythm. Tachycardia present.  ?   Heart sounds: Normal heart sounds. No murmur heard. ?  No gallop.  ?Pulmonary:  ?   Effort: Pulmonary effort is normal. No respiratory distress.  ?   Breath sounds: Normal breath sounds. No stridor. No decreased breath sounds, wheezing, rhonchi or rales.  ?Abdominal:  ?   General: There is no distension.  ?   Palpations: Abdomen is soft.  ?   Tenderness: There is  no abdominal tenderness. There is no rebound.  ?Musculoskeletal:     ?   General: No tenderness. Normal range of motion.  ?   Cervical back: Normal range of motion and neck supple.  ?Skin: ?   General: Skin is warm and dry.  ?   Findings: No abrasion or rash.  ?Neurological:  ?   General: No focal deficit present.  ?   Mental Status: He is alert and oriented to person, place, and time.  ?   GCS: GCS eye subscore is 4. GCS verbal subscore is 5. GCS motor subscore is 6.  ?   Cranial Nerves: No cranial nerve deficit.  ?   Sensory: No sensory deficit.  ?   Motor: Motor function is intact.  ?Psychiatric:     ?   Attention and Perception: Attention normal.     ?   Speech: Speech normal.     ?   Behavior: Behavior normal.  ? ? ?ED Results / Procedures / Treatments   ?Labs ?(all labs ordered are listed, but only  abnormal results are displayed) ?Labs Reviewed  ?CULTURE, BLOOD (ROUTINE X 2)  ?CULTURE, BLOOD (ROUTINE X 2)  ?RESP PANEL BY RT-PCR (FLU A&B, COVID) ARPGX2  ?LACTIC ACID, PLASMA  ?CBC WITH DIFFERENTIAL/PLATELET  ?COMPREHENSIVE METABOLIC PANEL  ?ETHANOL  ?RAPID URINE DRUG SCREEN, HOSP PERFORMED  ? ? ?EKG ?None ? ?Radiology ?No results found. ? ?Procedures ?Procedures  ? ? ?Medications Ordered in ED ?Medications  ?acetaminophen (TYLENOL) tablet 650 mg (has no administration in time range)  ?LORazepam (ATIVAN) injection 2 mg (has no administration in time range)  ?lactated ringers infusion (has no administration in time range)  ?lactated ringers bolus 1,000 mL (has no administration in time range)  ? ? ?ED Course/ Medical Decision Making/ A&P ?  ?                        ?Medical Decision Making ?Amount and/or Complexity of Data Reviewed ?Labs: ordered. ?Radiology: ordered. ? ?Risk ?OTC drugs. ?Prescription drug management. ? ? ?Patient presented with alcohol withdrawal.  Spoke to his wife who states that patient has been cutting back on his alcohol consumption recently.  Does have a history of alcohol withdrawal seizures.  Patient given 2 mg of Ativan here.  Patient was febrile to 100.6.  Suspect this from his seizures.  He has not had any recent cold or flulike symptoms.  Does not have any nuchal rigidity.  Do not feel that this represents meningitis.  Do not feel that he needs to have a lumbar puncture.  Lactate was slightly elevated here likely due to anesthesia.  Chest x-ray was negative per my interpretation.  COVID and flu test negative.  Patient's electrolytes significant for severe hyponatremia with sodium of 121.  He also has a transaminitis with an AST of 1700 and ALT of 865  His total bili is up to 3.1.  Likely from alcohol associated damage.  We will add an ammonia level to his labs.  Patient also given IV saline.  He is mildly acidotic.  Patient rechecked and is maintaining appropriate this time.  He will  require hospitalization.  Will consult hospitalist for admission ? ?CRITICAL CARE ?Performed by: Leota Jacobsen ?Total critical care time: 60 minutes ?Critical care time was exclusive of separately billable procedures and treating other patients. ?Critical care was necessary to treat or prevent imminent or life-threatening deterioration. ?Critical care was time spent personally by me on  the following activities: development of treatment plan with patient and/or surrogate as well as nursing, discussions with consultants, evaluation of patient's response to treatment, examination of patient, obtaining history from patient or surrogate, ordering and performing treatments and interventions, ordering and review of laboratory studies, ordering and review of radiographic studies, pulse oximetry and re-evaluation of patient's condition. ? ? ? ? ? ? ? ? ?Final Clinical Impression(s) / ED Diagnoses ?Final diagnoses:  ?None  ? ? ?Rx / DC Orders ?ED Discharge Orders   ? ? None  ? ?  ? ? ?  ?Lacretia Leigh, MD ?10/02/21 1754 ? ?

## 2021-10-02 NOTE — Plan of Care (Signed)

## 2021-10-02 NOTE — Assessment & Plan Note (Addendum)
Patient admitted with active alcohol withdrawal with seizure-like activity prior to arrival. ?-Remains stable.  Completing Librium taper. ?-Continue CIWA with as needed Ativan ?-Continue thiamine, folic acid and multivitamin ?-Continue home Keppra ?-Continue seizure precaution ?-De-escalate care to telemetry ?

## 2021-10-02 NOTE — ED Notes (Signed)
Pt is AxO x 4, states that he drinks 2-3 times a week and drinks a small bottle of liquor, pt states that he has not drank in the past 2 days. Hx of withdraw seizures in the past.  ?

## 2021-10-02 NOTE — Hospital Course (Addendum)
31 year old M with PMH of EtOH dependence, withdrawal seizures and DTs who is admitted for alcohol withdrawal with seizure-like activity, elevated LFT, rhabdomyolysis, pancytopenia and fever.  He was started on IV fluid, Librium taper and CIWA with as needed Ativan.  Fever felt to be due to seizure.  Infectious work-up unrevealing.  Pancytopenia resolved except for mild anemia.  LFT improved.  Remains on IV fluid for rhabdomyolysis.  CK peaked at 6000, now downtrending. ?

## 2021-10-02 NOTE — Assessment & Plan Note (Addendum)
Likely from alcohol.  Leukopenia thrombocytopenia improved.  Mild anemia stable. ?-Check anemia panel ?

## 2021-10-02 NOTE — Assessment & Plan Note (Addendum)
Febrile to 101 on admission.  Has not had further fever.  No clear source of infection.  Infectious work-up unrevealing.  Leukopenia resolved. ?

## 2021-10-02 NOTE — Assessment & Plan Note (Addendum)
Sodium 121 on admission.  Likely dehydration and beer potomania.  Resolved. ?-Continue IV NS negative for rhabdo. ?

## 2021-10-02 NOTE — Assessment & Plan Note (Addendum)
AST 1718, ALT 865, alk phos 145, T. bili 3.1 on admission.  Likely due to EtOH and rhabdo.  No GI symptoms.  RUQ US shows hepatic steatosis.  Improved. ?-Continue IV fluid for rhabdo ?-Continue monitoring LFTs ?-Encouraged alcohol cessation and moderation. ?-TOC for resources and counseling. ?

## 2021-10-02 NOTE — ED Triage Notes (Signed)
Pt bib [facility] from work s/p witnessed seizure.  Pt's wife witnessed seizure and reports to EMS grand mal activity lasting approximately 10 minutes. Pt post ictal on EMS arrival. EMS gave 5 mg versed IM PTA.  Pt is non compliant w/ seizure medications and is currently withdrawing from ETOH.  ?

## 2021-10-02 NOTE — H&P (Addendum)
?History and Physical  ? ? ?Brett Molina MGN:[id number] DOB: 1990-10-10 DOA: 10/02/2021 ? ?PCP: Charlott Rakes, MD  ?Patient coming from: Home via EMS ? ?I have personally briefly reviewed patient's old medical records in [place] ? ?Chief Complaint: Seizure-like activity ? ?HPI: ?Brett Molina is a 31 y.o. male with medical history significant for alcohol use disorder with history of withdrawal seizures and delirium tremens who presented to the ED for evaluation of seizure-like activity.  History is supplemented by EDP and chart review. ? ?Patient reports chronic alcohol use, reportedly a fifth of liquor daily.  He says his last drink was about 1 week ago although nursing reports that he told him his last drink was 2 days ago.  He had apparent seizure-like activity witnessed by his wife.  EMS were called and he appeared to be postictal on their arrival.  He was given 5 mg of Versed IM prior to ED arrival. ? ?At time of admitting evaluation, patient is awake, alert, oriented.  He is tremulous and diaphoretic.  He does not recall the events of the day.  He denies any nausea, vomiting, abdominal pain, chest pain, or dyspnea.  He has not seen any obvious bleeding. ? ?Patient does have history of alcohol withdrawal seizure.  He was previously prescribed Keppra 500 mg twice daily however has apparently not been taking this recently. ? ?ED Course  Labs/Imaging on admission: I have personally reviewed following labs and imaging studies. ? ?Initial vitals showed BP 156/87, pulse 138, RR 25, temp 100.6 ?F, SPO2 97% on room air. ? ?Labs show WBC 2.9, hemoglobin 14.1, platelets 82,000, AST 1007 and 18, ALT 865, alk phos 145, total bilirubin 3.1, sodium 121, potassium 3.8, bicarb 15, BUN 14, creatinine 1.29, serum glucose 93, lactic acid 3.1 > 1.5, serum ethanol <10.  Ammonia is 39. ? ?SARS-CoV-2 and influenza PCR negative.  Blood cultures in process. ? ?Portable chest x-ray negative for focal  consolidation, edema, effusion. ? ?Patient was given 1 L LR, 1 L normal saline, 2 mg IV Ativan, 650 mg Tylenol.  The hospitalist service was consulted to admit for further evaluation and management. ? ?Review of Systems: All systems reviewed and are negative except as documented in history of present illness above. ? ? ?Past Medical History:  ?Diagnosis Date  ? ETOH abuse   ? Hypertension   ? ? ?Past Surgical History:  ?Procedure Laterality Date  ? ESOPHAGOGASTRODUODENOSCOPY N/A 07/24/2018  ? Procedure: ESOPHAGOGASTRODUODENOSCOPY (EGD);  Surgeon: Milus Banister, MD;  Location: [facility];  Service: Endoscopy;  Laterality: N/A;  ? ? ?Social History: ? reports that he has never smoked. He has never used smokeless tobacco. He reports current alcohol use of about 14.0 standard drinks per week. He reports that he does not use drugs. ? ?No Known Allergies ? ?Family History  ?Problem Relation Age of Onset  ? Hypertension Mother   ? Hypertension Father   ? ? ? ?Prior to Admission medications   ?Medication Sig Start Date End Date Taking? Authorizing Provider  ?amLODipine (NORVASC) 10 MG tablet Take 1 tablet (10 mg total) by mouth daily. ?Patient not taking: Reported on 06/19/2021 01/27/21 02/26/21  Barb Merino, MD  ?chlordiazePOXIDE (LIBRIUM) 10 MG capsule 1 tablet 3 times a day for 2 days 1 tablet 2 times a day for 2 days 1 tablet once a day for 2 days ?Patient not taking: Reported on 06/19/2021 01/27/21   Barb Merino, MD  ?chlordiazePOXIDE (LIBRIUM) 25 MG capsule $RemoveBe'50mg'[id number]$   PO TID x 1D, then 25-65m PO BID X 1D, then 25-574mPO QD X 1D 06/19/21   MeValarie MerinoMD  ?levETIRAcetam (KEPPRA) 500 MG tablet Take 1 tablet (500 mg total) by mouth 2 (two) times daily. ?Patient not taking: Reported on 06/19/2021 01/27/21 02/26/21  GhBarb MerinoMD  ?lisinopril (ZESTRIL) 20 MG tablet Take 1 tablet (20 mg total) by mouth daily. ?Patient not taking: Reported on 06/19/2021 01/27/21 02/26/21  GhBarb MerinoMD  ? ? ?Physical  Exam: ?Vitals:  ? 10/02/21 1900 10/02/21 1941 10/02/21 1945 10/02/21 2109  ?BP: 132/73 (!) 141/76 (!) 141/76   ?Pulse: (!) 110 (!) 111 (!) 114   ?Resp: (!) 27  (!) 26   ?Temp:    (!) 101 ?F (38.3 ?C)  ?TempSrc:    Oral  ?SpO2: 98%  100%   ?Weight:      ?Height:      ? ?Constitutional: Resting in bed, diaphoretic ?Eyes: PERRL, lids and conjunctivae normal ?ENMT: Mucous membranes are dry.  Bite mark lower lip. ?Neck: normal, supple, no masses. ?Respiratory: clear to auscultation bilaterally, no wheezing, no crackles. Normal respiratory effort. No accessory muscle use.  ?Cardiovascular: Tachycardic, no murmurs / rubs / gallops. No extremity edema. 2+ pedal pulses. ?Abdomen: no tenderness, no masses palpated. Bowel sounds positive.  ?Musculoskeletal: no clubbing / cyanosis. No joint deformity upper and lower extremities. Good ROM, no contractures. Normal muscle tone.  ?Skin: Diaphoretic.  No rashes, lesions, ulcers. No induration ?Neurologic: Tremulous. Sensation intact. Strength 5/5 in all 4.  ?Psychiatric: Alert and oriented x 3. ? ?EKG: Personally reviewed. Sinus tachycardia, rate 112, no acute ischemic changes.  Rate is faster when compared to prior. ? ?Assessment/Plan ?Principal Problem: ?  Alcohol withdrawal seizure with complication (HCLenawee?Active Problems: ?  Alcoholic hepatitis without ascites ?  Hyponatremia ?  Fever ?  AKI (acute kidney injury) (HCOxbow?  Thrombocytopenia (HCBir[place]?  ?DeTalmage TeastercGill is a [age].o. male with medical history significant for alcohol use disorder with history of withdrawal seizures and delirium tremens who is admitted for alcohol withdrawal with seizure-like activity. ? ?Assessment and Plan: ?* Alcohol withdrawal seizure with complication (HCSimonton Lake?Patient admitted with active alcohol withdrawal.  Had reported seizure-like activity prior to hospital arrival.  Has history of same, previously on Keppra but has not been adherent.  He currently denies any hallucinations. ?-High risk for  severe withdrawal, admit to stepdown unit ?-Started on CIWA protocol with Ativan as needed ?-Start on Librium taper ?-Start IV Keppra 500 mg twice daily ?-Seizure precautions ? ?Alcoholic hepatitis without ascites ?AST 1718, ALT 865, alk phos 145, T. bili 3.1 on admission.  Suspect alcoholic hepatitis.  Patient denies any abdominal pain. ?-RUQ ultrasound shows hepatic steatosis ?-Continue supportive care and repeat labs in a.m. ? ?Hyponatremia ?Sodium 121 on admission.  Appears volume depleted on admission, also likely alcohol associated. ?-Continue IV NS_0  mL/hour overnight ? ?Fever ?Patient with fever 101 ?F on admission.  No obvious infectious etiology at this time.  Potentially secondary to seizure-like activity.  No abdominal pain on exam.  Does have mild leukopenia without neutropenia.  Follow blood cultures, obtain urinalysis. ? ?AKI (acute kidney injury) (HCReed[place]?Mild with creatinine 1.29 on admission.  Likely related to volume depletion and mild rhabdomyolysis due to alcohol withdrawal seizure.  CK 4561. ?-Continue IV fluid hydration as above ?-Repeat CMP and CK in a.m. ? ?Thrombocytopenia (HCPiney Point Village?New thrombocytopenia with platelets 82,000 likely secondary to acute alcoholic hepatitis.  Denies any obvious  bleeding.  Continue to monitor. ? ?DVT prophylaxis: SCDs Start: 10/02/21 1939 ?Code Status: Full code ?Family Communication: None present on admission ?Disposition Plan: From home, dispo pending clinical progress ?Consults called: None ?Severity of Illness: ?The appropriate patient status for this patient is INPATIENT. Inpatient status is judged to be reasonable and necessary in order to provide the required intensity of service to ensure the patient's safety. [name] presenting symptoms, physical exam findings, and initial radiographic and laboratory data in the context of their chronic comorbidities is felt to place them at high risk for further clinical deterioration. Furthermore, it is not  anticipated that the patient will be medically stable for discharge from the hospital within 2 midnights of admission.  ? ?* I certify that at the point of admission it is my clinical judgment that the patient will require inpatient

## 2021-10-02 NOTE — Assessment & Plan Note (Addendum)
Recent Labs  ?  01/27/21 ?0333 06/19/21 ?1052 10/02/21 ?1532 10/03/21 ?0251 10/03/21 ?3833 10/03/21 ?1510 10/04/21 ?3832 10/05/21 ?9191 10/06/21 ?1017 10/07/21 ?0324  ?BUN 7 5* 14 12 9 8 7  <5* <5* 6  ?CREATININE 0.82 0.89 1.29* 1.10 0.89 0.87 0.88 0.84 0.71 0.76  ?Likely prerenal from dehydration and ATN from rhabdomyolysis.  Resolved. ?

## 2021-10-03 DIAGNOSIS — M6282 Rhabdomyolysis: Secondary | ICD-10-CM

## 2021-10-03 LAB — URINALYSIS, ROUTINE W REFLEX MICROSCOPIC
Bilirubin Urine: NEGATIVE
Glucose, UA: NEGATIVE mg/dL
Ketones, ur: 20 mg/dL — AB
Leukocytes,Ua: NEGATIVE
Nitrite: NEGATIVE
Protein, ur: 100 mg/dL — AB
Specific Gravity, Urine: 1.009 (ref 1.005–1.030)
pH: 6 (ref 5.0–8.0)

## 2021-10-03 LAB — BASIC METABOLIC PANEL
Anion gap: 12 (ref 5–15)
Anion gap: 9 (ref 5–15)
BUN: 9 mg/dL (ref 6–20)
BUN: 8 mg/dL (ref 6–20)
CO2: 18 mmol/L — ABNORMAL LOW (ref 22–32)
CO2: 23 mmol/L (ref 22–32)
Calcium: 8.4 mg/dL — ABNORMAL LOW (ref 8.9–10.3)
Calcium: 8.8 mg/dL — ABNORMAL LOW (ref 8.9–10.3)
Chloride: 99 mmol/L (ref 98–111)
Chloride: 98 mmol/L (ref 98–111)
Creatinine, Ser: 0.89 mg/dL (ref 0.61–1.24)
Creatinine, Ser: 0.87 mg/dL (ref 0.61–1.24)
GFR, Estimated: >60 mL/min (ref >60)
GFR, Estimated: >60 mL/min (ref >60)
Glucose, Bld: 79 mg/dL (ref 70–99)
Glucose, Bld: 92 mg/dL (ref 70–99)
Potassium: 3.5 mmol/L (ref 3.5–5.1)
Potassium: 4 mmol/L (ref 3.5–5.1)
Sodium: 129 mmol/L — ABNORMAL LOW (ref 135–145)
Sodium: 130 mmol/L — ABNORMAL LOW (ref 135–145)

## 2021-10-03 LAB — RAPID URINE DRUG SCREEN, HOSP PERFORMED
Amphetamines: NOT DETECTED
Barbiturates: NOT DETECTED
Benzodiazepines: POSITIVE — AB
Cocaine: NOT DETECTED
Opiates: NOT DETECTED
Tetrahydrocannabinol: NOT DETECTED

## 2021-10-03 LAB — COMPREHENSIVE METABOLIC PANEL
ALT: 643 U/L — ABNORMAL HIGH (ref 0–44)
AST: 1125 U/L — ABNORMAL HIGH (ref 15–41)
Albumin: 3.4 g/dL — ABNORMAL LOW (ref 3.5–5.0)
Alkaline Phosphatase: 117 U/L (ref 38–126)
Anion gap: 12 (ref 5–15)
BUN: 12 mg/dL (ref 6–20)
CO2: 20 mmol/L — ABNORMAL LOW (ref 22–32)
Calcium: 8.1 mg/dL — ABNORMAL LOW (ref 8.9–10.3)
Chloride: 95 mmol/L — ABNORMAL LOW (ref 98–111)
Creatinine, Ser: 1.1 mg/dL (ref 0.61–1.24)
GFR, Estimated: >60 mL/min (ref >60)
Glucose, Bld: 81 mg/dL (ref 70–99)
Potassium: 3.5 mmol/L (ref 3.5–5.1)
Sodium: 127 mmol/L — ABNORMAL LOW (ref 135–145)
Total Bilirubin: 2.9 mg/dL — ABNORMAL HIGH (ref 0.3–1.2)
Total Protein: 6.4 g/dL — ABNORMAL LOW (ref 6.5–8.1)

## 2021-10-03 LAB — CBC
HCT: 38.2 % — ABNORMAL LOW (ref 39.0–52.0)
Hemoglobin: 12.9 g/dL — ABNORMAL LOW (ref 13.0–17.0)
MCH: 32.3 pg (ref 26.0–34.0)
MCHC: 33.8 g/dL (ref 30.0–36.0)
MCV: 95.7 fL (ref 80.0–100.0)
Platelets: 74 10*3/uL — ABNORMAL LOW (ref 150–400)
RBC: 3.99 MIL/uL — ABNORMAL LOW (ref 4.22–5.81)
RDW: 16.2 % — ABNORMAL HIGH (ref 11.5–15.5)
WBC: 2.8 10*3/uL — ABNORMAL LOW (ref 4.0–10.5)
nRBC: 0 % (ref 0.0–0.2)

## 2021-10-03 LAB — MAGNESIUM: Magnesium: 2.3 mg/dL (ref 1.7–2.4)

## 2021-10-03 LAB — SODIUM, URINE, RANDOM: Sodium, Ur: 33 mmol/L

## 2021-10-03 LAB — HEPATITIS PANEL, ACUTE
HCV Ab: NONREACTIVE
Hep A IgM: NONREACTIVE
Hep B C IgM: NONREACTIVE
Hepatitis B Surface Ag: NONREACTIVE

## 2021-10-03 LAB — PROTIME-INR
INR: 1 (ref 0.8–1.2)
Prothrombin Time: 13.6 seconds (ref 11.4–15.2)

## 2021-10-03 LAB — OSMOLALITY, URINE: Osmolality, Ur: 339 mOsm/kg (ref 300–900)

## 2021-10-03 LAB — MRSA NEXT GEN BY PCR, NASAL: MRSA by PCR Next Gen: NOT DETECTED

## 2021-10-03 LAB — CK: Total CK: 3586 U/L — ABNORMAL HIGH (ref 49–397)

## 2021-10-03 MED ORDER — LISINOPRIL 20 MG PO TABS
20.0000 mg | ORAL_TABLET | Freq: Every day | ORAL | Status: DC
  Administered 2021-10-03 – 2021-10-08 (×6): 20 mg via ORAL
  Filled 2021-10-03: qty 2
  Filled 2021-10-03 (×5): qty 1

## 2021-10-03 MED ORDER — ACETAMINOPHEN 325 MG PO TABS
325.0000 mg | ORAL_TABLET | Freq: Once | ORAL | Status: AC
  Administered 2021-10-03: 325 mg via ORAL
  Filled 2021-10-03: qty 1

## 2021-10-03 MED ORDER — LEVETIRACETAM 500 MG PO TABS
500.0000 mg | ORAL_TABLET | Freq: Two times a day (BID) | ORAL | Status: DC
Start: 2021-10-03 — End: 2021-10-08
  Administered 2021-10-03 – 2021-10-08 (×11): 500 mg via ORAL
  Filled 2021-10-03 (×11): qty 1

## 2021-10-03 MED ORDER — IBUPROFEN 200 MG PO TABS: 400.0000 mg | ORAL_TABLET | Freq: Four times a day (QID) | ORAL | Status: DC | PRN

## 2021-10-03 MED ORDER — SODIUM CHLORIDE 0.9 % IV SOLN
INTRAVENOUS | Status: DC
  Administered 2021-10-05: 75 mL/h via INTRAVENOUS

## 2021-10-03 MED ORDER — AMLODIPINE BESYLATE 10 MG PO TABS
10.0000 mg | ORAL_TABLET | Freq: Every day | ORAL | Status: DC
  Administered 2021-10-03 – 2021-10-08 (×6): 10 mg via ORAL
  Filled 2021-10-03 (×6): qty 1

## 2021-10-03 NOTE — Progress Notes (Signed)
?  Progress Note ? ? ?Patient: Brett Molina KYH:[id number] DOB: April 12, 1991 DOA: 10/02/2021     1 ?DOS: the patient was seen and examined on 10/03/2021 ?  ?Brief hospital course: ?Nachmen Mansel Spagnuolo is a 31 y.o. male with medical history significant for alcohol use disorder with history of withdrawal seizures and delirium tremens who is admitted for alcohol withdrawal with seizure-like activity. ? ?Assessment and Plan: ?* Alcohol withdrawal seizure with complication (HCC) ?Patient admitted with active alcohol withdrawal.  Had reported seizure-like activity prior to hospital arrival.  Has history of same, previously on Keppra but has not been adherent. No hallucinations on exam. Still very tremulous. ?-High risk for severe withdrawal.  Currently on stepdown unit.  Not requiring any drips. ?-Continue CIWA protocol with Ativan as needed ?-Continue Librium taper ?-Switch IV Keppra 500 mg twice a day to p.o. ?-Continue seizure precautions ? ?Alcoholic hepatitis without ascites ?Improving AST, ALT, ALP and T. bili.  Suspect alcoholic hepatitis.  Patient denies any abdominal pain. ?-RUQ ultrasound shows hepatic steatosis ?-Continue supportive care and repeat labs in a.m. ? ?Hyponatremia ?Sodium 121 on admission.  Improving with sodium 127.  BMP every 6 hours.  Clinically appears volume depleted ?-Continue IV NS@125  mL/hour overnight ? ?Fever ?Patient with fever 101 ?F on admission.  No obvious infectious etiology at this time.  Potentially secondary to seizure-like activity or alcohol withdrawal.  No abdominal pain on exam.  Does have mild leukopenia without neutropenia.  Follow blood cultures.  Obtain urinalysis. ? ?AKI (acute kidney injury) (HCC) ?Resolved ? ?Rhabdomyolysis ?CPK trending down.  Continue IV fluids as mentioned above.  Repeat CPK level in the morning ? ?Thrombocytopenia (HCC) ?Stable. Likely secondary to acute alcoholic hepatitis.  Denies any obvious bleeding.  Continue to monitor. Hold chemical DVT  prophylaxis ? ? ?Subjective: Seen and examined this morning.  Still very tremulous.  No hallucinations.  No seizure-like activity reported.  Tolerating p.o. ? ?Physical Exam: ?Vitals:  ? 10/03/21 0324 10/03/21 0400 10/03/21 0500 10/03/21 0637  ?BP:  (!) 147/95 (!) 156/107   ?Pulse:  100 100   ?Resp:  (!) 23 (!) 22   ?Temp: 98.7 ?F (37.1 ?C) 98.7 ?F (37.1 ?C)  99.2 ?F (37.3 ?C)  ?TempSrc: Oral Oral  Oral  ?SpO2:  99% 98%   ?Weight:      ?Height:      ? ?Constitutional: Resting in bed, no hallucinations ?ENMT: Mucous membranes are dry.  Bite mark lower lip. ?Neck: normal, supple, no masses. ?Respiratory: clear to auscultation bilaterally, no wheezing, no crackles. Normal respiratory effort. No accessory muscle use.  ?Cardiovascular: Tachycardic, no murmurs / rubs / gallops. No extremity edema. 2+ pedal pulses. ?Abdomen: no tenderness, no masses palpated. Bowel sounds positive.  ?Musculoskeletal: no clubbing / cyanosis. No joint deformity upper and lower extremities. Good ROM, no contractures. Normal muscle tone.  ?Skin: Diaphoretic.  No rashes, lesions, ulcers. No induration ?Neurologic: Tremulous. Sensation intact. Strength 5/5 in all 4.  ?Psychiatric: Alert and oriented x 3. ? ?Data Reviewed: Labs, vital signs ? ?Family Communication: No family present at bedside ? ?Disposition: ?Status is: Inpatient ?Remains inpatient appropriate because: Still having active alcohol withdrawal ?Planned Discharge Destination: Home ?DVT prophylaxis: SCDs ? ?Time spent: 35 minutes ? ?Author: ?Verdia Kuba, DO ?10/03/2021 9:14 AM ? ?For on call review [email or web address].  ?

## 2021-10-04 LAB — COMPREHENSIVE METABOLIC PANEL
ALT: 467 U/L — ABNORMAL HIGH (ref 0–44)
AST: 673 U/L — ABNORMAL HIGH (ref 15–41)
Albumin: 3.7 g/dL (ref 3.5–5.0)
Alkaline Phosphatase: 146 U/L — ABNORMAL HIGH (ref 38–126)
Anion gap: 10 (ref 5–15)
BUN: 7 mg/dL (ref 6–20)
CO2: 21 mmol/L — ABNORMAL LOW (ref 22–32)
Calcium: 8.5 mg/dL — ABNORMAL LOW (ref 8.9–10.3)
Chloride: 101 mmol/L (ref 98–111)
Creatinine, Ser: 0.88 mg/dL (ref 0.61–1.24)
GFR, Estimated: >60 mL/min (ref >60)
Glucose, Bld: 96 mg/dL (ref 70–99)
Potassium: 3.2 mmol/L — ABNORMAL LOW (ref 3.5–5.1)
Sodium: 132 mmol/L — ABNORMAL LOW (ref 135–145)
Total Bilirubin: 2.5 mg/dL — ABNORMAL HIGH (ref 0.3–1.2)
Total Protein: 7 g/dL (ref 6.5–8.1)

## 2021-10-04 LAB — CBC WITH DIFFERENTIAL/PLATELET
Abs Immature Granulocytes: 0.01 10*3/uL (ref 0.00–0.07)
Basophils Absolute: 0 10*3/uL (ref 0.0–0.1)
Basophils Relative: 1 %
Eosinophils Absolute: 0 10*3/uL (ref 0.0–0.5)
Eosinophils Relative: 0 %
HCT: 39.4 % (ref 39.0–52.0)
Hemoglobin: 13.3 g/dL (ref 13.0–17.0)
Immature Granulocytes: 0 %
Lymphocytes Relative: 35 %
Lymphs Abs: 0.8 10*3/uL (ref 0.7–4.0)
MCH: 31.9 pg (ref 26.0–34.0)
MCHC: 33.8 g/dL (ref 30.0–36.0)
MCV: 94.5 fL (ref 80.0–100.0)
Monocytes Absolute: 0.4 10*3/uL (ref 0.1–1.0)
Monocytes Relative: 18 %
Neutro Abs: 1.1 10*3/uL — ABNORMAL LOW (ref 1.7–7.7)
Neutrophils Relative %: 46 %
Platelets: 100 10*3/uL — ABNORMAL LOW (ref 150–400)
RBC: 4.17 MIL/uL — ABNORMAL LOW (ref 4.22–5.81)
RDW: 17.1 % — ABNORMAL HIGH (ref 11.5–15.5)
WBC: 2.4 10*3/uL — ABNORMAL LOW (ref 4.0–10.5)
nRBC: 0 % (ref 0.0–0.2)

## 2021-10-04 LAB — MAGNESIUM: Magnesium: 2.4 mg/dL (ref 1.7–2.4)

## 2021-10-04 LAB — PHOSPHORUS: Phosphorus: 3.2 mg/dL (ref 2.5–4.6)

## 2021-10-04 LAB — CK: Total CK: 2654 U/L — ABNORMAL HIGH (ref 49–397)

## 2021-10-04 MED ORDER — POTASSIUM CHLORIDE CRYS ER 20 MEQ PO TBCR
40.0000 meq | EXTENDED_RELEASE_TABLET | Freq: Once | ORAL | Status: AC
  Administered 2021-10-04: 40 meq via ORAL
  Filled 2021-10-04: qty 2

## 2021-10-04 NOTE — TOC Initial Note (Addendum)
Transition of Care (TOC) - Initial/Assessment Note  ? ? ?Patient Details  ?Name: Brett Molina ?MRN: [id number] ?Date of Birth: 1991-01-12 ? ?Transition of Care (TOC) CM/SW Contact:    ?Cecille Po, RN ?Phone Number: ?10/04/2021, 8:37 AM ? ?Clinical Narrative:                 ? ?Received a consult for substance abuse resources.  Patient actively withdrawing from alcohol.  Unable to reach patient via phone.  Will add substance abuse resources to the AVS.  ? ?Expected Discharge Plan: Home/Self Care ?Barriers to Discharge: Continued Medical Work up ? ?Expected Discharge Plan and Services ?Expected Discharge Plan: Home/Self Care ?  ? ?Prior Living Arrangements/Services ?  ?Lives with:: Spouse ?Patient language and need for interpreter reviewed:: Yes ?       ?Need for Family Participation in Patient Care: Yes (Comment) ?Care giver support system in place?: Yes (comment) ?  ?Criminal Activity/Legal Involvement Pertinent to Current Situation/Hospitalization: No - Comment as needed ? ?Activities of Daily Living ?Home Assistive Devices/Equipment: None ?ADL Screening (condition at time of admission) ?Patient's cognitive ability adequate to safely complete daily activities?: No ?Is the patient deaf or have difficulty hearing?: No ?Does the patient have difficulty seeing, even when wearing glasses/contacts?: No ?Does the patient have difficulty concentrating, remembering, or making decisions?: Yes ?Patient able to express need for assistance with ADLs?: Yes ?Does the patient have difficulty dressing or bathing?: Yes ?Independently performs ADLs?: No ?Communication: Independent ?Dressing (OT): Needs assistance ?Is this a change from baseline?: Change from baseline, expected to last <3days ?Grooming: Independent ?Feeding: Independent ?Bathing: Needs assistance ?Is this a change from baseline?: Change from baseline, expected to last <3 days ?Toileting: Needs assistance ?Is this a change from baseline?: Change from  baseline, expected to last <3 days ?In/Out Bed: Needs assistance ?Is this a change from baseline?: Change from baseline, expected to last <3 days ?Walks in Home: Needs assistance ?Is this a change from baseline?: Change from baseline, expected to last <3 days ?Does the patient have difficulty walking or climbing stairs?: No ?Weakness of Legs: Both ?Weakness of Arms/Hands: Both ? ?Emotional Assessment ?  ?Alcohol / Substance Use: Alcohol Use ?Psych Involvement: No (comment) ? ?Admission diagnosis:  Seizure (HCC) [R56.9] ?Alcohol withdrawal with inpatient treatment [facility]) [F10.939] ?Patient Active Problem List  ? Diagnosis Date Noted  ? Hyponatremia 10/02/2021  ? Thrombocytopenia (HCC) 10/02/2021  ? AKI (acute kidney injury) (HCC) 10/02/2021  ? Alcohol withdrawal with inpatient treatment (HCC) 10/02/2021  ? Fever 10/02/2021  ? Alcohol use disorder, severe, dependence (HCC)   ? Ataxia 11/20/2018  ? HTN (hypertension) 11/20/2018  ? UGIB (upper gastrointestinal bleed) 08/13/2018  ? Intractable vomiting with nausea   ? Gastroesophageal reflux disease with esophagitis   ? GI bleed 07/23/2018  ? Upper GI bleeding 07/23/2018  ? Alcohol abuse 02/18/2017  ? Alcohol withdrawal seizure with complication (HCC) 02/16/2017  ? Hypokalemia 12/12/2016  ? Alcoholic hepatitis without ascites 12/12/2016  ? MDD (major depressive disorder) 12/12/2016  ? ?PCP:  Hoy Register, MD ?Pharmacy:   ?[facility] [id number] - [place], [place] - [address] ?[address] ?[place] [address] ?Phone: [phone] Fax: [phone] ? ?

## 2021-10-04 NOTE — Progress Notes (Addendum)
Per NT pt is very unstable on feet and tremors much worse. Central Telemetry called due to patient's activity causing increase in heart rate to up in the 140's. Will use BSC for safety.Melton Alar, RN  ?

## 2021-10-04 NOTE — Discharge Instructions (Signed)
?  Outpatient Substance Use Treatment Services  ? ?[facility]  ?[facility] ?[address] ?[place], [place] [address]  ?[phone] ?Private insurance, Medicare A&B, and GCCN  ? ?[facility]  ?[address]  ?[place], [place] [address] ?[phone] ?Medicaid, Self Pay  ? ?[facility]      ?[address]  ?[place], [place] ?[phone] ?Medicaid and Private Insurance, Self Pay  ? ?[facility] ?[address]  ?[place], [place]  ?[phone] ?Private Insurance, and Self Pay ? ?[facility]      ?[address]    ?[place], [place] [address]  ?[phone] or [phone] ?Private Insurance Only  ? ? ?[facility] ?[address]  ?[place], [place] [address] ?[phone] ?Medicaid, Medicare, Private Insurance ? ?[facility] ?[address]  ?[place], [place] [address] ?[phone] ?Services are free or reduced ? ?[facility]  ?[address] ?[phone]  ?Self Pay only, sliding scale ? ?[facility]  ?[address]  ?[place], [place] [address] ?[phone] ?([facility]) ?Self Pay, Medicaid Only  ? ?[facility] ?[address]  ?[place], [place] [address] ?[phone] ?Medicaid, Medicare, Private Insurance ? ? ? ? ?Residential Substance Use Treatment Services  ? ?[facility]  ?[address]  ?[place], [place] [address]  ?[phone] or [phone] ?Detox (Medicare, Medicaid, private insurance, and self pay)  ?Residential Rehab 14 days (Medicare, Medicaid, private insurance, and self pay)  ? ?[facility]  ?[address] [place], [place]  ?[phone]  ?Male and Male Detox (Self Pay and Medicaid limited availability)  ?Rehab only Male (Medicaid and self pay only)  ? ?[facility]      ?[address]  ?[place], [place] [address]  ?[phone] or [phone] ?Detox and Residential Treatment ?Private Insurance Only  ? ?[facility]  ?[address]  ?[place], [place] [address]  ?[phone]  ?Treatment Only, must make assessment appointment, and must be sober for assessment appointment.  ?Self Pay Only, Medicare A&B, [place] Medicaid, [place] only! ?*Transportation assistance offered from [employer] [address] ? ?[facility]     ?[address] ?[place], [place] [address] ?Walk in interviews M-Sat 8-4p ?No pending legal charges ?[phone] ? ? ? ? ?[facility]:  [facility] ?Referral  ?[address] ?[place], [place] ?[phone] ?(Self Pay, Medicaid) ? ?[facility] ?[address] ?[place], [place] [address] ?[phone] ?Detox and Residential Treatment ?Medicare and Private Insurance ? ?[facility] ?[address]  ?[place], [place] [address] ?28 Day Women's Facility: [phone] ?28 Day Men's Facility: [phone] ?Long-term Residential Program:  ?[phone] Males 25 and Over ?(No Insurance, upfront fee) ? ?[facility]  ?[address] ?[place], [place] [address] ?[phone] ?Private Insurance with [employer], Private Pay ? ?[facility] ?[address] ?[place],  [address] ?Local [phone] ?Private Insurance Only ? ?[facility] ?[address]  ?[place],  [address]  ?[phone] ?(Males, upfront fee) ? ?[facility] ?[address]  ?[place], [address] ?[phone] ?Private Insurance  ?

## 2021-10-04 NOTE — Progress Notes (Signed)
?  Progress Note ? ? ?Patient: Brett Molina [id number] DOB: 10-23-1990 DOA: 10/02/2021     2 ?DOS: the patient was seen and examined on 10/04/2021 ?  ?Brief hospital course: ?Coulton Schlink Tippin is a 31 y.o. male with medical history significant for alcohol use disorder with history of withdrawal seizures and delirium tremens who is admitted for alcohol withdrawal with seizure-like activity. ? ?Assessment and Plan: ?* Alcohol withdrawal seizure with complication (HCC) ?Patient admitted with active alcohol withdrawal.  Had reported seizure-like activity prior to hospital arrival.  Has history of same, previously on Keppra but has not been adherent. No hallucinations on exam. Tremors are improved ?-High risk for severe withdrawal.  Currently on progressive care unit.  Not requiring any drips. ?-Continue CIWA protocol and still requiring Ativan per CIWA protocol ?-Continue Librium taper ?-Continue Keppra ?-Continue seizure precautions ? ?Alcoholic hepatitis without ascites ?Improving AST, ALT, ALP and T. bili.  Suspect alcoholic hepatitis.  Patient denies any abdominal pain. ?-RUQ ultrasound shows hepatic steatosis ?-Continue supportive care and repeat labs in a.m. ? ?Hyponatremia ?Improved.  Na now 132. ?-Decrease IVF rate ? ?Fever ?Patient with fever 101 ?F on admission.  No obvious infectious etiology at this time.  Potentially secondary to seizure-like activity or alcohol withdrawal.  No abdominal pain on exam.  Does have mild leukopenia without neutropenia.  Follow blood cultures.  UA negative ? ?AKI (acute kidney injury) (HCC) ?Resolved ? ?Rhabdomyolysis ?CPK trending down.  Continue IV fluids as mentioned above.  Repeat CPK level in the morning ? ?Thrombocytopenia (HCC) ?Improved. Likely secondary to acute alcoholic hepatitis.  Denies any obvious bleeding.  Continue to monitor. Hold chemical DVT prophylaxis ? ? ?Subjective: Seen and examined this morning.  Tremors improved.  No hallucinations upon my exam.  Family voices transient hallucinations earlier this AM.  No seizure-like activity reported.  Tolerating p.o. ? ?Physical Exam: ?Vitals:  ? 10/04/21 0017 10/04/21 0402 10/04/21 0800 10/04/21 1000  ?BP: 135/84 135/87 (!) 143/95 (!) 145/98  ?Pulse: (!) 103 (!) 105 96 (!) 102  ?Resp: (!) 24 (!) 28    ?Temp: 99.2 ?F (37.3 ?C) 99 ?F (37.2 ?C) 99.5 ?F (37.5 ?C)   ?TempSrc: Oral Oral Oral   ?SpO2: 98% 100% 98%   ?Weight:      ?Height:      ? ?Constitutional: Resting in bed, no hallucinations ?ENMT: Mucous membranes are dry.  Bite mark lower lip. ?Neck: normal, supple, no masses. ?Respiratory: clear to auscultation bilaterally, no wheezing, no crackles. Normal respiratory effort. No accessory muscle use.  ?Cardiovascular: Tachycardic, no murmurs / rubs / gallops. No extremity edema. 2+ pedal pulses. ?Abdomen: no tenderness, no masses palpated. Bowel sounds positive.  ?Musculoskeletal: no clubbing / cyanosis. No joint deformity upper and lower extremities. Good ROM, no contractures. Normal muscle tone.  ?Skin: Diaphoretic.  No rashes, lesions, ulcers. No induration ?Neurologic: Mild tremors. Sensation intact. Strength 5/5 in all 4.  ?Psychiatric: Alert and oriented x 3. ? ?Data Reviewed: Labs, vital signs ? ?Family Communication: No family present at bedside ? ?Disposition: ?Status is: Inpatient ?Remains inpatient appropriate because: Still having active alcohol withdrawal ?Planned Discharge Destination: Home ?DVT prophylaxis: SCDs ? ?Time spent: 60 minutes ? ?Author: ?Verdia Kuba, DO ?10/04/2021 12:49 PM ? ?For on call review [email or web address]  ?

## 2021-10-04 NOTE — Progress Notes (Signed)
Patient having increase in tremors with movement in bed and respirations slightly elevated. Per CIWA scale, prn given.Melton Alar, RN  ?

## 2021-10-05 LAB — CBC WITH DIFFERENTIAL/PLATELET
Abs Immature Granulocytes: 0.03 10*3/uL (ref 0.00–0.07)
Basophils Absolute: 0 10*3/uL (ref 0.0–0.1)
Basophils Relative: 0 %
Eosinophils Absolute: 0 10*3/uL (ref 0.0–0.5)
Eosinophils Relative: 1 %
HCT: 41.1 % (ref 39.0–52.0)
Hemoglobin: 13.6 g/dL (ref 13.0–17.0)
Immature Granulocytes: 1 %
Lymphocytes Relative: 25 %
Lymphs Abs: 0.8 10*3/uL (ref 0.7–4.0)
MCH: 31.6 pg (ref 26.0–34.0)
MCHC: 33.1 g/dL (ref 30.0–36.0)
MCV: 95.4 fL (ref 80.0–100.0)
Monocytes Absolute: 0.7 10*3/uL (ref 0.1–1.0)
Monocytes Relative: 22 %
Neutro Abs: 1.6 10*3/uL — ABNORMAL LOW (ref 1.7–7.7)
Neutrophils Relative %: 51 %
Platelets: 138 10*3/uL — ABNORMAL LOW (ref 150–400)
RBC: 4.31 MIL/uL (ref 4.22–5.81)
RDW: 17.9 % — ABNORMAL HIGH (ref 11.5–15.5)
WBC: 3.2 10*3/uL — ABNORMAL LOW (ref 4.0–10.5)
nRBC: 0 % (ref 0.0–0.2)

## 2021-10-05 LAB — COMPREHENSIVE METABOLIC PANEL
ALT: 392 U/L — ABNORMAL HIGH (ref 0–44)
AST: 468 U/L — ABNORMAL HIGH (ref 15–41)
Albumin: 3.5 g/dL (ref 3.5–5.0)
Alkaline Phosphatase: 151 U/L — ABNORMAL HIGH (ref 38–126)
Anion gap: 11 (ref 5–15)
BUN: <5 mg/dL — ABNORMAL LOW (ref 6–20)
CO2: 20 mmol/L — ABNORMAL LOW (ref 22–32)
Calcium: 8.7 mg/dL — ABNORMAL LOW (ref 8.9–10.3)
Chloride: 104 mmol/L (ref 98–111)
Creatinine, Ser: 0.84 mg/dL (ref 0.61–1.24)
GFR, Estimated: >60 mL/min (ref >60)
Glucose, Bld: 94 mg/dL (ref 70–99)
Potassium: 3.7 mmol/L (ref 3.5–5.1)
Sodium: 135 mmol/L (ref 135–145)
Total Bilirubin: 1.9 mg/dL — ABNORMAL HIGH (ref 0.3–1.2)
Total Protein: 6.8 g/dL (ref 6.5–8.1)

## 2021-10-05 LAB — CK: Total CK: 5148 U/L — ABNORMAL HIGH (ref 49–397)

## 2021-10-05 NOTE — Progress Notes (Signed)
?  Progress Note ? ? ?Patient: Brett Molina [id number]:[id number] DOB: 05-Jun-1991 DOA: 10/02/2021     3 ?DOS: the patient was seen and examined on 10/05/2021 ?  ?Brief hospital course: ?Brett Molina is a 31 y.o. male with medical history significant for alcohol use disorder with history of withdrawal seizures and delirium tremens who is admitted for alcohol withdrawal with seizure-like activity. ? ?Assessment and Plan: ?* Alcohol withdrawal seizure with complication (HCC) ?Patient admitted with active alcohol withdrawal.  Had reported seizure-like activity prior to hospital arrival.  Has history of same, previously on Keppra but has not been adherent. No hallucinations on exam. Tremors are improved ?-High risk for severe withdrawal.  Currently on progressive care unit.  Not requiring any drips. ?-Continue CIWA protocol and still requiring Ativan per CIWA protocol ?-Continue Librium taper ?-Continue Keppra ?-Continue seizure precautions ? ?Alcoholic hepatitis without ascites ?Improving AST, ALT, ALP and T. bili.  Suspect alcoholic hepatitis.  Patient denies any abdominal pain. ?-RUQ ultrasound shows hepatic steatosis ?-Continue supportive care and repeat labs in a.m. ? ?Hyponatremia ?Resolved ? ?Fever ?Patient with fever 101 ?F on admission.  No obvious infectious etiology at this time.  Potentially secondary to seizure-like activity or alcohol withdrawal.  No abdominal pain on exam. Blood cultures negative.  UA negative ? ?AKI (acute kidney injury) (HCC) ?Resolved ? ?Rhabdomyolysis ?CPK trending down.  Continue IV fluids as mentioned above.  Repeat CPK level in the morning ? ?Thrombocytopenia (HCC) ?Improved. Likely secondary to acute alcoholic hepatitis.  Denies any obvious bleeding.  Continue to monitor. Hold chemical DVT prophylaxis ? ? ?Subjective: Seen and examined this morning.  Tremors improved.  No hallucinations upon my exam. Wife voices transient hallucinations earlier this still.  No seizure-like  activity reported.  Tolerating p.o. ? ?Physical Exam: ?Vitals:  ? 10/04/21 2337 10/05/21 0414 10/05/21 0800 10/05/21 1100  ?BP: (!) 155/98 (!) 166/100    ?Pulse: (!) 110 98 94 (!) 109  ?Resp: (!) 22 (!) 22    ?Temp: 99.5 ?F (37.5 ?C) 98.9 ?F (37.2 ?C)    ?TempSrc: Oral     ?SpO2: 100% 100%    ?Weight:      ?Height:      ? ?Constitutional: Resting in bed, no hallucinations ?ENMT: Mucous membranes are dry.  Bite mark lower lip. ?Neck: normal, supple, no masses. ?Respiratory: clear to auscultation bilaterally, no wheezing, no crackles. Normal respiratory effort. No accessory muscle use.  ?Cardiovascular: Tachycardic, no murmurs / rubs / gallops. No extremity edema. 2+ pedal pulses. ?Abdomen: no tenderness, no masses palpated. Bowel sounds positive.  ?Musculoskeletal: no clubbing / cyanosis. No joint deformity upper and lower extremities. Good ROM, no contractures. Normal muscle tone.  ?Skin: Diaphoretic.  No rashes, lesions, ulcers. No induration ?Neurologic: Mild tremors. Sensation intact. Strength 5/5 in all 4.  ?Psychiatric: Alert and oriented x 3. ? ?Data Reviewed: Labs, vital signs ? ?Family Communication: No family present at bedside ? ?Disposition: ?Status is: Inpatient ?Remains inpatient appropriate because: Still having active alcohol withdrawal and requiring Ativan ?Planned Discharge Destination: Home ?DVT prophylaxis: SCDs ? ?Time spent: 60 minutes ? ?Author: ?Verdia Kuba, DO ?10/05/2021 12:09 PM ? ?For on call review [email or web address]  ?

## 2021-10-05 NOTE — Plan of Care (Signed)
  Problem: Clinical Measurements: Goal: Diagnostic test results will improve Outcome: Progressing   

## 2021-10-06 LAB — COMPREHENSIVE METABOLIC PANEL
ALT: 296 U/L — ABNORMAL HIGH (ref 0–44)
AST: 277 U/L — ABNORMAL HIGH (ref 15–41)
Albumin: 3.7 g/dL (ref 3.5–5.0)
Alkaline Phosphatase: 163 U/L — ABNORMAL HIGH (ref 38–126)
Anion gap: 10 (ref 5–15)
BUN: <5 mg/dL — ABNORMAL LOW (ref 6–20)
CO2: 20 mmol/L — ABNORMAL LOW (ref 22–32)
Calcium: 8.6 mg/dL — ABNORMAL LOW (ref 8.9–10.3)
Chloride: 106 mmol/L (ref 98–111)
Creatinine, Ser: 0.71 mg/dL (ref 0.61–1.24)
GFR, Estimated: >60 mL/min (ref >60)
Glucose, Bld: 96 mg/dL (ref 70–99)
Potassium: 3.4 mmol/L — ABNORMAL LOW (ref 3.5–5.1)
Sodium: 136 mmol/L (ref 135–145)
Total Bilirubin: 1.4 mg/dL — ABNORMAL HIGH (ref 0.3–1.2)
Total Protein: 7 g/dL (ref 6.5–8.1)

## 2021-10-06 LAB — CBC WITH DIFFERENTIAL/PLATELET
Abs Immature Granulocytes: 0.04 10*3/uL (ref 0.00–0.07)
Basophils Absolute: 0 10*3/uL (ref 0.0–0.1)
Basophils Relative: 1 %
Eosinophils Absolute: 0.1 10*3/uL (ref 0.0–0.5)
Eosinophils Relative: 1 %
HCT: 38.7 % — ABNORMAL LOW (ref 39.0–52.0)
Hemoglobin: 13.2 g/dL (ref 13.0–17.0)
Immature Granulocytes: 1 %
Lymphocytes Relative: 22 %
Lymphs Abs: 0.8 10*3/uL (ref 0.7–4.0)
MCH: 32 pg (ref 26.0–34.0)
MCHC: 34.1 g/dL (ref 30.0–36.0)
MCV: 93.7 fL (ref 80.0–100.0)
Monocytes Absolute: 1 10*3/uL (ref 0.1–1.0)
Monocytes Relative: 27 %
Neutro Abs: 1.9 10*3/uL (ref 1.7–7.7)
Neutrophils Relative %: 48 %
Platelets: 221 10*3/uL (ref 150–400)
RBC: 4.13 MIL/uL — ABNORMAL LOW (ref 4.22–5.81)
RDW: 17.8 % — ABNORMAL HIGH (ref 11.5–15.5)
WBC: 3.9 10*3/uL — ABNORMAL LOW (ref 4.0–10.5)
nRBC: 0 % (ref 0.0–0.2)

## 2021-10-06 LAB — CK: Total CK: 6112 U/L — ABNORMAL HIGH (ref 49–397)

## 2021-10-06 MED ORDER — CHLORDIAZEPOXIDE HCL 25 MG PO CAPS
25.0000 mg | ORAL_CAPSULE | Freq: Every day | ORAL | Status: DC
  Administered 2021-10-07 – 2021-10-08 (×2): 25 mg via ORAL
  Filled 2021-10-06: qty 1
  Filled 2021-10-06: qty 5

## 2021-10-06 NOTE — Progress Notes (Addendum)
?  Progress Note ? ? ?Patient: Brett Molina [id number]:[id number] DOB: 1991-07-03 DOA: 10/02/2021     4 ?DOS: the patient was seen and examined on 10/06/2021 ?  ?Brief hospital course: ?Ferrell Claiborne Niswander is a 31 y.o. male with medical history significant for alcohol use disorder with history of withdrawal seizures and delirium tremens who is admitted for alcohol withdrawal with seizure-like activity. ? ?Assessment and Plan: ? ?Alcohol withdrawal seizure with complication (HCC) ?Patient admitted with active alcohol withdrawal.  Had reported seizure-like activity prior to hospital arrival.  Has history of same, previously on Keppra but has not been adherent. No hallucinations. No tremors. Continue CIWA protocol. Has not required any Ativan per CIWA protocol overnight. Continue librium. Continue Keppra. Continue seizure precautions. Continue thiamine, MV, folic acid. ? ?Alcoholic hepatitis without ascites ?Improving AST, ALT, ALP and T. bili.  Suspect alcoholic hepatitis.  Patient denies any abdominal pain. RUQ ultrasound showed hepatic steatosis. No biliary obstruction. Continue supportive care and repeat labs in AM. Will mobilize him today therefore PT/OT ordered. Hepatitis serologies negative. ? ?Hyponatremia ?Resolved ? ?Fever, non-infectious ?Resolved. Patient with fever 101 ?F on admission.  No obvious infectious etiology at this time.  Potentially secondary to seizure-like activity or alcohol withdrawal.  No abdominal pain on exam. Blood cultures negative.  UA negative ? ?AKI (acute kidney injury) (HCC) ?Resolved ? ?Rhabdomyolysis ?CPK trending up.  Continue IV fluids as mentioned above.  Repeat CPK level in the morning ? ?Thrombocytopenia (HCC) ?Resolved ? ?Leukopenia ?No infectious etiology. Likely 2/2 ETOH abuse. ? ?Subjective: Seen and examined this morning.  No tremors. Per wife no longer having occasional episodes of hallucinations.  No seizure-like activity reported.  Tolerating oral diet. ? ?Physical  Exam: ?Vitals:  ? 10/05/21 2044 10/05/21 2337 10/06/21 0312 10/06/21 1158  ?BP: (!) 141/93 (!) 143/101 (!) 147/91 (!) 144/99  ?Pulse: 90 88 91 91  ?Resp: (!) 22 20 (!) 22 18  ?Temp: 99.5 ?F (37.5 ?C) 99.6 ?F (37.6 ?C) 98.9 ?F (37.2 ?C) 98.8 ?F (37.1 ?C)  ?TempSrc: Oral Oral Oral Oral  ?SpO2: 99% 100% 100% 100%  ?Weight:      ?Height:      ? ?Constitutional: Resting in bed, no hallucinations ?ENMT: Mucous membranes are dry.  Bite mark lower lip. ?Neck: normal, supple, no masses. ?Respiratory: clear to auscultation bilaterally, no wheezing, no crackles. Normal respiratory effort. No accessory muscle use.  ?Cardiovascular: Tachycardic, no murmurs / rubs / gallops. No extremity edema. 2+ pedal pulses. ?Abdomen: no tenderness, no masses palpated. Bowel sounds positive.  ?Musculoskeletal: no clubbing / cyanosis. No joint deformity upper and lower extremities. Good ROM, no contractures. Normal muscle tone.  ?Skin: Diaphoretic.  No rashes, lesions, ulcers. No induration ?Neurologic: No tremors. Sensation intact. Strength 5/5 in all 4.  ?Psychiatric: Alert and oriented x 3. ? ?Data Reviewed: Labs, vital signs ? ?Family Communication: Wife present at bedside ? ?Disposition: ?Status is: Inpatient ?Remains inpatient appropriate because: No longer requiring Ativan per CIWA. CPK trending up and requiring IV fluids ?Planned Discharge Destination: Home ?DVT prophylaxis: SCDs ? ?Time spent: 60 minutes ? ?Author: ?Verdia Kuba, DO ?10/06/2021 1:49 PM ? ?For on call review [email or web address]  ?

## 2021-10-06 NOTE — Plan of Care (Signed)

## 2021-10-07 DIAGNOSIS — E669 Obesity, unspecified: Secondary | ICD-10-CM

## 2021-10-07 DIAGNOSIS — R509 Fever, unspecified: Secondary | ICD-10-CM

## 2021-10-07 DIAGNOSIS — I10 Essential (primary) hypertension: Secondary | ICD-10-CM

## 2021-10-07 LAB — COMPREHENSIVE METABOLIC PANEL
ALT: 227 U/L — ABNORMAL HIGH (ref 0–44)
AST: 197 U/L — ABNORMAL HIGH (ref 15–41)
Albumin: 3.2 g/dL — ABNORMAL LOW (ref 3.5–5.0)
Alkaline Phosphatase: 145 U/L — ABNORMAL HIGH (ref 38–126)
Anion gap: 6 (ref 5–15)
BUN: 6 mg/dL (ref 6–20)
CO2: 19 mmol/L — ABNORMAL LOW (ref 22–32)
Calcium: 8.4 mg/dL — ABNORMAL LOW (ref 8.9–10.3)
Chloride: 112 mmol/L — ABNORMAL HIGH (ref 98–111)
Creatinine, Ser: 0.76 mg/dL (ref 0.61–1.24)
GFR, Estimated: >60 mL/min (ref >60)
Glucose, Bld: 115 mg/dL — ABNORMAL HIGH (ref 70–99)
Potassium: 5.1 mmol/L (ref 3.5–5.1)
Sodium: 137 mmol/L (ref 135–145)
Total Bilirubin: 2.3 mg/dL — ABNORMAL HIGH (ref 0.3–1.2)
Total Protein: 6.2 g/dL — ABNORMAL LOW (ref 6.5–8.1)

## 2021-10-07 LAB — CBC WITH DIFFERENTIAL/PLATELET
Abs Immature Granulocytes: 0.07 10*3/uL (ref 0.00–0.07)
Basophils Absolute: 0 10*3/uL (ref 0.0–0.1)
Basophils Relative: 1 %
Eosinophils Absolute: 0 10*3/uL (ref 0.0–0.5)
Eosinophils Relative: 1 %
HCT: 37.5 % — ABNORMAL LOW (ref 39.0–52.0)
Hemoglobin: 12.5 g/dL — ABNORMAL LOW (ref 13.0–17.0)
Immature Granulocytes: 1 %
Lymphocytes Relative: 22 %
Lymphs Abs: 1.2 10*3/uL (ref 0.7–4.0)
MCH: 31.7 pg (ref 26.0–34.0)
MCHC: 33.3 g/dL (ref 30.0–36.0)
MCV: 95.2 fL (ref 80.0–100.0)
Monocytes Absolute: 1.6 10*3/uL — ABNORMAL HIGH (ref 0.1–1.0)
Monocytes Relative: 28 %
Neutro Abs: 2.8 10*3/uL (ref 1.7–7.7)
Neutrophils Relative %: 47 %
Platelets: 294 10*3/uL (ref 150–400)
RBC: 3.94 MIL/uL — ABNORMAL LOW (ref 4.22–5.81)
RDW: 17.8 % — ABNORMAL HIGH (ref 11.5–15.5)
WBC: 5.7 10*3/uL (ref 4.0–10.5)
nRBC: 0 % (ref 0.0–0.2)

## 2021-10-07 LAB — CULTURE, BLOOD (ROUTINE X 2)
Culture: NO GROWTH
Culture: NO GROWTH
Special Requests: ADEQUATE

## 2021-10-07 LAB — CK: Total CK: 4775 U/L — ABNORMAL HIGH (ref 49–397)

## 2021-10-07 MED ORDER — SENNOSIDES-DOCUSATE SODIUM 8.6-50 MG PO TABS
1.0000 | ORAL_TABLET | Freq: Two times a day (BID) | ORAL | Status: DC | PRN
  Administered 2021-10-08: 1 via ORAL
  Filled 2021-10-07: qty 1

## 2021-10-07 MED ORDER — POLYETHYLENE GLYCOL 3350 17 G PO PACK
17.0000 g | PACK | Freq: Two times a day (BID) | ORAL | Status: DC | PRN
  Filled 2021-10-07: qty 1

## 2021-10-07 NOTE — Progress Notes (Signed)
Occupational Therapy Evaluation ?Patient Details ?Name: Brett Molina ?MRN: [id number] ?DOB: [birth year] ?Today's Date: 10/07/2021 ? ? ?History of Present Illness Patient is a 31 year old male 31 y.o. male with medical history significant for alcohol use disorder with history of withdrawal seizures and delirium tremens who is admitted for alcohol withdrawal with seizure-like activity.  ? ?Clinical Impression ?  ?Patient lives in an apartment with his spouse and is independent at baseline. Patient reports he likes to watch sports in his free time. Currently patient is min A with stand pivot to transfer to recliner chair with global tremors. Patient reports he has not been up for ~5 days. Anticipate patient will progress quickly as medical issues continue to resolve and acute therapy services. Do not anticipate need for therapy at D/C.   ?   ? ?Recommendations for follow up therapy are one component of a multi-disciplinary discharge planning process, led by the attending physician.  Recommendations may be updated based on patient status, additional functional criteria and insurance authorization.  ? ?Follow Up Recommendations ? No OT follow up  ?  ?Assistance Recommended at Discharge PRN  ?Patient can return home with the following A little help with walking and/or transfers;A little help with bathing/dressing/bathroom ? ?  ?Functional Status Assessment ? Patient has had a recent decline in their functional status and demonstrates the ability to make significant improvements in function in a reasonable and predictable amount of time.  ?Equipment Recommendations ? None recommended by OT  ?  ?   ?Precautions / Restrictions Precautions ?Precautions: Fall ?Restrictions ?Weight Bearing Restrictions: No  ? ?  ? ?Mobility Bed Mobility ?Overal bed mobility: Modified Independent ?  ?  ?  ?  ?  ?  ?  ?  ? ? ? ?  ?Balance Overall balance assessment: Needs assistance ?Sitting-balance support: Feet supported ?Sitting  balance-Leahy Scale: Good ?  ?  ?Standing balance support: Single extremity supported ?Standing balance-Leahy Scale: Poor ?  ?  ?  ?  ?  ?  ?  ?  ?  ?  ?  ?  ?   ? ?ADL either performed or assessed with clinical judgement  ? ?ADL Overall ADL's : Needs assistance/impaired ?Eating/Feeding: Independent;Sitting ?  ?Grooming: Set up;Sitting ?  ?Upper Body Bathing: Set up;Sitting ?  ?Lower Body Bathing: Sit to/from stand;Minimal assistance ?Lower Body Bathing Details (indicate cue type and reason): Due to limited standing balance ?Upper Body Dressing : Set up;Sitting ?  ?Lower Body Dressing: Minimal assistance;Sitting/lateral leans;Sit to/from stand ?Lower Body Dressing Details (indicate cue type and reason): due to poor standing balance ?Toilet Transfer: Minimal assistance;Stand-pivot;Cueing for safety ?Toilet Transfer Details (indicate cue type and reason): Patient is able to pivot from bed to chair however is unsteady/tremulous globally ?Toileting- Clothing Manipulation and Hygiene: Minimal assistance;Sitting/lateral lean;Sit to/from stand ?  ?  ?  ?Functional mobility during ADLs: Minimal assistance ?General ADL Comments: Patient presents with global weakness and tremors impacting overall safety with mobility and self care. Anticipate he will progress well  ? ? ? ? ?Pertinent Vitals/Pain Pain Assessment ?Pain Assessment: No/denies pain  ? ? ? ?   ?Extremity/Trunk Assessment Upper Extremity Assessment ?Upper Extremity Assessment: Overall WFL for tasks assessed ?  ?Lower Extremity Assessment ?Lower Extremity Assessment: Defer to PT evaluation ?  ?Cervical / Trunk Assessment ?Cervical / Trunk Assessment: Normal ?  ?Communication Communication ?Communication: No difficulties ?  ?Cognition Arousal/Alertness: Awake/alert ?Behavior During Therapy: [facility] for tasks assessed/performed ?Overall Cognitive Status: Within Functional Limits  for tasks assessed ?  ?  ?  ?  ?  ?  ?  ?  ?  ?  ?  ?  ?  ?  ?  ?  ?  ?  ?  ?   ?   ?    ? ? ?Home Living Family/patient expects to be discharged to:: Private residence ?Living Arrangements: Spouse/significant other ?Available Help at Discharge: Family ?Type of Home: Apartment ?Home Access: Stairs to enter ?Entrance Stairs-Number of Steps: 2 flights "its the second floor" ?  ?Home Layout: One level ?  ?  ?  ?  ?  ?  ?  ?  ?  ?  ?  ? ?  ?Prior Functioning/Environment Prior Level of Function : Independent/Modified Independent ?  ?  ?  ?  ?  ?  ?  ?  ?  ? ?  ?  ?OT Problem List: Decreased activity tolerance;Impaired balance (sitting and/or standing);Decreased safety awareness ?  ?   ?OT Treatment/Interventions: Self-care/ADL training;Balance training;Patient/family education;Therapeutic activities  ?  ?OT Goals(Current goals can be found in the care plan section) Acute Rehab OT Goals ?Patient Stated Goal: Get stronger ?OT Goal Formulation: With patient ?Time For Goal Achievement: 10/21/21 ?Potential to Achieve Goals: Good  ?OT Frequency: Min 2X/week ?  ? ?   ?AM-PAC OT "6 Clicks" Daily Activity     ?Outcome Measure Help from another person eating meals?: None ?Help from another person taking care of personal grooming?: A Little ?Help from another person toileting, which includes using toliet, bedpan, or urinal?: A Little ?Help from another person bathing (including washing, rinsing, drying)?: A Little ?Help from another person to put on and taking off regular upper body clothing?: A Little ?Help from another person to put on and taking off regular lower body clothing?: A Little ?6 Click Score: 19 ?  ?End of Session Nurse Communication: Mobility status ? ?Activity Tolerance: Patient tolerated treatment well ?Patient left: in chair;with call bell/phone within reach;with chair alarm set ? ?OT Visit Diagnosis: Unsteadiness on feet (R26.81)  ?              ?Time: 9211-9417 ?OT Time Calculation (min): 14 min ?Charges:  OT General Charges ?$OT Visit: 1 Visit ?OT Evaluation ?$OT Eval Low Complexity: 1  Low ? ?Marlyce Huge OT ?OT pager: [phone] ? ? ?Carmelia Roller ?10/07/2021, 12:12 PM ?

## 2021-10-07 NOTE — Progress Notes (Signed)
?   10/07/21 1027  ?Assess: MEWS Score  ?Temp 99.4 ?F (37.4 ?C)  ?BP 112/61  ?Pulse Rate (!) 110  ?Resp (!) 24  ?Level of Consciousness Alert  ?SpO2 100 %  ?O2 Device Room Air  ?Assess: MEWS Score  ?MEWS Temp 0  ?MEWS Systolic 0  ?MEWS Pulse 1  ?MEWS RR 1  ?MEWS LOC 0  ?MEWS Score 2  ?MEWS Score Color Yellow  ?Assess: if the MEWS score is Yellow or Red  ?Were vital signs taken at a resting state? Yes  ?Focused Assessment No change from prior assessment  ?Does the patient meet 2 or more of the SIRS criteria? No  ?MEWS guidelines implemented *See Row Information* Yes  ?Treat  ?MEWS Interventions Escalated (See documentation below)  ?Pain Scale 0-10  ?Pain Score 0  ?Take Vital Signs  ?Increase Vital Sign Frequency  Yellow: Q 2hr X 2 then Q 4hr X 2, if remains yellow, continue Q 4hrs  ?Escalate  ?MEWS: Escalate Yellow: discuss with charge nurse/RN and consider discussing with provider and RRT  ?Notify: Charge Nurse/RN  ?Name of Charge Nurse/RN Notified Delford Field, RN  ?Date Charge Nurse/RN Notified 10/07/21  ?Time Charge Nurse/RN Notified 1031  ?Notify: Provider  ?Provider Name/Title Candelaria Stagers, MD  ?Date Provider Notified 10/07/21  ?Time Provider Notified 1031  ?Notification Type  ?(secure chat)  ?Assess: SIRS CRITERIA  ?SIRS Temperature  0  ?SIRS Pulse 1  ?SIRS Respirations  1  ?SIRS WBC 0  ?SIRS Score Sum  2  ? ? ?

## 2021-10-07 NOTE — Assessment & Plan Note (Signed)
Continue home amlodipine and lisinopril. ?

## 2021-10-07 NOTE — Progress Notes (Addendum)
?PROGRESS NOTE ? ?Brett Molina EHU:[id number] DOB: Jan 20, 1991  ? ?PCP: Charlott Rakes, MD ? ?Patient is from: Home ? ?DOA: 10/02/2021 LOS: 5 ? ?Chief complaints ?Chief Complaint  ?Patient presents with  ? Seizures  ?  ? ?Brief Narrative / Interim history: ?31 year old M with PMH of EtOH dependence, withdrawal seizures and DTs who is admitted for alcohol withdrawal with seizure-like activity, elevated LFT, rhabdomyolysis, pancytopenia and fever.  He was started on IV fluid, Librium taper and CIWA with as needed Ativan.  Fever felt to be due to seizure.  Infectious work-up unrevealing.  Pancytopenia resolved except for mild anemia.  LFT improved.  Remains on IV fluid for rhabdomyolysis.  CK peaked at 6000, now downtrending.  ? ?Subjective: ?Seen and examined earlier this morning.  No major events overnight of this morning.  No complaints.  He denies pain, shortness of breath, nausea, vomiting, diarrhea or UTI symptoms.  Eager to go home but understands the need to stay in the hospital for IV fluid for his rhabdomyolysis. ? ?Objective: ?Vitals:  ? 10/07/21 1006 10/07/21 1027 10/07/21 1202 10/07/21 1514  ?BP: 117/64 112/61 (!) 141/90 123/89  ?Pulse: (!) 109 (!) 110 90 (!) 101  ?Resp:  (!) 24 (!) 22 18  ?Temp:  99.4 ?F (37.4 ?C) 98.9 ?F (37.2 ?C) 99.2 ?F (37.3 ?C)  ?TempSrc:  Oral Oral Oral  ?SpO2:  100% 99% 100%  ?Weight:      ?Height:      ? ? ?Examination: ? ?GENERAL: No apparent distress.  Nontoxic. ?HEENT: MMM.  Vision and hearing grossly intact.  ?NECK: Supple.  No apparent JVD.  ?RESP:  No IWOB.  Fair aeration bilaterally. ?CVS:  RRR. Heart sounds normal.  ?ABD/GI/GU: BS+. Abd soft, NTND.  ?MSK/EXT:  Moves extremities. No apparent deformity. No edema.  ?SKIN: no apparent skin lesion or wound ?NEURO: Awake, alert and oriented appropriately.  No apparent focal neuro deficit. ?PSYCH: Calm. Normal affect.  ? ?Procedures:  ?None ? ?Microbiology summarized: ?COVID-19 and influenza PCR nonreactive. ?MRSA PCR screen  negative.   ?Blood cultures NGTD. ? ?Assessment and Plan: ?* Alcohol withdrawal seizure with complication ([place]) ?Patient admitted with active alcohol withdrawal with seizure-like activity prior to arrival. ?-Remains stable.  Completing Librium taper. ?-Continue CIWA with as needed Ativan ?-Continue thiamine, folic acid and multivitamin ?-Continue home Keppra ?-Continue seizure precaution ?-De-escalate care to telemetry ? ?Alcoholic hepatitis without ascites ?AST 1718, ALT 865, alk phos 145, T. bili 3.1 on admission.  Likely due to EtOH and rhabdo.  No GI symptoms.  RUQ US shows hepatic steatosis.  Improved. ?-Continue IV fluid for rhabdo ?-Continue monitoring LFTs ?-Encouraged alcohol cessation and moderation. ?-TOC for resources and counseling. ? ?Hyponatremia ?Sodium 121 on admission.  Likely dehydration and beer potomania.  Resolved. ?-Continue IV NS negative for rhabdo. ? ?Fever ?Febrile to 101 on admission.  Has not had further fever.  No clear source of infection.  Infectious work-up unrevealing.  Leukopenia resolved. ? ?AKI (acute kidney injury) ([place]) ?Recent Labs  ?  01/27/21 ?0333 06/19/21 ?1052 10/02/21 ?1532 10/03/21 ?0251 10/03/21 ?7858 10/03/21 ?1510 10/04/21 ?8502 10/05/21 ?7741 10/06/21 ?1017 10/07/21 ?0324  ?BUN 7 5* _0 <5* <5* 6  ?CREATININE 0.82 0.89 1.29* 1.10 0.89 0.87 0.88 0.84 0.71 0.76  ?Likely prerenal from dehydration and ATN from rhabdomyolysis.  Resolved. ? ?Obesity (BMI 30-39.9) ?Body mass index is 31.25 kg/m?. ?-Encourage lifestyle change to lose weight. ? ?Rhabdomyolysis ?CK peaked at 6100.  Likely from EtOH and  seizure.  Trending down. ?-Continue IV fluid ? ?Pancytopenia ([place]) ?Likely from alcohol.  Leukopenia thrombocytopenia improved.  Mild anemia stable. ?-Check anemia panel ? ?HTN (hypertension) ?Continue home amlodipine and lisinopril. ? ? ? ?DVT prophylaxis:  ?SCDs Start: 10/02/21 1939 ? ?Code Status: Full code ?Family Communication: Patient and/or RN. Available if any  question.  ?Level of care: Telemetry ?Status is: Inpatient ?Remains inpatient appropriate because: Nontraumatic rhabdomyolysis requiring IV fluid ? ? ?Final disposition: Likely home once medically stable. ? ?Consultants:  ?None ? ?Sch Meds:  ?Scheduled Meds: ? amLODipine  10 mg Oral Daily  ? chlordiazePOXIDE  25 mg Oral Daily  ? folic acid  1 mg Oral Daily  ? levETIRAcetam  500 mg Oral BID  ? lisinopril  20 mg Oral Daily  ? multivitamin with minerals  1 tablet Oral Daily  ? sodium chloride flush  3 mL Intravenous Q12H  ? thiamine  100 mg Oral Daily  ? Or  ? thiamine  100 mg Intravenous Daily  ? ?Continuous Infusions: ? sodium chloride 150 mL/hr at 10/07/21 1023  ? ?PRN Meds:.ibuprofen, ondansetron **OR** ondansetron (ZOFRAN) IV, polyethylene glycol, senna-docusate ? ?Antimicrobials: ?Anti-infectives (From admission, onward)  ? ? None  ? ?  ? ? ? ?I have personally reviewed the following labs and images: ?CBC: ?Recent Labs  ?Lab 10/02/21 ?1532 10/03/21 ?0251 10/04/21 ?8916 10/05/21 ?9450 10/06/21 ?3888 10/07/21 ?0324  ?WBC 2.9* 2.8* 2.4* 3.2* 3.9* 5.7  ?NEUTROABS 1.9  --  1.1* 1.6* 1.9 2.8  ?HGB 14.1 12.9* 13.3 13.6 13.2 12.5*  ?HCT 40.2 38.2* 39.4 41.1 38.7* 37.5*  ?MCV 91.2 95.7 94.5 95.4 93.7 95.2  ?PLT 82* 74* 100* 138* 221 294  ? ?BMP &GFR ?Recent Labs  ?Lab 10/03/21 ?0251 10/03/21 ?0929 10/03/21 ?1510 10/04/21 ?2800 10/05/21 ?3491 10/06/21 ?1017 10/07/21 ?0324  ?NA 127*   < > 130* 132* 135 136 137  ?K 3.5   < > 4.0 3.2* 3.7 3.4* 5.1  ?CL 95*   < > 98 101 104 106 112*  ?CO2 20*   < > 23 21* 20* 20* 19*  ?GLUCOSE 81   < > 92 96 94 96 115*  ?BUN 12   < > 8 7 <5* <5* 6  ?CREATININE 1.10   < > 0.87 0.88 0.84 0.71 0.76  ?CALCIUM 8.1*   < > 8.8* 8.5* 8.7* 8.6* 8.4*  ?MG 2.3  --   --  2.4  --   --   --   ?PHOS  --   --   --  3.2  --   --   --   ? < > = values in this interval not displayed.  ? ?Estimated Creatinine Clearance: 173.7 mL/min (by C-G formula based on SCr of 0.76 mg/dL). ?Liver & Pancreas: ?Recent Labs   ?Lab 10/03/21 ?0251 10/04/21 ?7915 10/05/21 ?0569 10/06/21 ?1017 10/07/21 ?0324  ?AST 1,125* 673* 468* 277* 197*  ?ALT 643* 467* 392* 296* 227*  ?ALKPHOS 117 146* 151* 163* 145*  ?BILITOT 2.9* 2.5* 1.9* 1.4* 2.3*  ?PROT 6.4* 7.0 6.8 7.0 6.2*  ?ALBUMIN 3.4* 3.7 3.5 3.7 3.2*  ? ?No results for input(s): LIPASE, AMYLASE in the last 168 hours. ?Recent Labs  ?Lab 10/02/21 ?1824  ?AMMONIA 39*  ? ?Diabetic: ?No results for input(s): HGBA1C in the last 72 hours. ?No results for input(s): GLUCAP in the last 168 hours. ?Cardiac Enzymes: ?Recent Labs  ?Lab 10/03/21 ?0251 10/04/21 ?7948 10/05/21 ?0165 10/06/21 ?5374 10/07/21 ?0324  ?CKTOTAL 3,586* 2,654* 5,148*  4,315* 4,008*  ? ?No results for input(s): PROBNP in the last 8760 hours. ?Coagulation Profile: ?Recent Labs  ?Lab 10/03/21 ?0251  ?INR 1.0  ? ?Thyroid Function Tests: ?No results for input(s): TSH, T4TOTAL, FREET4, T3FREE, THYROIDAB in the last 72 hours. ?Lipid Profile: ?No results for input(s): CHOL, HDL, LDLCALC, TRIG, CHOLHDL, LDLDIRECT in the last 72 hours. ?Anemia Panel: ?No results for input(s): VITAMINB12, FOLATE, FERRITIN, TIBC, IRON, RETICCTPCT in the last 72 hours. ?Urine analysis: ?   ?Component Value Date/Time  ? COLORURINE YELLOW 10/03/2021 1108  ? APPEARANCEUR CLEAR 10/03/2021 1108  ? LABSPEC 1.009 10/03/2021 1108  ? PHURINE 6.0 10/03/2021 1108  ? GLUCOSEU NEGATIVE 10/03/2021 1108  ? HGBUR MODERATE (A) 10/03/2021 1108  ? [place] NEGATIVE 10/03/2021 1108  ? KETONESUR 20 (A) 10/03/2021 1108  ? PROTEINUR 100 (A) 10/03/2021 1108  ? NITRITE NEGATIVE 10/03/2021 1108  ? LEUKOCYTESUR NEGATIVE 10/03/2021 1108  ? ?Sepsis Labs: ?Invalid input(s): PROCALCITONIN, LACTICIDVEN ? ?Microbiology: ?Recent Results (from the past 240 hour(s))  ?Culture, blood (Routine X 2) w Reflex to ID Panel     Status: None  ? Collection Time: 10/02/21  3:50 PM  ? Specimen: BLOOD  ?Result Value Ref Range Status  ? Specimen Description   Final  ?  BLOOD BLOOD LEFT HAND ?Performed at  [facility], [place] [address], [place], [place] [address] ?  ? Special Requests   Final  ?  BOTTLES DRAWN AEROBIC AND ANAEROBIC Blood Culture results may not be optimal due to an inadequate

## 2021-10-07 NOTE — Evaluation (Signed)
Physical Therapy Evaluation ?Patient Details ?Name: Brett Molina ?MRN: [id number] ?DOB: [birth year] ?Today's Date: 10/07/2021 ? ?History of Present Illness ? Patient is a 31 year old male who is admitted on 10/02/21 for alcohol withdrawal with seizure-like activity. Pt with medical history significant for alcohol use disorder with history of withdrawal seizures and delirium tremors.  ?Clinical Impression ? Pt admitted with above diagnosis. At baseline, pt is independent.  Today , pt was able to transfer with supervision and min guard to ambulate 200'.  His orthostatic blood pressures were normal ; HR 105 bpm rest and 155 bpm walking.   He did have mild ataxia and unsteadiness with gait but is expected to progress well with no PT needs at d/c. Pt currently with functional limitations due to the deficits listed below (see PT Problem List). Pt will benefit from skilled PT to increase their independence and safety with mobility to allow discharge to the venue listed below.   ?   ?   ? ?Recommendations for follow up therapy are one component of a multi-disciplinary discharge planning process, led by the attending physician.  Recommendations may be updated based on patient status, additional functional criteria and insurance authorization. ? ?Follow Up Recommendations No PT follow up ? ?  ?Assistance Recommended at Discharge PRN  ?Patient can return home with the following ? A little help with walking and/or transfers;Help with stairs or ramp for entrance ? ?  ?Equipment Recommendations None recommended by PT  ?Recommendations for Other Services ?    ?  ?Functional Status Assessment Patient has had a recent decline in their functional status and demonstrates the ability to make significant improvements in function in a reasonable and predictable amount of time.  ? ?  ?Precautions / Restrictions Precautions ?Precautions: Fall ?Restrictions ?Weight Bearing Restrictions: No  ? ?  ? ?Mobility ? Bed Mobility ?Overal bed  mobility: Modified Independent ?  ?  ?  ?  ?  ?  ?  ?  ? ?Transfers ?Overall transfer level: Needs assistance ?Equipment used: None ?Transfers: Sit to/from Stand ?Sit to Stand: Min guard ?  ?  ?  ?  ?  ?  ?  ? ?Ambulation/Gait ?Ambulation/Gait assistance: Min guard ?Gait Distance (Feet): 200 Feet ?Assistive device: None ?Gait Pattern/deviations: Ataxic ?Gait velocity: decreased ?  ?  ?General Gait Details: Very mild ataxia with foot flat gait; mild shakiness ? ?Stairs ?  ?  ?  ?  ?  ? ?Wheelchair Mobility ?  ? ?Modified Rankin (Stroke Patients Only) ?  ? ?  ? ?Balance Overall balance assessment: Needs assistance ?Sitting-balance support: Feet supported ?Sitting balance-Leahy Scale: Good ?  ?  ?Standing balance support: No upper extremity supported ?Standing balance-Leahy Scale: Fair ?  ?  ?  ?  ?  ?  ?  ?  ?  ?  ?  ?  ?   ? ? ? ?Pertinent Vitals/Pain Pain Assessment ?Pain Assessment: No/denies pain  ? ? ?Home Living Family/patient expects to be discharged to:: Private residence ?Living Arrangements: Spouse/significant other ?Available Help at Discharge: Family ?Type of Home: Apartment ?Home Access: Stairs to enter ?  ?Entrance Stairs-Number of Steps: 2 flights "its the second floor" ?  ?Home Layout: One level ?Home Equipment: None ?   ?  ?Prior Function Prior Level of Function : Independent/Modified Independent ?  ?  ?  ?  ?  ?  ?  ?  ?  ? ? ?Hand Dominance  ? Dominant Hand: Right ? ?  ?  Extremity/Trunk Assessment  ? Upper Extremity Assessment ?Upper Extremity Assessment: Overall WFL for tasks assessed ?  ? ?Lower Extremity Assessment ?Lower Extremity Assessment: Overall WFL for tasks assessed (ROM WFL; MMT 5/5; coordination WNL with heel/shin) ?  ? ?Cervical / Trunk Assessment ?Cervical / Trunk Assessment: Normal  ?Communication  ? Communication: No difficulties  ?Cognition Arousal/Alertness: Awake/alert ?Behavior During Therapy: [facility] for tasks assessed/performed ?Overall Cognitive Status: Within Functional Limits  for tasks assessed ?  ?  ?  ?  ?  ?  ?  ?  ?  ?  ?  ?  ?  ?  ?  ?  ?  ?  ?  ? ?  ?General Comments General comments (skin integrity, edema, etc.): Orthostatic BP were stable ? ?  ?Exercises    ? ?Assessment/Plan  ?  ?PT Assessment Patient needs continued PT services  ?PT Problem List Decreased balance;Decreased mobility ? ?   ?  ?PT Treatment Interventions Therapeutic activities;Gait training;Therapeutic exercise;Patient/family education;Stair training;Balance training;Functional mobility training   ? ?PT Goals (Current goals can be found in the Care Plan section)  ?Acute Rehab PT Goals ?Patient Stated Goal: return home ?PT Goal Formulation: With patient ?Time For Goal Achievement: 10/21/21 ?Potential to Achieve Goals: Good ? ?  ?Frequency Min 3X/week ?  ? ? ?Co-evaluation   ?  ?  ?  ?  ? ? ?  ?AM-PAC PT "6 Clicks" Mobility  ?Outcome Measure Help needed turning from your back to your side while in a flat bed without using bedrails?: None ?Help needed moving from lying on your back to sitting on the side of a flat bed without using bedrails?: None ?Help needed moving to and from a bed to a chair (including a wheelchair)?: A Little ?Help needed standing up from a chair using your arms (e.g., wheelchair or bedside chair)?: A Little ?Help needed to walk in hospital room?: A Little ?Help needed climbing 3-5 steps with a railing? : A Little ?6 Click Score: 20 ? ?  ?End of Session Equipment Utilized During Treatment: Gait belt ?Activity Tolerance: Patient tolerated treatment well ?Patient left: in bed;with call bell/phone within reach;with family/visitor present;with bed alarm set ?Nurse Communication: Mobility status ?PT Visit Diagnosis: Other abnormalities of gait and mobility (R26.89) ?  ? ?Time: [id number] ?PT Time Calculation (min) (ACUTE ONLY): 17 min ? ? ?Charges:   PT Evaluation ?$PT Eval Low Complexity: 1 Low ?  ?  ?   ? ? ?Abran Richard, PT ?Acute Rehab Services ?Pager [phone] ?[facility]  [phone] ? ? ?[name] [name] ?10/07/2021, 2:09 PM ? ?

## 2021-10-07 NOTE — Assessment & Plan Note (Signed)
Body mass index is 31.25 kg/m?. ?-Encourage lifestyle change to lose weight. ?

## 2021-10-07 NOTE — Assessment & Plan Note (Signed)
CK peaked at 6100.  Likely from EtOH and seizure.  Trending down. ?-Continue IV fluid ?

## 2021-10-08 DIAGNOSIS — N179 Acute kidney failure, unspecified: Secondary | ICD-10-CM

## 2021-10-08 DIAGNOSIS — M6282 Rhabdomyolysis: Secondary | ICD-10-CM

## 2021-10-08 DIAGNOSIS — D61818 Other pancytopenia: Secondary | ICD-10-CM

## 2021-10-08 DIAGNOSIS — E669 Obesity, unspecified: Secondary | ICD-10-CM

## 2021-10-08 DIAGNOSIS — K7010 Alcoholic hepatitis without ascites: Secondary | ICD-10-CM

## 2021-10-08 DIAGNOSIS — E871 Hypo-osmolality and hyponatremia: Secondary | ICD-10-CM

## 2021-10-08 LAB — COMPREHENSIVE METABOLIC PANEL
ALT: 172 U/L — ABNORMAL HIGH (ref 0–44)
AST: 126 U/L — ABNORMAL HIGH (ref 15–41)
Albumin: 3.3 g/dL — ABNORMAL LOW (ref 3.5–5.0)
Alkaline Phosphatase: 127 U/L — ABNORMAL HIGH (ref 38–126)
Anion gap: 9 (ref 5–15)
BUN: <5 mg/dL — ABNORMAL LOW (ref 6–20)
CO2: 21 mmol/L — ABNORMAL LOW (ref 22–32)
Calcium: 8.6 mg/dL — ABNORMAL LOW (ref 8.9–10.3)
Chloride: 111 mmol/L (ref 98–111)
Creatinine, Ser: 0.52 mg/dL — ABNORMAL LOW (ref 0.61–1.24)
GFR, Estimated: >60 mL/min (ref >60)
Glucose, Bld: 107 mg/dL — ABNORMAL HIGH (ref 70–99)
Potassium: 3.1 mmol/L — ABNORMAL LOW (ref 3.5–5.1)
Sodium: 141 mmol/L (ref 135–145)
Total Bilirubin: 1.1 mg/dL (ref 0.3–1.2)
Total Protein: 6.5 g/dL (ref 6.5–8.1)

## 2021-10-08 LAB — CBC
HCT: 36.8 % — ABNORMAL LOW (ref 39.0–52.0)
Hemoglobin: 12.2 g/dL — ABNORMAL LOW (ref 13.0–17.0)
MCH: 31.9 pg (ref 26.0–34.0)
MCHC: 33.2 g/dL (ref 30.0–36.0)
MCV: 96.1 fL (ref 80.0–100.0)
Platelets: 407 10*3/uL — ABNORMAL HIGH (ref 150–400)
RBC: 3.83 MIL/uL — ABNORMAL LOW (ref 4.22–5.81)
RDW: 18 % — ABNORMAL HIGH (ref 11.5–15.5)
WBC: 7.1 10*3/uL (ref 4.0–10.5)
nRBC: 0 % (ref 0.0–0.2)

## 2021-10-08 LAB — RETICULOCYTES
Immature Retic Fract: 25.3 % — ABNORMAL HIGH (ref 2.3–15.9)
RBC.: 3.8 MIL/uL — ABNORMAL LOW (ref 4.22–5.81)
Retic Count, Absolute: 38 10*3/uL (ref 19.0–186.0)
Retic Ct Pct: 1 % (ref 0.4–3.1)

## 2021-10-08 LAB — CK: Total CK: 2970 U/L — ABNORMAL HIGH (ref 49–397)

## 2021-10-08 LAB — IRON AND TIBC
Iron: 99 ug/dL (ref 45–182)
Saturation Ratios: 38 % (ref 17.9–39.5)
TIBC: 260 ug/dL (ref 250–450)
UIBC: 161 ug/dL

## 2021-10-08 LAB — FOLATE: Folate: 15.6 ng/mL (ref >5.9)

## 2021-10-08 LAB — PHOSPHORUS: Phosphorus: 4 mg/dL (ref 2.5–4.6)

## 2021-10-08 LAB — VITAMIN B12: Vitamin B-12: 800 pg/mL (ref 180–914)

## 2021-10-08 LAB — FERRITIN: Ferritin: 1155 ng/mL — ABNORMAL HIGH (ref 24–336)

## 2021-10-08 LAB — MAGNESIUM: Magnesium: 1.6 mg/dL — ABNORMAL LOW (ref 1.7–2.4)

## 2021-10-08 MED ORDER — LEVETIRACETAM 500 MG PO TABS: 500.0000 mg | ORAL_TABLET | Freq: Two times a day (BID) | ORAL | 0 refills | Status: DC

## 2021-10-08 MED ORDER — POTASSIUM CHLORIDE CRYS ER 20 MEQ PO TBCR
40.0000 meq | EXTENDED_RELEASE_TABLET | Freq: Once | ORAL | Status: AC
  Administered 2021-10-08: 40 meq via ORAL
  Filled 2021-10-08: qty 2

## 2021-10-08 MED ORDER — POTASSIUM CHLORIDE CRYS ER 20 MEQ PO TBCR: 40.0000 meq | EXTENDED_RELEASE_TABLET | Freq: Once | ORAL | Status: DC

## 2021-10-08 MED ORDER — AMLODIPINE BESYLATE 10 MG PO TABS: 10.0000 mg | ORAL_TABLET | Freq: Every day | ORAL | 0 refills | Status: DC

## 2021-10-08 MED ORDER — CHLORDIAZEPOXIDE HCL 10 MG PO CAPS
ORAL_CAPSULE | ORAL | 0 refills | Status: DC
Start: 2021-10-08 — End: 2021-12-01

## 2021-10-08 MED ORDER — ADULT MULTIVITAMIN W/MINERALS CH
1.0000 | ORAL_TABLET | Freq: Every day | ORAL | Status: DC
Start: 2021-10-09 — End: 2022-06-22

## 2021-10-08 MED ORDER — THIAMINE HCL 100 MG PO TABS
100.0000 mg | ORAL_TABLET | Freq: Every day | ORAL | 0 refills | Status: DC
Start: 2021-10-09 — End: 2022-06-22

## 2021-10-08 MED ORDER — FOLIC ACID 1 MG PO TABS
1.0000 mg | ORAL_TABLET | Freq: Every day | ORAL | 0 refills | Status: DC
Start: 2021-10-09 — End: 2022-07-13

## 2021-10-08 MED ORDER — LISINOPRIL 20 MG PO TABS: 20.0000 mg | ORAL_TABLET | Freq: Every day | ORAL | 0 refills | Status: DC

## 2021-10-08 MED ORDER — MAGNESIUM SULFATE 2 GM/50ML IV SOLN
2.0000 g | Freq: Once | INTRAVENOUS | Status: AC
  Administered 2021-10-08: 2 g via INTRAVENOUS
  Filled 2021-10-08: qty 50

## 2021-10-08 NOTE — Progress Notes (Signed)
Notified on call about patient's magnesium level of 1.6 and potassium level of 3.1. On call provider put in new orders. ?

## 2021-10-08 NOTE — Discharge Summary (Signed)
Physician Discharge Summary  ?Brett Molina QHU:[id number] DOB: 17-Mar-1991 DOA: 10/02/2021 ? ?PCP: Hoy Register, MD ? ?Admit date: 10/02/2021 ?Discharge date: 10/08/2021 ? ?Admitted From: Home ?Disposition: Home ? ?Recommendations for Outpatient Follow-up:  ?Follow up with PCP in 1 week with repeat CBC/CMP ?Recommend outpatient evaluation and follow-up with neurology ?Abstain from alcohol ?Follow up in ED if symptoms worsen or new appear ? ? ?Home Health: No ?Equipment/Devices: None ? ?Discharge Condition: Stable ?CODE STATUS: Full ?Diet recommendation: Heart healthy ? ?Brief/Interim Summary: ?31 year old M with history of alcohol dependence, withdrawal seizures and DTs was admitted with alcohol withdrawal with seizure-like activity, elevated LFTs, rhabdomyolysis and pancytopenia and fever.  He was treated with IV fluids, Librium taper with CIWA protocol and as needed Ativan.  During the hospitalization, his fever has resolved.  Infectious work-up was unrevealing.  Pancytopenia has resolved except for mild anemia.  CK total improving with IV fluids.  He is currently hemodynamically stable, tolerating diet with no further seizures.  Currently on Keppra.  He will be discharged home today on oral Keppra with outpatient follow-up with PCP and neurology. ? ?Discharge Diagnoses:  ? ?Alcohol withdrawal seizure ?Alcohol abuse ?-Presented with seizure-like activity possibly from alcohol withdrawal. ?-Seizure-free since hospitalization.  Currently on oral Keppra along with CIWA protocol with tapering doses of Librium and as needed Ativan along with scheduled thiamine, folic acid and multivitamin. ?-Will need outpatient evaluation and follow-up by neurology.  Patient should abstain from driving till cleared by PCP and/or neurology ?-Continue Keppra on discharge ?-Continue thiamine, folic acid and multivitamin on discharge ?-Continue Librium 10 mg daily for 2 days then discontinue ?-Patient was counseled regarding alcohol  abstinence.  TOC was also consulted ? ?Alcoholic hepatitis without ascites ?-Possibly from alcohol abuse.  Right upper quadrant ultrasound showed hepatic steatosis ?-LFTs improving.  Outpatient follow-up ? ?Hyponatremia ?-Sodium 121 on admission.  Possibly from dehydration and beer potomania.  Resolved. ? ?Rhabdomyolysis ?-CK peaked at 6100.  Treated with IV fluids.  Improving to 2970 today.  Patient does not have any muscle pain complaints.  Encourage oral hydration. ? ?Pancytopenia ?-Leukopenia and thrombocytopenia improved.  Patient has mild anemia which is stable.  Outpatient follow-up ? ?Hypertension ?-Continue home amlodipine and lisinopril ? ?AKI ?-Resolved ? ?Obesity ?-Outpatient follow-up ? ?Discharge Instructions ? ?Discharge Instructions   ? ? Ambulatory referral to Neurology   Complete by: As directed ?  ? An appointment is requested in approximately: 2 weeks for follow up of ?alcohol withdrawal seizures  ? Diet - low sodium heart healthy   Complete by: As directed ?  ? Increase activity slowly   Complete by: As directed ?  ? ?  ? ?Allergies as of 10/08/2021   ?No Known Allergies ?  ? ?  ?Medication List  ?  ? ?TAKE these medications   ? ?amLODipine 10 MG tablet ?Commonly known as: NORVASC ?Take 1 tablet (10 mg total) by mouth daily. ?  ?chlordiazePOXIDE 10 MG capsule ?Commonly known as: LIBRIUM ?10 mg daily for 2 days then discontinue ?What changed:  ?additional instructions ?Another medication with the same name was removed. Continue taking this medication, and follow the directions you see here. ?  ?folic acid 1 MG tablet ?Commonly known as: FOLVITE ?Take 1 tablet (1 mg total) by mouth daily. ?Start taking on: October 09, 2021 ?  ?levETIRAcetam 500 MG tablet ?Commonly known as: KEPPRA ?Take 1 tablet (500 mg total) by mouth 2 (two) times daily. ?  ?lisinopril 20 MG tablet ?Commonly known as:  ZESTRIL ?Take 1 tablet (20 mg total) by mouth daily. ?  ?multivitamin with minerals Tabs tablet ?Take 1 tablet by  mouth daily. ?Start taking on: October 09, 2021 ?  ?thiamine 100 MG tablet ?Take 1 tablet (100 mg total) by mouth daily. ?Start taking on: October 09, 2021 ?  ? ?  ? ? Follow-up Information   ? ? Hoy Register, MD. Schedule an appointment as soon as possible for a visit in 1 week(s).   ?Specialty: Family Medicine ?Contact information: ?[address] [place] ?[address] ?[place] [address] ?[phone] ? ? ?  ?  ? ?  ?  ? ?  ? ?No Known Allergies ? ?Consultations: ?None ? ?Procedures/Studies: ?DG Chest Port 1 View ? ?Result Date: 10/02/2021 ?CLINICAL DATA:  Cough.  Seizure. EXAM: PORTABLE CHEST 1 VIEW COMPARISON:  12/02/2018 FINDINGS: The cardiac silhouette, mediastinal and hilar contours are within normal limits and stable. The lungs are clear. The bony thorax is intact. IMPRESSION: No acute cardiopulmonary findings. Electronically Signed   By: Rudie Meyer M.D.   On: 10/02/2021 17:44  ? ?US Abdomen Limited RUQ (LIVER/GB) ? ?Result Date: 10/02/2021 ?CLINICAL DATA:  Elevated liver function tests EXAM: ULTRASOUND ABDOMEN LIMITED RIGHT UPPER QUADRANT COMPARISON:  None. FINDINGS: Gallbladder: No gallstones or wall thickening visualized. No sonographic Murphy sign noted by sonographer. Common bile duct: Diameter: 5 mm. Liver: No focal lesion identified. Increased parenchymal echogenicity. Portal vein is patent on color Doppler imaging with normal direction of blood flow towards the liver. Other: None. IMPRESSION: Hepatic steatosis. Please note limited evaluation for focal hepatic masses in a patient with hepatic steatosis due to decreased penetration of the acoustic ultrasound waves. Electronically Signed   By: Tish Frederickson M.D.   On: 10/02/2021 20:18   ? ? ? ?Subjective: ?Patient seen and examined at bedside.  Feels much better and wants to go home today.  Tolerating diet.  No overnight fever, vomiting, seizures reported. ? ?Discharge Exam: ?Vitals:  ? 10/07/21 2103 10/08/21 0517  ?BP: 131/79 (!) 140/100  ?Pulse: 97 80   ?Resp: 18 18  ?Temp: 99.2 ?F (37.3 ?C) 99.3 ?F (37.4 ?C)  ?SpO2: 100% 100%  ? ? ?General: Pt is alert, awake, not in acute distress.  Currently on room air. ?Cardiovascular: rate controlled, S1/S2 + ?Respiratory: bilateral decreased breath sounds at bases ?Abdominal: Soft, NT, ND, bowel sounds + ?Extremities: no edema, no cyanosis ? ? ? ?The results of significant diagnostics from this hospitalization (including imaging, microbiology, ancillary and laboratory) are listed below for reference.   ? ? ?Microbiology: ?Recent Results (from the past 240 hour(s))  ?Culture, blood (Routine X 2) w Reflex to ID Panel     Status: None  ? Collection Time: 10/02/21  3:50 PM  ? Specimen: BLOOD  ?Result Value Ref Range Status  ? Specimen Description   Final  ?  BLOOD BLOOD LEFT HAND ?Performed at [facility], [address] [place], [place] [address] ?  ? Special Requests   Final  ?  BOTTLES DRAWN AEROBIC AND ANAEROBIC Blood Culture results may not be optimal due to an inadequate volume of blood received in culture bottles ?Performed at [facility], [address] [place], [place] [address] ?  ? Culture   Final  ?  NO GROWTH 5 DAYS ?Performed at [facility], [address], [place], [place] [address] ?  ? Report Status 10/07/2021 FINAL  Final  ?Culture, blood (Routine X 2) w Reflex to  ID Panel     Status: None  ? Collection Time: 10/02/21  4:17 PM  ? Specimen: BLOOD  ?Result Value Ref Range Status  ? Specimen Description   Final  ?  BLOOD ?Performed at [facility] [address], [place], [place] [address] ?  ? Special Requests   Final  ?  BOTTLES DRAWN AEROBIC AND ANAEROBIC Blood Culture adequate volume ?Performed at [facility], [address] [place], [place] [address] ?  ? Culture   Final  ?  NO GROWTH 5 DAYS ?Performed at [facility], [address] [place], [place] [address] ?  ? Report Status 10/07/2021 FINAL  Final  ?Resp  Panel by RT-PCR (Flu A&B, Covid) Nasopharyngeal Swab     Status: None  ? Collection Time: 10/02/21  4:17 PM  ? Specimen: Nasopharyngeal Swab; Nasopharyngeal(NP) swabs in vial transport medium  ?Result Value Ref

## 2021-10-08 NOTE — Plan of Care (Signed)
?  Problem: Education: ?Goal: Knowledge of General Education information will improve ?Description: Including pain rating scale, medication(s)/side effects and non-pharmacologic comfort measures ?Outcome: Adequate for Discharge ?  ?Problem: Health Behavior/Discharge Planning: ?Goal: Ability to manage health-related needs will improve ?Outcome: Adequate for Discharge ?  ?Problem: Clinical Measurements: ?Goal: Ability to maintain clinical measurements within normal limits will improve ?Outcome: Adequate for Discharge ?Goal: Diagnostic test results will improve ?Outcome: Adequate for Discharge ?  ?Problem: Education: ?Goal: Knowledge of disease or condition will improve ?Outcome: Adequate for Discharge ?Goal: Understanding of discharge needs will improve ?Outcome: Adequate for Discharge ?  ?Problem: Health Behavior/Discharge Planning: ?Goal: Ability to identify changes in lifestyle to reduce recurrence of condition will improve ?Outcome: Adequate for Discharge ?Goal: Identification of resources available to assist in meeting health care needs will improve ?Outcome: Adequate for Discharge ?  ?Problem: Physical Regulation: ?Goal: Complications related to the disease process, condition or treatment will be avoided or minimized ?Outcome: Adequate for Discharge ?  ?Problem: Safety: ?Goal: Ability to remain free from injury will improve ?Outcome: Adequate for Discharge ?  ?

## 2021-10-08 NOTE — Progress Notes (Signed)
Patient transferred from bed to chair with no assist, no c/o of dizziness.  Patient was steady.  Tremors in legs and arms observed.  Patient marched in place and turned 360 degrees without any indications of loss of balance or unsteadiness.  Patient's wife at bedside, expressed that she did not have any concerns about patient's mobility status. ? ?Bradd Burner, RN  ?

## 2021-10-08 NOTE — Progress Notes (Signed)
Discharge instructions provided to and reviewed with patient and patient's wife.  PIV and cardiac monitoring removed.  Patient escorted to main entrance with belongings via wheelchair for transport home with wife. ? ?Patient called secretary at 1140 to request a doctor's note excusing him from work.  MD notified. ? ?Bradd Burner, RN  ?

## 2021-10-08 NOTE — Plan of Care (Signed)
?  Problem: Education: ?Goal: Knowledge of General Education information will improve ?Description: Including pain rating scale, medication(s)/side effects and non-pharmacologic comfort measures ?10/08/2021 0126 by Amalia Hailey, RN ?Outcome: Progressing ?10/08/2021 0124 by Amalia Hailey, RN ?Outcome: Progressing ?  ?Problem: Physical Regulation: ?Goal: Complications related to the disease process, condition or treatment will be avoided or minimized ?Outcome: Progressing ?  ?Problem: Safety: ?Goal: Ability to remain free from injury will improve ?Outcome: Progressing ?  ?

## 2021-10-09 ENCOUNTER — Telehealth: Payer: Self-pay

## 2021-10-09 NOTE — Telephone Encounter (Signed)
Transition Care Management Unsuccessful Follow-up Telephone Call ? ?Date of discharge and from where:  10/08/2021, [facility]  ? ?Attempts:  1st Attempt ? ?Reason for unsuccessful TCM follow-up call:  Left voice message # [phone]. Call back requested.  Need to discuss scheduling a hospital follow up appointment ? ? ? ?

## 2021-10-10 ENCOUNTER — Telehealth: Payer: Self-pay

## 2021-10-10 NOTE — Telephone Encounter (Signed)
Transition Care Management Unsuccessful Follow-up Telephone Call ?  ?Date of discharge and from where:  10/08/2021, [facility]  ?  ?Attempts:  2nd  Attempt ?  ?Reason for unsuccessful TCM follow-up call:  Left voice message # [phone]. Call back requested.  ?

## 2021-10-13 ENCOUNTER — Telehealth: Payer: Self-pay

## 2021-10-13 NOTE — Telephone Encounter (Signed)
Transition Care Management Unsuccessful Follow-up Telephone Call ? ?Date of discharge and from where:  10/08/2021, [facility]  ? ?Attempts:  3rd Attempt ? ?Reason for unsuccessful TCM follow-up call:  Left voice message # [phone]. Call back requested.   ? ?Letter sent to patient requesting he contact [facility] to schedule a hospital follow up appointment as we have not been able to reach him  ? ? ? ?

## 2021-12-01 ENCOUNTER — Emergency Department (HOSPITAL_COMMUNITY)
Admission: EM | Admit: 2021-12-01 | Discharge: 2021-12-01 | Disposition: A | Discharge status: Home / Self Care | Payer: Self-pay | Attending: Emergency Medicine | Admitting: Emergency Medicine

## 2021-12-01 ENCOUNTER — Other Ambulatory Visit: Payer: Self-pay

## 2021-12-01 ENCOUNTER — Encounter (HOSPITAL_COMMUNITY): Payer: Self-pay

## 2021-12-01 DIAGNOSIS — R000 Tachycardia, unspecified: Secondary | ICD-10-CM | POA: Insufficient documentation

## 2021-12-01 DIAGNOSIS — I10 Essential (primary) hypertension: Secondary | ICD-10-CM | POA: Insufficient documentation

## 2021-12-01 DIAGNOSIS — F10930 Alcohol use, unspecified with withdrawal, uncomplicated: Secondary | ICD-10-CM

## 2021-12-01 DIAGNOSIS — Z79899 Other long term (current) drug therapy: Secondary | ICD-10-CM | POA: Insufficient documentation

## 2021-12-01 DIAGNOSIS — R630 Anorexia: Secondary | ICD-10-CM | POA: Insufficient documentation

## 2021-12-01 DIAGNOSIS — F10239 Alcohol dependence with withdrawal, unspecified: Secondary | ICD-10-CM | POA: Insufficient documentation

## 2021-12-01 DIAGNOSIS — R531 Weakness: Secondary | ICD-10-CM | POA: Insufficient documentation

## 2021-12-01 DIAGNOSIS — E860 Dehydration: Secondary | ICD-10-CM | POA: Insufficient documentation

## 2021-12-01 LAB — CBC WITH DIFFERENTIAL/PLATELET
Abs Immature Granulocytes: 0.03 10*3/uL (ref 0.00–0.07)
Basophils Absolute: 0 10*3/uL (ref 0.0–0.1)
Basophils Relative: 0 %
Eosinophils Absolute: 0 10*3/uL (ref 0.0–0.5)
Eosinophils Relative: 0 %
HCT: 39.6 % (ref 39.0–52.0)
Hemoglobin: 14.3 g/dL (ref 13.0–17.0)
Immature Granulocytes: 0 %
Lymphocytes Relative: 8 %
Lymphs Abs: 0.8 10*3/uL (ref 0.7–4.0)
MCH: 33.3 pg (ref 26.0–34.0)
MCHC: 36.1 g/dL — ABNORMAL HIGH (ref 30.0–36.0)
MCV: 92.1 fL (ref 80.0–100.0)
Monocytes Absolute: 0.4 10*3/uL (ref 0.1–1.0)
Monocytes Relative: 5 %
Neutro Abs: 8.3 10*3/uL — ABNORMAL HIGH (ref 1.7–7.7)
Neutrophils Relative %: 87 %
Platelets: 222 10*3/uL (ref 150–400)
RBC: 4.3 MIL/uL (ref 4.22–5.81)
RDW: 11.8 % (ref 11.5–15.5)
WBC: 9.6 10*3/uL (ref 4.0–10.5)
nRBC: 0 % (ref 0.0–0.2)

## 2021-12-01 LAB — URINALYSIS, ROUTINE W REFLEX MICROSCOPIC
Bacteria, UA: NONE SEEN
Bilirubin Urine: NEGATIVE
Glucose, UA: NEGATIVE mg/dL
Ketones, ur: 80 mg/dL — AB
Leukocytes,Ua: NEGATIVE
Nitrite: NEGATIVE
Protein, ur: >=300 mg/dL — AB
Specific Gravity, Urine: 1.017 (ref 1.005–1.030)
pH: 5 (ref 5.0–8.0)

## 2021-12-01 LAB — COMPREHENSIVE METABOLIC PANEL
ALT: 31 U/L (ref 0–44)
AST: 59 U/L — ABNORMAL HIGH (ref 15–41)
Albumin: 5 g/dL (ref 3.5–5.0)
Alkaline Phosphatase: 60 U/L (ref 38–126)
Anion gap: 27 — ABNORMAL HIGH (ref 5–15)
BUN: 10 mg/dL (ref 6–20)
CO2: 19 mmol/L — ABNORMAL LOW (ref 22–32)
Calcium: 9.2 mg/dL (ref 8.9–10.3)
Chloride: 86 mmol/L — ABNORMAL LOW (ref 98–111)
Creatinine, Ser: 0.95 mg/dL (ref 0.61–1.24)
GFR, Estimated: >60 mL/min (ref >60)
Glucose, Bld: 91 mg/dL (ref 70–99)
Potassium: 3.9 mmol/L (ref 3.5–5.1)
Sodium: 132 mmol/L — ABNORMAL LOW (ref 135–145)
Total Bilirubin: 2.2 mg/dL — ABNORMAL HIGH (ref 0.3–1.2)
Total Protein: 9.1 g/dL — ABNORMAL HIGH (ref 6.5–8.1)

## 2021-12-01 LAB — BASIC METABOLIC PANEL
Anion gap: 23 — ABNORMAL HIGH (ref 5–15)
BUN: 7 mg/dL (ref 6–20)
CO2: 18 mmol/L — ABNORMAL LOW (ref 22–32)
Calcium: 8.9 mg/dL (ref 8.9–10.3)
Chloride: 94 mmol/L — ABNORMAL LOW (ref 98–111)
Creatinine, Ser: 1.07 mg/dL (ref 0.61–1.24)
GFR, Estimated: >60 mL/min (ref >60)
Glucose, Bld: 77 mg/dL (ref 70–99)
Potassium: 3.9 mmol/L (ref 3.5–5.1)
Sodium: 135 mmol/L (ref 135–145)

## 2021-12-01 LAB — MAGNESIUM: Magnesium: 2.3 mg/dL (ref 1.7–2.4)

## 2021-12-01 LAB — LIPASE, BLOOD: Lipase: 25 U/L (ref 11–51)

## 2021-12-01 LAB — CK: Total CK: 489 U/L — ABNORMAL HIGH (ref 49–397)

## 2021-12-01 MED ORDER — LORAZEPAM 2 MG/ML IJ SOLN: 0.0000 mg | Freq: Two times a day (BID) | INTRAMUSCULAR | Status: DC

## 2021-12-01 MED ORDER — LORAZEPAM 1 MG PO TABS: 0.0000 mg | ORAL_TABLET | Freq: Four times a day (QID) | ORAL | Status: DC

## 2021-12-01 MED ORDER — METOCLOPRAMIDE HCL 5 MG/ML IJ SOLN
10.0000 mg | Freq: Once | INTRAMUSCULAR | Status: AC
  Administered 2021-12-01: 10 mg via INTRAVENOUS
  Filled 2021-12-01: qty 2

## 2021-12-01 MED ORDER — THIAMINE HCL 100 MG/ML IJ SOLN
100.0000 mg | Freq: Once | INTRAMUSCULAR | Status: AC
  Administered 2021-12-01: 100 mg via INTRAVENOUS
  Filled 2021-12-01: qty 2

## 2021-12-01 MED ORDER — CHLORDIAZEPOXIDE HCL 25 MG PO CAPS: ORAL_CAPSULE | ORAL | 0 refills | Status: DC

## 2021-12-01 MED ORDER — CHLORDIAZEPOXIDE HCL 25 MG PO CAPS
50.0000 mg | ORAL_CAPSULE | Freq: Once | ORAL | Status: AC
  Administered 2021-12-01: 50 mg via ORAL
  Filled 2021-12-01: qty 2

## 2021-12-01 MED ORDER — ONDANSETRON HCL 4 MG PO TABS: 4.0000 mg | ORAL_TABLET | Freq: Three times a day (TID) | ORAL | 0 refills | Status: DC | PRN

## 2021-12-01 MED ORDER — LACTATED RINGERS IV BOLUS
1000.0000 mL | Freq: Once | INTRAVENOUS | Status: DC
Start: 2021-12-01 — End: 2021-12-01

## 2021-12-01 MED ORDER — LORAZEPAM 1 MG PO TABS: 0.0000 mg | ORAL_TABLET | Freq: Two times a day (BID) | ORAL | Status: DC

## 2021-12-01 MED ORDER — LORAZEPAM 2 MG/ML IJ SOLN
1.0000 mg | Freq: Once | INTRAMUSCULAR | Status: AC
  Administered 2021-12-01: 1 mg via INTRAVENOUS
  Filled 2021-12-01: qty 1

## 2021-12-01 MED ORDER — LORAZEPAM 2 MG/ML IJ SOLN
1.0000 mg | Freq: Once | INTRAMUSCULAR | Status: DC
Start: 2021-12-01 — End: 2021-12-02

## 2021-12-01 MED ORDER — ONDANSETRON HCL 4 MG/2ML IJ SOLN
4.0000 mg | Freq: Once | INTRAMUSCULAR | Status: AC
  Administered 2021-12-01: 4 mg via INTRAVENOUS
  Filled 2021-12-01: qty 2

## 2021-12-01 MED ORDER — DEXTROSE 5 % IN LACTATED RINGERS IV BOLUS
1000.0000 mL | Freq: Once | INTRAVENOUS | Status: AC
  Administered 2021-12-01: 1000 mL via INTRAVENOUS

## 2021-12-01 MED ORDER — LORAZEPAM 2 MG/ML IJ SOLN
2.0000 mg | Freq: Once | INTRAMUSCULAR | Status: AC
  Administered 2021-12-01: 2 mg via INTRAVENOUS
  Filled 2021-12-01: qty 1

## 2021-12-01 MED ORDER — LACTATED RINGERS IV BOLUS
1000.0000 mL | Freq: Once | INTRAVENOUS | Status: AC
Start: 2021-12-01 — End: 2021-12-01
  Administered 2021-12-01: 1000 mL via INTRAVENOUS

## 2021-12-01 MED ORDER — LORAZEPAM 2 MG/ML IJ SOLN: 0.0000 mg | Freq: Four times a day (QID) | INTRAMUSCULAR | Status: DC

## 2021-12-01 MED ORDER — LACTATED RINGERS IV BOLUS
2000.0000 mL | Freq: Once | INTRAVENOUS | Status: AC
Start: 2021-12-01 — End: 2021-12-01
  Administered 2021-12-01: 2000 mL via INTRAVENOUS

## 2021-12-01 NOTE — ED Provider Notes (Signed)
[facility]-EMERGENCY DEPT Provider Note   CSN: [id number] Arrival date & time: 12/01/21  0300     History  Chief Complaint  Patient presents with   Nausea   Emesis    Brett Molina is a 31 y.o. male.  HPI Patient presents for generalized weakness, muscle cramps, and poor p.o. intake.  Medical history includes HTN, alcohol abuse, alcohol withdrawal, depression, GI bleed.  He states that he has not been able to take in any food in the past 4 days.  He has been able to take in only very limited amounts of fluids.  He has experienced diffuse muscle cramping.  He feels fatigued and generalized weakness that he attributes to dehydration.  He has had daily vomiting but currently denies any nausea.  He has not had diarrhea.  Current alcohol intake is approximately 3 12 ounce beers per day.  His last drink was 3 PM yesterday.  He currently denies any headache or anxiety.  History per wife: Patient had previous heavy alcohol intake up until last Thursday.  This was while she, herself, was hospitalized.  When she returned home, patient cut back on his alcohol intake.  This is around the time that he started feeling unwell.  She confirms that patient has had p.o. intolerance for the past 4 days.    Home Medications Prior to Admission medications   Medication Sig Start Date End Date Taking? Authorizing Provider  amLODipine (NORVASC) 10 MG tablet Take 1 tablet (10 mg total) by mouth daily. 10/08/21 11/07/21  Glade Lloyd, MD  chlordiazePOXIDE (LIBRIUM) 10 MG capsule 10 mg daily for 2 days then discontinue 10/08/21   Glade Lloyd, MD  folic acid (FOLVITE) 1 MG tablet Take 1 tablet (1 mg total) by mouth daily. 10/09/21   Glade Lloyd, MD  levETIRAcetam (KEPPRA) 500 MG tablet Take 1 tablet (500 mg total) by mouth 2 (two) times daily. 10/08/21 11/07/21  Glade Lloyd, MD  lisinopril (ZESTRIL) 20 MG tablet Take 1 tablet (20 mg total) by mouth daily. 10/08/21 11/07/21  Glade Lloyd, MD   Multiple Vitamin (MULTIVITAMIN WITH MINERALS) TABS tablet Take 1 tablet by mouth daily. 10/09/21   Glade Lloyd, MD  thiamine 100 MG tablet Take 1 tablet (100 mg total) by mouth daily. 10/09/21   Glade Lloyd, MD      Allergies    Patient has no known allergies.    Review of Systems   Review of Systems  Constitutional:  Positive for fatigue.  Gastrointestinal:  Positive for nausea and vomiting.  Musculoskeletal:  Positive for myalgias.  Neurological:  Positive for weakness (Generalized).  All other systems reviewed and are negative.  Physical Exam Updated Vital Signs BP (!) 154/89   Pulse (!) 132   Temp 99.1 F (37.3 C) (Oral)   Resp 18   Ht 6\' 2"  (1.88 m)   Wt 104.3 kg   SpO2 100%   BMI 29.53 kg/m  Physical Exam Vitals and nursing note reviewed.  Constitutional:      General: He is not in acute distress.    Appearance: Normal appearance. He is well-developed and normal weight. He is not ill-appearing, toxic-appearing or diaphoretic.  HENT:     Head: Normocephalic and atraumatic.     Right Ear: External ear normal.     Left Ear: External ear normal.     Nose: Nose normal.     Mouth/Throat:     Mouth: Mucous membranes are moist.     Pharynx: Oropharynx  is clear.  Eyes:     Extraocular Movements: Extraocular movements intact.     Conjunctiva/sclera: Conjunctivae normal.  Cardiovascular:     Rate and Rhythm: Normal rate and regular rhythm.     Heart sounds: No murmur heard. Pulmonary:     Effort: Pulmonary effort is normal. No respiratory distress.     Breath sounds: Normal breath sounds.  Abdominal:     Palpations: Abdomen is soft.     Tenderness: There is no abdominal tenderness.  Musculoskeletal:        General: No swelling or tenderness. Normal range of motion.     Cervical back: Normal range of motion and neck supple.     Right lower leg: No edema.     Left lower leg: No edema.  Skin:    General: Skin is warm and dry.     Capillary Refill: Capillary  refill takes less than 2 seconds.     Coloration: Skin is not jaundiced or pale.  Neurological:     General: No focal deficit present.     Mental Status: He is alert and oriented to person, place, and time.     Cranial Nerves: No cranial nerve deficit.     Sensory: No sensory deficit.     Motor: No weakness.     Coordination: Coordination normal.  Psychiatric:        Mood and Affect: Mood normal.        Behavior: Behavior normal.        Thought Content: Thought content normal.        Judgment: Judgment normal.    ED Results / Procedures / Treatments   Labs (all labs ordered are listed, but only abnormal results are displayed) Labs Reviewed  COMPREHENSIVE METABOLIC PANEL - Abnormal; Notable for the following components:      Result Value   Sodium 132 (*)    Chloride 86 (*)    CO2 19 (*)    Total Protein 9.1 (*)    AST 59 (*)    Total Bilirubin 2.2 (*)    Anion gap 27 (*)    All other components within normal limits  CBC WITH DIFFERENTIAL/PLATELET - Abnormal; Notable for the following components:   MCHC 36.1 (*)    Neutro Abs 8.3 (*)    All other components within normal limits  URINALYSIS, ROUTINE W REFLEX MICROSCOPIC - Abnormal; Notable for the following components:   Hgb urine dipstick MODERATE (*)    Ketones, ur 80 (*)    Protein, ur >=300 (*)    All other components within normal limits  LIPASE, BLOOD  MAGNESIUM  BASIC METABOLIC PANEL  CK    EKG EKG Interpretation  Date/Time:  Monday Dec 01 2021 08:37:48 EDT Ventricular Rate:  107 PR Interval:  126 QRS Duration: 92 QT Interval:  419 QTC Calculation: 560 R Axis:   51 Text Interpretation: Sinus tachycardia Prolonged QT interval Confirmed by Gloris Manchester ([id number]) on 12/01/2021 9:41:19 AM  Radiology No results found.  Procedures Procedures    Medications Ordered in ED Medications  LORazepam (ATIVAN) injection 1 mg (0 mg Intravenous Hold 12/01/21 1556)  LORazepam (ATIVAN) injection 0-4 mg (has no  administration in time range)    Or  LORazepam (ATIVAN) tablet 0-4 mg (has no administration in time range)  LORazepam (ATIVAN) injection 0-4 mg (has no administration in time range)    Or  LORazepam (ATIVAN) tablet 0-4 mg (has no administration in time range)  LORazepam (ATIVAN) injection  2 mg (has no administration in time range)  lactated ringers bolus 2,000 mL (0 mLs Intravenous Stopped 12/01/21 1230)  lactated ringers bolus 1,000 mL (1,000 mLs Intravenous New Bag/Given 12/01/21 1141)  metoCLOPramide (REGLAN) injection 10 mg (10 mg Intravenous Given 12/01/21 1141)  chlordiazePOXIDE (LIBRIUM) capsule 50 mg (50 mg Oral Given 12/01/21 1313)  LORazepam (ATIVAN) injection 1 mg (1 mg Intravenous Given 12/01/21 1314)  thiamine (B-1) injection 100 mg (100 mg Intravenous Given 12/01/21 1531)  LORazepam (ATIVAN) injection 2 mg (2 mg Intravenous Given 12/01/21 1531)  ondansetron (ZOFRAN) injection 4 mg (4 mg Intravenous Given 12/01/21 1531)    ED Course/ Medical Decision Making/ A&P                           Medical Decision Making Amount and/or Complexity of Data Reviewed Labs: ordered.  Risk Prescription drug management.   This patient presents to the ED for concern of muscle aches, fatigue, and poor p.o. tolerance, this involves an extensive number of treatment options, and is a complaint that carries with it a high risk of complications and morbidity.  The differential diagnosis includes alcohol withdrawal, dehydration, gastritis, GERD, cyclic vomiting, metabolic derangements   Co morbidities that complicate the patient evaluation  HTN, alcohol abuse, alcohol withdrawal, depression, GI bleed   Additional history obtained:  Additional history obtained from patient's wife External records from outside source obtained and reviewed including EMR   Lab Tests:  I Ordered, and personally interpreted labs.  The pertinent results include: Hypochloremia, anion gap metabolic acidosis, normal  hemoglobin, no leukocytosis, normal lipase, evidence of ketonuria and myoglobinuria on urine  Cardiac Monitoring: / EKG:  The patient was maintained on a cardiac monitor.  I personally viewed and interpreted the cardiac monitored which showed an underlying rhythm of: Sinus rhythm  Problem List / ED Course / Critical interventions / Medication management  Patient presenting for nausea, vomiting, p.o. intolerance, and muscle aches.  He has a history of alcohol abuse and recently cut back on his alcohol intake.  This is around the time he started feeling unwell.  Over the past 4 days, he has had GI upset and has had very little p.o. intake.  He does continue to drink approximately 3 beers per day.  Last drink was yesterday.  Patient's vital signs on arrival are notable for tachycardia.  On initial exam, he does not appear to be actively withdrawing from alcohol.  His abdomen is soft and without tenderness.  He initially denied any nausea.  2 L of IV fluids was given for dehydration.  On reassessment, patient reports that he has improved symptoms.  He did, however, remained tachycardic.  At this point, he was accompanied by his wife at bedside who provided additional history.  She reports that patient's symptoms began when he cut back on his alcohol use 4 days ago.  Patient's tachycardia is likely, in part, due to withdrawal.  Patient was treated with p.o. Librium and IV Ativan.  An additional liter of IV fluids was given.  Patient did trial sips of water but had subsequent nausea and vomiting.  He was given Reglan and subsequently Zofran for symptomatic relief.  Additional doses of Ativan were given for continued treatment of alcohol withdrawal.  At time of signout, patient is comfortable but remains tachycardic.  Care of patient was signed out to oncoming ED provider. I ordered medication including IV fluids for dehydration; Librium and Ativan for alcohol withdrawal;  Reglan and Zofran for nausea Reevaluation  of the patient after these medicines showed that the patient improved I have reviewed the patients home medicines and have made adjustments as needed   Social Determinants of Health:  History of alcohol abuse  CRITICAL CARE Performed by: Gloris Manchester   Total critical care time: 35 minutes  Critical care time was exclusive of separately billable procedures and treating other patients.  Critical care was necessary to treat or prevent imminent or life-threatening deterioration.  Critical care was time spent personally by me on the following activities: development of treatment plan with patient and/or surrogate as well as nursing, discussions with consultants, evaluation of patient's response to treatment, examination of patient, obtaining history from patient or surrogate, ordering and performing treatments and interventions, ordering and review of laboratory studies, ordering and review of radiographic studies, pulse oximetry and re-evaluation of patient's condition.         Final Clinical Impression(s) / ED Diagnoses Final diagnoses:  Dehydration  Alcohol withdrawal syndrome without complication (HCC)  Tachycardia    Rx / DC Orders ED Discharge Orders     None         Gloris Manchester, MD 12/01/21 1622

## 2021-12-01 NOTE — ED Notes (Signed)
Pt states understanding of dc instructions, importance of follow up. Pt denies questions or concerns. No belongings left in room upon dc.

## 2021-12-01 NOTE — ED Triage Notes (Signed)
Pt coming from home with c/o n/v x 1 week. Pt hx of ETOH, last drink yesterday. Pt also reports not taking home medications because he has "run out."  500 cc NS bolus 4 mg Zofran

## 2021-12-01 NOTE — ED Provider Notes (Signed)
Signout from Dr. Durwin Nora.  31 year old male with alcohol abuse here after self tapering alcohol over the weekend.  He is tachycardic tremulous having nausea and vomiting. He has received fluids and benzodiazepines vitamins.  Withdrawal symptoms seem improved although still remains tachycardic and has a gap.  He denies any other toxic alcohols besides ethanol.  Continues fluids and benzos.  Add glucose.  Patient very concerned about staying in the hospital overnight as he is new to a job and is afraid he might lose it. Physical Exam  BP (!) 145/72   Pulse (!) 122   Temp 99.1 F (37.3 C) (Oral)   Resp 18   Ht 6\' 2"  (1.88 m)   Wt 104.3 kg   SpO2 99%   BMI 29.53 kg/m   Physical Exam  Procedures  Procedures  ED Course / MDM    Medical Decision Making Amount and/or Complexity of Data Reviewed Labs: ordered.  Risk Prescription drug management.   8:20 PM.  Patient CIWA was 2.  He still remains tachycardic and hypertensive.  He is adamant about going home and his significant other is supportive of this decision.  He has been on Librium taper before.  He is instructed not to drink alcohol while taking it.  He understands there is a risk of seizure.  He understands he can return if he changes his mind.       , MD 12/02/21 1036

## 2022-06-17 ENCOUNTER — Emergency Department (HOSPITAL_COMMUNITY)
Admission: EM | Admit: 2022-06-17 | Discharge: 2022-06-17 | Discharge status: Against Medical Advice | Payer: Medicaid Other | Source: Home / Self Care | Attending: Emergency Medicine | Admitting: Emergency Medicine

## 2022-06-17 ENCOUNTER — Emergency Department (HOSPITAL_COMMUNITY): Payer: Medicaid Other

## 2022-06-17 ENCOUNTER — Encounter (HOSPITAL_COMMUNITY): Payer: Self-pay

## 2022-06-17 DIAGNOSIS — S0990XA Unspecified injury of head, initial encounter: Secondary | ICD-10-CM | POA: Insufficient documentation

## 2022-06-17 DIAGNOSIS — S01501A Unspecified open wound of lip, initial encounter: Secondary | ICD-10-CM | POA: Insufficient documentation

## 2022-06-17 DIAGNOSIS — W01198A Fall on same level from slipping, tripping and stumbling with subsequent striking against other object, initial encounter: Secondary | ICD-10-CM | POA: Insufficient documentation

## 2022-06-17 DIAGNOSIS — Z5321 Procedure and treatment not carried out due to patient leaving prior to being seen by health care provider: Secondary | ICD-10-CM | POA: Insufficient documentation

## 2022-06-17 NOTE — ED Triage Notes (Signed)
Pt comes via [facility] from downtown, pt has ETOH on board and was attempting to walk and fell forward and face planted, has busted lip.

## 2022-06-17 NOTE — ED Notes (Signed)
Patient states he longer wants to be seen and would like to leave. Patient encouraged to stay to be seen. Patient ambulated out of department independently with steady gait.

## 2022-06-17 NOTE — ED Provider Triage Note (Signed)
Emergency Medicine Provider Triage Evaluation Note  Brett Molina , a 31 y.o. male  was evaluated in triage.   [place].  [employer] attempted to get him to leave, got up and face planted on the concrete.  No LOC.  EtOH on board.  States he recalls everything.  Denies pain.  Review of Systems  Positive: EtOH, fall Negative: fever  Physical Exam  BP 117/77 (BP Location: Left Arm)   Pulse 60   Temp (!) 97.4 F (36.3 C) (Oral)   Resp 13   SpO2 100%   Gen:   Awake, no distress   Resp:  Normal effort  MSK:   Moves extremities without difficulty  Other:  Severely intoxicated, arouses when yelled at very loudly, Abrasions noted to the lower lip and nose, drooling  Medical Decision Making  Medically screening exam initiated at 5:36 AM.  Appropriate orders placed.  Brett Molina was informed that the remainder of the evaluation will be completed by another provider, this initial triage assessment does not replace that evaluation, and the importance of remaining in the ED until their evaluation is complete.  Fall, facial trauma.  No LOC.  Heavily intoxicated, drooling on self in triage.  Does arouse to loud stimuli.  Will obtain CT head/neck/face given level of intoxication.   Garlon Hatchet, PA-C 06/17/22 [phone]

## 2022-06-18 ENCOUNTER — Inpatient Hospital Stay (HOSPITAL_COMMUNITY): Payer: Medicaid Other

## 2022-06-18 ENCOUNTER — Emergency Department (HOSPITAL_COMMUNITY): Payer: Medicaid Other

## 2022-06-18 ENCOUNTER — Inpatient Hospital Stay (HOSPITAL_COMMUNITY)
Admission: EM | Admit: 2022-06-18 | Discharge: 2022-06-24 | DRG: 896 | Disposition: A | Discharge status: Home / Self Care | Payer: Medicaid Other | Attending: Family Medicine | Admitting: Family Medicine

## 2022-06-18 ENCOUNTER — Inpatient Hospital Stay (HOSPITAL_COMMUNITY): Payer: Self-pay

## 2022-06-18 ENCOUNTER — Encounter (HOSPITAL_COMMUNITY): Payer: Self-pay

## 2022-06-18 DIAGNOSIS — Z5900 Homelessness unspecified: Secondary | ICD-10-CM | POA: Diagnosis not present

## 2022-06-18 DIAGNOSIS — I952 Hypotension due to drugs: Secondary | ICD-10-CM | POA: Diagnosis not present

## 2022-06-18 DIAGNOSIS — R569 Unspecified convulsions: Secondary | ICD-10-CM

## 2022-06-18 DIAGNOSIS — E8721 Acute metabolic acidosis: Secondary | ICD-10-CM | POA: Diagnosis present

## 2022-06-18 DIAGNOSIS — X31XXXA Exposure to excessive natural cold, initial encounter: Secondary | ICD-10-CM

## 2022-06-18 DIAGNOSIS — J9601 Acute respiratory failure with hypoxia: Secondary | ICD-10-CM | POA: Diagnosis present

## 2022-06-18 DIAGNOSIS — I959 Hypotension, unspecified: Secondary | ICD-10-CM | POA: Diagnosis present

## 2022-06-18 DIAGNOSIS — Z20822 Contact with and (suspected) exposure to covid-19: Secondary | ICD-10-CM | POA: Diagnosis present

## 2022-06-18 DIAGNOSIS — T68XXXA Hypothermia, initial encounter: Secondary | ICD-10-CM | POA: Diagnosis present

## 2022-06-18 DIAGNOSIS — G40909 Epilepsy, unspecified, not intractable, without status epilepticus: Secondary | ICD-10-CM | POA: Diagnosis present

## 2022-06-18 DIAGNOSIS — G928 Other toxic encephalopathy: Secondary | ICD-10-CM | POA: Diagnosis present

## 2022-06-18 DIAGNOSIS — R402431 Glasgow coma scale score 3-8, in the field [EMT or ambulance]: Secondary | ICD-10-CM

## 2022-06-18 DIAGNOSIS — Y908 Blood alcohol level of 240 mg/100 ml or more: Secondary | ICD-10-CM | POA: Diagnosis present

## 2022-06-18 DIAGNOSIS — Z8249 Family history of ischemic heart disease and other diseases of the circulatory system: Secondary | ICD-10-CM | POA: Diagnosis not present

## 2022-06-18 DIAGNOSIS — E870 Hyperosmolality and hypernatremia: Secondary | ICD-10-CM | POA: Diagnosis present

## 2022-06-18 DIAGNOSIS — M109 Gout, unspecified: Secondary | ICD-10-CM | POA: Diagnosis present

## 2022-06-18 DIAGNOSIS — J9602 Acute respiratory failure with hypercapnia: Secondary | ICD-10-CM | POA: Diagnosis present

## 2022-06-18 DIAGNOSIS — A419 Sepsis, unspecified organism: Secondary | ICD-10-CM | POA: Diagnosis not present

## 2022-06-18 DIAGNOSIS — F10229 Alcohol dependence with intoxication, unspecified: Principal | ICD-10-CM | POA: Diagnosis present

## 2022-06-18 DIAGNOSIS — F10921 Alcohol use, unspecified with intoxication delirium: Secondary | ICD-10-CM | POA: Diagnosis not present

## 2022-06-18 DIAGNOSIS — T4275XA Adverse effect of unspecified antiepileptic and sedative-hypnotic drugs, initial encounter: Secondary | ICD-10-CM | POA: Diagnosis not present

## 2022-06-18 DIAGNOSIS — Z79899 Other long term (current) drug therapy: Secondary | ICD-10-CM

## 2022-06-18 DIAGNOSIS — F10929 Alcohol use, unspecified with intoxication, unspecified: Secondary | ICD-10-CM | POA: Diagnosis present

## 2022-06-18 DIAGNOSIS — J690 Pneumonitis due to inhalation of food and vomit: Secondary | ICD-10-CM | POA: Diagnosis present

## 2022-06-18 DIAGNOSIS — I428 Other cardiomyopathies: Secondary | ICD-10-CM | POA: Diagnosis not present

## 2022-06-18 DIAGNOSIS — I10 Essential (primary) hypertension: Secondary | ICD-10-CM | POA: Diagnosis present

## 2022-06-18 DIAGNOSIS — E876 Hypokalemia: Secondary | ICD-10-CM | POA: Diagnosis present

## 2022-06-18 LAB — CBC WITH DIFFERENTIAL/PLATELET
Abs Immature Granulocytes: 0.31 10*3/uL — ABNORMAL HIGH (ref 0.00–0.07)
Basophils Absolute: 0 10*3/uL (ref 0.0–0.1)
Basophils Relative: 0 %
Eosinophils Absolute: 0 10*3/uL (ref 0.0–0.5)
Eosinophils Relative: 0 %
HCT: 48.5 % (ref 39.0–52.0)
Hemoglobin: 15.8 g/dL (ref 13.0–17.0)
Immature Granulocytes: 2 %
Lymphocytes Relative: 23 %
Lymphs Abs: 3.1 10*3/uL (ref 0.7–4.0)
MCH: 33.8 pg (ref 26.0–34.0)
MCHC: 32.6 g/dL (ref 30.0–36.0)
MCV: 103.6 fL — ABNORMAL HIGH (ref 80.0–100.0)
Monocytes Absolute: 0.3 10*3/uL (ref 0.1–1.0)
Monocytes Relative: 2 %
Neutro Abs: 9.6 10*3/uL — ABNORMAL HIGH (ref 1.7–7.7)
Neutrophils Relative %: 73 %
Platelets: 331 10*3/uL (ref 150–400)
RBC: 4.68 MIL/uL (ref 4.22–5.81)
RDW: 12.1 % (ref 11.5–15.5)
WBC: 13.3 10*3/uL — ABNORMAL HIGH (ref 4.0–10.5)
nRBC: 0 % (ref 0.0–0.2)

## 2022-06-18 LAB — POCT I-STAT 7, (LYTES, BLD GAS, ICA,H+H)
Acid-base deficit: 16 mmol/L — ABNORMAL HIGH (ref 0.0–2.0)
Acid-base deficit: 11 mmol/L — ABNORMAL HIGH (ref 0.0–2.0)
Acid-base deficit: 9 mmol/L — ABNORMAL HIGH (ref 0.0–2.0)
Bicarbonate: 10.5 mmol/L — ABNORMAL LOW (ref 20.0–28.0)
Bicarbonate: 14.6 mmol/L — ABNORMAL LOW (ref 20.0–28.0)
Bicarbonate: 15.7 mmol/L — ABNORMAL LOW (ref 20.0–28.0)
Calcium, Ion: 0.98 mmol/L — ABNORMAL LOW (ref 1.15–1.40)
Calcium, Ion: 0.96 mmol/L — ABNORMAL LOW (ref 1.15–1.40)
Calcium, Ion: 0.96 mmol/L — ABNORMAL LOW (ref 1.15–1.40)
HCT: 41 % (ref 39.0–52.0)
HCT: 39 % (ref 39.0–52.0)
HCT: 39 % (ref 39.0–52.0)
Hemoglobin: 13.9 g/dL (ref 13.0–17.0)
Hemoglobin: 13.3 g/dL (ref 13.0–17.0)
Hemoglobin: 13.3 g/dL (ref 13.0–17.0)
O2 Saturation: 93 %
O2 Saturation: 91 %
O2 Saturation: 100 %
Patient temperature: 32
Patient temperature: 34.3
Patient temperature: 37.8
Potassium: 3.4 mmol/L — ABNORMAL LOW (ref 3.5–5.1)
Potassium: 3.3 mmol/L — ABNORMAL LOW (ref 3.5–5.1)
Potassium: 3.5 mmol/L (ref 3.5–5.1)
Sodium: 142 mmol/L (ref 135–145)
Sodium: 147 mmol/L — ABNORMAL HIGH (ref 135–145)
Sodium: 145 mmol/L (ref 135–145)
TCO2: 11 mmol/L — ABNORMAL LOW (ref 22–32)
TCO2: 16 mmol/L — ABNORMAL LOW (ref 22–32)
TCO2: 17 mmol/L — ABNORMAL LOW (ref 22–32)
pCO2 arterial: 22.8 mmHg — ABNORMAL LOW (ref 32–48)
pCO2 arterial: 29.5 mmHg — ABNORMAL LOW (ref 32–48)
pCO2 arterial: 30.5 mmHg — ABNORMAL LOW (ref 32–48)
pH, Arterial: 7.242 — ABNORMAL LOW (ref 7.35–7.45)
pH, Arterial: 7.289 — ABNORMAL LOW (ref 7.35–7.45)
pH, Arterial: 7.324 — ABNORMAL LOW (ref 7.35–7.45)
pO2, Arterial: 59 mmHg — ABNORMAL LOW (ref 83–108)
pO2, Arterial: 58 mmHg — ABNORMAL LOW (ref 83–108)
pO2, Arterial: 284 mmHg — ABNORMAL HIGH (ref 83–108)

## 2022-06-18 LAB — CBG MONITORING, ED
Glucose-Capillary: 81 mg/dL (ref 70–99)
Glucose-Capillary: 81 mg/dL (ref 70–99)
Glucose-Capillary: 112 mg/dL — ABNORMAL HIGH (ref 70–99)

## 2022-06-18 LAB — RAPID URINE DRUG SCREEN, HOSP PERFORMED
Amphetamines: NOT DETECTED
Barbiturates: NOT DETECTED
Benzodiazepines: NOT DETECTED
Cocaine: NOT DETECTED
Opiates: NOT DETECTED
Tetrahydrocannabinol: NOT DETECTED

## 2022-06-18 LAB — I-STAT VENOUS BLOOD GAS, ED
Acid-base deficit: 16 mmol/L — ABNORMAL HIGH (ref 0.0–2.0)
Acid-base deficit: 16 mmol/L — ABNORMAL HIGH (ref 0.0–2.0)
Bicarbonate: 14 mmol/L — ABNORMAL LOW (ref 20.0–28.0)
Bicarbonate: 14 mmol/L — ABNORMAL LOW (ref 20.0–28.0)
Calcium, Ion: 0.95 mmol/L — ABNORMAL LOW (ref 1.15–1.40)
Calcium, Ion: 0.95 mmol/L — ABNORMAL LOW (ref 1.15–1.40)
HCT: 49 % (ref 39.0–52.0)
HCT: 49 % (ref 39.0–52.0)
Hemoglobin: 16.7 g/dL (ref 13.0–17.0)
Hemoglobin: 16.7 g/dL (ref 13.0–17.0)
O2 Saturation: 95 %
O2 Saturation: 95 %
Potassium: 4.2 mmol/L (ref 3.5–5.1)
Potassium: 4.2 mmol/L (ref 3.5–5.1)
Sodium: 146 mmol/L — ABNORMAL HIGH (ref 135–145)
Sodium: 146 mmol/L — ABNORMAL HIGH (ref 135–145)
TCO2: 15 mmol/L — ABNORMAL LOW (ref 22–32)
TCO2: 15 mmol/L — ABNORMAL LOW (ref 22–32)
pCO2, Ven: 47.3 mmHg (ref 44–60)
pCO2, Ven: 47.3 mmHg (ref 44–60)
pH, Ven: 7.08 — CL (ref 7.25–7.43)
pH, Ven: 7.08 — CL (ref 7.25–7.43)
pO2, Ven: 102 mmHg — ABNORMAL HIGH (ref 32–45)
pO2, Ven: 102 mmHg — ABNORMAL HIGH (ref 32–45)

## 2022-06-18 LAB — I-STAT ARTERIAL BLOOD GAS, ED
Acid-base deficit: 18 mmol/L — ABNORMAL HIGH (ref 0.0–2.0)
Acid-base deficit: 18 mmol/L — ABNORMAL HIGH (ref 0.0–2.0)
Bicarbonate: 13.3 mmol/L — ABNORMAL LOW (ref 20.0–28.0)
Bicarbonate: 13.3 mmol/L — ABNORMAL LOW (ref 20.0–28.0)
Calcium, Ion: 1.07 mmol/L — ABNORMAL LOW (ref 1.15–1.40)
Calcium, Ion: 1.07 mmol/L — ABNORMAL LOW (ref 1.15–1.40)
HCT: 47 % (ref 39.0–52.0)
HCT: 47 % (ref 39.0–52.0)
Hemoglobin: 16 g/dL (ref 13.0–17.0)
Hemoglobin: 16 g/dL (ref 13.0–17.0)
O2 Saturation: 100 %
O2 Saturation: 100 %
Patient temperature: 97.5
Patient temperature: 97.5
Potassium: 2.9 mmol/L — ABNORMAL LOW (ref 3.5–5.1)
Potassium: 2.9 mmol/L — ABNORMAL LOW (ref 3.5–5.1)
Sodium: 146 mmol/L — ABNORMAL HIGH (ref 135–145)
Sodium: 146 mmol/L — ABNORMAL HIGH (ref 135–145)
TCO2: 15 mmol/L — ABNORMAL LOW (ref 22–32)
TCO2: 15 mmol/L — ABNORMAL LOW (ref 22–32)
pCO2 arterial: 48.3 mmHg — ABNORMAL HIGH (ref 32–48)
pCO2 arterial: 48.3 mmHg — ABNORMAL HIGH (ref 32–48)
pH, Arterial: 7.044 — CL (ref 7.35–7.45)
pH, Arterial: 7.044 — CL (ref 7.35–7.45)
pO2, Arterial: 323 mmHg — ABNORMAL HIGH (ref 83–108)
pO2, Arterial: 323 mmHg — ABNORMAL HIGH (ref 83–108)

## 2022-06-18 LAB — TROPONIN I (HIGH SENSITIVITY)
Troponin I (High Sensitivity): 8 ng/L (ref <18)
Troponin I (High Sensitivity): 10 ng/L (ref <18)

## 2022-06-18 LAB — I-STAT CHEM 8, ED
BUN: 14 mg/dL (ref 6–20)
BUN: 14 mg/dL (ref 6–20)
Calcium, Ion: 0.93 mmol/L — ABNORMAL LOW (ref 1.15–1.40)
Calcium, Ion: 0.93 mmol/L — ABNORMAL LOW (ref 1.15–1.40)
Chloride: 114 mmol/L — ABNORMAL HIGH (ref 98–111)
Chloride: 114 mmol/L — ABNORMAL HIGH (ref 98–111)
Creatinine, Ser: 1.8 mg/dL — ABNORMAL HIGH (ref 0.61–1.24)
Creatinine, Ser: 1.8 mg/dL — ABNORMAL HIGH (ref 0.61–1.24)
Glucose, Bld: 85 mg/dL (ref 70–99)
Glucose, Bld: 85 mg/dL (ref 70–99)
HCT: 49 % (ref 39.0–52.0)
HCT: 49 % (ref 39.0–52.0)
Hemoglobin: 16.7 g/dL (ref 13.0–17.0)
Hemoglobin: 16.7 g/dL (ref 13.0–17.0)
Potassium: 4.2 mmol/L (ref 3.5–5.1)
Potassium: 4.2 mmol/L (ref 3.5–5.1)
Sodium: 146 mmol/L — ABNORMAL HIGH (ref 135–145)
Sodium: 146 mmol/L — ABNORMAL HIGH (ref 135–145)
TCO2: 18 mmol/L — ABNORMAL LOW (ref 22–32)
TCO2: 18 mmol/L — ABNORMAL LOW (ref 22–32)

## 2022-06-18 LAB — URINALYSIS, ROUTINE W REFLEX MICROSCOPIC
Bacteria, UA: NONE SEEN
Bilirubin Urine: NEGATIVE
Glucose, UA: NEGATIVE mg/dL
Ketones, ur: 20 mg/dL — AB
Leukocytes,Ua: NEGATIVE
Nitrite: NEGATIVE
Protein, ur: 100 mg/dL — AB
Specific Gravity, Urine: 1.013 (ref 1.005–1.030)
pH: 5 (ref 5.0–8.0)

## 2022-06-18 LAB — BASIC METABOLIC PANEL
Anion gap: 22 — ABNORMAL HIGH (ref 5–15)
BUN: 9 mg/dL (ref 6–20)
CO2: 16 mmol/L — ABNORMAL LOW (ref 22–32)
Calcium: 7.3 mg/dL — ABNORMAL LOW (ref 8.9–10.3)
Chloride: 108 mmol/L (ref 98–111)
Creatinine, Ser: 0.92 mg/dL (ref 0.61–1.24)
GFR, Estimated: >60 mL/min (ref >60)
Glucose, Bld: 199 mg/dL — ABNORMAL HIGH (ref 70–99)
Potassium: 3.4 mmol/L — ABNORMAL LOW (ref 3.5–5.1)
Sodium: 146 mmol/L — ABNORMAL HIGH (ref 135–145)

## 2022-06-18 LAB — ETHANOL: Alcohol, Ethyl (B): >600 mg/dL (ref <10)

## 2022-06-18 LAB — GLUCOSE, CAPILLARY
Glucose-Capillary: 133 mg/dL — ABNORMAL HIGH (ref 70–99)
Glucose-Capillary: 190 mg/dL — ABNORMAL HIGH (ref 70–99)
Glucose-Capillary: 209 mg/dL — ABNORMAL HIGH (ref 70–99)

## 2022-06-18 LAB — PROTIME-INR
INR: 1.2 (ref 0.8–1.2)
Prothrombin Time: 15.1 seconds (ref 11.4–15.2)

## 2022-06-18 LAB — COMPREHENSIVE METABOLIC PANEL
ALT: 23 U/L (ref 0–44)
AST: 45 U/L — ABNORMAL HIGH (ref 15–41)
Albumin: 3.8 g/dL (ref 3.5–5.0)
Alkaline Phosphatase: 58 U/L (ref 38–126)
Anion gap: 24 — ABNORMAL HIGH (ref 5–15)
BUN: 11 mg/dL (ref 6–20)
CO2: 12 mmol/L — ABNORMAL LOW (ref 22–32)
Calcium: 8.2 mg/dL — ABNORMAL LOW (ref 8.9–10.3)
Chloride: 110 mmol/L (ref 98–111)
Creatinine, Ser: 1.24 mg/dL (ref 0.61–1.24)
GFR, Estimated: >60 mL/min (ref >60)
Glucose, Bld: 85 mg/dL (ref 70–99)
Potassium: 3.5 mmol/L (ref 3.5–5.1)
Sodium: 146 mmol/L — ABNORMAL HIGH (ref 135–145)
Total Bilirubin: 0.3 mg/dL (ref 0.3–1.2)
Total Protein: 7 g/dL (ref 6.5–8.1)

## 2022-06-18 LAB — LACTIC ACID, PLASMA
Lactic Acid, Venous: 5.4 mmol/L (ref 0.5–1.9)
Lactic Acid, Venous: 7.3 mmol/L (ref 0.5–1.9)
Lactic Acid, Venous: 7.4 mmol/L (ref 0.5–1.9)
Lactic Acid, Venous: 6.5 mmol/L (ref 0.5–1.9)

## 2022-06-18 LAB — LIPASE, BLOOD: Lipase: 32 U/L (ref 11–51)

## 2022-06-18 LAB — CK: Total CK: 830 U/L — ABNORMAL HIGH (ref 49–397)

## 2022-06-18 LAB — HIV ANTIBODY (ROUTINE TESTING W REFLEX): HIV Screen 4th Generation wRfx: NONREACTIVE

## 2022-06-18 LAB — MRSA NEXT GEN BY PCR, NASAL: MRSA by PCR Next Gen: NOT DETECTED

## 2022-06-18 LAB — AMMONIA: Ammonia: 57 umol/L — ABNORMAL HIGH (ref 9–35)

## 2022-06-18 LAB — PROCALCITONIN: Procalcitonin: 0.13 ng/mL

## 2022-06-18 MED ORDER — VANCOMYCIN HCL 1750 MG/350ML IV SOLN
1750.0000 mg | Freq: Once | INTRAVENOUS | Status: DC
  Filled 2022-06-18: qty 350

## 2022-06-18 MED ORDER — LORAZEPAM 2 MG/ML IJ SOLN: 1.0000 mg | INTRAMUSCULAR | Status: DC | PRN

## 2022-06-18 MED ORDER — KETAMINE HCL 50 MG/5ML IJ SOSY
PREFILLED_SYRINGE | INTRAMUSCULAR | Status: AC
  Filled 2022-06-18: qty 10

## 2022-06-18 MED ORDER — DOCUSATE SODIUM 50 MG/5ML PO LIQD: 100.0000 mg | Freq: Two times a day (BID) | ORAL | Status: DC | PRN

## 2022-06-18 MED ORDER — KETAMINE HCL 10 MG/ML IJ SOLN
INTRAMUSCULAR | Status: DC | PRN
  Administered 2022-06-18: 100 mg via INTRAVENOUS

## 2022-06-18 MED ORDER — ROCURONIUM BROMIDE 50 MG/5ML IV SOLN
INTRAVENOUS | Status: DC | PRN
  Administered 2022-06-18: 100 mg via INTRAVENOUS

## 2022-06-18 MED ORDER — LORAZEPAM 1 MG PO TABS
1.0000 mg | ORAL_TABLET | ORAL | Status: DC | PRN
  Administered 2022-06-19: 2 mg via ORAL
  Administered 2022-06-20: 1 mg via ORAL
  Filled 2022-06-18: qty 2
  Filled 2022-06-18: qty 1
  Filled 2022-06-18: qty 2

## 2022-06-18 MED ORDER — NOREPINEPHRINE 4 MG/250ML-% IV SOLN
0.0000 ug/min | INTRAVENOUS | Status: DC
  Administered 2022-06-18: 8 ug/min via INTRAVENOUS
  Administered 2022-06-18: 6 ug/min via INTRAVENOUS
  Administered 2022-06-18: 5 ug/min via INTRAVENOUS
  Filled 2022-06-18 (×3): qty 250

## 2022-06-18 MED ORDER — INSULIN ASPART 100 UNIT/ML IJ SOLN
0.0000 [IU] | INTRAMUSCULAR | Status: DC
  Administered 2022-06-18: 1 [IU] via SUBCUTANEOUS
  Administered 2022-06-19 (×2): 2 [IU] via SUBCUTANEOUS

## 2022-06-18 MED ORDER — ORAL CARE MOUTH RINSE: 15.0000 mL | OROMUCOSAL | Status: DC | PRN

## 2022-06-18 MED ORDER — THIAMINE HCL 100 MG/ML IJ SOLN
100.0000 mg | Freq: Once | INTRAMUSCULAR | Status: AC
  Administered 2022-06-18: 100 mg via INTRAVENOUS
  Filled 2022-06-18: qty 2

## 2022-06-18 MED ORDER — VASOPRESSIN 20 UNITS/100 ML INFUSION FOR SHOCK
0.0400 [IU]/min | INTRAVENOUS | Status: DC
  Administered 2022-06-18 – 2022-06-19 (×3): 0.04 [IU]/min via INTRAVENOUS
  Filled 2022-06-18 (×4): qty 100

## 2022-06-18 MED ORDER — FENTANYL CITRATE PF 50 MCG/ML IJ SOSY
50.0000 ug | PREFILLED_SYRINGE | INTRAMUSCULAR | Status: DC | PRN
  Administered 2022-06-18 (×2): 100 ug via INTRAVENOUS
  Administered 2022-06-19: 200 ug via INTRAVENOUS
  Administered 2022-06-19: 100 ug via INTRAVENOUS
  Administered 2022-06-19 (×2): 200 ug via INTRAVENOUS
  Administered 2022-06-19: 100 ug via INTRAVENOUS
  Filled 2022-06-18 (×3): qty 2

## 2022-06-18 MED ORDER — LACTATED RINGERS IV BOLUS
3000.0000 mL | Freq: Once | INTRAVENOUS | Status: AC
  Administered 2022-06-18: 3000 mL via INTRAVENOUS

## 2022-06-18 MED ORDER — NALOXONE HCL 2 MG/2ML IJ SOSY
1.0000 mg | PREFILLED_SYRINGE | Freq: Once | INTRAMUSCULAR | Status: AC
  Administered 2022-06-18: 1 mg via INTRAVENOUS

## 2022-06-18 MED ORDER — DOCUSATE SODIUM 100 MG PO CAPS: 100.0000 mg | ORAL_CAPSULE | Freq: Two times a day (BID) | ORAL | Status: DC | PRN

## 2022-06-18 MED ORDER — SODIUM CHLORIDE 0.9 % IV BOLUS
1000.0000 mL | Freq: Once | INTRAVENOUS | Status: AC
  Administered 2022-06-18: 1000 mL via INTRAVENOUS

## 2022-06-18 MED ORDER — MIDAZOLAM HCL 2 MG/2ML IJ SOLN
1.0000 mg | INTRAMUSCULAR | Status: DC | PRN
  Administered 2022-06-19 (×2): 2 mg via INTRAVENOUS
  Filled 2022-06-18 (×2): qty 2

## 2022-06-18 MED ORDER — POLYETHYLENE GLYCOL 3350 17 G PO PACK: 17.0000 g | PACK | Freq: Every day | ORAL | Status: DC | PRN

## 2022-06-18 MED ORDER — SODIUM CHLORIDE 0.9 % IV SOLN
3.0000 g | Freq: Two times a day (BID) | INTRAVENOUS | Status: DC
  Administered 2022-06-18: 3 g via INTRAVENOUS
  Filled 2022-06-18: qty 8

## 2022-06-18 MED ORDER — FAMOTIDINE 20 MG PO TABS
20.0000 mg | ORAL_TABLET | Freq: Two times a day (BID) | ORAL | Status: DC
  Administered 2022-06-18 – 2022-06-19 (×3): 20 mg
  Filled 2022-06-18 (×3): qty 1

## 2022-06-18 MED ORDER — LEVETIRACETAM 500 MG PO TABS
500.0000 mg | ORAL_TABLET | Freq: Two times a day (BID) | ORAL | Status: DC
  Administered 2022-06-18 – 2022-06-19 (×3): 500 mg
  Filled 2022-06-18 (×3): qty 1

## 2022-06-18 MED ORDER — FENTANYL CITRATE PF 50 MCG/ML IJ SOSY: 50.0000 ug | PREFILLED_SYRINGE | INTRAMUSCULAR | Status: DC | PRN

## 2022-06-18 MED ORDER — POLYETHYLENE GLYCOL 3350 17 G PO PACK
17.0000 g | PACK | Freq: Every day | ORAL | Status: DC
  Administered 2022-06-18 – 2022-06-19 (×2): 17 g
  Filled 2022-06-18 (×2): qty 1

## 2022-06-18 MED ORDER — SODIUM BICARBONATE 8.4 % IV SOLN
100.0000 meq | Freq: Once | INTRAVENOUS | Status: AC
  Administered 2022-06-18: 100 meq via INTRAVENOUS
  Filled 2022-06-18: qty 100

## 2022-06-18 MED ORDER — ACETAMINOPHEN 160 MG/5ML PO SOLN
650.0000 mg | ORAL | Status: DC | PRN
  Administered 2022-06-19: 650 mg
  Filled 2022-06-18: qty 20.3

## 2022-06-18 MED ORDER — ADULT MULTIVITAMIN W/MINERALS CH
1.0000 | ORAL_TABLET | Freq: Every day | ORAL | Status: DC
  Administered 2022-06-18 – 2022-06-19 (×2): 1
  Filled 2022-06-18 (×2): qty 1

## 2022-06-18 MED ORDER — SODIUM BICARBONATE 8.4 % IV SOLN
INTRAVENOUS | Status: DC
  Filled 2022-06-18 (×3): qty 1000

## 2022-06-18 MED ORDER — DOCUSATE SODIUM 50 MG/5ML PO LIQD
100.0000 mg | Freq: Two times a day (BID) | ORAL | Status: DC
  Administered 2022-06-18 – 2022-06-19 (×3): 100 mg
  Filled 2022-06-18 (×3): qty 10

## 2022-06-18 MED ORDER — THIAMINE HCL 100 MG/ML IJ SOLN: 100.0000 mg | Freq: Every day | INTRAMUSCULAR | Status: DC

## 2022-06-18 MED ORDER — ORAL CARE MOUTH RINSE
15.0000 mL | OROMUCOSAL | Status: DC
  Administered 2022-06-18 – 2022-06-19 (×9): 15 mL via OROMUCOSAL

## 2022-06-18 MED ORDER — THIAMINE MONONITRATE 100 MG PO TABS
100.0000 mg | ORAL_TABLET | Freq: Every day | ORAL | Status: DC
  Administered 2022-06-19: 100 mg
  Filled 2022-06-18 (×2): qty 1

## 2022-06-18 MED ORDER — FOLIC ACID 1 MG PO TABS
1.0000 mg | ORAL_TABLET | Freq: Every day | ORAL | Status: DC
  Administered 2022-06-18 – 2022-06-19 (×2): 1 mg
  Filled 2022-06-18 (×2): qty 1

## 2022-06-18 MED ORDER — SODIUM CHLORIDE 0.9 % IV SOLN
2.0000 g | Freq: Once | INTRAVENOUS | Status: AC
  Administered 2022-06-18: 2 g via INTRAVENOUS

## 2022-06-18 NOTE — Procedures (Signed)
Central Venous Catheter Insertion Procedure Note  Brett Molina  [id number]  09-13-1990  Date:06/18/22  Time:4:35 PM   Provider Performing:Dorna Mallet Erby Pian   Procedure: Insertion of Non-tunneled Central Venous Catheter(36556) with US guidance (61224)   Indication(s) Medication administration  Consent Unable to obtain consent due to emergent nature of procedure.  Anesthesia Topical only with 1% lidocaine   Timeout Verified patient identification, verified procedure, site/side was marked, verified correct patient position, special equipment/implants available, medications/allergies/relevant history reviewed, required imaging and test results available.  Sterile Technique Maximal sterile technique including full sterile barrier drape, hand hygiene, sterile gown, sterile gloves, mask, hair covering, sterile ultrasound probe cover (if used).  Procedure Description Area of catheter insertion was cleaned with chlorhexidine and draped in sterile fashion.  With real-time ultrasound guidance a central venous catheter was placed into the left internal jugular vein. Nonpulsatile blood flow and easy flushing noted in all ports.  The catheter was sutured in place and sterile dressing applied.  Complications/Tolerance None; patient tolerated the procedure well. Chest X-ray is ordered to verify placement for internal jugular or subclavian cannulation.   Chest x-ray is not ordered for femoral cannulation.  EBL Minimal  Specimen(s) None

## 2022-06-18 NOTE — Progress Notes (Signed)
Pt transported to 3M13 from ED on ventilator w/o any complications.

## 2022-06-18 NOTE — ED Notes (Signed)
PT to ct with this RN and RT

## 2022-06-18 NOTE — ED Notes (Signed)
Pt returns from CT.

## 2022-06-18 NOTE — H&P (Signed)
NAME:  Brett Molina, MRN:  [id number], DOB:  12-10-1990, LOS: 0 ADMISSION DATE:  06/18/2022, CONSULTATION DATE:  06/18/22 REFERRING MD:  Gwenlyn Fudge, CHIEF COMPLAINT:  found down   History of Present Illness:  31 year old man w/ hx of alcohol dependence and w/d seizures presenting after being found down.  Was at ER yesterday for w/d symptoms but did not want to stay, worried about losing job.  No loss of pulse.  Intubated for airway protection. Acidemic, hypotensive, and hypothermic in ER.  Probable aspiration on CXR. Labs trickling in, head scan pending, PCCM asked to admit.  Pertinent  Medical History  HTN EtOH dependence DTs  Significant Hospital Events: Including procedures, antibiotic start and stop dates in addition to other pertinent events   06/18/22 admit  Interim History / Subjective:  Admit  Objective   Blood pressure (!) 117/49, pulse 70, temperature (!) 79.5 F (26.4 C), resp. rate 16, height 6\' 2"  (1.88 m), SpO2 97 %.    Vent Mode: PRVC FiO2 (%):  [100 %] 100 % Set Rate:  [16 bmp] 16 bmp Vt Set:  [500 mL-550 mL] 550 mL PEEP:  [5 cmH20] 5 cmH20   Intake/Output Summary (Last 24 hours) at 06/18/2022 1211 Last data filed at 06/18/2022 1113 Gross per 24 hour  Intake 500 ml  Output --  Net 500 ml   There were no vitals filed for this visit.  Examination: General: ill appearing young man unresponsive HENT: pupils ~75mm, poorly reactive to light Lungs: rhonci worse on R, passive on vent Cardiovascular: RRR, ext cool Abdomen: soft, hypoactive BS Extremities: no edema or gross deformities Neuro: GCS3 Skin: no rashes  Labs pending  Resolved Hospital Problem list   N/A  Assessment & Plan:  Coma, hypothermia, hypotension, hx EtOH abuse, found down, aspiration pneumonitis  Social stressors  A seizure followed by prolonged downtime and aspiration seems likely.  Labs are pending.  - Vent bundle - Active rewarming with warmed crystalloid and bair  hugger - f/u admit labs - f/u CT head - EEG - CIWA driven ativan - Thiamine/folate - Trops/echo - Bicarb gtt, increase mandatory rate on vent (current pH 7.0) - Unasyn, check Pct and tracheal aspirate  Best Practice (right click and "Reselect all SmartList Selections" daily)   Diet/type: NPO DVT prophylaxis: SCD GI prophylaxis: H2B Lines: Central line Foley:  Yes, and it is still needed Code Status:  full code Last date of multidisciplinary goals of care discussion [I called the numbers listed for brother and spouse, no answer]  Labs   CBC: Recent Labs  Lab 06/18/22 1110  WBC 13.3*  NEUTROABS 9.6*  HGB 15.8  HCT 48.5  MCV 103.6*  PLT 331    Basic Metabolic Panel: No results for input(s): "NA", "K", "CL", "CO2", "GLUCOSE", "BUN", "CREATININE", "CALCIUM", "MG", "PHOS" in the last 168 hours. GFR: CrCl cannot be calculated (Patient's most recent lab result is older than the maximum 21 days allowed.). Recent Labs  Lab 06/18/22 1110  WBC 13.3*    Liver Function Tests: No results for input(s): "AST", "ALT", "ALKPHOS", "BILITOT", "PROT", "ALBUMIN" in the last 168 hours. No results for input(s): "LIPASE", "AMYLASE" in the last 168 hours. No results for input(s): "AMMONIA" in the last 168 hours.  ABG    Component Value Date/Time   TCO2 26 03/28/2017 0443     Coagulation Profile: No results for input(s): "INR", "PROTIME" in the last 168 hours.  Cardiac Enzymes: No results for input(s): "CKTOTAL", "CKMB", "CKMBINDEX", "  TROPONINI" in the last 168 hours.  HbA1C: Hgb A1c MFr Bld  Date/Time Value Ref Range Status  11/20/2018 04:32 PM 5.7 (H) 4.8 - 5.6 % Final    Comment:    (NOTE) Pre diabetes:          5.7%-6.4% Diabetes:              >6.4% Glycemic control for   <7.0% adults with diabetes   12/20/2016 03:27 AM 5.3 4.8 - 5.6 % Final    Comment:    (NOTE)         Pre-diabetes: 5.7 - 6.4         Diabetes: >6.4         Glycemic control for adults with  diabetes: <7.0     CBG: No results for input(s): "GLUCAP" in the last 168 hours.  Review of Systems:   Coma  Past Medical History:  He,  has a past medical history of ETOH abuse and Hypertension.   Surgical History:   Past Surgical History:  Procedure Laterality Date   ESOPHAGOGASTRODUODENOSCOPY N/A 07/24/2018   Procedure: ESOPHAGOGASTRODUODENOSCOPY (EGD);  Surgeon: Rachael Fee, MD;  Location: [facility];  Service: Endoscopy;  Laterality: N/A;     Social History:   reports that he has never smoked. He has never used smokeless tobacco. He reports current alcohol use of about 21.0 standard drinks of alcohol per week. He reports that he does not use drugs.   Family History:  His family history includes Hypertension in his father and mother.   Allergies No Known Allergies   Home Medications  Prior to Admission medications   Medication Sig Start Date End Date Taking? Authorizing Provider  amLODipine (NORVASC) 10 MG tablet Take 1 tablet (10 mg total) by mouth daily. 10/08/21 11/07/21  Glade Lloyd, MD  chlordiazePOXIDE (LIBRIUM) 25 MG capsule 50mg  PO TID x 1D, then 25-50mg  PO BID X 1D, then 25-50mg  PO QD X 1D 12/01/21   12/03/21, MD  folic acid (FOLVITE) 1 MG tablet Take 1 tablet (1 mg total) by mouth daily. 10/09/21   12/09/21, MD  levETIRAcetam (KEPPRA) 500 MG tablet Take 1 tablet (500 mg total) by mouth 2 (two) times daily. 10/08/21 11/07/21  01/07/22, MD  lisinopril (ZESTRIL) 20 MG tablet Take 1 tablet (20 mg total) by mouth daily. 10/08/21 11/07/21  01/07/22, MD  Multiple Vitamin (MULTIVITAMIN WITH MINERALS) TABS tablet Take 1 tablet by mouth daily. 10/09/21   12/09/21, MD  ondansetron (ZOFRAN) 4 MG tablet Take 1 tablet (4 mg total) by mouth every 8 (eight) hours as needed for nausea or vomiting. 12/01/21   12/03/21, MD  thiamine 100 MG tablet Take 1 tablet (100 mg total) by mouth daily. 10/09/21   12/09/21, MD     Critical care time: 33  mins independent of procedures

## 2022-06-18 NOTE — Progress Notes (Signed)
Echocardiogram 2D Echocardiogram has been attempted. MD placing central line and asked to postpone echo until tomorrow. Will attempt again 06/19/22.  Augustine Radar 06/18/2022, 2:38 PM

## 2022-06-18 NOTE — ED Notes (Signed)
CRITICAL VALUE STICKER  CRITICAL VALUE:lactic acid 5.4, etoh>600  RECEIVER (on-site recipient of call):I.Breton Berns  DATE & TIME NOTIFIED: 06/18/22 1257  MESSENGER (representative from lab):lab  MD NOTIFIED: Dr. Katrinka Blazing   TIME OF NOTIFICATION:1300  RESPONSE:

## 2022-06-18 NOTE — Progress Notes (Signed)
Pt transported to CT1 and back on the ventilator w/o any complications.

## 2022-06-18 NOTE — Procedures (Signed)
Routine EEG Report  Brett Molina is a 31 y.o. male with a history of seizure who is undergoing an EEG to evaluate for seizures.  Report: This EEG was acquired with electrodes placed according to the International 10-20 electrode system (including Fp1, Fp2, F3, F4, C3, C4, P3, P4, O1, O2, T3, T4, T5, T6, A1, A2, Fz, Cz, Pz). The following electrodes were missing or displaced: none.  The best background was approx 4 Hz. This activity is reactive to stimulation. Sleep architecture was not identified. There was no focal slowing. There were no interictal epileptiform discharges. There were no electrographic seizures identified. Photic stimulation and hyperventilation were not performed.  Impression and clinical correlation: This EEG was obtained while comatose and is abnormal due to severe diffuse slowing indicative of global cerebral dysfunction. Epileptiform abnormalities were not seen during this recording.  Bing Neighbors, MD [facility] [phone]  If 7pm- 7am, please page neurology on call as listed in AMION.

## 2022-06-18 NOTE — ED Notes (Signed)
All ivf administered are warmed ivf

## 2022-06-18 NOTE — ED Notes (Signed)
Dr.Goldston given copy of ABG and Chem8 lab results.

## 2022-06-18 NOTE — Progress Notes (Signed)
Pharmacy Antibiotic Note  Brett Molina is a 31 y.o. male admitted on 06/18/2022 presenting in respiratory distress, unresponsive, concern for aspiration.  Pharmacy has been consulted for Unasyn dosing.  Est normalized CrCl ~60 ml/min. Pt with 2300 ml UOP.  Plan: Change Unasyn to 3g IV every 6 hours Monitor renal function, LOT  Height: 6\' 2"  (188 cm) IBW/kg (Calculated) : 82.2  Temp (24hrs), Avg:80.6 F (27 C), Min:79.5 F (26.4 C), Max:85.6 F (29.8 C)  Recent Labs  Lab 06/18/22 1110 06/18/22 1117 06/18/22 1309  WBC 13.3*  --   --   CREATININE 1.24 1.80*  1.80*  --   LATICACIDVEN 5.4*  --  7.3*     CrCl cannot be calculated (Unknown ideal weight.).    No Known Allergies  06/20/22, PharmD, BCPS Please see amion for complete clinical pharmacist phone list 06/18/2022 3:42 PM

## 2022-06-18 NOTE — Progress Notes (Signed)
eLink Physician-Brief Progress Note Patient Name: Brett Molina DOB: Nov 09, 1990 MRN: [id number]   Date of Service  06/18/2022  HPI/Events of Note  Patient needs PRN Tylenol order.  eICU Interventions  PRN Tylenol ordered.        Thomasene Lot Kida Digiulio 06/18/2022, 10:20 PM

## 2022-06-18 NOTE — Progress Notes (Signed)
Pharmacy Antibiotic Note  Brett Molina is a 31 y.o. male admitted on 06/18/2022 presenting in respiratory distress, unresponsive, concern for aspiration.  Pharmacy has been consulted for Unasyn dosing.  Plan: Unasyn 3g IV every 12 hours Monitor renal function, LOT  Height: 6\' 2"  (188 cm) IBW/kg (Calculated) : 82.2  Temp (24hrs), Avg:79.8 F (26.6 C), Min:79.5 F (26.4 C), Max:80.7 F (27.1 C)  Recent Labs  Lab 06/18/22 1110 06/18/22 1117  WBC 13.3*  --   CREATININE  --  1.80*  1.80*    CrCl cannot be calculated (Unknown ideal weight.).    No Known Allergies  06/20/22, PharmD, [facility] Clinical Pharmacist ED Pharmacist Phone # [phone] 06/18/2022 12:52 PM

## 2022-06-18 NOTE — ED Notes (Signed)
Bair Hugger applied

## 2022-06-18 NOTE — ED Triage Notes (Signed)
PT arrives via EMS after being found beside a sidewalk in the grass. Fire or PD found him unrsponsive with a gcs of 3. 2mg  of narcan was administered at that time with minimal response. Pt was given rescue breaths as well. Upon paramedic arrival, another 2mg  of narcan was given. Resp rate was 6. After the narcan, resp increased. Pt was hypotensive, bradycardic, and hypoglycemic with a bg 72.

## 2022-06-18 NOTE — ED Notes (Signed)
Dr.Goldston shown results of Istat VBG. ED-Lab.

## 2022-06-18 NOTE — Progress Notes (Signed)
EEG complete - results pending 

## 2022-06-18 NOTE — Progress Notes (Signed)
ABG obtained on ventilator settings of VT: 550, RR: 28, FIO2: 50%, and PEEP: 5.  Results given to MD.  Increased PEEP to 8.  No further vent changes made at this time.    Latest Reference Range & Units 06/18/22 16:37  Sample type  ARTERIAL  pH, Arterial 7.35 - 7.45  7.242 (L)  pCO2 arterial 32 - 48 mmHg 22.8 (L)  pO2, Arterial 83 - 108 mmHg 59 (L)  TCO2 22 - 32 mmol/L 11 (L)  Acid-base deficit 0.0 - 2.0 mmol/L 16.0 (H)  Bicarbonate 20.0 - 28.0 mmol/L 10.5 (L)  O2 Saturation % 93  Patient temperature  32.0 C

## 2022-06-18 NOTE — ED Provider Notes (Signed)
[facility] EMERGENCY DEPARTMENT Provider Note   CSN: [id number] Arrival date & time: 06/18/22  1104     History  Chief Complaint  Patient presents with   Unresponsive    Brett Molina is a 31 y.o. male with past medical history significant for hypertension, EtOH use, EtOH withdrawal, depression, prior GI bleed presenting to EMS after being found unresponsive.  Per EMS, patient was GCS 3.  He was given 2 mg Narcan with minimal response.  They provided rescue breaths and a another 2 mg Narcan and had slight improvement in respiratory rate.  EMS noted he had a large empty alcohol bottle next to him.  On arrival, patient is unresponsive.       Home Medications Prior to Admission medications   Medication Sig Start Date End Date Taking? Authorizing Provider  amLODipine (NORVASC) 10 MG tablet Take 1 tablet (10 mg total) by mouth daily. 10/08/21 11/07/21  Aline August, MD  chlordiazePOXIDE (LIBRIUM) 25 MG capsule 34m PO TID x 1D, then 25-589mPO BID X 1D, then 25-5032mO QD X 1D 12/01/21   ButHayden RasmussenD  folic acid (FOLVITE) 1 MG tablet Take 1 tablet (1 mg total) by mouth daily. 10/09/21   AleAline AugustD  levETIRAcetam (KEPPRA) 500 MG tablet Take 1 tablet (500 mg total) by mouth 2 (two) times daily. 10/08/21 11/07/21  AleAline AugustD  lisinopril (ZESTRIL) 20 MG tablet Take 1 tablet (20 mg total) by mouth daily. 10/08/21 11/07/21  AleAline AugustD  Multiple Vitamin (MULTIVITAMIN WITH MINERALS) TABS tablet Take 1 tablet by mouth daily. 10/09/21   AleAline AugustD  ondansetron (ZOFRAN) 4 MG tablet Take 1 tablet (4 mg total) by mouth every 8 (eight) hours as needed for nausea or vomiting. 12/01/21   ButHayden RasmussenD  thiamine 100 MG tablet Take 1 tablet (100 mg total) by mouth daily. 10/09/21   AleAline AugustD      Allergies    Patient has no known allergies.    Review of Systems   Review of Systems  Physical Exam Updated Vital Signs BP (!) 107/32    Pulse 67   Temp (!) 79.5 F (26.4 C)   Resp 16   Ht _0  (1.88 m)   SpO2 97%   BMI 29.53 kg/m  Physical Exam Constitutional:      General: He is in acute distress.     Comments: Unresponsive  HENT:     Head:     Comments: Swelling and abrasion of lower lip, no other obvious injuries noted Cardiovascular:     Rate and Rhythm: Normal rate and regular rhythm.  Pulmonary:     Comments: Bradypneic.  Lung sounds present in anterior fields bilaterally. Abdominal:     General: There is no distension.  Musculoskeletal:     Right lower leg: No edema.     Left lower leg: No edema.  Skin:    Comments: Cool to touch     ED Results / Procedures / Treatments   Labs (all labs ordered are listed, but only abnormal results are displayed) Labs Reviewed  CULTURE, BLOOD (ROUTINE X 2)  CULTURE, BLOOD (ROUTINE X 2)  URINE CULTURE  RESP PANEL BY RT-PCR (RSV, FLU A&B, COVID)  RVPGX2  CBC WITH DIFFERENTIAL/PLATELET  COMPREHENSIVE METABOLIC PANEL  LACTIC ACID, PLASMA  LACTIC ACID, PLASMA  AMMONIA  ETHANOL  PROTIME-INR  RAPID URINE DRUG SCREEN, HOSP PERFORMED  LIPASE, BLOOD  URINALYSIS, ROUTINE W  REFLEX MICROSCOPIC  I-STAT VENOUS BLOOD GAS, ED  CBG MONITORING, ED  I-STAT CHEM 8, ED  CBG MONITORING, ED  I-STAT ARTERIAL BLOOD GAS, ED  TROPONIN I (HIGH SENSITIVITY)    EKG None  Radiology CT HEAD WO CONTRAST (5MM)  Result Date: 06/17/2022 CLINICAL DATA:  Intoxication with fall and facial injury EXAM: CT HEAD WITHOUT CONTRAST CT MAXILLOFACIAL WITHOUT CONTRAST CT CERVICAL SPINE WITHOUT CONTRAST TECHNIQUE: Multidetector CT imaging of the head, cervical spine, and maxillofacial structures were performed using the standard protocol without intravenous contrast. Multiplanar CT image reconstructions of the cervical spine and maxillofacial structures were also generated. RADIATION DOSE REDUCTION: This exam was performed according to the departmental dose-optimization program which includes  automated exposure control, adjustment of the mA and/or kV according to patient size and/or use of iterative reconstruction technique. COMPARISON:  01/25/2021 head CT FINDINGS: CT HEAD FINDINGS Brain: No evidence of acute infarction, hemorrhage, hydrocephalus, extra-axial collection or mass lesion/mass effect. Vascular: No hyperdense vessel or unexpected calcification. Skull: Normal. Negative for fracture or focal lesion. CT MAXILLOFACIAL FINDINGS Osseous: Negative for fracture or mandibular dislocation Orbits: Negative Sinuses: Negative for hemosinus Soft tissues: 3 mm foreign body in the left lower lip CT CERVICAL SPINE FINDINGS Alignment: Normal Skull base and vertebrae: No acute fracture. No primary bone lesion or focal pathologic process. Soft tissues and spinal canal: No prevertebral fluid or swelling. No visible canal hematoma. Disc levels: No significant degenerative change or visible impingement Upper chest: Negative IMPRESSION: 1. 3 mm foreign body in the left lower lip. Negative for facial fracture. 2. No evidence of intracranial or cervical spine injury. Electronically Signed   By: Jorje Guild M.D.   On: 06/17/2022 06:32   CT Maxillofacial Wo Contrast  Result Date: 06/17/2022 CLINICAL DATA:  Intoxication with fall and facial injury EXAM: CT HEAD WITHOUT CONTRAST CT MAXILLOFACIAL WITHOUT CONTRAST CT CERVICAL SPINE WITHOUT CONTRAST TECHNIQUE: Multidetector CT imaging of the head, cervical spine, and maxillofacial structures were performed using the standard protocol without intravenous contrast. Multiplanar CT image reconstructions of the cervical spine and maxillofacial structures were also generated. RADIATION DOSE REDUCTION: This exam was performed according to the departmental dose-optimization program which includes automated exposure control, adjustment of the mA and/or kV according to patient size and/or use of iterative reconstruction technique. COMPARISON:  01/25/2021 head CT FINDINGS: CT  HEAD FINDINGS Brain: No evidence of acute infarction, hemorrhage, hydrocephalus, extra-axial collection or mass lesion/mass effect. Vascular: No hyperdense vessel or unexpected calcification. Skull: Normal. Negative for fracture or focal lesion. CT MAXILLOFACIAL FINDINGS Osseous: Negative for fracture or mandibular dislocation Orbits: Negative Sinuses: Negative for hemosinus Soft tissues: 3 mm foreign body in the left lower lip CT CERVICAL SPINE FINDINGS Alignment: Normal Skull base and vertebrae: No acute fracture. No primary bone lesion or focal pathologic process. Soft tissues and spinal canal: No prevertebral fluid or swelling. No visible canal hematoma. Disc levels: No significant degenerative change or visible impingement Upper chest: Negative IMPRESSION: 1. 3 mm foreign body in the left lower lip. Negative for facial fracture. 2. No evidence of intracranial or cervical spine injury. Electronically Signed   By: Jorje Guild M.D.   On: 06/17/2022 06:32   CT Cervical Spine Wo Contrast  Result Date: 06/17/2022 CLINICAL DATA:  Intoxication with fall and facial injury EXAM: CT HEAD WITHOUT CONTRAST CT MAXILLOFACIAL WITHOUT CONTRAST CT CERVICAL SPINE WITHOUT CONTRAST TECHNIQUE: Multidetector CT imaging of the head, cervical spine, and maxillofacial structures were performed using the standard protocol without intravenous contrast. Multiplanar  CT image reconstructions of the cervical spine and maxillofacial structures were also generated. RADIATION DOSE REDUCTION: This exam was performed according to the departmental dose-optimization program which includes automated exposure control, adjustment of the mA and/or kV according to patient size and/or use of iterative reconstruction technique. COMPARISON:  01/25/2021 head CT FINDINGS: CT HEAD FINDINGS Brain: No evidence of acute infarction, hemorrhage, hydrocephalus, extra-axial collection or mass lesion/mass effect. Vascular: No hyperdense vessel or unexpected  calcification. Skull: Normal. Negative for fracture or focal lesion. CT MAXILLOFACIAL FINDINGS Osseous: Negative for fracture or mandibular dislocation Orbits: Negative Sinuses: Negative for hemosinus Soft tissues: 3 mm foreign body in the left lower lip CT CERVICAL SPINE FINDINGS Alignment: Normal Skull base and vertebrae: No acute fracture. No primary bone lesion or focal pathologic process. Soft tissues and spinal canal: No prevertebral fluid or swelling. No visible canal hematoma. Disc levels: No significant degenerative change or visible impingement Upper chest: Negative IMPRESSION: 1. 3 mm foreign body in the left lower lip. Negative for facial fracture. 2. No evidence of intracranial or cervical spine injury. Electronically Signed   By: Jorje Guild M.D.   On: 06/17/2022 06:32    Procedures Procedure Name: Intubation Date/Time: 06/18/2022 11:40 AM  Performed by: Luster Landsberg, MDPre-anesthesia Checklist: Patient identified, Timeout performed, Suction available and Patient being monitored Oxygen Delivery Method: Ambu bag Preoxygenation: Pre-oxygenation with 100% oxygen Induction Type: Rapid sequence Ventilation: Mask ventilation without difficulty Laryngoscope Size: Mac, Glidescope and 3 Grade View: Grade II Tube size: 7.5 mm Airway Equipment and Method: Rigid stylet Placement Confirmation: ETT inserted through vocal cords under direct vision, Positive ETCO2, CO2 detector and Breath sounds checked- equal and bilateral Secured at: 25 cm Tube secured with: ETT holder Dental Injury: Teeth and Oropharynx as per pre-operative assessment         Medications Ordered in ED Medications  norepinephrine (LEVOPHED) 69m in 2548m(0.016 mg/mL) premix infusion (21 mcg/min Intravenous Rate/Dose Change 06/18/22 1136)  ketamine HCl 50 MG/5ML SOSY (has no administration in time range)  rocuronium (ZEMURON) injection (100 mg Intravenous Given 06/18/22 1120)  ketamine (KETALAR) injection (100 mg  Intravenous Given 06/18/22 1119)  midazolam (VERSED) injection 1-2 mg (has no administration in time range)  ceFEPIme (MAXIPIME) 2 g in sodium chloride 0.9 % 100 mL IVPB (has no administration in time range)  vancomycin (VANCOREADY) IVPB 1750 mg/350 mL (has no administration in time range)  lactated ringers bolus 3,000 mL (3,000 mLs Intravenous New Bag/Given 06/18/22 1137)  naloxone (NARCAN) injection 1 mg (1 mg Intravenous Given 06/18/22 1100)  naloxone (NARCAN) injection 1 mg (1 mg Intravenous Given 06/18/22 1102)  sodium chloride 0.9 % bolus 1,000 mL (1,000 mLs Intravenous New Bag/Given 06/18/22 1116)    ED Course/ Medical Decision Making/ A&P                           Medical Decision Making Amount and/or Complexity of Data Reviewed Labs: ordered. Decision-making details documented in ED Course. Radiology: ordered and independent interpretation performed. Decision-making details documented in ED Course. ECG/medicine tests: ordered and independent interpretation performed. Decision-making details documented in ED Course.  Risk Prescription drug management. Decision regarding hospitalization.   Patient presents as above.  On chart review, patient was seen by MSE yesterday and noted to be intoxicated with abrasions to lower lip and nose.  CT head and face as well as CT C-spine yesterday showed foreign body in lower lip, no acute intracranial hemorrhage, no C-spine injury.  On initial arrival, patient was unresponsive, cool to the touch, intermittently taking breaths, low respiratory rate (4-6), respirations being assisted with BVM.  Patient given additional Narcan here, no improvement.  Foley placed.  Patient significantly hypothermic at 79.5 F.  Patient was hypotensive.  Warmed IV fluids initiated.  Patient given approximately 3 L resuscitation.  Levophed initiated.  Bair hugger placed on patient.  Patient was intubated as per procedure note above.  Differential at this time  remains broad, however I suspect his hypotension and altered mental status are in part due to his significant hypothermia.  Patient has reported significant alcohol use, on chart review has had significant alcohol use.  CT head yesterday showed no intracranial hemorrhage, however patient was found down here, could have new trauma.  CT head ordered.  Due to hypothermia, hypotension, altered mental status, broad-spectrum antibiotics, blood cultures ordered.  Initial i-STAT VBG showed pH 7.080, pCO2 47.3 NA 146, K4.2, hemoglobin 16.7, CBG 81  EKG shows diffuse Osborne waves.  Critical care team consulted to evaluate for ECMO in the setting of severe hypothermia.  The patient was discussed with the critical care team.  They agreed to admit the patient to their service.  We will attempt other attempts for warming prior to ECMO does remain in consideration at this time.  Patient admitted to ICU for management of his critical illness.        Final Clinical Impression(s) / ED Diagnoses Final diagnoses:  None    Rx / DC Orders ED Discharge Orders     None         Luster Landsberg, MD 06/18/22 1547    Sherwood Gambler, MD 06/19/22 [phone]

## 2022-06-18 NOTE — ED Provider Notes (Signed)
I saw and evaluated the patient, reviewed the resident's note and I agree with the findings and plan. I directly supervised the intubation by Dr. Louellen Molder  EKG Interpretation  Date/Time:  Thursday June 18 2022 11:35:35 EST Ventricular Rate:  68 PR Interval:  173 QRS Duration: 137 QT Interval:  547 QTC Calculation: 582 R Axis:   51 Text Interpretation: Sinus rhythm Nonspecific intraventricular conduction delay J waves? EKG looks like hypothermia Confirmed by Pricilla Loveless [phone] on 06/18/2022 11:51:51 AM   Patient presents unresponsive, GCS 3.  Was found in the grass.  Chart review shows he was just here yesterday, left without being seen.  He was given Narcan with minimal improvement, the only noticeable difference being a slight increase in his respiratory rate.  However he was intubated for airway protection.  He was resuscitated with warm fluids and Levophed.  I suspect his hypotension is at least mostly related to the severe hypothermia with a temperature below 80 F.  He was given broad-spectrum antibiotics but at this point it is probably more temperature related.  He was warmed with the bair hugger, warm fluids, and increasing the temperature in the room.  Discussed with Dr. Katrinka Blazing for potential ECMO and ICU admission.  He is critically ill and will be admitted to the ICU.  CRITICAL CARE Performed by: Audree Camel   Total critical care time: 50 minutes  Critical care time was exclusive of separately billable procedures and treating other patients.  Critical care was necessary to treat or prevent imminent or life-threatening deterioration.  Critical care was time spent personally by me on the following activities: development of treatment plan with patient and/or surrogate as well as nursing, discussions with consultants, evaluation of patient's response to treatment, examination of patient, obtaining history from patient or surrogate, ordering and performing treatments and  interventions, ordering and review of laboratory studies, ordering and review of radiographic studies, pulse oximetry and re-evaluation of patient's condition.    Pricilla Loveless, MD 06/18/22 [phone]

## 2022-06-18 NOTE — ED Provider Notes (Signed)
Angiocath insertion Performed by: Audree Camel  Consent: Verbal consent obtained. Risks and benefits: risks, benefits and alternatives were discussed Time out: Immediately prior to procedure a "time out" was called to verify the correct patient, procedure, equipment, support staff and site/side marked as required.  Preparation: Patient was prepped and draped in the usual sterile fashion.  Vein Location: right basilic  Ultrasound Guided  Gauge: 18  Normal blood return and flush without difficulty Patient tolerance: Patient tolerated the procedure well with no immediate complications.     Pricilla Loveless, MD 06/18/22 [phone]

## 2022-06-18 NOTE — Progress Notes (Signed)
ABG obtained on ventilator settings of VT: 550/28/+5/50%. Results provided to [employer] and filed. Increased O2 per 69% result from 50% to 60%. Repeat ABG scheduled for 2000. No additional changes made at this time. Will continue to monitor

## 2022-06-19 ENCOUNTER — Inpatient Hospital Stay (HOSPITAL_COMMUNITY): Payer: Medicaid Other

## 2022-06-19 DIAGNOSIS — I428 Other cardiomyopathies: Secondary | ICD-10-CM

## 2022-06-19 DIAGNOSIS — J690 Pneumonitis due to inhalation of food and vomit: Secondary | ICD-10-CM

## 2022-06-19 DIAGNOSIS — J9601 Acute respiratory failure with hypoxia: Secondary | ICD-10-CM

## 2022-06-19 LAB — COMPREHENSIVE METABOLIC PANEL
ALT: 34 U/L (ref 0–44)
ALT: 37 U/L (ref 0–44)
AST: 54 U/L — ABNORMAL HIGH (ref 15–41)
AST: 67 U/L — ABNORMAL HIGH (ref 15–41)
Albumin: 2.9 g/dL — ABNORMAL LOW (ref 3.5–5.0)
Albumin: 3.2 g/dL — ABNORMAL LOW (ref 3.5–5.0)
Alkaline Phosphatase: 46 U/L (ref 38–126)
Alkaline Phosphatase: 50 U/L (ref 38–126)
Anion gap: 18 — ABNORMAL HIGH (ref 5–15)
Anion gap: 13 (ref 5–15)
BUN: 6 mg/dL (ref 6–20)
BUN: <5 mg/dL — ABNORMAL LOW (ref 6–20)
CO2: 19 mmol/L — ABNORMAL LOW (ref 22–32)
CO2: 26 mmol/L (ref 22–32)
Calcium: 7.5 mg/dL — ABNORMAL LOW (ref 8.9–10.3)
Calcium: 7.6 mg/dL — ABNORMAL LOW (ref 8.9–10.3)
Chloride: 106 mmol/L (ref 98–111)
Chloride: 106 mmol/L (ref 98–111)
Creatinine, Ser: 1.02 mg/dL (ref 0.61–1.24)
Creatinine, Ser: 1.09 mg/dL (ref 0.61–1.24)
GFR, Estimated: >60 mL/min (ref >60)
GFR, Estimated: >60 mL/min (ref >60)
Glucose, Bld: 225 mg/dL — ABNORMAL HIGH (ref 70–99)
Glucose, Bld: 115 mg/dL — ABNORMAL HIGH (ref 70–99)
Potassium: 2.9 mmol/L — ABNORMAL LOW (ref 3.5–5.1)
Potassium: 3.3 mmol/L — ABNORMAL LOW (ref 3.5–5.1)
Sodium: 143 mmol/L (ref 135–145)
Sodium: 145 mmol/L (ref 135–145)
Total Bilirubin: 0.5 mg/dL (ref 0.3–1.2)
Total Bilirubin: 0.3 mg/dL (ref 0.3–1.2)
Total Protein: 5.5 g/dL — ABNORMAL LOW (ref 6.5–8.1)
Total Protein: 5.9 g/dL — ABNORMAL LOW (ref 6.5–8.1)

## 2022-06-19 LAB — ECHOCARDIOGRAM COMPLETE
Height: 74 in
S' Lateral: 2.4 cm
Weight: 3703.73 oz

## 2022-06-19 LAB — BLOOD CULTURE ID PANEL (REFLEXED) - BCID2

## 2022-06-19 LAB — RESP PANEL BY RT-PCR (RSV, FLU A&B, COVID)  RVPGX2
Influenza A by PCR: NEGATIVE
Influenza B by PCR: NEGATIVE
Resp Syncytial Virus by PCR: NEGATIVE
SARS Coronavirus 2 by RT PCR: NEGATIVE

## 2022-06-19 LAB — LACTIC ACID, PLASMA
Lactic Acid, Venous: 4.7 mmol/L (ref 0.5–1.9)
Lactic Acid, Venous: 4.1 mmol/L (ref 0.5–1.9)
Lactic Acid, Venous: 2.8 mmol/L (ref 0.5–1.9)

## 2022-06-19 LAB — GLUCOSE, CAPILLARY
Glucose-Capillary: 220 mg/dL — ABNORMAL HIGH (ref 70–99)
Glucose-Capillary: 189 mg/dL — ABNORMAL HIGH (ref 70–99)
Glucose-Capillary: 126 mg/dL — ABNORMAL HIGH (ref 70–99)
Glucose-Capillary: 94 mg/dL (ref 70–99)
Glucose-Capillary: 102 mg/dL — ABNORMAL HIGH (ref 70–99)
Glucose-Capillary: 139 mg/dL — ABNORMAL HIGH (ref 70–99)

## 2022-06-19 LAB — CBC
HCT: 35.4 % — ABNORMAL LOW (ref 39.0–52.0)
Hemoglobin: 12.1 g/dL — ABNORMAL LOW (ref 13.0–17.0)
MCH: 32.7 pg (ref 26.0–34.0)
MCHC: 34.2 g/dL (ref 30.0–36.0)
MCV: 95.7 fL (ref 80.0–100.0)
Platelets: 271 10*3/uL (ref 150–400)
RBC: 3.7 MIL/uL — ABNORMAL LOW (ref 4.22–5.81)
RDW: 12.2 % (ref 11.5–15.5)
WBC: 17.2 10*3/uL — ABNORMAL HIGH (ref 4.0–10.5)
nRBC: 0 % (ref 0.0–0.2)

## 2022-06-19 LAB — URINE CULTURE: Culture: NO GROWTH

## 2022-06-19 LAB — MAGNESIUM: Magnesium: 1.5 mg/dL — ABNORMAL LOW (ref 1.7–2.4)

## 2022-06-19 LAB — PHOSPHORUS: Phosphorus: 1.9 mg/dL — ABNORMAL LOW (ref 2.5–4.6)

## 2022-06-19 MED ORDER — PHENOBARBITAL 32.4 MG PO TABS
32.4000 mg | ORAL_TABLET | Freq: Three times a day (TID) | ORAL | Status: AC
  Administered 2022-06-21 – 2022-06-23 (×6): 32.4 mg via ORAL
  Filled 2022-06-19 (×6): qty 1

## 2022-06-19 MED ORDER — FOLIC ACID 1 MG PO TABS
1.0000 mg | ORAL_TABLET | Freq: Every day | ORAL | Status: DC
  Administered 2022-06-20 – 2022-06-24 (×5): 1 mg via ORAL
  Filled 2022-06-19 (×5): qty 1

## 2022-06-19 MED ORDER — FENTANYL 2500MCG IN NS 250ML (10MCG/ML) PREMIX INFUSION
0.0000 ug/h | INTRAVENOUS | Status: DC
  Administered 2022-06-19: 50 ug/h via INTRAVENOUS
  Filled 2022-06-19: qty 250

## 2022-06-19 MED ORDER — LORAZEPAM 2 MG/ML IJ SOLN: 1.0000 mg | INTRAMUSCULAR | Status: DC | PRN

## 2022-06-19 MED ORDER — POTASSIUM CHLORIDE CRYS ER 20 MEQ PO TBCR
40.0000 meq | EXTENDED_RELEASE_TABLET | Freq: Once | ORAL | Status: AC
  Administered 2022-06-19: 40 meq via ORAL
  Filled 2022-06-19: qty 2

## 2022-06-19 MED ORDER — SODIUM CHLORIDE 0.9 % IV SOLN
3.0000 g | Freq: Four times a day (QID) | INTRAVENOUS | Status: DC
  Administered 2022-06-19 – 2022-06-21 (×9): 3 g via INTRAVENOUS
  Filled 2022-06-19 (×9): qty 8

## 2022-06-19 MED ORDER — THIAMINE MONONITRATE 100 MG PO TABS
100.0000 mg | ORAL_TABLET | Freq: Every day | ORAL | Status: DC
  Administered 2022-06-20 – 2022-06-24 (×5): 100 mg via ORAL
  Filled 2022-06-19 (×5): qty 1

## 2022-06-19 MED ORDER — PHENOBARBITAL 32.4 MG PO TABS
64.8000 mg | ORAL_TABLET | Freq: Three times a day (TID) | ORAL | Status: AC
  Administered 2022-06-19 – 2022-06-21 (×5): 64.8 mg via ORAL
  Filled 2022-06-19 (×6): qty 2

## 2022-06-19 MED ORDER — ACETAMINOPHEN 325 MG PO TABS
650.0000 mg | ORAL_TABLET | ORAL | Status: DC | PRN
  Administered 2022-06-19 – 2022-06-20 (×3): 650 mg via ORAL
  Filled 2022-06-19 (×3): qty 2

## 2022-06-19 MED ORDER — ADULT MULTIVITAMIN W/MINERALS CH
1.0000 | ORAL_TABLET | Freq: Every day | ORAL | Status: DC
  Administered 2022-06-20 – 2022-06-24 (×5): 1 via ORAL
  Filled 2022-06-19 (×5): qty 1

## 2022-06-19 MED ORDER — PHENOL 1.4 % MT LIQD: 1.0000 | OROMUCOSAL | Status: DC | PRN

## 2022-06-19 MED ORDER — POTASSIUM CHLORIDE 20 MEQ PO PACK
40.0000 meq | PACK | Freq: Once | ORAL | Status: AC
  Administered 2022-06-19: 40 meq
  Filled 2022-06-19: qty 2

## 2022-06-19 MED ORDER — FAMOTIDINE 20 MG PO TABS
20.0000 mg | ORAL_TABLET | Freq: Two times a day (BID) | ORAL | Status: DC
  Administered 2022-06-19 – 2022-06-20 (×2): 20 mg via ORAL
  Filled 2022-06-19 (×2): qty 1

## 2022-06-19 MED ORDER — THIAMINE HCL 100 MG/ML IJ SOLN: 100.0000 mg | Freq: Every day | INTRAMUSCULAR | Status: DC

## 2022-06-19 MED ORDER — LEVETIRACETAM 500 MG PO TABS
500.0000 mg | ORAL_TABLET | Freq: Two times a day (BID) | ORAL | Status: DC
  Administered 2022-06-19 – 2022-06-21 (×4): 500 mg via ORAL
  Filled 2022-06-19 (×4): qty 1

## 2022-06-19 MED ORDER — LACTATED RINGERS IV SOLN: INTRAVENOUS | Status: DC

## 2022-06-19 MED ORDER — POTASSIUM CHLORIDE 20 MEQ PO PACK
40.0000 meq | PACK | ORAL | Status: DC
  Administered 2022-06-19: 40 meq via ORAL
  Filled 2022-06-19: qty 2

## 2022-06-19 MED ORDER — PHENOBARBITAL SODIUM 65 MG/ML IJ SOLN
65.0000 mg | Freq: Once | INTRAMUSCULAR | Status: AC
  Administered 2022-06-19: 65 mg via INTRAVENOUS
  Filled 2022-06-19: qty 1

## 2022-06-19 MED ORDER — PHENOBARBITAL 32.4 MG PO TABS
64.8000 mg | ORAL_TABLET | Freq: Once | ORAL | Status: AC
  Administered 2022-06-19: 64.8 mg via ORAL

## 2022-06-19 MED ORDER — INSULIN ASPART 100 UNIT/ML IJ SOLN
0.0000 [IU] | INTRAMUSCULAR | Status: DC
  Administered 2022-06-19 (×2): 2 [IU] via SUBCUTANEOUS
  Administered 2022-06-19: 3 [IU] via SUBCUTANEOUS

## 2022-06-19 MED ORDER — POTASSIUM PHOSPHATES 15 MMOLE/5ML IV SOLN
30.0000 mmol | Freq: Once | INTRAVENOUS | Status: AC
  Administered 2022-06-19: 30 mmol via INTRAVENOUS
  Filled 2022-06-19: qty 10

## 2022-06-19 MED ORDER — DEXTROSE IN LACTATED RINGERS 5 % IV SOLN: INTRAVENOUS | Status: DC

## 2022-06-19 MED ORDER — CHLORHEXIDINE GLUCONATE CLOTH 2 % EX PADS
6.0000 | MEDICATED_PAD | Freq: Every day | CUTANEOUS | Status: DC
  Administered 2022-06-20 (×2): 6 via TOPICAL

## 2022-06-19 MED ORDER — POTASSIUM CHLORIDE 20 MEQ PO PACK
40.0000 meq | PACK | Freq: Once | ORAL | Status: DC
  Filled 2022-06-19: qty 2

## 2022-06-19 MED ORDER — ORAL CARE MOUTH RINSE: 15.0000 mL | OROMUCOSAL | Status: DC | PRN

## 2022-06-19 MED ORDER — MAGNESIUM SULFATE 4 GM/100ML IV SOLN
4.0000 g | Freq: Once | INTRAVENOUS | Status: AC
  Administered 2022-06-19: 4 g via INTRAVENOUS
  Filled 2022-06-19: qty 100

## 2022-06-19 NOTE — Progress Notes (Signed)
A little diaphoretic and tachycardic I am worried about wd Plan Will give extra dose of phenobarb  Simonne Martinet ACNP-BC [facility] Pulmonary/Critical Care Pager # [phone] OR # [phone] if no answer

## 2022-06-19 NOTE — Progress Notes (Signed)
eLink Physician-Brief Progress Note Patient Name: Brett Molina DOB: May 18, 1991 MRN: [id number]   Date of Service  06/19/2022  HPI/Events of Note  Patient requiring frequent bolus doses of sedative on the Ventilator.  eICU Interventions  Fentanyl gtt ordered.        Brett Molina 06/19/2022, 12:17 AM

## 2022-06-19 NOTE — Evaluation (Signed)
Physical Therapy Evaluation Patient Details Name: Brett Molina MRN: [id number] DOB: 08-24-1990 Today's Date: 06/19/2022  History of Present Illness  31 yo male admitted 12/14 after found down and unresponsive with hypothermia, metabolic acidosis and hypotension. Intubated 12/14-12/15. PMHx: EtOH use disorder, withdrawal seizures  Clinical Impression  Pt pleasant and willing to mobilize with pt reporting lack of orientation and homelessness. Pt able to walk with limited deficit with reliance on supplemental O2. Pt with decreased activity tolerance, balance and cardiopulmonary function who will benefit from acute therapy to maximize mobility, safety and independence. Pt states he was trying to find a shelter PTA and would like assistance with this.   HR 120-144 with activity SPO2 97% on 2L at rest, drop to 87% on RA with limited gait, 94% on 2L with mobility       Recommendations for follow up therapy are one component of a multi-disciplinary discharge planning process, led by the attending physician.  Recommendations may be updated based on patient status, additional functional criteria and insurance authorization.  Follow Up Recommendations No PT follow up      Assistance Recommended at Discharge PRN  Patient can return home with the following       Equipment Recommendations None recommended by PT  Recommendations for Other Services       Functional Status Assessment Patient has had a recent decline in their functional status and demonstrates the ability to make significant improvements in function in a reasonable and predictable amount of time.     Precautions / Restrictions Precautions Precautions: Fall;Other (comment) Precaution Comments: watch sats and HR      Mobility  Bed Mobility Overal bed mobility: Needs Assistance Bed Mobility: Supine to Sit     Supine to sit: Supervision     General bed mobility comments: supervision for lines and safety     Transfers Overall transfer level: Needs assistance   Transfers: Sit to/from Stand Sit to Stand: Min guard           General transfer comment: guarding for lines and safety, mild instability in standing    Ambulation/Gait Ambulation/Gait assistance: Min guard Gait Distance (Feet): 150 Feet Assistive device: None Gait Pattern/deviations: Step-through pattern, Decreased stride length   Gait velocity interpretation: 1.31 - 2.62 ft/sec, indicative of limited community ambulator   General Gait Details: mild instability with gait with drop in sats to 87% on RA with limited 10' of gait. Returned to 2L with SPO2 maintained 94% with activity. 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 at which time he became homeless    Prior Function Prior Level of Function : Independent/Modified Independent  Hand Dominance        Extremity/Trunk Assessment   Upper Extremity Assessment Upper Extremity Assessment: Overall WFL for tasks assessed    Lower Extremity Assessment Lower Extremity Assessment: Overall WFL for tasks assessed    Cervical / Trunk Assessment Cervical / Trunk Assessment: Normal  Communication   Communication: No difficulties  Cognition Arousal/Alertness: Awake/alert Behavior During Therapy: WFL for tasks assessed/performed Overall Cognitive Status: Impaired/Different from baseline Area of Impairment: Orientation                  Orientation Level: Disoriented to, Time, Situation                      General Comments      Exercises     Assessment/Plan    PT Assessment Patient needs continued PT services  PT Problem List Decreased strength;Decreased mobility;Decreased activity tolerance;Decreased balance;Cardiopulmonary status limiting activity       PT Treatment Interventions Gait training;Therapeutic activities;Therapeutic exercise;Patient/family education;Functional mobility training    PT Goals (Current goals can be found in the Care Plan section)  Acute Rehab PT Goals Patient Stated Goal: find a place to stay PT Goal Formulation: With patient Time For Goal Achievement: 06/26/22 Potential to Achieve Goals: Good    Frequency Min 3X/week     Co-evaluation               AM-PAC PT "6 Clicks" Mobility  Outcome Measure Help needed turning from your back to your side while in a flat bed without using bedrails?: None Help needed moving from lying on your back to sitting on the side of a flat bed without using bedrails?: None Help needed moving to and from a bed to a chair (including a wheelchair)?: A Little Help needed standing up from a chair using your arms (e.g., wheelchair or bedside chair)?: A Little Help needed to walk in hospital room?: A Little Help needed climbing 3-5 steps with a railing? : A Lot 6 Click Score: 19    End of Session Equipment Utilized During Treatment: Gait belt;Oxygen Activity Tolerance: Patient tolerated treatment well Patient left: in chair;with call bell/phone within reach;with chair alarm set;with nursing/sitter in room Nurse Communication: Mobility status PT Visit Diagnosis: Other abnormalities of gait and mobility (R26.89)    Time: 5320-2334 PT Time Calculation (min) (ACUTE ONLY): 24 min   Charges:   PT Evaluation $PT Eval Moderate Complexity: 1 Mod PT Treatments $Therapeutic Activity: 8-22 mins        Merryl Hacker, PT Acute Rehabilitation  Services Office: [phone]   Enedina Finner Jansen Sciuto 06/19/2022, 12:04 PM

## 2022-06-19 NOTE — Progress Notes (Addendum)
NAME:  Brett Molina, MRN:  [id number], DOB:  April 26, 1991, LOS: 1 ADMISSION DATE:  06/18/2022, CONSULTATION DATE:  06/18/22 REFERRING MD:  Brett Molina, CHIEF COMPLAINT:  found down   History of Present Illness:  31 year old man w/ hx of alcohol dependence and w/d seizures presenting after being found down.  Was at ER yesterday for w/d symptoms but did not want to stay, worried about losing job.  No loss of pulse.  Intubated for airway protection. Acidemic, hypotensive, and hypothermic in ER.  Probable aspiration on CXR. Labs trickling in, head scan pending, PCCM asked to admit.  Pertinent  Medical History  HTN EtOH dependence DTs  Significant Hospital Events: Including procedures, antibiotic start and stop dates in addition to other pertinent events   06/18/22 admit  12/15 Increased sedation for Vent Synch - Following commands, weaning pressors. EEG was negative   Interim History / Subjective:  Per note, fent gtt added for vent synch. Fio2 decreased on vent.   Objective   Blood pressure 109/70, pulse 100, temperature 99.1 F (37.3 C), resp. rate (!) 30, height 6\' 2"  (1.88 m), weight 105 kg, SpO2 99 %.    Vent Mode: PRVC FiO2 (%):  [40 %-100 %] 40 % Set Rate:  [16 bmp-30 bmp] 30 bmp Vt Set:  [500 mL-550 mL] 550 mL PEEP:  [5 cmH20-8 cmH20] 8 cmH20 Plateau Pressure:  [14 cmH20-18 cmH20] 18 cmH20   Intake/Output Summary (Last 24 hours) at 06/19/2022 0700 Last data filed at 06/19/2022 06/21/2022 Gross per 24 hour  Intake 3168.83 ml  Output 4815 ml  Net -1646.17 ml   Filed Weights   06/19/22 0405  Weight: 105 kg    EPhysical Examination: General: Acutely ill-appearing male in NAD. Sedated and Intubated HEENT: [place]/AT, anicteric sclera, PERRL, moist mucous membranes. Neuro: Sedated. Responds to verbal stimuli. Following commands consistently. Moves all 4 extremities spontaneously.  CV: RRR, no m/g/r. PULM: Breathing even and unlabored on MV. Lung fields Clear/Diminished. GI:  Soft, nontender, nondistended. Normoactive bowel sounds. Extremities: No obvious deformity noted. Skin: Warm/dry, intact.  Resolved Hospital Problem list   N/A  Assessment & Plan:  Coma, hypothermia , found down, aspiration pneumonitis -requiring MV A seizure followed by prolonged downtime and aspiration seems likely.  Plan  - sedation with goal RASS -1  - Wean for SBT today, hopefully extubate  - VAP prevention protocol - PAD Protocol   Acute Metabolic Acidosis  Plan - LA trending appropriately  - Continue Bicarb for metabolic acidosis (d/c)  - Continue Unasyn  - F/u cultures, NGTD on tracheal aspirate, BC, UC - Maintain normothermia   Seizure Plan  - CT head - NAICA - EEG neg   ETOH Abuse  - CIWA driven ativan - Thiamine/folate  Hypotension - Continue to wean NE as tolerated  - MAP Goal >65 - Trops Neg - Echo pending   Hypokalemia/Hypomagnesia - Replace as needed   Best Practice (right click and "Reselect all SmartList Selections" daily)   Diet/type: NPO DVT prophylaxis: SCD GI prophylaxis: H2B Lines: Central line Foley:  Yes, and it is still needed Code Status:  full code Last date of multidisciplinary goals of care discussion [I called the numbers listed for brother and spouse, no answer]  Critical care time: 9    Brett Molina , NP-S  I have seen and evaluated MR Brett Molina w/ Mr 29.   Briefly 31 year old male admitted after being found outside unresponsive. Working dx is withdrawal seizure. He was comatose  on arrival 2/2 hypothermia, acidosis, and prob post-ictal state. Now awake and passing SBT  Exam  31 year old male resting in bed NAD HENT NCAT orally intubated Pulm scattered rhonchi no accessory use excellent effort  Card rrr no MRG Abd soft not tender Ext warm and dry  Neuro awake. Following commands.   Acute metabolic encephalopathy w/ comatose state->resolving Hypothermia-->resolved Probable post-ictal state H/o ETOH seizures w/  wd Aspiration PNA  Acute hypoxic respiratory failure  Shock (circulatory, undifferentiated) ->now resolved.  H/o ETOH abuse  Fluid and electrolyte imbalance: hyponatremia, hypokalemia and hypophosphatemia  Lactic acidosis->improved  Discussion Looks better. Now awake. Acid base improved. Passing SBT.   Plan Extubate Day 2 of 5 unasyn Cont CIWA protocol Phenobarb protocol per pharmacy  Cont keppra (h/o wd seizure) ->EEG was neg F/u chems later today    My time 32 min  Brett Molina ACNP-BC [place] Pager # [phone] OR # [phone] if no answer

## 2022-06-19 NOTE — Progress Notes (Signed)
  Echocardiogram 2D Echocardiogram has been performed.  Leta Jungling M 06/19/2022, 8:14 AM

## 2022-06-19 NOTE — Procedures (Signed)
Extubation Procedure Note  Patient Details:   Name: Brett Molina DOB: 08-17-1990 MRN: [id number]   Airway Documentation:    Vent end date: 06/19/22 Vent end time: 0926   Evaluation  O2 sats: stable throughout Complications: No apparent complications Patient did tolerate procedure well. Bilateral Breath Sounds: Clear, Diminished   Yes  Pt extubated per MD order. Placed on 2L [place]. Positive cuff leak noted, no stridor heard. RT will continue to monitor.   [name] 06/19/2022, 9:30 AM

## 2022-06-19 NOTE — Progress Notes (Signed)
[facility] ADULT ICU REPLACEMENT PROTOCOL   The patient does apply for the [facility] Adult ICU Electrolyte Replacment Protocol based on the criteria listed below:   1.Exclusion criteria: TCTS, ECMO, Dialysis, and Myasthenia Gravis patients 2. Is GFR >/= 30 ml/min? Yes.    Patient's GFR today is >60 3. Is SCr </= 2? Yes.   Patient's SCr is 1.02 mg/dL 4. Did SCr increase >/= 0.5 in 24 hours? No. 5.Pt's weight >40kg  Yes.   6. Abnormal electrolyte(s):   K 2.9, Phos 1.9, Mg 1.5  7. Electrolytes replaced per protocol 8.  Call MD STAT for K+ </= 2.5, Phos </= 1, or Mag </= 1 Physician:  Shawn Stall R [place]in 06/19/2022 5:32 AM

## 2022-06-20 DIAGNOSIS — J9601 Acute respiratory failure with hypoxia: Secondary | ICD-10-CM | POA: Diagnosis present

## 2022-06-20 DIAGNOSIS — J9602 Acute respiratory failure with hypercapnia: Secondary | ICD-10-CM | POA: Diagnosis present

## 2022-06-20 LAB — CBC
HCT: 31.3 % — ABNORMAL LOW (ref 39.0–52.0)
Hemoglobin: 10.8 g/dL — ABNORMAL LOW (ref 13.0–17.0)
MCH: 33.6 pg (ref 26.0–34.0)
MCHC: 34.5 g/dL (ref 30.0–36.0)
MCV: 97.5 fL (ref 80.0–100.0)
Platelets: 162 10*3/uL (ref 150–400)
RBC: 3.21 MIL/uL — ABNORMAL LOW (ref 4.22–5.81)
RDW: 12.6 % (ref 11.5–15.5)
WBC: 11.8 10*3/uL — ABNORMAL HIGH (ref 4.0–10.5)
nRBC: 0 % (ref 0.0–0.2)

## 2022-06-20 LAB — GLUCOSE, CAPILLARY
Glucose-Capillary: 90 mg/dL (ref 70–99)
Glucose-Capillary: 97 mg/dL (ref 70–99)
Glucose-Capillary: 116 mg/dL — ABNORMAL HIGH (ref 70–99)
Glucose-Capillary: 100 mg/dL — ABNORMAL HIGH (ref 70–99)
Glucose-Capillary: 112 mg/dL — ABNORMAL HIGH (ref 70–99)
Glucose-Capillary: 105 mg/dL — ABNORMAL HIGH (ref 70–99)

## 2022-06-20 LAB — HEMOGLOBIN A1C
Hgb A1c MFr Bld: 5.3 % (ref 4.8–5.6)
Mean Plasma Glucose: 105 mg/dL

## 2022-06-20 LAB — COMPREHENSIVE METABOLIC PANEL
ALT: 34 U/L (ref 0–44)
AST: 77 U/L — ABNORMAL HIGH (ref 15–41)
Albumin: 3 g/dL — ABNORMAL LOW (ref 3.5–5.0)
Alkaline Phosphatase: 45 U/L (ref 38–126)
Anion gap: 7 (ref 5–15)
BUN: <5 mg/dL — ABNORMAL LOW (ref 6–20)
CO2: 28 mmol/L (ref 22–32)
Calcium: 8.3 mg/dL — ABNORMAL LOW (ref 8.9–10.3)
Chloride: 107 mmol/L (ref 98–111)
Creatinine, Ser: 0.69 mg/dL (ref 0.61–1.24)
GFR, Estimated: >60 mL/min (ref >60)
Glucose, Bld: 87 mg/dL (ref 70–99)
Potassium: 3.7 mmol/L (ref 3.5–5.1)
Sodium: 142 mmol/L (ref 135–145)
Total Bilirubin: 1 mg/dL (ref 0.3–1.2)
Total Protein: 5.8 g/dL — ABNORMAL LOW (ref 6.5–8.1)

## 2022-06-20 LAB — MAGNESIUM: Magnesium: 1.9 mg/dL (ref 1.7–2.4)

## 2022-06-20 LAB — PHOSPHORUS
Phosphorus: 1.6 mg/dL — ABNORMAL LOW (ref 2.5–4.6)
Phosphorus: 2.4 mg/dL — ABNORMAL LOW (ref 2.5–4.6)

## 2022-06-20 MED ORDER — ORAL CARE MOUTH RINSE: 15.0000 mL | OROMUCOSAL | Status: DC | PRN

## 2022-06-20 MED ORDER — IBUPROFEN 200 MG PO TABS
400.0000 mg | ORAL_TABLET | Freq: Once | ORAL | Status: AC | PRN
  Administered 2022-06-20: 400 mg via ORAL
  Filled 2022-06-20: qty 2

## 2022-06-20 MED ORDER — MAGNESIUM SULFATE 2 GM/50ML IV SOLN
2.0000 g | Freq: Once | INTRAVENOUS | Status: AC
  Administered 2022-06-20: 2 g via INTRAVENOUS
  Filled 2022-06-20: qty 50

## 2022-06-20 MED ORDER — POTASSIUM PHOSPHATES 15 MMOLE/5ML IV SOLN
45.0000 mmol | Freq: Once | INTRAVENOUS | Status: AC
  Administered 2022-06-20: 45 mmol via INTRAVENOUS
  Filled 2022-06-20: qty 15

## 2022-06-20 MED ORDER — ENOXAPARIN SODIUM 40 MG/0.4ML IJ SOSY
40.0000 mg | PREFILLED_SYRINGE | INTRAMUSCULAR | Status: DC
  Administered 2022-06-20 – 2022-06-23 (×3): 40 mg via SUBCUTANEOUS
  Filled 2022-06-20 (×3): qty 0.4

## 2022-06-20 MED ORDER — COLCHICINE 0.6 MG PO TABS
0.6000 mg | ORAL_TABLET | Freq: Two times a day (BID) | ORAL | Status: DC
  Administered 2022-06-20 – 2022-06-21 (×3): 0.6 mg via ORAL
  Filled 2022-06-20 (×4): qty 1

## 2022-06-20 MED ORDER — LISINOPRIL 20 MG PO TABS
40.0000 mg | ORAL_TABLET | Freq: Every day | ORAL | Status: DC
  Administered 2022-06-20 – 2022-06-21 (×2): 40 mg via ORAL
  Filled 2022-06-20 (×2): qty 2

## 2022-06-20 MED ORDER — LIP MEDEX EX OINT
TOPICAL_OINTMENT | CUTANEOUS | Status: DC | PRN
  Filled 2022-06-20: qty 7

## 2022-06-20 NOTE — Progress Notes (Signed)
Notified [facility] of fever of 103.2 F oral at 2000, received order for cooling blanket. Administered PRN tylenol and packed pt with ice. 2130 Per portable equipment there are no cooling blankets available at this time. Rechecked temperature at 2150 is 101.3 F oral. Will continue to monitor temperature and apply cooling blanket when available if still needed.

## 2022-06-20 NOTE — Progress Notes (Signed)
eLink Physician-Brief Progress Note Patient Name: Akai Dollard Naugle DOB: 04-Aug-1990 MRN: [id number]   Date of Service  06/20/2022  HPI/Events of Note  Notified of ongoing fevers, asking for cooling blanket and something for gout foot pain other than Tylenol and colchicine, able to take PO's  Seen awake not in acute distress On ampicillin-sulbactam for aspiration pneumonia Blood culture 12/14 Staph hominis only from one site likely contaminant Creatinine 0.69  eICU Interventions  Cooling blanket and ibuprofen ordered for pain as well as fever     Intervention Category Intermediate Interventions: Other:;Pain - evaluation and management  Darl Pikes 06/20/2022, 8:25 PM

## 2022-06-20 NOTE — Progress Notes (Signed)
[facility] ADULT ICU REPLACEMENT PROTOCOL   The patient does apply for the [facility] Adult ICU Electrolyte Replacment Protocol based on the criteria listed below:   1.Exclusion criteria: TCTS, ECMO, Dialysis, and Myasthenia Gravis patients 2. Is GFR >/= 30 ml/min? Yes.    Patient's GFR today is >60 3. Is SCr </= 2? Yes.   Patient's SCr is 0.69 mg/dL 4. Did SCr increase >/= 0.5 in 24 hours? No. 5.Pt's weight >40kg  Yes.   6. Abnormal electrolyte(s):   Phos 1.6, Mg 1.9  7. Electrolytes replaced per protocol 8.  Call MD STAT for K+ </= 2.5, Phos </= 1, or Mag </= 1 Physician:  Brett. [place]  Brett Molina Brett Molina 06/20/2022 6:00 AM

## 2022-06-20 NOTE — Progress Notes (Signed)
NAME:  Brett Molina, MRN:  [id number], DOB:  Nov 13, 1990, LOS: 2 ADMISSION DATE:  06/18/2022, CONSULTATION DATE:  06/18/22 REFERRING MD:  Gwenlyn Fudge, CHIEF COMPLAINT:  found down   History of Present Illness:  31 year old man w/ hx of alcohol dependence and w/d seizures presenting after being found down.  Was at ER yesterday for w/d symptoms but did not want to stay, worried about losing job.  No loss of pulse.  Intubated for airway protection. Acidemic, hypotensive, and hypothermic in ER.  Probable aspiration on CXR. PCCM asked to admit.  Pertinent  Medical History  HTN EtOH dependence DTs  Significant Hospital Events: Including procedures, antibiotic start and stop dates in addition to other pertinent events   06/18/22 admit  12/15 Increased sedation for Vent Synch - Following commands, weaning pressors. EEG was negative. Extubated.    Interim History / Subjective:  Tmax 101.2 Tolerating extubation  Had episode concerning for withdrawal symptoms late evening 12/15  I/O 1.9L UOP, 2.1L+ in last 24 hours  Pt c/o bilateral foot pain   Objective   Blood pressure (!) 157/89, pulse (!) 122, temperature 99.3 F (37.4 C), temperature source Oral, resp. rate 19, height 6\' 2"  (1.88 m), weight 105.9 kg, SpO2 94 %.        Intake/Output Summary (Last 24 hours) at 06/20/2022 1158 Last data filed at 06/20/2022 1100 Gross per 24 hour  Intake 3849.81 ml  Output 2250 ml  Net 1599.81 ml   Filed Weights   06/19/22 0405 06/20/22 0450  Weight: 105 kg 105.9 kg    Physical Examination: General: young adult male lying in bed in NAD HEENT: MM pink/moist, good dentition, anicteric  Neuro: AAOx4, speech clear, MAE  CV: s1s2 RRR, no m/r/g PULM: non-labored at rest, lungs bilaterally clear  GI: soft, bsx4 active  Extremities: warm/dry, no edema, socks removed, no lesions on feet / warm to touch / pink > no evidence of frost bite  Skin: no rashes or lesions  Resolved Hospital Problem  list   N/A  Assessment & Plan:   Coma / Acute Metabolic Encephalopathy  Hypothermia / Found Down Possible withdrawal seizure followed by prolonged downtime and aspiration seems likely. No evidence of seizure inpatient.  -minimize sedating medications  -continue keppra -PRN ativan / CIWA protocol  -phenobarbital wean  -continue thiamine, folate, MVI  -maintain normothermia   Aspiration Pneumonitis  Required MV, extubated 12/15 -pulmonary hygiene -IS, mobilize -continue unasyn  -follow up cultures   Acute Metabolic Acidosis  Hypophosphatemia, hypokalemia, hypomagnesemia  -reduce IVF to 15ml/hr  -monitor, replace electrolytes as indicated   Seizure CT head NAICA, EEG neg  -seizure precautions   ETOH Abuse  -phenobarbital taper -CIWA protocol  -thiamine, folate, MVI   Hypotension Resolved.  -tele / BP monitoring  -troponin negative  -ECHO with normal LVEF, mild LVH  Bilateral Foot Pain Warm/Mild Swelling  -? Gout with ETOH  -add colchicine for 3 days to see if improved symptoms  Hyperglycemia  -SSI, moderate scale   Homelessness  -TOC consult  Best Practice (right click and "Reselect all SmartList Selections" daily)  Diet/type: NPO DVT prophylaxis: SCD GI prophylaxis: N/A Lines: Central line Foley:  N/A and Yes, and it is still needed Code Status:  full code Last date of multidisciplinary goals of care discussion: patient updated on plan of care 12/16  Tx out of ICU. To [facility] as of 12/17 am.   Critical care time: n/a    1/18, MSN, APRN, NP-C,  AGACNP-BC [place] [facility] 06/20/2022, 11:58 AM   Please see [email or web address] for pager details.   From 7A-7P if no response, please call [phone] After hours, please call ELink [phone]

## 2022-06-21 DIAGNOSIS — J9601 Acute respiratory failure with hypoxia: Secondary | ICD-10-CM

## 2022-06-21 DIAGNOSIS — G9341 Metabolic encephalopathy: Secondary | ICD-10-CM | POA: Insufficient documentation

## 2022-06-21 DIAGNOSIS — I10 Essential (primary) hypertension: Secondary | ICD-10-CM

## 2022-06-21 DIAGNOSIS — J9602 Acute respiratory failure with hypercapnia: Secondary | ICD-10-CM

## 2022-06-21 DIAGNOSIS — E876 Hypokalemia: Secondary | ICD-10-CM

## 2022-06-21 DIAGNOSIS — F10921 Alcohol use, unspecified with intoxication delirium: Secondary | ICD-10-CM

## 2022-06-21 DIAGNOSIS — J690 Pneumonitis due to inhalation of food and vomit: Secondary | ICD-10-CM

## 2022-06-21 LAB — COMPREHENSIVE METABOLIC PANEL
ALT: 41 U/L (ref 0–44)
AST: 75 U/L — ABNORMAL HIGH (ref 15–41)
Albumin: 3 g/dL — ABNORMAL LOW (ref 3.5–5.0)
Alkaline Phosphatase: 61 U/L (ref 38–126)
Anion gap: 10 (ref 5–15)
BUN: <5 mg/dL — ABNORMAL LOW (ref 6–20)
CO2: 24 mmol/L (ref 22–32)
Calcium: 8.6 mg/dL — ABNORMAL LOW (ref 8.9–10.3)
Chloride: 103 mmol/L (ref 98–111)
Creatinine, Ser: 0.71 mg/dL (ref 0.61–1.24)
GFR, Estimated: >60 mL/min (ref >60)
Glucose, Bld: 101 mg/dL — ABNORMAL HIGH (ref 70–99)
Potassium: 3.4 mmol/L — ABNORMAL LOW (ref 3.5–5.1)
Sodium: 137 mmol/L (ref 135–145)
Total Bilirubin: 1.9 mg/dL — ABNORMAL HIGH (ref 0.3–1.2)
Total Protein: 6.2 g/dL — ABNORMAL LOW (ref 6.5–8.1)

## 2022-06-21 LAB — CBC
HCT: 34.7 % — ABNORMAL LOW (ref 39.0–52.0)
Hemoglobin: 12.3 g/dL — ABNORMAL LOW (ref 13.0–17.0)
MCH: 34.1 pg — ABNORMAL HIGH (ref 26.0–34.0)
MCHC: 35.4 g/dL (ref 30.0–36.0)
MCV: 96.1 fL (ref 80.0–100.0)
Platelets: 150 10*3/uL (ref 150–400)
RBC: 3.61 MIL/uL — ABNORMAL LOW (ref 4.22–5.81)
RDW: 11.9 % (ref 11.5–15.5)
WBC: 15.7 10*3/uL — ABNORMAL HIGH (ref 4.0–10.5)
nRBC: 0 % (ref 0.0–0.2)

## 2022-06-21 LAB — CULTURE, BLOOD (ROUTINE X 2): Special Requests: ADEQUATE

## 2022-06-21 LAB — PHOSPHORUS: Phosphorus: 2.8 mg/dL (ref 2.5–4.6)

## 2022-06-21 LAB — GLUCOSE, CAPILLARY
Glucose-Capillary: 102 mg/dL — ABNORMAL HIGH (ref 70–99)
Glucose-Capillary: 92 mg/dL (ref 70–99)
Glucose-Capillary: 93 mg/dL (ref 70–99)

## 2022-06-21 LAB — MAGNESIUM: Magnesium: 2 mg/dL (ref 1.7–2.4)

## 2022-06-21 MED ORDER — AMLODIPINE BESYLATE 10 MG PO TABS
10.0000 mg | ORAL_TABLET | Freq: Every day | ORAL | Status: DC
  Administered 2022-06-21 – 2022-06-24 (×4): 10 mg via ORAL
  Filled 2022-06-21 (×4): qty 1

## 2022-06-21 MED ORDER — AMOXICILLIN-POT CLAVULANATE 875-125 MG PO TABS
1.0000 | ORAL_TABLET | Freq: Two times a day (BID) | ORAL | Status: AC
  Administered 2022-06-21 – 2022-06-23 (×6): 1 via ORAL
  Filled 2022-06-21 (×6): qty 1

## 2022-06-21 MED ORDER — POTASSIUM CHLORIDE CRYS ER 20 MEQ PO TBCR
40.0000 meq | EXTENDED_RELEASE_TABLET | ORAL | Status: AC
  Administered 2022-06-21 (×2): 40 meq via ORAL
  Filled 2022-06-21 (×2): qty 2

## 2022-06-21 MED ORDER — IBUPROFEN 200 MG PO TABS
400.0000 mg | ORAL_TABLET | Freq: Four times a day (QID) | ORAL | Status: DC | PRN
  Administered 2022-06-21 – 2022-06-22 (×2): 400 mg via ORAL
  Filled 2022-06-21 (×2): qty 2

## 2022-06-21 MED ORDER — LORAZEPAM 1 MG PO TABS
1.0000 mg | ORAL_TABLET | ORAL | Status: DC | PRN
  Administered 2022-06-22 – 2022-06-23 (×2): 1 mg via ORAL
  Filled 2022-06-21 (×2): qty 1

## 2022-06-21 MED ORDER — LISINOPRIL 20 MG PO TABS
20.0000 mg | ORAL_TABLET | Freq: Every day | ORAL | Status: DC
  Administered 2022-06-22 – 2022-06-24 (×3): 20 mg via ORAL
  Filled 2022-06-21 (×3): qty 1

## 2022-06-21 NOTE — Progress Notes (Signed)
Mobility Specialist Progress Note    06/21/22 1231  Mobility  Activity Ambulated with assistance in hallway  Level of Assistance Contact guard assist, steadying assist  Assistive Device Other (Comment) (IV pole)  Distance Ambulated (ft) 450 ft  Activity Response Tolerated well  Mobility Referral Yes  $Mobility charge 1 Mobility   Pre-Mobility: 115 HR, 140/117 (126) BP During Mobility: 134 HR Post-Mobility: 127 HR, 145/127 (135) BP  Pt received sitting EOB and agreeable. C/o leg pain. Returned to sitting EOB with call bell in reach. RN notified of BP.  [place] Mobility Specialist  Please Neurosurgeon or Rehab Office at [phone]

## 2022-06-21 NOTE — Progress Notes (Signed)
Progress Note   Patient: Brett Molina WUJ:[id number] DOB: 09-24-1990 DOA: 06/18/2022     3 DOS: the patient was seen and examined on 06/21/2022   Brief hospital course: [name] was admitted to the hospital with the working diagnosis of alcohol intoxication, complicated with aspiration pneumonitis/ pneumonia,and hypothermia.   31 yo male with past medical history of alcohol dependence, and withdrawal seizures who was brought to the ED after being found down. Apparently was in the ED 24 hrs prior with withdrawal symptoms but left against medical advice. Patient was intubated for airway protection, his blood pressure was 117/49, HR 70 RR 16 and 02 saturation 97%, temp 79.5, lungs with rhonchi at the right base, heart with S1 and S2 present and rhythmic, abdomen with no distention and no lower extremity edema, neurologically not responsive.   VBG pH 7.08/ Pc02 47/ P02 102/ bicarbonate 15/ 95% 02 sat.  Na 146, K 3.4 Cl 108 bicarbonate 16, glucose 199 bun 9 cr 0,92 anion gap 22.  Lactic acid 7,4 and 6.5  Wbc 17.2 hgb 12.1 plt 271  Sars covid 19 negative  Urine analysis SG 1,013, 100 protein, 20 ketones, pH 5.0, negative leukocytes, rbs 0-5, hgb dipstick moderate.  Alcohol level >600   CT head, cervical spine and maxillofacial with no acute changes.   Chest radiograph with no cardiomegaly, positive faint interstitial infiltrate at the right lower lobe. Positive ET tube above the carina and positive NG tube with tip below the diaphragm.   EKG 68 bpm, normal axis, right bundle branch block, qtc 582, sinus rhythm with no significant ST segment or T wave changes.   Patient was admitted to the ICU, placed on alcohol withdrawal protocol, thiamine, IV bicarbonate and IV antibiotic therapy.  IV keppra for suspected seizures and sedation with phenobarbital.   Required vasopressors for sedation induced hypotension.   12/15 Increased sedation for Vent Synch - Following commands, weaning  pressors. EEG was negative. Extubated.  EEG negative for acute seizures.   12/17 transferred to [facility].   Assessment and Plan: * Acute alcohol intoxication (HCC) Patient with toxic and metabolic encephalopathy with suspected alcohol induced seizure.  Currently patient is calm with no tremors.  EEG with no epileptiform abnormalities.   Plan to continue neuro checks per unit protocol. As needed lorazepam for anxiety. Discontinue Keppra.  Continue with phenobarbital taper.  Continue with thiamine and folic acid Nutritional support.  Will need outpatient support (alcoholic anonymous)  Social work Administrator, sports.    Acute respiratory failure with hypoxia and hypercapnia (HCC) Respiratory failure due to CNS dysfunction.  Patient required invasive mechanical ventilation.  Mentation has improved and he was liberated from ventilator on 12/15.  Continue oxymetry monitoring. PT, OT and out of bed to chair tid with meals.    Aspiration pneumonitis (HCC) Wbc is 15.7.  Oxygenation is 98% on room air.  On admission one bottle blood culture positive for staphylococcus hominis (possible contamination).  Plan to continue antibiotic therapy, change from Unasyn to Augmentin to complete 5 days.  Follow up on cell count and temperature curve.  Repeat blood cultures.   HTN (hypertension) Systolic blood pressure has been 130 and 140 with diastolic 90 to 110.  Will continue blood pressure control with lisinopril (decrease to 20 mg from 40 mg), and will add amlodipine.   Hold on diuretic therapy for now due to electrolyte abnormalities.   Hypokalemia Hypophosphatemia, hypomagnesemia, hypernatremia.   Renal function with serum cr at 0,71, K is 3,4 and serum  bicarbonate at 24, Na 137 and Cl 103.  P is 2,8 and Mg 2,0.   Plan to add 80 meq Kcl in two divided doses and follow up renal function in am.         Subjective: Patient with no chest pain or dyspnea, mild lower extremity pain, no frank  join pain,   Physical Exam: Vitals:   06/21/22 0303 06/21/22 0733 06/21/22 1132 06/21/22 1203  BP: 139/88 (!) 144/99 (!) 138/102 (!) 148/110  Pulse: 97 96    Resp: 16 18  16   Temp: 98.6 F (37 C) 98.5 F (36.9 C)  99.9 F (37.7 C)  TempSrc: Oral Oral  Oral  SpO2: 98%     Weight: 102.1 kg     Height:       Neurology awake and alert ENT with no pallor or icterus Cardiovascular with S1 and S2 present and rhythmic Respiratory with no rales or wheezing, no rhonchi Abdomen with no distention  No lower extremity edema No joint tenderness or inflammation  Data Reviewed:    Family Communication: no family at the bedside   Disposition: Status is: Inpatient Remains inpatient appropriate because: recovering from intoxication and respiratory failure, needs help with discharge planning, he is homeless   Planned Discharge Destination: Home     Author: , MD 06/21/2022 12:29 PM  For on call review [email or web address].

## 2022-06-21 NOTE — Assessment & Plan Note (Addendum)
Respiratory failure due to CNS dysfunction.  Patient required invasive mechanical ventilation.  Mentation has improved and he was liberated from ventilator on 12/15.  Continue oxymetry monitoring. PT, OT and out of bed to chair tid with meals.

## 2022-06-21 NOTE — Assessment & Plan Note (Addendum)
Systolic blood pressure has been 130 and 140 with diastolic 90 to 110.  Will continue blood pressure control with lisinopril (decrease to 20 mg from 40 mg), and will add amlodipine.   Hold on diuretic therapy for now due to electrolyte abnormalities.

## 2022-06-21 NOTE — Assessment & Plan Note (Signed)
Hypophosphatemia, hypomagnesemia, hypernatremia.   Renal function with serum cr at 0,71, K is 3,4 and serum bicarbonate at 24, Na 137 and Cl 103.  P is 2,8 and Mg 2,0.   Plan to add 80 meq Kcl in two divided doses and follow up renal function in am.

## 2022-06-21 NOTE — Assessment & Plan Note (Signed)
Wbc is 15.7.  Oxygenation is 98% on room air.  On admission one bottle blood culture positive for staphylococcus hominis (possible contamination).  Plan to continue antibiotic therapy, change from Unasyn to Augmentin to complete 5 days.  Follow up on cell count and temperature curve.  Repeat blood cultures.

## 2022-06-21 NOTE — Assessment & Plan Note (Addendum)
Patient with toxic and metabolic encephalopathy with suspected alcohol induced seizure.  Currently patient is calm with no tremors.  EEG with no epileptiform abnormalities.   Plan to continue neuro checks per unit protocol. As needed lorazepam for anxiety. Discontinue Keppra.  Continue with phenobarbital taper.  Continue with thiamine and folic acid Nutritional support.  Will need outpatient support (alcoholic anonymous)  Social work Administrator, sports.

## 2022-06-21 NOTE — Hospital Course (Addendum)
Brett Molina was admitted to the hospital with the working diagnosis of alcohol intoxication, complicated with aspiration pneumonitis/ pneumonia,and hypothermia.   31 yo male with past medical history of alcohol dependence, and withdrawal seizures who was brought to the ED after being found down. Apparently was in the ED 24 hrs prior with withdrawal symptoms but left against medical advice. Patient was intubated for airway protection, his blood pressure was 117/49, HR 70 RR 16 and 02 saturation 97%, temp 79.5, lungs with rhonchi at the right base, heart with S1 and S2 present and rhythmic, abdomen with no distention and no lower extremity edema, neurologically not responsive.   VBG pH 7.08/ Pc02 47/ P02 102/ bicarbonate 15/ 95% 02 sat.  Na 146, K 3.4 Cl 108 bicarbonate 16, glucose 199 bun 9 cr 0,92 anion gap 22.  Lactic acid 7,4 and 6.5  Wbc 17.2 hgb 12.1 plt 271  Sars covid 19 negative  Urine analysis SG 1,013, 100 protein, 20 ketones, pH 5.0, negative leukocytes, rbs 0-5, hgb dipstick moderate.  Alcohol level >600   CT head, cervical spine and maxillofacial with no acute changes.   Chest radiograph with no cardiomegaly, positive faint interstitial infiltrate at the right lower lobe. Positive ET tube above the carina and positive NG tube with tip below the diaphragm.   EKG 68 bpm, normal axis, right bundle branch block, qtc 582, sinus rhythm with no significant ST segment or T wave changes.   Patient was admitted to the ICU, placed on alcohol withdrawal protocol, thiamine, IV bicarbonate and IV antibiotic therapy.  IV keppra for suspected seizures and sedation with phenobarbital.   Required vasopressors for sedation induced hypotension.   12/15 Increased sedation for Vent Synch - Following commands, weaning pressors. EEG was negative. Extubated.  EEG negative for acute seizures.   12/17 transferred to [facility].

## 2022-06-22 LAB — CBC
HCT: 34.1 % — ABNORMAL LOW (ref 39.0–52.0)
Hemoglobin: 12.1 g/dL — ABNORMAL LOW (ref 13.0–17.0)
MCH: 34.4 pg — ABNORMAL HIGH (ref 26.0–34.0)
MCHC: 35.5 g/dL (ref 30.0–36.0)
MCV: 96.9 fL (ref 80.0–100.0)
Platelets: 178 10*3/uL (ref 150–400)
RBC: 3.52 MIL/uL — ABNORMAL LOW (ref 4.22–5.81)
RDW: 11.9 % (ref 11.5–15.5)
WBC: 13.4 10*3/uL — ABNORMAL HIGH (ref 4.0–10.5)
nRBC: 0 % (ref 0.0–0.2)

## 2022-06-22 LAB — BASIC METABOLIC PANEL
Anion gap: 8 (ref 5–15)
BUN: 7 mg/dL (ref 6–20)
CO2: 21 mmol/L — ABNORMAL LOW (ref 22–32)
Calcium: 8.6 mg/dL — ABNORMAL LOW (ref 8.9–10.3)
Chloride: 106 mmol/L (ref 98–111)
Creatinine, Ser: 0.75 mg/dL (ref 0.61–1.24)
GFR, Estimated: >60 mL/min (ref >60)
Glucose, Bld: 120 mg/dL — ABNORMAL HIGH (ref 70–99)
Potassium: 3.6 mmol/L (ref 3.5–5.1)
Sodium: 135 mmol/L (ref 135–145)

## 2022-06-22 LAB — TSH: TSH: 3.104 u[IU]/mL (ref 0.350–4.500)

## 2022-06-22 MED ORDER — METOPROLOL TARTRATE 12.5 MG HALF TABLET
12.5000 mg | ORAL_TABLET | Freq: Two times a day (BID) | ORAL | Status: DC
  Administered 2022-06-22 – 2022-06-24 (×5): 12.5 mg via ORAL
  Filled 2022-06-22 (×5): qty 1

## 2022-06-22 NOTE — Progress Notes (Signed)
CSW received consult for shelter resources and substance use resources. CSW spoke with patient at bedside. Patient reports he comes from apartment with significant other. Patient reports unable to return and is currently homeless. CSW offered patient shelter list. Patient accepted. CSW offered patient outpatient substance use treatment services resources. Patient declined. Patient requested clothes. CSW provided patient with clothes and socks from social work Control and instrumentation engineer. Patient accepted.All questions answered. No further questions reported at this time.

## 2022-06-22 NOTE — Progress Notes (Addendum)
PROGRESS NOTE    Brett Molina  ZOX:[id number] DOB: Feb 05, 1991 DOA: 06/18/2022 PCP: Pcp, No  Chief Complaint  Patient presents with   Unresponsive    Brief Narrative:  Brett Molina was admitted to the hospital with the working diagnosis of alcohol intoxication, complicated with aspiration pneumonitis/ pneumonia,and hypothermia.    31 yo male with past medical history of alcohol dependence, and withdrawal seizures who was brought to the ED after being found down. Apparently was in the ED 24 hrs prior with withdrawal symptoms but left against medical advice. Patient was intubated for airway protection, his blood pressure was 117/49, HR 70 RR 16 and 02 saturation 97%, temp 79.5, lungs with rhonchi at the right base, heart with S1 and S2 present and rhythmic, abdomen with no distention and no lower extremity edema, neurologically not responsive.    VBG pH 7.08/ Pc02 47/ P02 102/ bicarbonate 15/ 95% 02 sat.  Na 146, K 3.4 Cl 108 bicarbonate 16, glucose 199 bun 9 cr 0,92 anion gap 22.  Lactic acid 7,4 and 6.5  Wbc 17.2 hgb 12.1 plt 271  Sars covid 19 negative  Urine analysis SG 1,013, 100 protein, 20 ketones, pH 5.0, negative leukocytes, rbs 0-5, hgb dipstick moderate.  Alcohol level >600    CT head, cervical spine and maxillofacial with no acute changes.    Chest radiograph with no cardiomegaly, positive faint interstitial infiltrate at the right lower lobe. Positive ET tube above the carina and positive NG tube with tip below the diaphragm.    EKG 68 bpm, normal axis, right bundle branch block, qtc 582, sinus rhythm with no significant ST segment or T wave changes.    Patient was admitted to the ICU, placed on alcohol withdrawal protocol, thiamine, IV bicarbonate and IV antibiotic therapy.  IV keppra for suspected seizures and sedation with phenobarbital.    Required vasopressors for sedation induced hypotension.    12/15 Increased sedation for Vent Synch - Following commands,  weaning pressors. EEG was negative. Extubated.  EEG negative for acute seizures.    12/17 transferred to [facility].    Assessment & Plan:   Principal Problem:   Acute alcohol intoxication (HCC) Active Problems:   Acute respiratory failure with hypoxia and hypercapnia (HCC)   Aspiration pneumonitis (HCC)   HTN (hypertension)   Hypokalemia  Acute alcohol intoxication (HCC) Hx Etoh Withdrawal Seizures Hypothermia Found Down Patient with toxic and metabolic encephalopathy who was found hypothermic and unresponsive outside in cold Unclear events leading to this, ? Seizure with lip trauma EEG with no epileptiform abnormalities.  Plan to continue neuro checks per unit protocol. As needed lorazepam for anxiety. Keppra has been d/c'd Continue with phenobarbital taper.  Continue with thiamine and folic acid Nutritional support.  Will need outpatient support (alcoholic anonymous)  Social work Administrator, sports.   Acute respiratory failure with hypoxia and hypercapnia (HCC) Respiratory failure due to CNS dysfunction.  Patient required invasive mechanical ventilation.  Mentation has improved and he was liberated from ventilator on 12/15. Continue oxymetry monitoring. PT, OT and out of bed to chair tid with meals.    Sinus Tachycardia Echo with normal EF Follow TSH Will start low dose beta blocker Unclear etiology, most likely due to etoh withdrawal?  3 mm foreign body in LL lip I don't appreciate this on exam Monitor   Aspiration pneumonitis (HCC) Leukocytosis improving Continue augmentin Suspect positive blood culture with coag negative staph from 12/14 was Brett Molina contaminant   HTN (hypertension) Amlodipine 10 mg Lisinopril  20 mg follow   Hypokalemia Hypophosphatemia, hypomagnesemia, hypernatremia.     DVT prophylaxis: lovenox Code Status: full Family Communication: none at bedside Disposition:   Status is: Inpatient Remains inpatient appropriate because: continued sinus  tachycardia   Consultants:  PCCM  Procedures:  Echo IMPRESSIONS     1. Left ventricular ejection fraction, by estimation, is 60 to 65%. The  left ventricle has normal function. The left ventricle has no regional  wall motion abnormalities. There is mild left ventricular hypertrophy.  Indeterminate diastolic filling due to  E-Brett Molina fusion.   2. Right ventricular systolic function was not well visualized. The right  ventricular size is not well visualized.   3. The mitral valve is normal in structure. Trivial mitral valve  regurgitation. No evidence of mitral stenosis.   4. The aortic valve was not well visualized. Aortic valve regurgitation  is not visualized. No aortic stenosis is present.   EEG Impression and clinical correlation: This EEG was obtained while comatose and is abnormal due to severe diffuse slowing indicative of global cerebral dysfunction. Epileptiform abnormalities were not seen during this recording.   Intubation  Antimicrobials:  Anti-infectives (From admission, onward)    Start     Dose/Rate Route Frequency Ordered Stop   06/21/22 1315  amoxicillin-clavulanate (AUGMENTIN) 875-125 MG per tablet 1 tablet        1 tablet Oral 2 times daily 06/21/22 1224 06/24/22 0959   06/19/22 0930  Ampicillin-Sulbactam (UNASYN) 3 g in sodium chloride 0.9 % 100 mL IVPB  Status:  Discontinued        3 g 200 mL/hr over 30 Minutes Intravenous Every 6 hours 06/19/22 0915 06/21/22 1224   06/18/22 2200  Ampicillin-Sulbactam (UNASYN) 3 g in sodium chloride 0.9 % 100 mL IVPB  Status:  Discontinued        3 g 200 mL/hr over 30 Minutes Intravenous Every 12 hours 06/18/22 1253 06/19/22 0915   06/18/22 1130  ceFEPIme (MAXIPIME) 2 g in sodium chloride 0.9 % 100 mL IVPB        2 g 200 mL/hr over 30 Minutes Intravenous  Once 06/18/22 1127 06/18/22 1223   06/18/22 1130  vancomycin (VANCOREADY) IVPB 1750 mg/350 mL  Status:  Discontinued        1,750 mg 175 mL/hr over 120 Minutes Intravenous   Once 06/18/22 1127 06/18/22 1238       Subjective: No new complaints  Objective: Vitals:   06/22/22 0808 06/22/22 1029 06/22/22 1244 06/22/22 1622  BP: (!) 137/98 115/71 123/80 (!) 140/85  Pulse: 90  (!) 109 (!) 103  Resp: 18  18 18   Temp: 98.9 F (37.2 C)  98.9 F (37.2 C) 99.4 F (37.4 C)  TempSrc: Oral  Oral Oral  SpO2: 98%  100% 100%  Weight:      Height:        Intake/Output Summary (Last 24 hours) at 06/22/2022 1957 Last data filed at 06/21/2022 2000 Gross per 24 hour  Intake --  Output 300 ml  Net -300 ml   Filed Weights   06/20/22 0450 06/21/22 0303 06/22/22 0521  Weight: 105.9 kg 102.1 kg 100.5 kg    Examination:  General: No acute distress. Cardiovascular: RRR Lungs: unlabored Abdomen: Soft, nontender, nondistended Neurological: Alert and oriented 3. Moves all extremities 4 with equal strength. Cranial nerves II through XII grossly intact. Extremities: mild pedal edema  Data Reviewed: I have personally reviewed following labs and imaging studies  CBC: Recent Labs  Lab 06/18/22  1110 06/18/22 1117 06/18/22 2214 06/19/22 0406 06/20/22 0412 06/21/22 0601 06/22/22 0232  WBC 13.3*  --   --  17.2* 11.8* 15.7* 13.4*  NEUTROABS 9.6*  --   --   --   --   --   --   HGB 15.8   < > 13.3 12.1* 10.8* 12.3* 12.1*  HCT 48.5   < > 39.0 35.4* 31.3* 34.7* 34.1*  MCV 103.6*  --   --  95.7 97.5 96.1 96.9  PLT 331  --   --  271 162 150 178   < > = values in this interval not displayed.    Basic Metabolic Panel: Recent Labs  Lab 06/19/22 0406 06/19/22 1250 06/20/22 0412 06/20/22 2026 06/21/22 0601 06/22/22 0232  NA 143 145 142  --  137 135  K 2.9* 3.3* 3.7  --  3.4* 3.6  CL 106 106 107  --  103 106  CO2 19* 26 28  --  24 21*  GLUCOSE 225* 115* 87  --  101* 120*  BUN 6 <5* <5*  --  <5* 7  CREATININE 1.02 1.09 0.69  --  0.71 0.75  CALCIUM 7.5* 7.6* 8.3*  --  8.6* 8.6*  MG 1.5*  --  1.9  --  2.0  --   PHOS 1.9*  --  1.6* 2.4* 2.8  --      GFR: Estimated Creatinine Clearance: 166.3 mL/min (by C-G formula based on SCr of 0.75 mg/dL).  Liver Function Tests: Recent Labs  Lab 06/18/22 1110 06/19/22 0406 06/19/22 1250 06/20/22 0412 06/21/22 0601  AST 45* 54* 67* 77* 75*  ALT 23 34 37 34 41  ALKPHOS 58 46 50 45 61  BILITOT 0.3 0.5 0.3 1.0 1.9*  PROT 7.0 5.5* 5.9* 5.8* 6.2*  ALBUMIN 3.8 2.9* 3.2* 3.0* 3.0*    CBG: Recent Labs  Lab 06/20/22 2011 06/20/22 2323 06/21/22 0316 06/21/22 0730 06/21/22 1201  GLUCAP 112* 105* 102* 92 93     Recent Results (from the past 240 hour(s))  Blood culture (routine x 2)     Status: Abnormal   Collection Time: 06/18/22 11:38 AM   Specimen: BLOOD LEFT HAND  Result Value Ref Range Status   Specimen Description BLOOD LEFT HAND  Final   Special Requests   Final    BOTTLES DRAWN AEROBIC AND ANAEROBIC Blood Culture adequate volume   Culture  Setup Time   Final    GRAM POSITIVE COCCI IN CLUSTERS AEROBIC BOTTLE ONLY CRITICAL RESULT CALLED TO, READ BACK BY AND VERIFIED WITH: PHARMD LEDFORD 06/19/22 @ 0717 BY AB    Culture (Shella Lahman)  Final    STAPHYLOCOCCUS HOMINIS THE SIGNIFICANCE OF ISOLATING THIS ORGANISM FROM Irlanda Croghan SINGLE SET OF BLOOD CULTURES WHEN MULTIPLE SETS ARE DRAWN IS UNCERTAIN. PLEASE NOTIFY THE MICROBIOLOGY DEPARTMENT WITHIN ONE WEEK IF SPECIATION AND SENSITIVITIES ARE REQUIRED. Performed at [facility] Lab, [address], [place], [place] [address]    Report Status 06/21/2022 FINAL  Final  Blood Culture ID Panel (Reflexed)     Status: Abnormal   Collection Time: 06/18/22 11:38 AM  Result Value Ref Range Status   Enterococcus faecalis NOT DETECTED NOT DETECTED Final   Enterococcus Faecium NOT DETECTED NOT DETECTED Final   Listeria monocytogenes NOT DETECTED NOT DETECTED Final   Staphylococcus species DETECTED (Lurleen Soltero) NOT DETECTED Final    Comment: CRITICAL RESULT CALLED TO, READ BACK BY AND VERIFIED WITH: PHARMD LEDFORD 06/19/22 @ 0717 BY AB  Staphylococcus aureus  (BCID) NOT DETECTED NOT DETECTED Final   Staphylococcus epidermidis NOT DETECTED NOT DETECTED Final   Staphylococcus lugdunensis NOT DETECTED NOT DETECTED Final   Streptococcus species NOT DETECTED NOT DETECTED Final   Streptococcus agalactiae NOT DETECTED NOT DETECTED Final   Streptococcus pneumoniae NOT DETECTED NOT DETECTED Final   Streptococcus pyogenes NOT DETECTED NOT DETECTED Final   Jerred Zaremba.calcoaceticus-baumannii NOT DETECTED NOT DETECTED Final   Bacteroides fragilis NOT DETECTED NOT DETECTED Final   Enterobacterales NOT DETECTED NOT DETECTED Final   Enterobacter cloacae complex NOT DETECTED NOT DETECTED Final   Escherichia coli NOT DETECTED NOT DETECTED Final   Klebsiella aerogenes NOT DETECTED NOT DETECTED Final   Klebsiella oxytoca NOT DETECTED NOT DETECTED Final   Klebsiella pneumoniae NOT DETECTED NOT DETECTED Final   Proteus species NOT DETECTED NOT DETECTED Final   Salmonella species NOT DETECTED NOT DETECTED Final   Serratia marcescens NOT DETECTED NOT DETECTED Final   Haemophilus influenzae NOT DETECTED NOT DETECTED Final   Neisseria meningitidis NOT DETECTED NOT DETECTED Final   Pseudomonas aeruginosa NOT DETECTED NOT DETECTED Final   Stenotrophomonas maltophilia NOT DETECTED NOT DETECTED Final   Candida albicans NOT DETECTED NOT DETECTED Final   Candida auris NOT DETECTED NOT DETECTED Final   Candida glabrata NOT DETECTED NOT DETECTED Final   Candida krusei NOT DETECTED NOT DETECTED Final   Candida parapsilosis NOT DETECTED NOT DETECTED Final   Candida tropicalis NOT DETECTED NOT DETECTED Final   Cryptococcus neoformans/gattii NOT DETECTED NOT DETECTED Final    Comment: Performed at [facility], [address] [place], [place] [address]  Blood culture (routine x 2)     Status: None (Preliminary result)   Collection Time: 06/18/22 11:42 AM   Specimen: BLOOD  Result Value Ref Range Status   Specimen Description BLOOD RIGHT ANTECUBITAL  Final   Special Requests    Final    BOTTLES DRAWN AEROBIC AND ANAEROBIC Blood Culture adequate volume   Culture   Final    NO GROWTH 4 DAYS Performed at [facility], [address] [place], [place] [address]    Report Status PENDING  Incomplete  Urine Culture     Status: None   Collection Time: 06/18/22 11:51 AM   Specimen: Urine, Clean Catch  Result Value Ref Range Status   Specimen Description URINE, CLEAN CATCH  Final   Special Requests NONE  Final   Culture   Final    NO GROWTH Performed at [facility], [address], [place], [place] [address]    Report Status 06/19/2022 FINAL  Final  MRSA Next Gen by PCR, Nasal     Status: None   Collection Time: 06/18/22  1:12 PM   Specimen: Nasal Mucosa; Nasal Swab  Result Value Ref Range Status   MRSA by PCR Next Gen NOT DETECTED NOT DETECTED Final    Comment: (NOTE) The GeneXpert MRSA Assay (FDA approved for NASAL specimens only), is one component of Loxley Cibrian comprehensive MRSA colonization surveillance program. It is not intended to diagnose MRSA infection nor to guide or monitor treatment for MRSA infections. Test performance is not FDA approved in patients less than 42 years old. Performed at [facility], [address], [place], [place] [address]   Resp panel by RT-PCR (RSV, Flu Kadden Osterhout&B, Covid) Anterior Nasal Swab     Status: None   Collection Time: 06/19/22  5:40 AM   Specimen: Anterior Nasal Swab  Result Value Ref Range  Status   SARS Coronavirus 2 by RT PCR NEGATIVE NEGATIVE Final    Comment: (NOTE) SARS-CoV-2 target nucleic acids are NOT DETECTED.  The SARS-CoV-2 RNA is generally detectable in upper respiratory specimens during the acute phase of infection. The lowest concentration of SARS-CoV-2 viral copies this assay can detect is 138 copies/mL. Dequavion Follette negative result does not preclude SARS-Cov-2 infection and should not be used as the sole basis for treatment or other patient management decisions. Elona Yinger negative result may occur with   improper specimen collection/handling, submission of specimen other than nasopharyngeal swab, presence of viral mutation(s) within the areas targeted by this assay, and inadequate number of viral copies(<138 copies/mL). Makyna Niehoff negative result must be combined with clinical observations, patient history, and epidemiological information. The expected result is Negative.  Fact Sheet for Patients:  BloggerCourse.com  Fact Sheet for Healthcare Providers:  SeriousBroker.it  This test is no t yet approved or cleared by the [place] FDA and  has been authorized for detection and/or diagnosis of SARS-CoV-2 by FDA under an Emergency Use Authorization (EUA). This EUA will remain  in effect (meaning this test can be used) for the duration of the COVID-19 declaration under Section 564(b)(1) of the Act, 21 U.S.C.section 360bbb-3(b)(1), unless the authorization is terminated  or revoked sooner.       Influenza Bernyce Brimley by PCR NEGATIVE NEGATIVE Final   Influenza B by PCR NEGATIVE NEGATIVE Final    Comment: (NOTE) The Xpert Xpress SARS-CoV-2/FLU/RSV plus assay is intended as an aid in the diagnosis of influenza from Nasopharyngeal swab specimens and should not be used as Toshiro Hanken sole basis for treatment. Nasal washings and aspirates are unacceptable for Xpert Xpress SARS-CoV-2/FLU/RSV testing.  Fact Sheet for Patients: BloggerCourse.com  Fact Sheet for Healthcare Providers: SeriousBroker.it  This test is not yet approved or cleared by the [place] FDA and has been authorized for detection and/or diagnosis of SARS-CoV-2 by FDA under an Emergency Use Authorization (EUA). This EUA will remain in effect (meaning this test can be used) for the duration of the COVID-19 declaration under Section 564(b)(1) of the Act, 21 U.S.C. section 360bbb-3(b)(1), unless the authorization is terminated or revoked.      Resp Syncytial Virus by PCR NEGATIVE NEGATIVE Final    Comment: (NOTE) Fact Sheet for Patients: BloggerCourse.com  Fact Sheet for Healthcare Providers: SeriousBroker.it  This test is not yet approved or cleared by the [place] FDA and has been authorized for detection and/or diagnosis of SARS-CoV-2 by FDA under an Emergency Use Authorization (EUA). This EUA will remain in effect (meaning this test can be used) for the duration of the COVID-19 declaration under Section 564(b)(1) of the Act, 21 U.S.C. section 360bbb-3(b)(1), unless the authorization is terminated or revoked.  Performed at [facility] Lab, [address] [place], [place] [address]          Radiology Studies: No results found.      Scheduled Meds:  amLODipine  10 mg Oral Daily   amoxicillin-clavulanate  1 tablet Oral BID   enoxaparin (LOVENOX) injection  40 mg Subcutaneous Q24H   folic acid  1 mg Oral Daily   lisinopril  20 mg Oral Daily   metoprolol tartrate  12.5 mg Oral BID   multivitamin with minerals  1 tablet Oral Daily   phenobarbital  32.4 mg Oral Q8H   thiamine  100 mg Oral Daily   Continuous Infusions:   LOS: 4 days    Time spent: over 30 min  Lacretia Nicks, MD [facility]   To contact the attending provider between 7A-7P or the covering provider during after hours 7P-7A, please log into the web site [email or web address] and access using universal  password for that web site. If you do not have the password, please call the hospital operator.  06/22/2022, 7:57 PM

## 2022-06-22 NOTE — Progress Notes (Signed)
Entered room to fix telemetry leads, saw IV laying on sink w/catheter intact. Pt stated "It fell out". Pt prefers to not have IV replaced at this time. Pt educated on why IV needed (meds/emergency etc), Pt wants to talk to MD this am to see if needed. Will continue to monitor.

## 2022-06-22 NOTE — Evaluation (Signed)
Occupational Therapy Evaluation Patient Details Name: Brett Molina MRN: [id number] DOB: 17-Jan-1991 Today's Date: 06/22/2022   History of Present Illness 31 yo male admitted 12/14 after found down and unresponsive with hypothermia, metabolic acidosis and hypotension. Intubated 12/14-12/15. PMHx: EtOH use disorder, withdrawal seizures   Clinical Impression   Pt reports independence at baseline with ADLs and functional mobility, is homeless but plans to go to a shelter upon d/c. Pt mod I for ADLs and all aspects of mobility at time of OT evaluation. HR up to 127bpm with hallway ambulation, SpO2 100% post-ambulation. Pt presenting with impairments listed below, however has no acute OT needs at this time, will s/o. Anticipate no OT follow up needs at d/c.      Recommendations for follow up therapy are one component of a multi-disciplinary discharge planning process, led by the attending physician.  Recommendations may be updated based on patient status, additional functional criteria and insurance authorization.   Follow Up Recommendations  No OT follow up     Assistance Recommended at Discharge PRN  Patient can return home with the following Assist for transportation;Assistance with cooking/housework    Functional Status Assessment  Patient has had a recent decline in their functional status and demonstrates the ability to make significant improvements in function in a reasonable and predictable amount of time.  Equipment Recommendations  None recommended by OT    Recommendations for Other Services       Precautions / Restrictions Precautions Precautions: Fall;Other (comment) Precaution Comments: watch sats and HR Restrictions Weight Bearing Restrictions: No      Mobility Bed Mobility Overal bed mobility: Modified Independent                  Transfers Overall transfer level: Modified independent Equipment used: None Transfers: Sit to/from Stand Sit to Stand:  Modified independent (Device/Increase time)                  Balance Overall balance assessment: No apparent balance deficits (not formally assessed)                                         ADL either performed or assessed with clinical judgement   ADL Overall ADL's : Modified independent                                       General ADL Comments: pt completing UB dressing, simulated LB dressing, toilet transfer and tub transfer mod I during session     Vision   Vision Assessment?: No apparent visual deficits Additional Comments: able to read off sites from list of shelters     Perception Perception Perception Tested?: No   Praxis Praxis Praxis tested?: Not tested    Pertinent Vitals/Pain Pain Assessment Pain Assessment: Faces Pain Score: 2  Faces Pain Scale: Hurts a little bit Pain Location: bil feet Pain Descriptors / Indicators: Discomfort     Hand Dominance Right   Extremity/Trunk Assessment Upper Extremity Assessment Upper Extremity Assessment: Overall WFL for tasks assessed   Lower Extremity Assessment Lower Extremity Assessment: Defer to PT evaluation   Cervical / Trunk Assessment Cervical / Trunk Assessment: Normal   Communication Communication Communication: No difficulties   Cognition Arousal/Alertness: Awake/alert Behavior During Therapy: WFL for tasks assessed/performed Overall Cognitive Status: Within Functional  Limits for tasks assessed                                 General Comments: provides PLOF appropriately, follows all commands, recalls events from prior session with PT/mobility tech     General Comments  HR 127 with hallway mobility, SpO2 100% after mobility    Exercises     Shoulder Instructions      Home Living Family/patient expects to be discharged to:: Shelter/Homeless                                 Additional Comments: plans to go to a shelter  ([facility]      Prior Functioning/Environment Prior Level of Function : Independent/Modified Independent                        OT Problem List:        OT Treatment/Interventions:      OT Goals(Current goals can be found in the care plan section) Acute Rehab OT Goals Patient Stated Goal: none stated OT Goal Formulation: With patient Time For Goal Achievement: 07/06/22 Potential to Achieve Goals: Good  OT Frequency:      Co-evaluation              AM-PAC OT "6 Clicks" Daily Activity     Outcome Measure Help from another person eating meals?: None Help from another person taking care of personal grooming?: None Help from another person toileting, which includes using toliet, bedpan, or urinal?: None Help from another person bathing (including washing, rinsing, drying)?: A Little Help from another person to put on and taking off regular upper body clothing?: None Help from another person to put on and taking off regular lower body clothing?: A Little 6 Click Score: 22   End of Session Nurse Communication: Mobility status  Activity Tolerance: Patient tolerated treatment well Patient left: in bed;with call bell/phone within reach  OT Visit Diagnosis: Muscle weakness (generalized) (M62.81)                Time: 1100-1111 OT Time Calculation (min): 11 min Charges:  OT General Charges $OT Visit: 1 Visit OT Evaluation $OT Eval Low Complexity: 1 Low  Alvino Lechuga K, OTD, OTR/L SecureChat Preferred Acute Rehab [phone]   Leonor Darnell K Koonce 06/22/2022, 12:14 PM

## 2022-06-22 NOTE — Progress Notes (Signed)
Mobility Specialist - Progress Note   06/22/22 1533  Mobility  Activity Ambulated with assistance in hallway  Level of Assistance Standby assist, set-up cues, supervision of patient - no hands on  Assistive Device None  Distance Ambulated (ft) 450 ft  Activity Response Tolerated well  Mobility Referral Yes  $Mobility charge 1 Mobility   Pt was received in bed and agreeable to session. No complaints throughout. Pt was returned EOB with all needs met.  Franki Monte  Mobility Specialist Please contact via Solicitor or Rehab office at [phone]

## 2022-06-22 NOTE — Care Management (Addendum)
  Transition of Care [facility]) Screening Note   Patient Details  Name: Brett Molina Date of Birth: [birth year]   [facility]) CM/SW Contact:    Gala Lewandowsky, RN Phone Number: 06/22/2022, 10:11 AM    Transition of Care Department [facility]) has reviewed the patient. Patient is currently without Insurance or a PCP at this time- Case Manager has arranged PCP appointment with the Internal Medicine Clinic. Information will be placed on the AVS. Case Manager will follow for [facility] assistance for medication needs as the patient progresses.

## 2022-06-23 ENCOUNTER — Inpatient Hospital Stay (HOSPITAL_COMMUNITY): Payer: Medicaid Other

## 2022-06-23 LAB — COMPREHENSIVE METABOLIC PANEL
ALT: 69 U/L — ABNORMAL HIGH (ref 0–44)
AST: 102 U/L — ABNORMAL HIGH (ref 15–41)
Albumin: 3 g/dL — ABNORMAL LOW (ref 3.5–5.0)
Alkaline Phosphatase: 60 U/L (ref 38–126)
Anion gap: 10 (ref 5–15)
BUN: 8 mg/dL (ref 6–20)
CO2: 21 mmol/L — ABNORMAL LOW (ref 22–32)
Calcium: 9 mg/dL (ref 8.9–10.3)
Chloride: 105 mmol/L (ref 98–111)
Creatinine, Ser: 0.82 mg/dL (ref 0.61–1.24)
GFR, Estimated: >60 mL/min (ref >60)
Glucose, Bld: 99 mg/dL (ref 70–99)
Potassium: 3.7 mmol/L (ref 3.5–5.1)
Sodium: 136 mmol/L (ref 135–145)
Total Bilirubin: 0.6 mg/dL (ref 0.3–1.2)
Total Protein: 6.5 g/dL (ref 6.5–8.1)

## 2022-06-23 LAB — CBC WITH DIFFERENTIAL/PLATELET
Abs Immature Granulocytes: 0.07 10*3/uL (ref 0.00–0.07)
Basophils Absolute: 0 10*3/uL (ref 0.0–0.1)
Basophils Relative: 0 %
Eosinophils Absolute: 0.2 10*3/uL (ref 0.0–0.5)
Eosinophils Relative: 2 %
HCT: 33.4 % — ABNORMAL LOW (ref 39.0–52.0)
Hemoglobin: 11.9 g/dL — ABNORMAL LOW (ref 13.0–17.0)
Immature Granulocytes: 1 %
Lymphocytes Relative: 16 %
Lymphs Abs: 1.9 10*3/uL (ref 0.7–4.0)
MCH: 34.3 pg — ABNORMAL HIGH (ref 26.0–34.0)
MCHC: 35.6 g/dL (ref 30.0–36.0)
MCV: 96.3 fL (ref 80.0–100.0)
Monocytes Absolute: 1.2 10*3/uL — ABNORMAL HIGH (ref 0.1–1.0)
Monocytes Relative: 11 %
Neutro Abs: 8 10*3/uL — ABNORMAL HIGH (ref 1.7–7.7)
Neutrophils Relative %: 70 %
Platelets: 176 10*3/uL (ref 150–400)
RBC: 3.47 MIL/uL — ABNORMAL LOW (ref 4.22–5.81)
RDW: 11.8 % (ref 11.5–15.5)
WBC: 11.3 10*3/uL — ABNORMAL HIGH (ref 4.0–10.5)
nRBC: 0 % (ref 0.0–0.2)

## 2022-06-23 LAB — CULTURE, BLOOD (ROUTINE X 2)
Culture: NO GROWTH
Special Requests: ADEQUATE

## 2022-06-23 LAB — MAGNESIUM: Magnesium: 2 mg/dL (ref 1.7–2.4)

## 2022-06-23 LAB — D-DIMER, QUANTITATIVE: D-Dimer, Quant: 4.7 ug/mL-FEU — ABNORMAL HIGH (ref 0.00–0.50)

## 2022-06-23 LAB — PHOSPHORUS: Phosphorus: 3 mg/dL (ref 2.5–4.6)

## 2022-06-23 MED ORDER — IOHEXOL 350 MG/ML SOLN
75.0000 mL | Freq: Once | INTRAVENOUS | Status: AC | PRN
  Administered 2022-06-24: 75 mL via INTRAVENOUS

## 2022-06-23 NOTE — Progress Notes (Signed)
Secure chat sent to nurse to f/u regarding request for 18g. This patient has no further vasculature for an 18g PIV. Day nurse was also notified of this. Notified night nurse to contact MD. Tomasita Morrow, RN VAST

## 2022-06-23 NOTE — Progress Notes (Signed)
Mobility Specialist - Progress Note   06/23/22 1456  Mobility  Activity Ambulated with assistance in hallway  Level of Assistance Standby assist, set-up cues, supervision of patient - no hands on  Assistive Device None  Distance Ambulated (ft) 450 ft  Activity Response Tolerated well  Mobility Referral Yes  $Mobility charge 1 Mobility   Pt received in bed and agreeable to session. No complaints throughout. Pt was returned to room with all needs met.  Franki Monte  Mobility Specialist Please contact via Solicitor or Rehab office at [phone]

## 2022-06-23 NOTE — Progress Notes (Addendum)
PROGRESS NOTE    Brett Molina  ZOX:[id number] DOB: 1990/10/30 DOA: 06/18/2022 PCP: Pcp, No  Chief Complaint  Patient presents with   Unresponsive    Brief Narrative:  Brett Molina was admitted to the hospital with the working diagnosis of alcohol intoxication, complicated with aspiration pneumonitis/ pneumonia,and hypothermia.    31 yo male with past medical history of alcohol dependence, and withdrawal seizures who was brought to the ED after being found down. Apparently was in the ED 24 hrs prior with withdrawal symptoms but left against medical advice. Patient was intubated for airway protection, his blood pressure was 117/49, HR 70 RR 16 and 02 saturation 97%, temp 79.5, lungs with rhonchi at the right base, heart with S1 and S2 present and rhythmic, abdomen with no distention and no lower extremity edema, neurologically not responsive.    VBG pH 7.08/ Pc02 47/ P02 102/ bicarbonate 15/ 95% 02 sat.  Na 146, K 3.4 Cl 108 bicarbonate 16, glucose 199 bun 9 cr 0,92 anion gap 22.  Lactic acid 7,4 and 6.5  Wbc 17.2 hgb 12.1 plt 271  Sars covid 19 negative  Urine analysis SG 1,013, 100 protein, 20 ketones, pH 5.0, negative leukocytes, rbs 0-5, hgb dipstick moderate.  Alcohol level >600    CT head, cervical spine and maxillofacial with no acute changes.    Chest radiograph with no cardiomegaly, positive faint interstitial infiltrate at the right lower lobe. Positive ET tube above the carina and positive NG tube with tip below the diaphragm.    EKG 68 bpm, normal axis, right bundle branch block, qtc 582, sinus rhythm with no significant ST segment or T wave changes.    Patient was admitted to the ICU, placed on alcohol withdrawal protocol, thiamine, IV bicarbonate and IV antibiotic therapy.  IV keppra for suspected seizures and sedation with phenobarbital.    Required vasopressors for sedation induced hypotension.    12/15 Increased sedation for Vent Synch - Following commands,  weaning pressors. EEG was negative. Extubated.  EEG negative for acute seizures.    12/17 transferred to [facility].   Awaiting CT PE protocol to r/o PE given elevated d dimer and exaggerated sinus tachycardia.     Assessment & Plan:   Principal Problem:   Acute alcohol intoxication (HCC) Active Problems:   Acute respiratory failure with hypoxia and hypercapnia (HCC)   Aspiration pneumonitis (HCC)   HTN (hypertension)   Hypokalemia  Acute alcohol intoxication (HCC) Hx Etoh Withdrawal Seizures Hypothermia Found Down Patient with toxic and metabolic encephalopathy who was found hypothermic and unresponsive outside in cold Unclear events leading to this, ? Seizure with lip trauma EEG with no epileptiform abnormalities.  Plan to continue neuro checks per unit protocol. As needed lorazepam for anxiety. Keppra has been d/c'd Continue with phenobarbital taper.  Continue with thiamine and folic acid Nutritional support.  Will need outpatient support (alcoholic anonymous)  Social work Administrator, sports.   Acute respiratory failure with hypoxia and hypercapnia (HCC) Respiratory failure due to CNS dysfunction.  Patient required invasive mechanical ventilation.  Mentation has improved and he was liberated from ventilator on 12/15. Continue oxymetry monitoring. PT, OT and out of bed to chair tid with meals.    Sinus Tachycardia Echo with normal EF Follow TSH (wnl) D dimer is elevated -> r/o PE Will start low dose beta blocker Unclear etiology, most likely due to etoh withdrawal?  3 mm foreign body in LL lip I don't appreciate this on exam Will discuss with rads ->  recommended following symptomatically and will follow facial x ray  Aspiration pneumonitis (HCC) Leukocytosis improving Continue augmentin Suspect positive blood culture with coag negative staph from 12/14 was Keshia Weare contaminant   HTN (hypertension) Amlodipine 10 mg Lisinopril 20 mg metoprolol    Hypokalemia Hypophosphatemia, hypomagnesemia, hypernatremia.     DVT prophylaxis: lovenox Code Status: full Family Communication: none at bedside Disposition:   Status is: Inpatient Remains inpatient appropriate because: continued sinus tachycardia   Consultants:  PCCM  Procedures:  Echo IMPRESSIONS     1. Left ventricular ejection fraction, by estimation, is 60 to 65%. The  left ventricle has normal function. The left ventricle has no regional  wall motion abnormalities. There is mild left ventricular hypertrophy.  Indeterminate diastolic filling due to  E-Bayli Quesinberry fusion.   2. Right ventricular systolic function was not well visualized. The right  ventricular size is not well visualized.   3. The mitral valve is normal in structure. Trivial mitral valve  regurgitation. No evidence of mitral stenosis.   4. The aortic valve was not well visualized. Aortic valve regurgitation  is not visualized. No aortic stenosis is present.   EEG Impression and clinical correlation: This EEG was obtained while comatose and is abnormal due to severe diffuse slowing indicative of global cerebral dysfunction. Epileptiform abnormalities were not seen during this recording.   Intubation  Antimicrobials:  Anti-infectives (From admission, onward)    Start     Dose/Rate Route Frequency Ordered Stop   06/21/22 1315  amoxicillin-clavulanate (AUGMENTIN) 875-125 MG per tablet 1 tablet        1 tablet Oral 2 times daily 06/21/22 1224 06/24/22 0959   06/19/22 0930  Ampicillin-Sulbactam (UNASYN) 3 g in sodium chloride 0.9 % 100 mL IVPB  Status:  Discontinued        3 g 200 mL/hr over 30 Minutes Intravenous Every 6 hours 06/19/22 0915 06/21/22 1224   06/18/22 2200  Ampicillin-Sulbactam (UNASYN) 3 g in sodium chloride 0.9 % 100 mL IVPB  Status:  Discontinued        3 g 200 mL/hr over 30 Minutes Intravenous Every 12 hours 06/18/22 1253 06/19/22 0915   06/18/22 1130  ceFEPIme (MAXIPIME) 2 g in sodium  chloride 0.9 % 100 mL IVPB        2 g 200 mL/hr over 30 Minutes Intravenous  Once 06/18/22 1127 06/18/22 1223   06/18/22 1130  vancomycin (VANCOREADY) IVPB 1750 mg/350 mL  Status:  Discontinued        1,750 mg 175 mL/hr over 120 Minutes Intravenous  Once 06/18/22 1127 06/18/22 1238       Subjective: No complaints  Objective: Vitals:   06/23/22 0504 06/23/22 0801 06/23/22 0945 06/23/22 0951  BP:  138/87 123/80 123/80  Pulse:  (!) 103  (!) 103  Resp: 19 19  19   Temp: 99.2 F (37.3 C) 97.6 F (36.4 C)  97.6 F (36.4 C)  TempSrc: Oral Oral    SpO2: 97% 96%    Weight: 100.2 kg     Height:       No intake or output data in the 24 hours ending 06/23/22 2016  Filed Weights   06/21/22 0303 06/22/22 0521 06/23/22 0504  Weight: 102.1 kg 100.5 kg 100.2 kg    Examination:  General: No acute distress. Cardiovascular: tachy, regular Lungs: unlabored Abdomen: unlabored Neurological: Alert and oriented 3. Moves all extremities 4 with equal strength. Cranial nerves II through XII grossly intact. Extremities: No clubbing or cyanosis. No edema.  Data Reviewed: I have personally reviewed following labs and imaging studies  CBC: Recent Labs  Lab 06/18/22 1110 06/18/22 1117 06/19/22 0406 06/20/22 0412 06/21/22 0601 06/22/22 0232 06/23/22 0149  WBC 13.3*  --  17.2* 11.8* 15.7* 13.4* 11.3*  NEUTROABS 9.6*  --   --   --   --   --  8.0*  HGB 15.8   < > 12.1* 10.8* 12.3* 12.1* 11.9*  HCT 48.5   < > 35.4* 31.3* 34.7* 34.1* 33.4*  MCV 103.6*  --  95.7 97.5 96.1 96.9 96.3  PLT 331  --  271 162 150 178 176   < > = values in this interval not displayed.    Basic Metabolic Panel: Recent Labs  Lab 06/19/22 0406 06/19/22 1250 06/20/22 0412 06/20/22 2026 06/21/22 0601 06/22/22 0232 06/23/22 0149  NA 143 145 142  --  137 135 136  K 2.9* 3.3* 3.7  --  3.4* 3.6 3.7  CL 106 106 107  --  103 106 105  CO2 19* 26 28  --  24 21* 21*  GLUCOSE 225* 115* 87  --  101* 120* 99  BUN 6  <5* <5*  --  <5* 7 8  CREATININE 1.02 1.09 0.69  --  0.71 0.75 0.82  CALCIUM 7.5* 7.6* 8.3*  --  8.6* 8.6* 9.0  MG 1.5*  --  1.9  --  2.0  --  2.0  PHOS 1.9*  --  1.6* 2.4* 2.8  --  3.0    GFR: Estimated Creatinine Clearance: 162 mL/min (by C-G formula based on SCr of 0.82 mg/dL).  Liver Function Tests: Recent Labs  Lab 06/19/22 0406 06/19/22 1250 06/20/22 0412 06/21/22 0601 06/23/22 0149  AST 54* 67* 77* 75* 102*  ALT 34 37 34 41 69*  ALKPHOS 46 50 45 61 60  BILITOT 0.5 0.3 1.0 1.9* 0.6  PROT 5.5* 5.9* 5.8* 6.2* 6.5  ALBUMIN 2.9* 3.2* 3.0* 3.0* 3.0*    CBG: Recent Labs  Lab 06/20/22 2011 06/20/22 2323 06/21/22 0316 06/21/22 0730 06/21/22 1201  GLUCAP 112* 105* 102* 92 93     Recent Results (from the past 240 hour(s))  Blood culture (routine x 2)     Status: Abnormal   Collection Time: 06/18/22 11:38 AM   Specimen: BLOOD LEFT HAND  Result Value Ref Range Status   Specimen Description BLOOD LEFT HAND  Final   Special Requests   Final    BOTTLES DRAWN AEROBIC AND ANAEROBIC Blood Culture adequate volume   Culture  Setup Time   Final    GRAM POSITIVE COCCI IN CLUSTERS AEROBIC BOTTLE ONLY CRITICAL RESULT CALLED TO, READ BACK BY AND VERIFIED WITH: PHARMD LEDFORD 06/19/22 @ 0717 BY AB    Culture (Emersynn Deatley)  Final    STAPHYLOCOCCUS HOMINIS THE SIGNIFICANCE OF ISOLATING THIS ORGANISM FROM Solace Manwarren SINGLE SET OF BLOOD CULTURES WHEN MULTIPLE SETS ARE DRAWN IS UNCERTAIN. PLEASE NOTIFY THE MICROBIOLOGY DEPARTMENT WITHIN ONE WEEK IF SPECIATION AND SENSITIVITIES ARE REQUIRED. Performed at [facility], [address], [place], [place] [address]    Report Status 06/21/2022 FINAL  Final  Blood Culture ID Panel (Reflexed)     Status: Abnormal   Collection Time: 06/18/22 11:38 AM  Result Value Ref Range Status   Enterococcus faecalis NOT DETECTED NOT DETECTED Final   Enterococcus Faecium NOT DETECTED NOT DETECTED Final   Listeria monocytogenes NOT DETECTED NOT DETECTED Final    Staphylococcus species DETECTED (Zaydah Nawabi) NOT DETECTED Final  Comment: CRITICAL RESULT CALLED TO, READ BACK BY AND VERIFIED WITH: PHARMD LEDFORD 06/19/22 @ 0717 BY AB    Staphylococcus aureus (BCID) NOT DETECTED NOT DETECTED Final   Staphylococcus epidermidis NOT DETECTED NOT DETECTED Final   Staphylococcus lugdunensis NOT DETECTED NOT DETECTED Final   Streptococcus species NOT DETECTED NOT DETECTED Final   Streptococcus agalactiae NOT DETECTED NOT DETECTED Final   Streptococcus pneumoniae NOT DETECTED NOT DETECTED Final   Streptococcus pyogenes NOT DETECTED NOT DETECTED Final   Elbie Statzer.calcoaceticus-baumannii NOT DETECTED NOT DETECTED Final   Bacteroides fragilis NOT DETECTED NOT DETECTED Final   Enterobacterales NOT DETECTED NOT DETECTED Final   Enterobacter cloacae complex NOT DETECTED NOT DETECTED Final   Escherichia coli NOT DETECTED NOT DETECTED Final   Klebsiella aerogenes NOT DETECTED NOT DETECTED Final   Klebsiella oxytoca NOT DETECTED NOT DETECTED Final   Klebsiella pneumoniae NOT DETECTED NOT DETECTED Final   Proteus species NOT DETECTED NOT DETECTED Final   Salmonella species NOT DETECTED NOT DETECTED Final   Serratia marcescens NOT DETECTED NOT DETECTED Final   Haemophilus influenzae NOT DETECTED NOT DETECTED Final   Neisseria meningitidis NOT DETECTED NOT DETECTED Final   Pseudomonas aeruginosa NOT DETECTED NOT DETECTED Final   Stenotrophomonas maltophilia NOT DETECTED NOT DETECTED Final   Candida albicans NOT DETECTED NOT DETECTED Final   Candida auris NOT DETECTED NOT DETECTED Final   Candida glabrata NOT DETECTED NOT DETECTED Final   Candida krusei NOT DETECTED NOT DETECTED Final   Candida parapsilosis NOT DETECTED NOT DETECTED Final   Candida tropicalis NOT DETECTED NOT DETECTED Final   Cryptococcus neoformans/gattii NOT DETECTED NOT DETECTED Final    Comment: Performed at [facility], [address] [place], [place] [address]  Blood culture (routine x 2)     Status:  None   Collection Time: 06/18/22 11:42 AM   Specimen: BLOOD  Result Value Ref Range Status   Specimen Description BLOOD RIGHT ANTECUBITAL  Final   Special Requests   Final    BOTTLES DRAWN AEROBIC AND ANAEROBIC Blood Culture adequate volume   Culture   Final    NO GROWTH 5 DAYS Performed at [facility], [address] [place], [place] [address]    Report Status 06/23/2022 FINAL  Final  Urine Culture     Status: None   Collection Time: 06/18/22 11:51 AM   Specimen: Urine, Clean Catch  Result Value Ref Range Status   Specimen Description URINE, CLEAN CATCH  Final   Special Requests NONE  Final   Culture   Final    NO GROWTH Performed at [facility], [address], [place], [place] [address]    Report Status 06/19/2022 FINAL  Final  MRSA Next Gen by PCR, Nasal     Status: None   Collection Time: 06/18/22  1:12 PM   Specimen: Nasal Mucosa; Nasal Swab  Result Value Ref Range Status   MRSA by PCR Next Gen NOT DETECTED NOT DETECTED Final    Comment: (NOTE) The GeneXpert MRSA Assay (FDA approved for NASAL specimens only), is one component of Jamorian Dimaria comprehensive MRSA colonization surveillance program. It is not intended to diagnose MRSA infection nor to guide or monitor treatment for MRSA infections. Test performance is not FDA approved in patients less than 6 years old. Performed at [facility], [address], [place], [place] [address]   Resp panel by RT-PCR (RSV, Flu Araceli Arango&B, Covid) Anterior Nasal Swab     Status: None  Collection Time: 06/19/22  5:40 AM   Specimen: Anterior Nasal Swab  Result Value Ref Range Status   SARS Coronavirus 2 by RT PCR NEGATIVE NEGATIVE Final    Comment: (NOTE) SARS-CoV-2 target nucleic acids are NOT DETECTED.  The SARS-CoV-2 RNA is generally detectable in upper respiratory specimens during the acute phase of infection. The lowest concentration of SARS-CoV-2 viral copies this assay can detect is 138 copies/mL. Michiel Sivley negative result  does not preclude SARS-Cov-2 infection and should not be used as the sole basis for treatment or other patient management decisions. Chasya Zenz negative result may occur with  improper specimen collection/handling, submission of specimen other than nasopharyngeal swab, presence of viral mutation(s) within the areas targeted by this assay, and inadequate number of viral copies(<138 copies/mL). Basha Krygier negative result must be combined with clinical observations, patient history, and epidemiological information. The expected result is Negative.  Fact Sheet for Patients:  BloggerCourse.com  Fact Sheet for Healthcare Providers:  SeriousBroker.it  This test is no t yet approved or cleared by the [place] FDA and  has been authorized for detection and/or diagnosis of SARS-CoV-2 by FDA under an Emergency Use Authorization (EUA). This EUA will remain  in effect (meaning this test can be used) for the duration of the COVID-19 declaration under Section 564(b)(1) of the Act, 21 U.S.C.section 360bbb-3(b)(1), unless the authorization is terminated  or revoked sooner.       Influenza Arlie Posch by PCR NEGATIVE NEGATIVE Final   Influenza B by PCR NEGATIVE NEGATIVE Final    Comment: (NOTE) The Xpert Xpress SARS-CoV-2/FLU/RSV plus assay is intended as an aid in the diagnosis of influenza from Nasopharyngeal swab specimens and should not be used as Astaria Nanez sole basis for treatment. Nasal washings and aspirates are unacceptable for Xpert Xpress SARS-CoV-2/FLU/RSV testing.  Fact Sheet for Patients: BloggerCourse.com  Fact Sheet for Healthcare Providers: SeriousBroker.it  This test is not yet approved or cleared by the [place] FDA and has been authorized for detection and/or diagnosis of SARS-CoV-2 by FDA under an Emergency Use Authorization (EUA). This EUA will remain in effect (meaning this test can be used) for  the duration of the COVID-19 declaration under Section 564(b)(1) of the Act, 21 U.S.C. section 360bbb-3(b)(1), unless the authorization is terminated or revoked.     Resp Syncytial Virus by PCR NEGATIVE NEGATIVE Final    Comment: (NOTE) Fact Sheet for Patients: BloggerCourse.com  Fact Sheet for Healthcare Providers: SeriousBroker.it  This test is not yet approved or cleared by the [place] FDA and has been authorized for detection and/or diagnosis of SARS-CoV-2 by FDA under an Emergency Use Authorization (EUA). This EUA will remain in effect (meaning this test can be used) for the duration of the COVID-19 declaration under Section 564(b)(1) of the Act, 21 U.S.C. section 360bbb-3(b)(1), unless the authorization is terminated or revoked.  Performed at [facility], [address] [place], [place] [address]          Radiology Studies: No results found.      Scheduled Meds:  amLODipine  10 mg Oral Daily   amoxicillin-clavulanate  1 tablet Oral BID   enoxaparin (LOVENOX) injection  40 mg Subcutaneous Q24H   folic acid  1 mg Oral Daily   lisinopril  20 mg Oral Daily   metoprolol tartrate  12.5 mg Oral BID   multivitamin with minerals  1 tablet Oral Daily   thiamine  100 mg Oral Daily   Continuous Infusions:   LOS: 5 days  Time spent: over 30 min    Lacretia Nicks, MD [facility]   To contact the attending provider between 7A-7P or the covering provider during after hours 7P-7A, please log into the web site [email or web address] and access using universal [place] password for that web site. If you do not have the password, please call the hospital operator.  06/23/2022, 8:16 PM

## 2022-06-23 NOTE — Progress Notes (Signed)
Physical Therapy Discharge Patient Details Name: Brett Molina MRN: [id number] DOB: 04-12-1991 Today's Date: 06/23/2022 Time: 6962-9528 PT Time Calculation (min) (ACUTE ONLY): 15 min  Patient discharged from PT services secondary to goals met and no further PT needs identified.  Please see latest therapy progress note for current level of functioning and progress toward goals.    Progress and discharge plan discussed with patient and/or caregiver: Patient/Caregiver agrees with plan  GP    Dani Gobble. Migdalia Dk PT, DPT Acute Rehabilitation Services Please use secure chat or  Call Office [phone]  [place] 06/23/2022, 3:57 PM

## 2022-06-23 NOTE — Progress Notes (Signed)
Physical Therapy Treatment Patient Details Name: Brett Molina MRN: [id number] DOB: 1991/01/14 Today's Date: 06/23/2022   History of Present Illness 31 yo male admitted 12/14 after found down and unresponsive with hypothermia, metabolic acidosis and hypotension. Intubated 12/14-12/15. PMHx: EtOH use disorder, withdrawal seizures    PT Comments    Pt sitting on EoB agreeable to work with therapy. Pt able to ambulate independently, with dynamic balance challenges as well as ascend/descend stairs, with use of handrail. Pt limited by tachycardia in the 140s with ambulation and stairs. Pt reports he does not feel like his heart is racing. PT will discharge from caseload and encouraged pt to ambulate with Mobility Specialist on the floor.     Recommendations for follow up therapy are one component of a multi-disciplinary discharge planning process, led by the attending physician.  Recommendations may be updated based on patient status, additional functional criteria and insurance authorization.  Follow Up Recommendations  No PT follow up     Assistance Recommended at Discharge PRN     Equipment Recommendations  None recommended by PT       Precautions / Restrictions Precautions Precautions: Fall;Other (comment) Precaution Comments: watch sats and HR Restrictions Weight Bearing Restrictions: No     Mobility  Bed Mobility               General bed mobility comments: sitting EoB    Transfers Overall transfer level: Independent Equipment used: None Transfers: Sit to/from Stand Sit to Stand: Independent           General transfer comment: good power up and self steadying    Ambulation/Gait Ambulation/Gait assistance: Modified independent (Device/Increase time) Gait Distance (Feet): 600 Feet Assistive device: None Gait Pattern/deviations: Step-through pattern, Decreased stride length, WFL(Within Functional Limits) Gait velocity: WFL Gait velocity  interpretation: >2.62 ft/sec, indicative of community ambulatory   General Gait Details: mild instability with gait with drop in sats to 87% on RA with limited 10' of gait. Returned to 2L with SPO2 maintained 94% with activity. Cues for safety and direction   Stairs Stairs: Yes Stairs assistance: Modified independent (Device/Increase time) Stair Management: One rail Right, One rail Left, Backwards, Alternating pattern Number of Stairs: 7 General stair comments: with use of handrail on L going down and R going up pt able to perform stairs with steady step over step pattern         Balance Overall balance assessment: No apparent balance deficits (not formally assessed)                                          Cognition Arousal/Alertness: Awake/alert Behavior During Therapy: WFL for tasks assessed/performed Overall Cognitive Status: Within Functional Limits for tasks assessed Area of Impairment: Orientation                 Orientation Level: Disoriented to, Time, Situation             General Comments: provides PLOF appropriately, follows all commands, recalls events from prior session with PT/mobility tech           General Comments General comments (skin integrity, edema, etc.): HR in mid 140s with ambulation in hallway and ascent/descent of steps      Pertinent Vitals/Pain Pain Assessment Pain Assessment: Faces Faces Pain Scale: Hurts a little bit Pain Location: bil feet Pain Descriptors / Indicators: Discomfort  PT Goals (current goals can now be found in the care plan section) Acute Rehab PT Goals Patient Stated Goal: find a place to stay PT Goal Formulation: With patient Time For Goal Achievement: 06/26/22 Potential to Achieve Goals: Good Progress towards PT goals: Goals met/education completed, patient discharged from PT    Frequency    Min 3X/week      PT Plan Current plan remains appropriate       AM-PAC PT "6  Clicks" Mobility   Outcome Measure  Help needed turning from your back to your side while in a flat bed without using bedrails?: None Help needed moving from lying on your back to sitting on the side of a flat bed without using bedrails?: None Help needed moving to and from a bed to a chair (including a wheelchair)?: None Help needed standing up from a chair using your arms (e.g., wheelchair or bedside chair)?: None Help needed to walk in hospital room?: None Help needed climbing 3-5 steps with a railing? : None 6 Click Score: 24    End of Session Equipment Utilized During Treatment: Gait belt;Oxygen Activity Tolerance: Patient tolerated treatment well Patient left: in chair;with call bell/phone within reach;with chair alarm set;with nursing/sitter in room Nurse Communication: Mobility status PT Visit Diagnosis: Other abnormalities of gait and mobility (R26.89)     Time: 2395-3202 PT Time Calculation (min) (ACUTE ONLY): 15 min  Charges:  $Gait Training: 8-22 mins                     Keshon Markovitz B. Migdalia Dk PT, DPT Acute Rehabilitation Services Please use secure chat or  Call Office [phone]    [place] 06/23/2022, 3:59 PM

## 2022-06-24 ENCOUNTER — Other Ambulatory Visit (HOSPITAL_COMMUNITY): Payer: Self-pay

## 2022-06-24 ENCOUNTER — Encounter (HOSPITAL_COMMUNITY): Payer: Self-pay

## 2022-06-24 DIAGNOSIS — F10921 Alcohol use, unspecified with intoxication delirium: Secondary | ICD-10-CM

## 2022-06-24 MED ORDER — LISINOPRIL 20 MG PO TABS
20.0000 mg | ORAL_TABLET | Freq: Every day | ORAL | 0 refills | Status: DC
  Filled 2022-06-24: qty 30, 30d supply, fill #0

## 2022-06-24 MED ORDER — ENOXAPARIN SODIUM 150 MG/ML IJ SOSY
150.0000 mg | PREFILLED_SYRINGE | Freq: Every day | INTRAMUSCULAR | Status: DC
  Administered 2022-06-24: 150 mg via SUBCUTANEOUS
  Filled 2022-06-24: qty 1

## 2022-06-24 MED ORDER — APIXABAN (ELIQUIS) VTE STARTER PACK (10MG AND 5MG)
ORAL_TABLET | ORAL | 0 refills | Status: DC
  Filled 2022-06-24: qty 74, 30d supply, fill #0

## 2022-06-24 MED ORDER — ENOXAPARIN SODIUM 100 MG/ML IJ SOSY: 100.0000 mg | PREFILLED_SYRINGE | Freq: Once | INTRAMUSCULAR | Status: DC

## 2022-06-24 MED ORDER — APIXABAN 5 MG PO TABS: 10.0000 mg | ORAL_TABLET | Freq: Two times a day (BID) | ORAL | Status: DC

## 2022-06-24 MED ORDER — APIXABAN 5 MG PO TABS: 5.0000 mg | ORAL_TABLET | Freq: Two times a day (BID) | ORAL | Status: DC

## 2022-06-24 MED ORDER — METOPROLOL TARTRATE 25 MG PO TABS
12.5000 mg | ORAL_TABLET | Freq: Two times a day (BID) | ORAL | 0 refills | Status: DC
  Filled 2022-06-24: qty 30, 30d supply, fill #0

## 2022-06-24 NOTE — Progress Notes (Signed)
ANTICOAGULATION CONSULT NOTE   Pharmacy Consult for apixaban Indication: pulmonary embolus  No Known Allergies  Patient Measurements: Height: 6\' 2"  (188 cm) Weight: 102.1 kg (225 lb 1.4 oz) IBW/kg (Calculated) : 82.2  Vital Signs: Temp: 99.2 F (37.3 C) (12/20 0500) Temp Source: Oral (12/20 0500) BP: 111/95 (12/20 0500) Pulse Rate: 93 (12/20 0500)  Labs: Recent Labs    06/22/22 0232 06/23/22 0149  HGB 12.1* 11.9*  HCT 34.1* 33.4*  PLT 178 176  CREATININE 0.75 0.82    Estimated Creatinine Clearance: 163.5 mL/min (by C-G formula based on SCr of 0.82 mg/dL).   Medical History: Past Medical History:  Diagnosis Date   ETOH abuse    Hypertension     Assessment: 4 yoM admitted with alcohol withdrawal and aspiration PNA, now c/b acute PE. Therapeutic enoxaparin given overnight, now to transition to apixaban.   Plan:  Stop enoxaparin Apixaban 10mg  BID starting tonight through 12/26 then 5mg  BID   , PharmD, BCPS, [facility] Clinical Pharmacist [phone] Please check AMION for all [facility] Pharmacy numbers 06/24/2022

## 2022-06-24 NOTE — Discharge Instructions (Signed)
Information on my medicine - ELIQUIS (apixaban)  This medication education was reviewed with me or my healthcare representative as part of my discharge preparation.  The pharmacist that spoke with me during my hospital stay was:  Mosetta Anis, [facility]  Why was Eliquis prescribed for you? Eliquis was prescribed to treat blood clots that may have been found in the veins of your legs (deep vein thrombosis) or in your lungs (pulmonary embolism) and to reduce the risk of them occurring again.  What do You need to know about Eliquis ? The starting dose is 10 mg (two 5 mg tablets) taken TWICE daily for the FIRST SEVEN (7) DAYS, then on (enter date)  07/01/22  the dose is reduced to ONE 5 mg tablet taken TWICE daily.  Eliquis may be taken with or without food.   Try to take the dose about the same time in the morning and in the evening. If you have difficulty swallowing the tablet whole please discuss with your pharmacist how to take the medication safely.  Take Eliquis exactly as prescribed and DO NOT stop taking Eliquis without talking to the doctor who prescribed the medication.  Stopping may increase your risk of developing a new blood clot.  Refill your prescription before you run out.  After discharge, you should have regular check-up appointments with your healthcare provider that is prescribing your Eliquis.    What do you do if you miss a dose? If a dose of ELIQUIS is not taken at the scheduled time, take it as soon as possible on the same day and twice-daily administration should be resumed. The dose should not be doubled to make up for a missed dose.  Important Safety Information A possible side effect of Eliquis is bleeding. You should call your healthcare provider right away if you experience any of the following: Bleeding from an injury or your nose that does not stop. Unusual colored urine (red or dark brown) or unusual colored stools (red or black). Unusual bruising for  unknown reasons. A serious fall or if you hit your head (even if there is no bleeding).  Some medicines may interact with Eliquis and might increase your risk of bleeding or clotting while on Eliquis. To help avoid this, consult your healthcare provider or pharmacist prior to using any new prescription or non-prescription medications, including herbals, vitamins, non-steroidal anti-inflammatory drugs (NSAIDs) and supplements.  This website has more information on Eliquis (apixaban): http://www.eliquis.com/eliquis/home

## 2022-06-24 NOTE — Progress Notes (Signed)
Mobility Specialist - Progress Note   06/24/22 1444  Mobility  Activity Ambulated with assistance in hallway  Level of Assistance Standby assist, set-up cues, supervision of patient - no hands on  Assistive Device None  Distance Ambulated (ft) 180 ft  Activity Response Tolerated well  Mobility Referral No  $Mobility charge 1 Mobility   Pt received in bed and agreeable to session. No complaints throughout. Pt was left in room with all needs met.   Brett Molina  Mobility Specialist Please contact via Solicitor or Rehab office at [phone]

## 2022-06-24 NOTE — Discharge Summary (Signed)
Physician Discharge Summary   Patient: Brett Molina MRN: [id number] DOB: 30-Sep-1990  Admit date:     06/18/2022  Discharge date: 06/24/22  Discharge Physician: Tyrone Nine   PCP: Pcp, No   Recommendations at discharge:  Recheck CMP, BP, and reinforce avoidance of illicit substances.  Continue eliquis for PE diagnosed during hospitalization. Consider recheck CBC.  Discharge Diagnoses: Principal Problem:   Acute alcohol intoxication (HCC) Active Problems:   Acute respiratory failure with hypoxia and hypercapnia (HCC)   Aspiration pneumonitis (HCC)   HTN (hypertension)   Hypokalemia  Hospital Course: Mr. Breau was admitted to the hospital with the working diagnosis of alcohol intoxication, complicated with aspiration pneumonitis/ pneumonia,and hypothermia.   31 yo male with past medical history of alcohol dependence, and withdrawal seizures who was brought to the ED after being found down. Apparently was in the ED 24 hrs prior with withdrawal symptoms but left against medical advice. Patient was intubated for airway protection, his blood pressure was 117/49, HR 70 RR 16 and 02 saturation 97%, temp 79.5, lungs with rhonchi at the right base, heart with S1 and S2 present and rhythmic, abdomen with no distention and no lower extremity edema, neurologically not responsive.   VBG pH 7.08/ Pc02 47/ P02 102/ bicarbonate 15/ 95% 02 sat.  Na 146, K 3.4 Cl 108 bicarbonate 16, glucose 199 bun 9 cr 0,92 anion gap 22.  Lactic acid 7,4 and 6.5  Wbc 17.2 hgb 12.1 plt 271  Sars covid 19 negative  Urine analysis SG 1,013, 100 protein, 20 ketones, pH 5.0, negative leukocytes, rbs 0-5, hgb dipstick moderate.  Alcohol level >600   CT head, cervical spine and maxillofacial with no acute changes.   Chest radiograph with no cardiomegaly, positive faint interstitial infiltrate at the right lower lobe. Positive ET tube above the carina and positive NG tube with tip below the diaphragm.   EKG 68  bpm, normal axis, right bundle branch block, qtc 582, sinus rhythm with no significant ST segment or T wave changes.   Patient was admitted to the ICU, placed on alcohol withdrawal protocol, thiamine, IV bicarbonate and IV antibiotic therapy.  IV keppra for suspected seizures and sedation with phenobarbital.   Required vasopressors for sedation induced hypotension.   12/15 Increased sedation for Vent Synch - Following commands, weaning pressors. EEG was negative. Extubated.  EEG negative for acute seizures.   12/17 transferred to [facility].   Remained hemodynamically stable without hypoxia or respiratory distress, though had refractory sinus tachycardia and elevated d-dimer. CTA chest performed, showing small PE for which anticoagulation was started without bleeding complications. Requests discharge 12/20.   Assessment and Plan: Acute alcohol intoxication (HCC) Hx Etoh Withdrawal Seizures Hypothermia Found Down Patient with toxic and metabolic encephalopathy who was found hypothermic and unresponsive outside in cold Unclear events leading to this, ? Seizure with lip trauma EEG with no epileptiform abnormalities. Keppra has been d/c'd, completed phenobarbital taper with no further evidence of withdrawal at discharge.  - Will need outpatient support (alcoholic anonymous)  - Social work consultation.  - Cessation counseling provided.   Acute respiratory failure with hypoxia and hypercapnia: Respiratory failure due to CNS dysfunction. Patient required invasive mechanical ventilation. Mentation has improved and he was liberated from ventilator on 12/15. - Resolved.   Acute PE: Without evidence of RV strain. No hypoxemia.  - Started, and will continue eliquis at discharge. MATCH provided. PCP follow up in IM clinic arranged for care continuity and assistance with orange card,  etc. Recommend at least 3-6 months anticoagulation.   3 mm foreign body in LL lip: I don't appreciate this on exam Will  discuss with rads -> recommended following symptomatically and will follow facial x ray   Aspiration pneumonitis  Leukocytosis improving, completed 7 days of antibiotics.  Suspect positive blood culture (1 of 4) with coag negative staph from 12/14 was a contaminant   HTN (hypertension) Amlodipine 10 mg Lisinopril 20 mg metoprolol 12.5mg  BID   Hypokalemia, hypophosphatemia, hypomagnesemia, hypernatremia. Resolved.  Consultants: PCCM Procedures performed: Intubation  Disposition: Home Diet recommendation:  Cardiac diet DISCHARGE MEDICATION: Allergies as of 06/24/2022   No Known Allergies      Medication List     STOP taking these medications    chlordiazePOXIDE 25 MG capsule Commonly known as: LIBRIUM       TAKE these medications    acetaminophen 500 MG tablet Commonly known as: TYLENOL Take 1,000 mg by mouth every 6 (six) hours as needed for mild pain.   amLODipine 10 MG tablet Commonly known as: NORVASC Take 1 tablet (10 mg total) by mouth daily.   Apixaban Starter Pack (10mg  and 5mg ) Commonly known as: ELIQUIS STARTER PACK Take as directed on package: start with two-5mg  tablets twice daily for 7 days. On day 8, switch to one-5mg  tablet twice daily.   folic acid 1 MG tablet Commonly known as: FOLVITE Take 1 tablet (1 mg total) by mouth daily.   levETIRAcetam 500 MG tablet Commonly known as: KEPPRA Take 1 tablet (500 mg total) by mouth 2 (two) times daily.   lisinopril 20 MG tablet Commonly known as: ZESTRIL Take 1 tablet (20 mg total) by mouth daily. Start taking on: June 25, 2022   metoprolol tartrate 25 MG tablet Commonly known as: LOPRESSOR Take 0.5 tablets (12.5 mg total) by mouth 2 (two) times daily.   ondansetron 4 MG tablet Commonly known as: ZOFRAN Take 1 tablet (4 mg total) by mouth every 8 (eight) hours as needed for nausea or vomiting.        Follow-up Information     , MD Follow up on 07/08/2022.   Contact  information: [address] [place] [address] [place] [phone]                Discharge Exam: Filed Weights   06/22/22 0521 06/23/22 0504 06/24/22 0500  Weight: 100.5 kg 100.2 kg 102.1 kg  BP (!) 126/97 (BP Location: Right Arm)   Pulse (!) 112   Temp 97.7 F (36.5 C) (Oral)   Resp 18   Ht 6\' 2"  (1.88 m)   Wt 102.1 kg   SpO2 97%   BMI 28.90 kg/m   Well-appearing male standing in plain clothes pacing in room requesting discharge. Does not want to wait for LE U/S Clear, nonlabored Regular tachycardia, no MRG. Alert, oriented, nonfocal.  Condition at discharge: stable  The results of significant diagnostics from this hospitalization (including imaging, microbiology, ancillary and laboratory) are listed below for reference.   Imaging Studies: CT Angio Chest Pulmonary Embolism (PE) W or WO Contrast  Addendum Date: 06/24/2022   ADDENDUM REPORT: 06/24/2022 00:42 ADDENDUM: Critical Value/emergent results were called by telephone at the time of interpretation on 06/24/2022 at 12:32 am to provider Dr 06/26/2022, who verbally acknowledged these results. Electronically Signed   By: 06/26/2022 M.D.   On: 06/24/2022 00:42   Result Date: 06/24/2022 CLINICAL DATA:  Elevated D-dimer, exaggerated sinus tachycardia EXAM: CT ANGIOGRAPHY CHEST WITH CONTRAST TECHNIQUE: Multidetector  CT imaging of the chest was performed using the standard protocol during bolus administration of intravenous contrast. Multiplanar CT image reconstructions and MIPs were obtained to evaluate the vascular anatomy. RADIATION DOSE REDUCTION: This exam was performed according to the departmental dose-optimization program which includes automated exposure control, adjustment of the mA and/or kV according to patient size and/or use of iterative reconstruction technique. CONTRAST:  60mL OMNIPAQUE IOHEXOL 350 MG/ML SOLN COMPARISON:  Chest radiograph dated 06/19/2022 FINDINGS: Cardiovascular: Satisfactory opacification the  pulmonary arteries to the segmental level. Isolated subsegmental left lower lobe pulmonary embolism (series 7/image 307). Overall clot burden is very small. Although not tailored for evaluation of the thoracic aorta, there is no evidence of thoracic aortic aneurysm or dissection. The heart is normal in size.  No pericardial effusion. Mediastinum/Nodes: No suspicious mediastinal lymphadenopathy. Visualized thyroid is unremarkable. Lungs/Pleura: Mild bibasilar atelectasis. Faint ground-glass nodularity in the right upper lobe (for example, series 6/image 34), suggesting very mild infection/pneumonia. No focal consolidation. No pleural effusion or pneumothorax. Upper Abdomen: Visualized upper abdomen is notable for excretory contrast in the bilateral renal collecting systems. Musculoskeletal: Visualized osseous structures are within normal limits. Review of the MIP images confirms the above findings. IMPRESSION: Isolated subsegmental left lower lobe pulmonary embolism. Overall clot burden is very small. Faint ground-glass nodularity in the right upper lobe, suggesting very mild infection/pneumonia. Electronically Signed: By: Charline Bills M.D. On: 06/24/2022 00:23   DG Facial Bones Complete  Result Date: 06/23/2022 CLINICAL DATA:  Evaluate for foreign body in the lip. Chipped tooth in lip. EXAM: FACIAL BONES COMPLETE 3+V COMPARISON:  CT maxillofacial 06/17/2022 FINDINGS: On the lateral view only there is a punctate 1 mm noncalcified density in the soft tissues of the lower lip. When compared to prior CT, this has decreased in size, but is in a similar location. No other radiopaque foreign bodies identified in the soft tissues. Metallic dentition present. No fracture or dislocation. IMPRESSION: On the lateral view only there is a punctate 1 mm noncalcified density in the soft tissues of the lower lip. When compared to prior CT, this has decreased in size, but is in a similar location. Electronically Signed    By: Darliss Cheney M.D.   On: 06/23/2022 21:31   ECHOCARDIOGRAM COMPLETE  Result Date: 06/19/2022    ECHOCARDIOGRAM REPORT   Patient Name:   PRESTYN STANCO [facility] Date of Exam: 06/19/2022 Medical Rec #:  [id number]           Height:       74.0 in Accession #:    [id number]          Weight:       231.5 lb Date of Birth:  [birth year]           BSA:          2.313 m Patient Age:    31 years            BP:           101/59 mmHg Patient Gender: M                   HR:           110 bpm. Exam Location:  Inpatient Procedure: 2D Echo, Cardiac Doppler and Color Doppler Indications:    Cardiomyopathy-Unspecified I42.9  History:        Patient has prior history of Echocardiogram examinations, most  recent 11/20/2018. Risk Factors:Hypertension. Hypothermia.  Sonographer:    Leta Jungling RDCS Referring Phys: [id number] Lorin Glass  Sonographer Comments: Echo performed with patient sitting upright and intubated. IMPRESSIONS  1. Left ventricular ejection fraction, by estimation, is 60 to 65%. The left ventricle has normal function. The left ventricle has no regional wall motion abnormalities. There is mild left ventricular hypertrophy. Indeterminate diastolic filling due to E-A fusion.  2. Right ventricular systolic function was not well visualized. The right ventricular size is not well visualized.  3. The mitral valve is normal in structure. Trivial mitral valve regurgitation. No evidence of mitral stenosis.  4. The aortic valve was not well visualized. Aortic valve regurgitation is not visualized. No aortic stenosis is present. FINDINGS  Left Ventricle: Left ventricular ejection fraction, by estimation, is 60 to 65%. The left ventricle has normal function. The left ventricle has no regional wall motion abnormalities. The left ventricular internal cavity size was small. There is mild left ventricular hypertrophy. Indeterminate diastolic filling due to E-A fusion. Right Ventricle: The right ventricular size is not  well visualized. Right vetricular wall thickness was not well visualized. Right ventricular systolic function was not well visualized. Left Atrium: Left atrial size was normal in size. Right Atrium: Right atrial size was not well visualized. Pericardium: There is no evidence of pericardial effusion. Mitral Valve: The mitral valve is normal in structure. Trivial mitral valve regurgitation. No evidence of mitral valve stenosis. Tricuspid Valve: The tricuspid valve is normal in structure. Tricuspid valve regurgitation is trivial. No evidence of tricuspid stenosis. Aortic Valve: The aortic valve was not well visualized. Aortic valve regurgitation is not visualized. No aortic stenosis is present. Pulmonic Valve: The pulmonic valve was normal in structure. Pulmonic valve regurgitation is trivial. No evidence of pulmonic stenosis. Aorta: The aortic root is normal in size and structure. Venous: IVC assessment for right atrial pressure unable to be performed due to mechanical ventilation. IAS/Shunts: The interatrial septum was not well visualized.  LEFT VENTRICLE PLAX 2D LVIDd:         3.60 cm LVIDs:         2.40 cm LV PW:         1.10 cm LV IVS:        1.10 cm LVOT diam:     2.10 cm LV SV:         53 LV SV Index:   23 LVOT Area:     3.46 cm  RIGHT VENTRICLE TAPSE (M-mode): 2.2 cm LEFT ATRIUM           Index LA diam:      3.20 cm 1.38 cm/m LA Vol (A2C): 21.7 ml 9.38 ml/m LA Vol (A4C): 20.6 ml 8.91 ml/m  AORTIC VALVE LVOT Vmax:   95.00 cm/s LVOT Vmean:  55.500 cm/s LVOT VTI:    0.152 m  AORTA Ao Root diam: 3.10 cm  SHUNTS Systemic VTI:  0.15 m Systemic Diam: 2.10 cm Epifanio Lesches MD Electronically signed by Epifanio Lesches MD Signature Date/Time: 06/19/2022/10:00:17 AM    Final    DG Chest Port 1 View  Result Date: 06/19/2022 CLINICAL DATA:  Found unresponsive.  Hypothermia on ventilator EXAM: PORTABLE CHEST 1 VIEW COMPARISON:  Yesterday FINDINGS: Endotracheal tube with tip just below the clavicular  heads. The enteric tube reaches the stomach at least. Left IJ line with tip at the upper cavoatrial junction. Reinflated right lower lobe. Symmetric clear lungs. Normal heart size. IMPRESSION: 1. Reinflated right lower lobe.  Symmetric  normal aeration. 2. Unremarkable hardware. Electronically Signed   By: JonathTiburcio PeaD.   On: 06/19/2022 06:28   DG CHEST PORT 1 VIEW  Result Date: 06/18/2022 CLINICAL DATA:  Encounter for central line placement. Orogastric tube adjustment. EXAM: PORTABLE CHEST 1 VIEW COMPARISON:  AP chest 06/18/2022 at 1140 hours, 10/02/2021 FINDINGS: AP chest 06/18/2022 at 3:07 p.m. Endotracheal tube tip terminates approximately 5-6 cm above the carina, at the inferior aspect of the clavicular heads. Enteric tube side port and tip overlying the left upper abdominal quadrant, similar to prior. New left internal jugular central venous catheter tip extends at least to the superior vena cava/right atrial junction and may be minimally within the superior right atrium by up to approximately 1.5 cm. Cardiac silhouette and mediastinal contours are within normal limits. There is again mild elevation the right hemidiaphragm, unchanged from radiograph earlier today but new from 10/02/2021. Right basilar heterogeneous airspace opacities similar to prior. The left lung is clear. No pleural effusion or pneumothorax. No acute skeletal abnormality. IMPRESSION: 1. New left internal jugular central venous catheter tip extends at least to the superior vena cava/right atrial junction and may be minimally within the superior right atrium by up to approximately 1.5 cm. 2. Endotracheal tube and enteric tube are in appropriate position. 3. Unchanged right lower lung volume loss and airspace opacity. Electronically Signed   By: Neita Garnet M.D.   On: 06/18/2022 15:17   EEG adult  Result Date: 06/18/2022 Jefferson Fuel, MD     06/18/2022  2:48 PM Routine EEG Report Yancarlos Berthold Greenslade is a 31 y.o. male with  a history of seizure who is undergoing an EEG to evaluate for seizures. Report: This EEG was acquired with electrodes placed according to the International 10-20 electrode system (including Fp1, Fp2, F3, F4, C3, C4, P3, P4, O1, O2, T3, T4, T5, T6, A1, A2, Fz, Cz, Pz). The following electrodes were missing or displaced: none. The best background was approx 4 Hz. This activity is reactive to stimulation. Sleep architecture was not identified. There was no focal slowing. There were no interictal epileptiform discharges. There were no electrographic seizures identified. Photic stimulation and hyperventilation were not performed. Impression and clinical correlation: This EEG was obtained while comatose and is abnormal due to severe diffuse slowing indicative of global cerebral dysfunction. Epileptiform abnormalities were not seen during this recording. Bing Neighbors, MD [facility] [phone] If 7pm- 7am, please page neurology on call as listed in AMION.   CT Head Wo Contrast  Result Date: 06/18/2022 CLINICAL DATA:  Provided history: Mental status change, unknown cause. Unresponsive. EXAM: CT HEAD WITHOUT CONTRAST TECHNIQUE: Contiguous axial images were obtained from the base of the skull through the vertex without intravenous contrast. RADIATION DOSE REDUCTION: This exam was performed according to the departmental dose-optimization program which includes automated exposure control, adjustment of the mA and/or kV according to patient size and/or use of iterative reconstruction technique. COMPARISON:  Prior head CT examinations 06/17/2022 and earlier. FINDINGS: Brain: There is no acute intracranial hemorrhage. No demarcated cortical infarct. No extra-axial fluid collection. No evidence of an intracranial mass. No midline shift. Vascular: No hyperdense vessel. Skull: No fracture or aggressive osseous lesion. Sinuses/Orbits: No mass or acute finding within the imaged orbits. Small-volume frothy  secretions, and mild mucosal thickening, within the left frontal sinus. Scattered mucosal thickening within the bilateral ethmoid air cells, overall mild-to-moderate in severity. Mild mucosal thickening within the bilateral sphenoid sinuses. Trace secretions also present within the left sphenoid  sinus. IMPRESSION: No evidence of acute intracranial abnormality. Paranasal sinus disease, as described. Electronically Signed   By: Jackey Loge D.O.   On: 06/18/2022 12:31   DG Chest Portable 1 View  Result Date: 06/18/2022 CLINICAL DATA:  Intubation. EXAM: PORTABLE CHEST 1 VIEW COMPARISON:  Chest x-ray dated October 02, 2021. FINDINGS: Endotracheal tube tip in good position 4.6 cm above the carina. Enteric tube appropriately positioned within the stomach. The heart size and mediastinal contours are within normal limits. Normal pulmonary vascularity. Volume loss in the right lung with mild right basilar opacity, favored to represent atelectasis. No pleural effusion or pneumothorax. IMPRESSION: 1. Appropriately positioned endotracheal and enteric tubes. 2. Mild right basilar atelectasis. Electronically Signed   By: Obie Dredge M.D.   On: 06/18/2022 12:05   CT HEAD WO CONTRAST ( )  Result Date: 06/17/2022 CLINICAL DATA:  Intoxication with fall and facial injury EXAM: CT HEAD WITHOUT CONTRAST CT MAXILLOFACIAL WITHOUT CONTRAST CT CERVICAL SPINE WITHOUT CONTRAST TECHNIQUE: Multidetector CT imaging of the head, cervical spine, and maxillofacial structures were performed using the standard protocol without intravenous contrast. Multiplanar CT image reconstructions of the cervical spine and maxillofacial structures were also generated. RADIATION DOSE REDUCTION: This exam was performed according to the departmental dose-optimization program which includes automated exposure control, adjustment of the mA and/or kV according to patient size and/or use of iterative reconstruction technique. COMPARISON:  01/25/2021 head  CT FINDINGS: CT HEAD FINDINGS Brain: No evidence of acute infarction, hemorrhage, hydrocephalus, extra-axial collection or mass lesion/mass effect. Vascular: No hyperdense vessel or unexpected calcification. Skull: Normal. Negative for fracture or focal lesion. CT MAXILLOFACIAL FINDINGS Osseous: Negative for fracture or mandibular dislocation Orbits: Negative Sinuses: Negative for hemosinus Soft tissues: 3 mm foreign body in the left lower lip CT CERVICAL SPINE FINDINGS Alignment: Normal Skull base and vertebrae: No acute fracture. No primary bone lesion or focal pathologic process. Soft tissues and spinal canal: No prevertebral fluid or swelling. No visible canal hematoma. Disc levels: No significant degenerative change or visible impingement Upper chest: Negative IMPRESSION: 1. 3 mm foreign body in the left lower lip. Negative for facial fracture. 2. No evidence of intracranial or cervical spine injury. Electronically Signed   By: Tiburcio Pea M.D.   On: 06/17/2022 06:32   CT Maxillofacial Wo Contrast  Result Date: 06/17/2022 CLINICAL DATA:  Intoxication with fall and facial injury EXAM: CT HEAD WITHOUT CONTRAST CT MAXILLOFACIAL WITHOUT CONTRAST CT CERVICAL SPINE WITHOUT CONTRAST TECHNIQUE: Multidetector CT imaging of the head, cervical spine, and maxillofacial structures were performed using the standard protocol without intravenous contrast. Multiplanar CT image reconstructions of the cervical spine and maxillofacial structures were also generated. RADIATION DOSE REDUCTION: This exam was performed according to the departmental dose-optimization program which includes automated exposure control, adjustment of the mA and/or kV according to patient size and/or use of iterative reconstruction technique. COMPARISON:  01/25/2021 head CT FINDINGS: CT HEAD FINDINGS Brain: No evidence of acute infarction, hemorrhage, hydrocephalus, extra-axial collection or mass lesion/mass effect. Vascular: No hyperdense vessel  or unexpected calcification. Skull: Normal. Negative for fracture or focal lesion. CT MAXILLOFACIAL FINDINGS Osseous: Negative for fracture or mandibular dislocation Orbits: Negative Sinuses: Negative for hemosinus Soft tissues: 3 mm foreign body in the left lower lip CT CERVICAL SPINE FINDINGS Alignment: Normal Skull base and vertebrae: No acute fracture. No primary bone lesion or focal pathologic process. Soft tissues and spinal canal: No prevertebral fluid or swelling. No visible canal hematoma. Disc levels: No significant degenerative change or visible impingement  Upper chest: Negative IMPRESSION: 1. 3 mm foreign body in the left lower lip. Negative for facial fracture. 2. No evidence of intracranial or cervical spine injury. Electronically Signed   By: Tiburcio Pea M.D.   On: 06/17/2022 06:32   CT Cervical Spine Wo Contrast  Result Date: 06/17/2022 CLINICAL DATA:  Intoxication with fall and facial injury EXAM: CT HEAD WITHOUT CONTRAST CT MAXILLOFACIAL WITHOUT CONTRAST CT CERVICAL SPINE WITHOUT CONTRAST TECHNIQUE: Multidetector CT imaging of the head, cervical spine, and maxillofacial structures were performed using the standard protocol without intravenous contrast. Multiplanar CT image reconstructions of the cervical spine and maxillofacial structures were also generated. RADIATION DOSE REDUCTION: This exam was performed according to the departmental dose-optimization program which includes automated exposure control, adjustment of the mA and/or kV according to patient size and/or use of iterative reconstruction technique. COMPARISON:  01/25/2021 head CT FINDINGS: CT HEAD FINDINGS Brain: No evidence of acute infarction, hemorrhage, hydrocephalus, extra-axial collection or mass lesion/mass effect. Vascular: No hyperdense vessel or unexpected calcification. Skull: Normal. Negative for fracture or focal lesion. CT MAXILLOFACIAL FINDINGS Osseous: Negative for fracture or mandibular dislocation Orbits:  Negative Sinuses: Negative for hemosinus Soft tissues: 3 mm foreign body in the left lower lip CT CERVICAL SPINE FINDINGS Alignment: Normal Skull base and vertebrae: No acute fracture. No primary bone lesion or focal pathologic process. Soft tissues and spinal canal: No prevertebral fluid or swelling. No visible canal hematoma. Disc levels: No significant degenerative change or visible impingement Upper chest: Negative IMPRESSION: 1. 3 mm foreign body in the left lower lip. Negative for facial fracture. 2. No evidence of intracranial or cervical spine injury. Electronically Signed   By: Tiburcio Pea M.D.   On: 06/17/2022 06:32    Microbiology: Results for orders placed or performed during the hospital encounter of 06/18/22  Blood culture (routine x 2)     Status: Abnormal   Collection Time: 06/18/22 11:38 AM   Specimen: BLOOD LEFT HAND  Result Value Ref Range Status   Specimen Description BLOOD LEFT HAND  Final   Special Requests   Final    BOTTLES DRAWN AEROBIC AND ANAEROBIC Blood Culture adequate volume   Culture  Setup Time   Final    GRAM POSITIVE COCCI IN CLUSTERS AEROBIC BOTTLE ONLY CRITICAL RESULT CALLED TO, READ BACK BY AND VERIFIED WITH: PHARMD LEDFORD 06/19/22 @ 0717 BY AB    Culture (A)  Final    STAPHYLOCOCCUS HOMINIS THE SIGNIFICANCE OF ISOLATING THIS ORGANISM FROM A SINGLE SET OF BLOOD CULTURES WHEN MULTIPLE SETS ARE DRAWN IS UNCERTAIN. PLEASE NOTIFY THE MICROBIOLOGY DEPARTMENT WITHIN ONE WEEK IF SPECIATION AND SENSITIVITIES ARE REQUIRED. Performed at [facility], [address] [place], [place] [address]    Report Status 06/21/2022 FINAL  Final  Blood Culture ID Panel (Reflexed)     Status: Abnormal   Collection Time: 06/18/22 11:38 AM  Result Value Ref Range Status   Enterococcus faecalis NOT DETECTED NOT DETECTED Final   Enterococcus Faecium NOT DETECTED NOT DETECTED Final   Listeria monocytogenes NOT DETECTED NOT DETECTED Final   Staphylococcus species  DETECTED (A) NOT DETECTED Final    Comment: CRITICAL RESULT CALLED TO, READ BACK BY AND VERIFIED WITH: PHARMD LEDFORD 06/19/22 @ 0717 BY AB    Staphylococcus aureus (BCID) NOT DETECTED NOT DETECTED Final   Staphylococcus epidermidis NOT DETECTED NOT DETECTED Final   Staphylococcus lugdunensis NOT DETECTED NOT DETECTED Final   Streptococcus species NOT DETECTED NOT DETECTED Final   Streptococcus agalactiae  NOT DETECTED NOT DETECTED Final   Streptococcus pneumoniae NOT DETECTED NOT DETECTED Final   Streptococcus pyogenes NOT DETECTED NOT DETECTED Final   A.calcoaceticus-baumannii NOT DETECTED NOT DETECTED Final   Bacteroides fragilis NOT DETECTED NOT DETECTED Final   Enterobacterales NOT DETECTED NOT DETECTED Final   Enterobacter cloacae complex NOT DETECTED NOT DETECTED Final   Escherichia coli NOT DETECTED NOT DETECTED Final   Klebsiella aerogenes NOT DETECTED NOT DETECTED Final   Klebsiella oxytoca NOT DETECTED NOT DETECTED Final   Klebsiella pneumoniae NOT DETECTED NOT DETECTED Final   Proteus species NOT DETECTED NOT DETECTED Final   Salmonella species NOT DETECTED NOT DETECTED Final   Serratia marcescens NOT DETECTED NOT DETECTED Final   Haemophilus influenzae NOT DETECTED NOT DETECTED Final   Neisseria meningitidis NOT DETECTED NOT DETECTED Final   Pseudomonas aeruginosa NOT DETECTED NOT DETECTED Final   Stenotrophomonas maltophilia NOT DETECTED NOT DETECTED Final   Candida albicans NOT DETECTED NOT DETECTED Final   Candida auris NOT DETECTED NOT DETECTED Final   Candida glabrata NOT DETECTED NOT DETECTED Final   Candida krusei NOT DETECTED NOT DETECTED Final   Candida parapsilosis NOT DETECTED NOT DETECTED Final   Candida tropicalis NOT DETECTED NOT DETECTED Final   Cryptococcus neoformans/gattii NOT DETECTED NOT DETECTED Final    Comment: Performed at [facility], [address], [place], [place] [address]  Blood culture (routine x 2)     Status: None   Collection  Time: 06/18/22 11:42 AM   Specimen: BLOOD  Result Value Ref Range Status   Specimen Description BLOOD RIGHT ANTECUBITAL  Final   Special Requests   Final    BOTTLES DRAWN AEROBIC AND ANAEROBIC Blood Culture adequate volume   Culture   Final    NO GROWTH 5 DAYS Performed at [facility], [address] [place], [place] [address]    Report Status 06/23/2022 FINAL  Final  Urine Culture     Status: None   Collection Time: 06/18/22 11:51 AM   Specimen: Urine, Clean Catch  Result Value Ref Range Status   Specimen Description URINE, CLEAN CATCH  Final   Special Requests NONE  Final   Culture   Final    NO GROWTH Performed at [facility], [address] [place], [place] [address]    Report Status 06/19/2022 FINAL  Final  MRSA Next Gen by PCR, Nasal     Status: None   Collection Time: 06/18/22  1:12 PM   Specimen: Nasal Mucosa; Nasal Swab  Result Value Ref Range Status   MRSA by PCR Next Gen NOT DETECTED NOT DETECTED Final    Comment: (NOTE) The GeneXpert MRSA Assay (FDA approved for NASAL specimens only), is one component of a comprehensive MRSA colonization surveillance program. It is not intended to diagnose MRSA infection nor to guide or monitor treatment for MRSA infections. Test performance is not FDA approved in patients less than 8 years old. Performed at [facility], [address] [place], [place] [address]   Resp panel by RT-PCR (RSV, Flu A&B, Covid) Anterior Nasal Swab     Status: None   Collection Time: 06/19/22  5:40 AM   Specimen: Anterior Nasal Swab  Result Value Ref Range Status   SARS Coronavirus 2 by RT PCR NEGATIVE NEGATIVE Final    Comment: (NOTE) SARS-CoV-2 target nucleic acids are NOT DETECTED.  The SARS-CoV-2 RNA is generally detectable in upper respiratory specimens during the acute phase of infection.  The lowest concentration of SARS-CoV-2 viral copies this assay can detect is 138 copies/mL. A negative result does not preclude  SARS-Cov-2 infection and should not be used as the sole basis for treatment or other patient management decisions. A negative result may occur with  improper specimen collection/handling, submission of specimen other than nasopharyngeal swab, presence of viral mutation(s) within the areas targeted by this assay, and inadequate number of viral copies(<138 copies/mL). A negative result must be combined with clinical observations, patient history, and epidemiological information. The expected result is Negative.  Fact Sheet for Patients:  BloggerCourse.com  Fact Sheet for Healthcare Providers:  SeriousBroker.it  This test is no t yet approved or cleared by the [place] FDA and  has been authorized for detection and/or diagnosis of SARS-CoV-2 by FDA under an Emergency Use Authorization (EUA). This EUA will remain  in effect (meaning this test can be used) for the duration of the COVID-19 declaration under Section 564(b)(1) of the Act, 21 U.S.C.section 360bbb-3(b)(1), unless the authorization is terminated  or revoked sooner.       Influenza A by PCR NEGATIVE NEGATIVE Final   Influenza B by PCR NEGATIVE NEGATIVE Final    Comment: (NOTE) The Xpert Xpress SARS-CoV-2/FLU/RSV plus assay is intended as an aid in the diagnosis of influenza from Nasopharyngeal swab specimens and should not be used as a sole basis for treatment. Nasal washings and aspirates are unacceptable for Xpert Xpress SARS-CoV-2/FLU/RSV testing.  Fact Sheet for Patients: BloggerCourse.com  Fact Sheet for Healthcare Providers: SeriousBroker.it  This test is not yet approved or cleared by the [place] FDA and has been authorized for detection and/or diagnosis of SARS-CoV-2 by FDA under an Emergency Use Authorization (EUA). This EUA will remain in effect (meaning this test can be used) for the duration of  the COVID-19 declaration under Section 564(b)(1) of the Act, 21 U.S.C. section 360bbb-3(b)(1), unless the authorization is terminated or revoked.     Resp Syncytial Virus by PCR NEGATIVE NEGATIVE Final    Comment: (NOTE) Fact Sheet for Patients: BloggerCourse.com  Fact Sheet for Healthcare Providers: SeriousBroker.it  This test is not yet approved or cleared by the [place] FDA and has been authorized for detection and/or diagnosis of SARS-CoV-2 by FDA under an Emergency Use Authorization (EUA). This EUA will remain in effect (meaning this test can be used) for the duration of the COVID-19 declaration under Section 564(b)(1) of the Act, 21 U.S.C. section 360bbb-3(b)(1), unless the authorization is terminated or revoked.  Performed at [facility], [address] [place], [place] [address]     Labs: CBC: Recent Labs  Lab 06/18/22 1110 06/18/22 1117 06/19/22 0406 06/20/22 0412 06/21/22 0601 06/22/22 0232 06/23/22 0149  WBC 13.3*  --  17.2* 11.8* 15.7* 13.4* 11.3*  NEUTROABS 9.6*  --   --   --   --   --  8.0*  HGB 15.8   < > 12.1* 10.8* 12.3* 12.1* 11.9*  HCT 48.5   < > 35.4* 31.3* 34.7* 34.1* 33.4*  MCV 103.6*  --  95.7 97.5 96.1 96.9 96.3  PLT 331  --  271 162 150 178 176   < > = values in this interval not displayed.   Basic Metabolic Panel: Recent Labs  Lab 06/19/22 0406 06/19/22 1250 06/20/22 0412 06/20/22 2026 06/21/22 0601 06/22/22 0232 06/23/22 0149  NA 143 145 142  --  137 135 136  K 2.9* 3.3* 3.7  --  3.4* 3.6 3.7  CL  106 106 107  --  103 106 105  CO2 19* 26 28  --  24 21* 21*  GLUCOSE 225* 115* 87  --  101* 120* 99  BUN 6 <5* <5*  --  <5* 7 8  CREATININE 1.02 1.09 0.69  --  0.71 0.75 0.82  CALCIUM 7.5* 7.6* 8.3*  --  8.6* 8.6* 9.0  MG 1.5*  --  1.9  --  2.0  --  2.0  PHOS 1.9*  --  1.6* 2.4* 2.8  --  3.0   Liver Function Tests: Recent Labs  Lab 06/19/22 0406 06/19/22 1250  06/20/22 0412 06/21/22 0601 06/23/22 0149  AST 54* 67* 77* 75* 102*  ALT 34 37 34 41 69*  ALKPHOS 46 50 45 61 60  BILITOT 0.5 0.3 1.0 1.9* 0.6  PROT 5.5* 5.9* 5.8* 6.2* 6.5  ALBUMIN 2.9* 3.2* 3.0* 3.0* 3.0*   CBG: Recent Labs  Lab 06/20/22 2011 06/20/22 2323 06/21/22 0316 06/21/22 0730 06/21/22 1201  GLUCAP 112* 105* 102* 92 93    Discharge time spent: greater than 30 minutes.  Signed: Tyrone Nine, MD [facility] 06/24/2022

## 2022-06-24 NOTE — Plan of Care (Signed)

## 2022-06-24 NOTE — Care Management (Signed)
06-24-22 1015 Transition of Care Department [facility]) has reviewed the patient. Patient is currently without Insurance or a PCP at this time- Patient is Medicaid Pending at this time. Case Manager arranged PCP appointment with the Internal Medicine Clinic-the office can assist with the [facility] Card for medication assistance outpatient. Information will be placed on the AVS for the appointment. Case Manager will follow for [facility] assistance for medication needs as the patient progresses. All new medications will need to go to [facility].

## 2022-07-04 ENCOUNTER — Emergency Department (HOSPITAL_COMMUNITY)
Admission: EM | Admit: 2022-07-04 | Discharge: 2022-07-04 | Disposition: A | Discharge status: Home / Self Care | Payer: Medicaid Other | Attending: Emergency Medicine | Admitting: Emergency Medicine

## 2022-07-04 ENCOUNTER — Encounter (HOSPITAL_COMMUNITY): Payer: Self-pay

## 2022-07-04 DIAGNOSIS — F10120 Alcohol abuse with intoxication, uncomplicated: Secondary | ICD-10-CM | POA: Diagnosis present

## 2022-07-04 DIAGNOSIS — F10920 Alcohol use, unspecified with intoxication, uncomplicated: Secondary | ICD-10-CM

## 2022-07-04 DIAGNOSIS — Z7901 Long term (current) use of anticoagulants: Secondary | ICD-10-CM | POA: Insufficient documentation

## 2022-07-04 DIAGNOSIS — R112 Nausea with vomiting, unspecified: Secondary | ICD-10-CM | POA: Diagnosis not present

## 2022-07-04 DIAGNOSIS — Y908 Blood alcohol level of 240 mg/100 ml or more: Secondary | ICD-10-CM | POA: Diagnosis not present

## 2022-07-04 LAB — COMPREHENSIVE METABOLIC PANEL
ALT: 25 U/L (ref 0–44)
AST: 33 U/L (ref 15–41)
Albumin: 3 g/dL — ABNORMAL LOW (ref 3.5–5.0)
Alkaline Phosphatase: 103 U/L (ref 38–126)
Anion gap: 19 — ABNORMAL HIGH (ref 5–15)
BUN: <5 mg/dL — ABNORMAL LOW (ref 6–20)
CO2: 26 mmol/L (ref 22–32)
Calcium: 8.4 mg/dL — ABNORMAL LOW (ref 8.9–10.3)
Chloride: 89 mmol/L — ABNORMAL LOW (ref 98–111)
Creatinine, Ser: 0.7 mg/dL (ref 0.61–1.24)
GFR, Estimated: >60 mL/min (ref >60)
Glucose, Bld: 121 mg/dL — ABNORMAL HIGH (ref 70–99)
Potassium: 3.1 mmol/L — ABNORMAL LOW (ref 3.5–5.1)
Sodium: 134 mmol/L — ABNORMAL LOW (ref 135–145)
Total Bilirubin: 0.4 mg/dL (ref 0.3–1.2)
Total Protein: 7.7 g/dL (ref 6.5–8.1)

## 2022-07-04 LAB — ETHANOL: Alcohol, Ethyl (B): 249 mg/dL — ABNORMAL HIGH (ref <10)

## 2022-07-04 LAB — I-STAT CHEM 8, ED
BUN: <3 mg/dL — ABNORMAL LOW (ref 6–20)
Calcium, Ion: 0.88 mmol/L — CL (ref 1.15–1.40)
Chloride: 89 mmol/L — ABNORMAL LOW (ref 98–111)
Creatinine, Ser: 1.2 mg/dL (ref 0.61–1.24)
Glucose, Bld: 148 mg/dL — ABNORMAL HIGH (ref 70–99)
HCT: 42 % (ref 39.0–52.0)
Hemoglobin: 14.3 g/dL (ref 13.0–17.0)
Potassium: 3.8 mmol/L (ref 3.5–5.1)
Sodium: 131 mmol/L — ABNORMAL LOW (ref 135–145)
TCO2: 28 mmol/L (ref 22–32)

## 2022-07-04 LAB — LIPASE, BLOOD: Lipase: 35 U/L (ref 11–51)

## 2022-07-04 MED ORDER — LACTATED RINGERS IV BOLUS
1000.0000 mL | Freq: Once | INTRAVENOUS | Status: AC
  Administered 2022-07-04: 1000 mL via INTRAVENOUS

## 2022-07-04 MED ORDER — ONDANSETRON HCL 4 MG/2ML IJ SOLN
4.0000 mg | Freq: Once | INTRAMUSCULAR | Status: AC
  Administered 2022-07-04: 4 mg via INTRAVENOUS
  Filled 2022-07-04: qty 2

## 2022-07-04 MED ORDER — ONDANSETRON HCL 8 MG PO TABS: 8.0000 mg | ORAL_TABLET | Freq: Three times a day (TID) | ORAL | 0 refills | Status: DC | PRN

## 2022-07-04 MED ORDER — SODIUM CHLORIDE 0.9 % IV BOLUS
1000.0000 mL | Freq: Once | INTRAVENOUS | Status: AC
  Administered 2022-07-04: 1000 mL via INTRAVENOUS

## 2022-07-04 MED ORDER — LORAZEPAM 2 MG/ML IJ SOLN
0.5000 mg | Freq: Once | INTRAMUSCULAR | Status: AC
  Administered 2022-07-04: 0.5 mg via INTRAVENOUS
  Filled 2022-07-04: qty 1

## 2022-07-04 MED ORDER — STERILE WATER FOR INJECTION IJ SOLN
INTRAMUSCULAR | Status: AC
  Filled 2022-07-04: qty 10

## 2022-07-04 MED ORDER — PANTOPRAZOLE SODIUM 40 MG IV SOLR
40.0000 mg | Freq: Once | INTRAVENOUS | Status: AC
  Administered 2022-07-04: 40 mg via INTRAVENOUS
  Filled 2022-07-04: qty 10

## 2022-07-04 NOTE — ED Provider Notes (Signed)
[facility]-EMERGENCY DEPT Provider Note   CSN: [id number] Arrival date & time: 07/04/22  0700     History  Chief Complaint  Patient presents with   Alcohol Intoxication    Brett Molina is a 31 y.o. male.  HPI 31 year old male presents today complaining of nausea and vomiting.  He states he drank 2 bottles of wine last night and became nauseated and vomited multiple times.  He reports he has been drinking more heavily recently.  He states that he is in the midst of a break-up with his spouse.  He reports that during this time he has seen some blood in his emesis but has not noted any tonight.  He has not felt weak or lightheaded.  He denies any previous alcohol withdrawal.  He denies any other substance abuse.  He denies suicidal or homicidal ideations and is not actively experiencing any psychosis.     Home Medications Prior to Admission medications   Medication Sig Start Date End Date Taking? Authorizing Provider  acetaminophen (TYLENOL) 500 MG tablet Take 1,000 mg by mouth every 6 (six) hours as needed for mild pain.    [provider]  amLODipine (NORVASC) 10 MG tablet Take 1 tablet (10 mg total) by mouth daily. 10/08/21 11/07/21  Glade Lloyd, MD  APIXABAN Everlene Balls) VTE STARTER PACK (10MG  AND 5MG ) Take as directed on package: start with two-5mg  tablets twice daily for 7 days. On day 8, switch to one-5mg  tablet twice daily. 06/24/22   , MD  folic acid (FOLVITE) 1 MG tablet Take 1 tablet (1 mg total) by mouth daily. Patient not taking: Reported on 06/22/2022 10/09/21   06/24/2022, MD  levETIRAcetam (KEPPRA) 500 MG tablet Take 1 tablet (500 mg total) by mouth 2 (two) times daily. 10/08/21 11/07/21  12/08/21, MD  lisinopril (ZESTRIL) 20 MG tablet Take 1 tablet (20 mg total) by mouth daily. 06/25/22   Glade Lloyd, MD  metoprolol tartrate (LOPRESSOR) 25 MG tablet Take 0.5 tablets (12.5 mg total) by mouth 2 (two) times daily.  06/24/22   Tyrone Nine, MD  ondansetron (ZOFRAN) 4 MG tablet Take 1 tablet (4 mg total) by mouth every 8 (eight) hours as needed for nausea or vomiting. 12/01/21   Tyrone Nine, MD      Allergies    Patient has no known allergies.    Review of Systems   Review of Systems  Physical Exam Updated Vital Signs BP (!) 142/88   Pulse (!) 102   Temp 98.5 F (36.9 C) (Oral)   Resp 16   SpO2 99%  Physical Exam Vitals reviewed.  Constitutional:      Appearance: Normal appearance.  HENT:     Head: Normocephalic.     Right Ear: External ear normal.     Left Ear: External ear normal.     Nose: Nose normal.     Mouth/Throat:     Pharynx: Oropharynx is clear.  Eyes:     Pupils: Pupils are equal, round, and reactive to light.  Cardiovascular:     Rate and Rhythm: Normal rate and regular rhythm.     Pulses: Normal pulses.  Pulmonary:     Effort: Pulmonary effort is normal.     Breath sounds: Normal breath sounds.  Abdominal:     General: Abdomen is flat. Bowel sounds are normal.  Musculoskeletal:        General: Normal range of motion.     Cervical  back: Normal range of motion.  Skin:    General: Skin is warm.     Capillary Refill: Capillary refill takes less than 2 seconds.  Neurological:     General: No focal deficit present.     Mental Status: He is alert.  Psychiatric:        Mood and Affect: Mood normal.     ED Results / Procedures / Treatments   Labs (all labs ordered are listed, but only abnormal results are displayed) Labs Reviewed  COMPREHENSIVE METABOLIC PANEL - Abnormal; Notable for the following components:      Result Value   Sodium 134 (*)    Potassium 3.1 (*)    Chloride 89 (*)    Glucose, Bld 121 (*)    BUN <5 (*)    Calcium 8.4 (*)    Albumin 3.0 (*)    Anion gap 19 (*)    All other components within normal limits  ETHANOL - Abnormal; Notable for the following components:   Alcohol, Ethyl (B) 249 (*)    All other components within normal  limits  I-STAT CHEM 8, ED - Abnormal; Notable for the following components:   Sodium 131 (*)    Chloride 89 (*)    BUN <3 (*)    Glucose, Bld 148 (*)    Calcium, Ion 0.88 (*)    All other components within normal limits  LIPASE, BLOOD    EKG None  Radiology No results found.  Procedures Procedures    Medications Ordered in ED Medications  pantoprazole (PROTONIX) injection 40 mg (has no administration in time range)  sodium chloride 0.9 % bolus 1,000 mL (has no administration in time range)  LORazepam (ATIVAN) injection 0.5 mg (has no administration in time range)  lactated ringers bolus 1,000 mL (0 mLs Intravenous Stopped 07/04/22 1115)  ondansetron (ZOFRAN) injection 4 mg (4 mg Intravenous Given 07/04/22 1005)  ondansetron (ZOFRAN) injection 4 mg (4 mg Intravenous Given 07/04/22 1315)  lactated ringers bolus 1,000 mL (0 mLs Intravenous Stopped 07/04/22 1415)    ED Course/ Medical Decision Making/ A&P Clinical Course as of 07/04/22 1457  Sat Jul 04, 2022  1449 Patient feels improved  [DR]  1450 Labs reviewed at 1320 Alcohol level continues to be elevated at 249 [DR]  1451 Sodium increased from 1 31-1 34 [DR]  1451 LFTs within normal limits [DR]    Clinical Course User Index [DR] Margarita Grizzle, MD                           Medical Decision Making 31yo male presents with nausea and vomiting after alcohol use. Patient was evaluated initially with i-STAT. He continued to have vomiting several hours later.  He had labs redrawn and received a liter of normal saline.  He received antiemetics  Patient is reevaluated.  He states he feels improved.  He is not having active vomiting at this time. Differential diagnosis includes but is not limited to acute alcohol intoxication, gastritis secondary to alcohol use, pancreatitis,   Amount and/or Complexity of Data Reviewed Labs: ordered. Decision-making details documented in ED Course.  Risk Prescription drug  management.           Final Clinical Impression(s) / ED Diagnoses Final diagnoses:  Alcoholic intoxication without complication (HCC)  Nausea and vomiting, unspecified vomiting type    Rx / DC Orders ED Discharge Orders     None  Margarita Grizzle, MD 07/04/22 [phone]

## 2022-07-04 NOTE — ED Triage Notes (Addendum)
Pt presents with c/o hiccups from drinking too much. Pt responsive to verbal stimuli but very intoxicated per EMS. Pt reports he drank 2 bottles of wine in the last 16 hours and will not report how much beer has he ingested. He does endorse drinking beer, just refuses to give an amount. Pt told EMS he wanted to come to the hospital to "feel better". Vitals for EMS, BP 152/110, P 114, R 14, CBG 134. Pt denies vomitus, but it is noted on his chin.

## 2022-07-04 NOTE — Discharge Instructions (Signed)
Please avoid alcohol.  Use zofran for nausea and vomiting.  Clear liquids only for 12 hours then advance your diet as tolerated.

## 2022-07-08 ENCOUNTER — Ambulatory Visit: Payer: Self-pay

## 2022-07-12 ENCOUNTER — Other Ambulatory Visit: Payer: Self-pay

## 2022-07-12 ENCOUNTER — Emergency Department (HOSPITAL_COMMUNITY): Payer: Medicaid Other

## 2022-07-12 ENCOUNTER — Inpatient Hospital Stay (HOSPITAL_COMMUNITY): Payer: Medicaid Other

## 2022-07-12 ENCOUNTER — Inpatient Hospital Stay (HOSPITAL_COMMUNITY)
Admission: EM | Admit: 2022-07-12 | Discharge: 2022-07-18 | DRG: 871 | Disposition: A | Discharge status: Home / Self Care | Payer: Medicaid Other | Attending: Internal Medicine | Admitting: Internal Medicine

## 2022-07-12 ENCOUNTER — Encounter (HOSPITAL_COMMUNITY): Payer: Self-pay

## 2022-07-12 DIAGNOSIS — I10 Essential (primary) hypertension: Secondary | ICD-10-CM | POA: Diagnosis present

## 2022-07-12 DIAGNOSIS — F10139 Alcohol abuse with withdrawal, unspecified: Secondary | ICD-10-CM | POA: Diagnosis present

## 2022-07-12 DIAGNOSIS — L97529 Non-pressure chronic ulcer of other part of left foot with unspecified severity: Secondary | ICD-10-CM | POA: Diagnosis present

## 2022-07-12 DIAGNOSIS — E860 Dehydration: Secondary | ICD-10-CM | POA: Diagnosis present

## 2022-07-12 DIAGNOSIS — E876 Hypokalemia: Secondary | ICD-10-CM | POA: Diagnosis present

## 2022-07-12 DIAGNOSIS — L97501 Non-pressure chronic ulcer of other part of unspecified foot limited to breakdown of skin: Secondary | ICD-10-CM

## 2022-07-12 DIAGNOSIS — A419 Sepsis, unspecified organism: Principal | ICD-10-CM | POA: Diagnosis present

## 2022-07-12 DIAGNOSIS — Z7901 Long term (current) use of anticoagulants: Secondary | ICD-10-CM

## 2022-07-12 DIAGNOSIS — R1310 Dysphagia, unspecified: Secondary | ICD-10-CM | POA: Diagnosis not present

## 2022-07-12 DIAGNOSIS — N179 Acute kidney failure, unspecified: Secondary | ICD-10-CM | POA: Diagnosis present

## 2022-07-12 DIAGNOSIS — Z1152 Encounter for screening for COVID-19: Secondary | ICD-10-CM | POA: Diagnosis not present

## 2022-07-12 DIAGNOSIS — Z8249 Family history of ischemic heart disease and other diseases of the circulatory system: Secondary | ICD-10-CM | POA: Diagnosis not present

## 2022-07-12 DIAGNOSIS — G40909 Epilepsy, unspecified, not intractable, without status epilepticus: Secondary | ICD-10-CM | POA: Diagnosis present

## 2022-07-12 DIAGNOSIS — Z79899 Other long term (current) drug therapy: Secondary | ICD-10-CM

## 2022-07-12 DIAGNOSIS — F10939 Alcohol use, unspecified with withdrawal, unspecified: Secondary | ICD-10-CM

## 2022-07-12 DIAGNOSIS — R6521 Severe sepsis with septic shock: Secondary | ICD-10-CM | POA: Diagnosis present

## 2022-07-12 DIAGNOSIS — L97519 Non-pressure chronic ulcer of other part of right foot with unspecified severity: Secondary | ICD-10-CM | POA: Diagnosis present

## 2022-07-12 DIAGNOSIS — Z86711 Personal history of pulmonary embolism: Secondary | ICD-10-CM | POA: Diagnosis not present

## 2022-07-12 DIAGNOSIS — R569 Unspecified convulsions: Secondary | ICD-10-CM | POA: Diagnosis present

## 2022-07-12 DIAGNOSIS — F10129 Alcohol abuse with intoxication, unspecified: Secondary | ICD-10-CM | POA: Diagnosis present

## 2022-07-12 DIAGNOSIS — R739 Hyperglycemia, unspecified: Secondary | ICD-10-CM | POA: Diagnosis present

## 2022-07-12 DIAGNOSIS — E878 Other disorders of electrolyte and fluid balance, not elsewhere classified: Secondary | ICD-10-CM | POA: Diagnosis present

## 2022-07-12 DIAGNOSIS — F1010 Alcohol abuse, uncomplicated: Secondary | ICD-10-CM | POA: Diagnosis not present

## 2022-07-12 DIAGNOSIS — E440 Moderate protein-calorie malnutrition: Secondary | ICD-10-CM | POA: Diagnosis present

## 2022-07-12 DIAGNOSIS — F10931 Alcohol use, unspecified with withdrawal delirium: Secondary | ICD-10-CM | POA: Diagnosis not present

## 2022-07-12 DIAGNOSIS — E872 Acidosis, unspecified: Secondary | ICD-10-CM | POA: Diagnosis present

## 2022-07-12 DIAGNOSIS — E871 Hypo-osmolality and hyponatremia: Secondary | ICD-10-CM | POA: Diagnosis present

## 2022-07-12 DIAGNOSIS — G9341 Metabolic encephalopathy: Secondary | ICD-10-CM | POA: Diagnosis not present

## 2022-07-12 DIAGNOSIS — Z91199 Patient's noncompliance with other medical treatment and regimen due to unspecified reason: Secondary | ICD-10-CM

## 2022-07-12 DIAGNOSIS — J189 Pneumonia, unspecified organism: Secondary | ICD-10-CM | POA: Diagnosis present

## 2022-07-12 DIAGNOSIS — Z6828 Body mass index (BMI) 28.0-28.9, adult: Secondary | ICD-10-CM

## 2022-07-12 DIAGNOSIS — F10929 Alcohol use, unspecified with intoxication, unspecified: Secondary | ICD-10-CM | POA: Diagnosis present

## 2022-07-12 LAB — CBC WITH DIFFERENTIAL/PLATELET
Abs Immature Granulocytes: 0.81 10*3/uL — ABNORMAL HIGH (ref 0.00–0.07)
Basophils Absolute: 0.1 10*3/uL (ref 0.0–0.1)
Basophils Relative: 0 %
Eosinophils Absolute: 0 10*3/uL (ref 0.0–0.5)
Eosinophils Relative: 0 %
HCT: 40.4 % (ref 39.0–52.0)
Hemoglobin: 14 g/dL (ref 13.0–17.0)
Immature Granulocytes: 2 %
Lymphocytes Relative: 3 %
Lymphs Abs: 1 10*3/uL (ref 0.7–4.0)
MCH: 32.9 pg (ref 26.0–34.0)
MCHC: 34.7 g/dL (ref 30.0–36.0)
MCV: 94.8 fL (ref 80.0–100.0)
Monocytes Absolute: 2.2 10*3/uL — ABNORMAL HIGH (ref 0.1–1.0)
Monocytes Relative: 7 %
Neutro Abs: 29.4 10*3/uL — ABNORMAL HIGH (ref 1.7–7.7)
Neutrophils Relative %: 88 %
Platelets: 267 10*3/uL (ref 150–400)
RBC: 4.26 MIL/uL (ref 4.22–5.81)
RDW: 12.1 % (ref 11.5–15.5)
WBC: 33.5 10*3/uL — ABNORMAL HIGH (ref 4.0–10.5)
nRBC: 0.1 % (ref 0.0–0.2)

## 2022-07-12 LAB — RESP PANEL BY RT-PCR (RSV, FLU A&B, COVID)  RVPGX2
Influenza A by PCR: NEGATIVE
Influenza B by PCR: NEGATIVE
Resp Syncytial Virus by PCR: NEGATIVE
SARS Coronavirus 2 by RT PCR: NEGATIVE

## 2022-07-12 LAB — I-STAT VENOUS BLOOD GAS, ED
Acid-base deficit: 9 mmol/L — ABNORMAL HIGH (ref 0.0–2.0)
Bicarbonate: 14.5 mmol/L — ABNORMAL LOW (ref 20.0–28.0)
Calcium, Ion: 0.77 mmol/L — CL (ref 1.15–1.40)
HCT: 45 % (ref 39.0–52.0)
Hemoglobin: 15.3 g/dL (ref 13.0–17.0)
O2 Saturation: 98 %
Potassium: 4.8 mmol/L (ref 3.5–5.1)
Sodium: 112 mmol/L — CL (ref 135–145)
TCO2: 15 mmol/L — ABNORMAL LOW (ref 22–32)
pCO2, Ven: 24.8 mmHg — ABNORMAL LOW (ref 44–60)
pH, Ven: 7.375 (ref 7.25–7.43)
pO2, Ven: 99 mmHg — ABNORMAL HIGH (ref 32–45)

## 2022-07-12 LAB — GLUCOSE, CAPILLARY
Glucose-Capillary: 144 mg/dL — ABNORMAL HIGH (ref 70–99)
Glucose-Capillary: 139 mg/dL — ABNORMAL HIGH (ref 70–99)

## 2022-07-12 LAB — I-STAT CHEM 8, ED
BUN: 44 mg/dL — ABNORMAL HIGH (ref 6–20)
Calcium, Ion: 0.76 mmol/L — CL (ref 1.15–1.40)
Chloride: 73 mmol/L — ABNORMAL LOW (ref 98–111)
Creatinine, Ser: 6.3 mg/dL — ABNORMAL HIGH (ref 0.61–1.24)
Glucose, Bld: 235 mg/dL — ABNORMAL HIGH (ref 70–99)
HCT: 44 % (ref 39.0–52.0)
Hemoglobin: 15 g/dL (ref 13.0–17.0)
Potassium: 4.8 mmol/L (ref 3.5–5.1)
Sodium: 111 mmol/L — CL (ref 135–145)
TCO2: 16 mmol/L — ABNORMAL LOW (ref 22–32)

## 2022-07-12 LAB — PROTIME-INR
INR: 1.2 (ref 0.8–1.2)
Prothrombin Time: 15.5 seconds — ABNORMAL HIGH (ref 11.4–15.2)

## 2022-07-12 LAB — I-STAT ARTERIAL BLOOD GAS, ED
Acid-base deficit: 3 mmol/L — ABNORMAL HIGH (ref 0.0–2.0)
Bicarbonate: 20.3 mmol/L (ref 20.0–28.0)
Calcium, Ion: 0.91 mmol/L — ABNORMAL LOW (ref 1.15–1.40)
HCT: 37 % — ABNORMAL LOW (ref 39.0–52.0)
Hemoglobin: 12.6 g/dL — ABNORMAL LOW (ref 13.0–17.0)
O2 Saturation: 99 %
Potassium: 2.3 mmol/L — CL (ref 3.5–5.1)
Sodium: 115 mmol/L — CL (ref 135–145)
TCO2: 21 mmol/L — ABNORMAL LOW (ref 22–32)
pCO2 arterial: 32 mmHg (ref 32–48)
pH, Arterial: 7.411 (ref 7.35–7.45)
pO2, Arterial: 132 mmHg — ABNORMAL HIGH (ref 83–108)

## 2022-07-12 LAB — URINALYSIS, ROUTINE W REFLEX MICROSCOPIC
Bilirubin Urine: NEGATIVE
Glucose, UA: 50 mg/dL — AB
Ketones, ur: NEGATIVE mg/dL
Leukocytes,Ua: NEGATIVE
Nitrite: NEGATIVE
Protein, ur: >=300 mg/dL — AB
Specific Gravity, Urine: 1.022 (ref 1.005–1.030)
pH: 5 (ref 5.0–8.0)

## 2022-07-12 LAB — COMPREHENSIVE METABOLIC PANEL
ALT: 24 U/L (ref 0–44)
AST: 99 U/L — ABNORMAL HIGH (ref 15–41)
Albumin: 3.1 g/dL — ABNORMAL LOW (ref 3.5–5.0)
Alkaline Phosphatase: 70 U/L (ref 38–126)
Anion gap: 32 — ABNORMAL HIGH (ref 5–15)
BUN: 32 mg/dL — ABNORMAL HIGH (ref 6–20)
CO2: 23 mmol/L (ref 22–32)
Calcium: 8 mg/dL — ABNORMAL LOW (ref 8.9–10.3)
Chloride: 66 mmol/L — ABNORMAL LOW (ref 98–111)
Creatinine, Ser: 5.27 mg/dL — ABNORMAL HIGH (ref 0.61–1.24)
GFR, Estimated: 14 mL/min — ABNORMAL LOW (ref >60)
Glucose, Bld: 86 mg/dL (ref 70–99)
Potassium: 3 mmol/L — ABNORMAL LOW (ref 3.5–5.1)
Sodium: 121 mmol/L — ABNORMAL LOW (ref 135–145)
Total Bilirubin: 1.1 mg/dL (ref 0.3–1.2)
Total Protein: 6.2 g/dL — ABNORMAL LOW (ref 6.5–8.1)

## 2022-07-12 LAB — CBG MONITORING, ED: Glucose-Capillary: 263 mg/dL — ABNORMAL HIGH (ref 70–99)

## 2022-07-12 LAB — RAPID URINE DRUG SCREEN, HOSP PERFORMED
Amphetamines: NOT DETECTED
Barbiturates: POSITIVE — AB
Benzodiazepines: POSITIVE — AB
Cocaine: NOT DETECTED
Opiates: NOT DETECTED
Tetrahydrocannabinol: NOT DETECTED

## 2022-07-12 LAB — TROPONIN I (HIGH SENSITIVITY)
Troponin I (High Sensitivity): 23 ng/L — ABNORMAL HIGH (ref <18)
Troponin I (High Sensitivity): 19 ng/L — ABNORMAL HIGH (ref <18)

## 2022-07-12 LAB — CBC
HCT: 36.1 % — ABNORMAL LOW (ref 39.0–52.0)
Hemoglobin: 12.9 g/dL — ABNORMAL LOW (ref 13.0–17.0)
MCH: 32.9 pg (ref 26.0–34.0)
MCHC: 35.7 g/dL (ref 30.0–36.0)
MCV: 92.1 fL (ref 80.0–100.0)
Platelets: 230 10*3/uL (ref 150–400)
RBC: 3.92 MIL/uL — ABNORMAL LOW (ref 4.22–5.81)
RDW: 11.9 % (ref 11.5–15.5)
WBC: 30.3 10*3/uL — ABNORMAL HIGH (ref 4.0–10.5)
nRBC: 0 % (ref 0.0–0.2)

## 2022-07-12 LAB — LACTIC ACID, PLASMA
Lactic Acid, Venous: >9 mmol/L (ref 0.5–1.9)
Lactic Acid, Venous: 8 mmol/L (ref 0.5–1.9)
Lactic Acid, Venous: 1.9 mmol/L (ref 0.5–1.9)

## 2022-07-12 LAB — PHOSPHORUS: Phosphorus: 9.3 mg/dL — ABNORMAL HIGH (ref 2.5–4.6)

## 2022-07-12 LAB — ETHANOL: Alcohol, Ethyl (B): <10 mg/dL (ref <10)

## 2022-07-12 LAB — SODIUM: Sodium: 121 mmol/L — ABNORMAL LOW (ref 135–145)

## 2022-07-12 LAB — MRSA NEXT GEN BY PCR, NASAL: MRSA by PCR Next Gen: NOT DETECTED

## 2022-07-12 LAB — MAGNESIUM: Magnesium: 3.5 mg/dL — ABNORMAL HIGH (ref 1.7–2.4)

## 2022-07-12 MED ORDER — ORAL CARE MOUTH RINSE: 15.0000 mL | OROMUCOSAL | Status: DC | PRN

## 2022-07-12 MED ORDER — PHENOBARBITAL SODIUM 65 MG/ML IJ SOLN
32.5000 mg | Freq: Three times a day (TID) | INTRAMUSCULAR | Status: DC
  Filled 2022-07-12: qty 1

## 2022-07-12 MED ORDER — DEXTROSE 5 % IV SOLN: INTRAVENOUS | Status: DC

## 2022-07-12 MED ORDER — SODIUM CHLORIDE 0.9 % IV SOLN
250.0000 mL | INTRAVENOUS | Status: DC
  Administered 2022-07-12: 250 mL via INTRAVENOUS

## 2022-07-12 MED ORDER — SODIUM CHLORIDE 0.9 % IV SOLN
1.0000 g | INTRAVENOUS | Status: DC
  Administered 2022-07-12: 1 g via INTRAVENOUS
  Filled 2022-07-12 (×2): qty 10

## 2022-07-12 MED ORDER — PHENOBARBITAL SODIUM 130 MG/ML IJ SOLN
97.5000 mg | Freq: Three times a day (TID) | INTRAMUSCULAR | Status: AC
  Administered 2022-07-12 – 2022-07-14 (×6): 97.5 mg via INTRAVENOUS
  Filled 2022-07-12 (×6): qty 1

## 2022-07-12 MED ORDER — INSULIN ASPART 100 UNIT/ML IJ SOLN
0.0000 [IU] | INTRAMUSCULAR | Status: DC
  Administered 2022-07-12 – 2022-07-14 (×8): 2 [IU] via SUBCUTANEOUS

## 2022-07-12 MED ORDER — ORAL CARE MOUTH RINSE: 15.0000 mL | OROMUCOSAL | Status: DC

## 2022-07-12 MED ORDER — FOLIC ACID 1 MG PO TABS: 1.0000 mg | ORAL_TABLET | Freq: Every day | ORAL | Status: DC

## 2022-07-12 MED ORDER — DOCUSATE SODIUM 100 MG PO CAPS: 100.0000 mg | ORAL_CAPSULE | Freq: Two times a day (BID) | ORAL | Status: DC | PRN

## 2022-07-12 MED ORDER — LABETALOL HCL 5 MG/ML IV SOLN
10.0000 mg | INTRAVENOUS | Status: DC | PRN
  Administered 2022-07-12 – 2022-07-14 (×4): 10 mg via INTRAVENOUS
  Filled 2022-07-12 (×4): qty 4

## 2022-07-12 MED ORDER — SODIUM CHLORIDE 0.9 % IV BOLUS
1000.0000 mL | Freq: Once | INTRAVENOUS | Status: AC
  Administered 2022-07-12: 1000 mL via INTRAVENOUS

## 2022-07-12 MED ORDER — PHENOL 1.4 % MT LIQD
1.0000 | OROMUCOSAL | Status: DC | PRN
  Administered 2022-07-12 – 2022-07-14 (×5): 1 via OROMUCOSAL
  Filled 2022-07-12: qty 177

## 2022-07-12 MED ORDER — SODIUM CHLORIDE 0.9 % IV SOLN: 2500.0000 mg | Freq: Two times a day (BID) | INTRAVENOUS | Status: DC

## 2022-07-12 MED ORDER — LIP MEDEX EX OINT
TOPICAL_OINTMENT | CUTANEOUS | Status: DC | PRN
  Administered 2022-07-13: 75 via TOPICAL
  Filled 2022-07-12 (×2): qty 7

## 2022-07-12 MED ORDER — NOREPINEPHRINE 4 MG/250ML-% IV SOLN: 2.0000 ug/min | INTRAVENOUS | Status: DC

## 2022-07-12 MED ORDER — VANCOMYCIN HCL 2000 MG/400ML IV SOLN
2000.0000 mg | Freq: Once | INTRAVENOUS | Status: AC
  Administered 2022-07-12: 2000 mg via INTRAVENOUS
  Filled 2022-07-12: qty 400

## 2022-07-12 MED ORDER — NOREPINEPHRINE 4 MG/250ML-% IV SOLN
INTRAVENOUS | Status: AC
  Filled 2022-07-12: qty 250

## 2022-07-12 MED ORDER — LEVETIRACETAM IN NACL 1000 MG/100ML IV SOLN
1000.0000 mg | Freq: Once | INTRAVENOUS | Status: AC
  Administered 2022-07-12: 1000 mg via INTRAVENOUS

## 2022-07-12 MED ORDER — PHENOBARBITAL SODIUM 65 MG/ML IJ SOLN
65.0000 mg | Freq: Three times a day (TID) | INTRAMUSCULAR | Status: AC
  Administered 2022-07-14 – 2022-07-16 (×6): 65 mg via INTRAVENOUS
  Filled 2022-07-12 (×6): qty 1

## 2022-07-12 MED ORDER — ONDANSETRON HCL 4 MG/2ML IJ SOLN
4.0000 mg | Freq: Four times a day (QID) | INTRAMUSCULAR | Status: DC | PRN
  Administered 2022-07-12: 4 mg via INTRAVENOUS
  Filled 2022-07-12: qty 2

## 2022-07-12 MED ORDER — ADULT MULTIVITAMIN W/MINERALS CH
1.0000 | ORAL_TABLET | Freq: Every day | ORAL | Status: DC
  Administered 2022-07-13: 1 via ORAL
  Filled 2022-07-12: qty 1

## 2022-07-12 MED ORDER — POTASSIUM CHLORIDE 10 MEQ/100ML IV SOLN
10.0000 meq | INTRAVENOUS | Status: AC
  Administered 2022-07-12 (×4): 10 meq via INTRAVENOUS
  Filled 2022-07-12 (×4): qty 100

## 2022-07-12 MED ORDER — MIDAZOLAM HCL 2 MG/2ML IJ SOLN: 2.0000 mg | INTRAMUSCULAR | Status: DC | PRN

## 2022-07-12 MED ORDER — LEVETIRACETAM IN NACL 1500 MG/100ML IV SOLN
1500.0000 mg | Freq: Two times a day (BID) | INTRAVENOUS | Status: DC
  Administered 2022-07-12 – 2022-07-13 (×2): 1500 mg via INTRAVENOUS
  Filled 2022-07-12 (×3): qty 100

## 2022-07-12 MED ORDER — VANCOMYCIN VARIABLE DOSE PER UNSTABLE RENAL FUNCTION (PHARMACIST DOSING): Status: DC

## 2022-07-12 MED ORDER — LORAZEPAM 2 MG/ML IJ SOLN: 1.0000 mg | INTRAMUSCULAR | Status: DC | PRN

## 2022-07-12 MED ORDER — THIAMINE MONONITRATE 100 MG PO TABS: 100.0000 mg | ORAL_TABLET | Freq: Every day | ORAL | Status: DC

## 2022-07-12 MED ORDER — ENOXAPARIN SODIUM 30 MG/0.3ML IJ SOSY
30.0000 mg | PREFILLED_SYRINGE | INTRAMUSCULAR | Status: DC
  Filled 2022-07-12: qty 0.3

## 2022-07-12 MED ORDER — SODIUM CHLORIDE 0.9 % IV SOLN
2500.0000 mg | Freq: Once | INTRAVENOUS | Status: DC
  Filled 2022-07-12: qty 25

## 2022-07-12 MED ORDER — ENOXAPARIN SODIUM 40 MG/0.4ML IJ SOSY: 40.0000 mg | PREFILLED_SYRINGE | INTRAMUSCULAR | Status: DC

## 2022-07-12 MED ORDER — POLYETHYLENE GLYCOL 3350 17 G PO PACK: 17.0000 g | PACK | Freq: Every day | ORAL | Status: DC | PRN

## 2022-07-12 MED ORDER — CHLORHEXIDINE GLUCONATE CLOTH 2 % EX PADS
6.0000 | MEDICATED_PAD | Freq: Every day | CUTANEOUS | Status: DC
  Administered 2022-07-12 – 2022-07-14 (×3): 6 via TOPICAL

## 2022-07-12 NOTE — ED Provider Notes (Addendum)
[facility] EMERGENCY DEPARTMENT Provider Note   CSN: [id number] Arrival date & time: 07/12/22  1407     History  Chief Complaint  Patient presents with   Seizures    Aspiration, ETOH    Brett Molina is a 32 y.o. male.  32 year old male with prior medical history as detailed below presents for evaluation.  Patient arrives by EMS.  Patient reportedly called EMS reporting significant vomiting.  On EMS arrival patient was actively vomiting.  Patient then had a generalized tonic-clonic seizure.  IO was placed in the left lower extremity by EMS.  5 mg of Versed was given during transport.  Upon arrival the patient is postictal.  He is responding slowly.  Patient is disheveled and covered in vomit.  Patient is noted to have hypotension with a systolic blood pressure in the 60s.    The history is provided by the patient, the EMS personnel and medical records.       Home Medications Prior to Admission medications   Medication Sig Start Date End Date Taking? Authorizing Provider  acetaminophen (TYLENOL) 500 MG tablet Take 1,000 mg by mouth every 6 (six) hours as needed for mild pain.    [provider]  amLODipine (NORVASC) 10 MG tablet Take 1 tablet (10 mg total) by mouth daily. 10/08/21 11/07/21  Glade Lloyd, MD  APIXABAN Everlene Balls) VTE STARTER PACK (10MG  AND 5MG ) Take as directed on package: start with two-5mg  tablets twice daily for 7 days. On day 8, switch to one-5mg  tablet twice daily. 06/24/22   , MD  folic acid (FOLVITE) 1 MG tablet Take 1 tablet (1 mg total) by mouth daily. Patient not taking: Reported on 06/22/2022 10/09/21   06/24/2022, MD  levETIRAcetam (KEPPRA) 500 MG tablet Take 1 tablet (500 mg total) by mouth 2 (two) times daily. 10/08/21 11/07/21  12/08/21, MD  lisinopril (ZESTRIL) 20 MG tablet Take 1 tablet (20 mg total) by mouth daily. 06/25/22   Glade Lloyd, MD  metoprolol tartrate (LOPRESSOR) 25 MG tablet Take 0.5  tablets (12.5 mg total) by mouth 2 (two) times daily. 06/24/22   Tyrone Nine, MD  ondansetron (ZOFRAN) 8 MG tablet Take 1 tablet (8 mg total) by mouth every 8 (eight) hours as needed for nausea or vomiting. 07/04/22   Tyrone Nine, MD      Allergies    Patient has no known allergies.    Review of Systems   Review of Systems  Unable to perform ROS: Acuity of condition    Physical Exam Updated Vital Signs BP (!) 106/92   Pulse (!) 110   Resp 19   SpO2 98%  Physical Exam Vitals and nursing note reviewed.  Constitutional:      General: He is not in acute distress.    Appearance: He is well-developed.     Comments: Very slow to respond, appears postictal, disheveled and covered in vomit.  No focal weakness.    Over the course of several minutes after arrival patient's mentation gradually improved.  Patient appears to be protecting airway.    HENT:     Head: Normocephalic and atraumatic.  Eyes:     Conjunctiva/sclera: Conjunctivae normal.     Pupils: Pupils are equal, round, and reactive to light.  Cardiovascular:     Rate and Rhythm: Regular rhythm. Tachycardia present.     Heart sounds: Normal heart sounds.  Pulmonary:     Effort: Pulmonary effort is normal. No respiratory  distress.     Breath sounds: Normal breath sounds.  Abdominal:     General: There is no distension.     Palpations: Abdomen is soft.     Tenderness: There is no abdominal tenderness.  Musculoskeletal:        General: No deformity. Normal range of motion.     Cervical back: Normal range of motion and neck supple.  Skin:    General: Skin is warm and dry.     Comments: Multiple areas of skin breakdown on distal toes of both feet.  Neurological:     General: No focal deficit present.     Comments: Postictal on arrival.  Initially slow to respond.  Mentation gradually improved over the next 5 to 10 minutes.  No focal weakness appreciated.     ED Results / Procedures / Treatments   Labs (all  labs ordered are listed, but only abnormal results are displayed) Labs Reviewed  CBC WITH DIFFERENTIAL/PLATELET - Abnormal; Notable for the following components:      Result Value   WBC 33.5 (*)    All other components within normal limits  PROTIME-INR - Abnormal; Notable for the following components:   Prothrombin Time 15.5 (*)    All other components within normal limits  I-STAT CHEM 8, ED - Abnormal; Notable for the following components:   Sodium 111 (*)    Chloride 73 (*)    BUN 44 (*)    Creatinine, Ser 6.30 (*)    Glucose, Bld 235 (*)    Calcium, Ion 0.76 (*)    TCO2 16 (*)    All other components within normal limits  I-STAT VENOUS BLOOD GAS, ED - Abnormal; Notable for the following components:   pCO2, Ven 24.8 (*)    pO2, Ven 99 (*)    Bicarbonate 14.5 (*)    TCO2 15 (*)    Acid-base deficit 9.0 (*)    Sodium 112 (*)    Calcium, Ion 0.77 (*)    All other components within normal limits  CBG MONITORING, ED - Abnormal; Notable for the following components:   Glucose-Capillary 263 (*)    All other components within normal limits  I-STAT ARTERIAL BLOOD GAS, ED - Abnormal; Notable for the following components:   pO2, Arterial 132 (*)    TCO2 21 (*)    Acid-base deficit 3.0 (*)    Sodium 115 (*)    Potassium 2.3 (*)    Calcium, Ion 0.91 (*)    HCT 37.0 (*)    Hemoglobin 12.6 (*)    All other components within normal limits  RESP PANEL BY RT-PCR (RSV, FLU A&B, COVID)  RVPGX2  CULTURE, BLOOD (ROUTINE X 2)  CULTURE, BLOOD (ROUTINE X 2)  ETHANOL  COMPREHENSIVE METABOLIC PANEL  URINALYSIS, ROUTINE W REFLEX MICROSCOPIC  LACTIC ACID, PLASMA  LACTIC ACID, PLASMA  RAPID URINE DRUG SCREEN, HOSP PERFORMED  MAGNESIUM  PHOSPHORUS  TROPONIN I (HIGH SENSITIVITY)    EKG EKG Interpretation  Date/Time:  Sunday July 12 2022 14:22:53 EST Ventricular Rate:  120 PR Interval:  131 QRS Duration: 82 QT Interval:  363 QTC Calculation: 513 R Axis:   70 Text  Interpretation: Sinus tachycardia Probable anteroseptal infarct, recent Lateral leads are also involved Prolonged QT interval Confirmed by Dene Gentry [phone]) on 07/12/2022 2:53:10 PM  Radiology No results found.  Procedures Procedures    Medications Ordered in ED Medications  levETIRAcetam (KEPPRA) IVPB 1000 mg/100 mL premix (0 mg Intravenous Stopped 07/12/22 1503)  And  levETIRAcetam (KEPPRA) IVPB 1500 mg/ 100 mL premix (1,500 mg Intravenous New Bag/Given 07/12/22 1503)  ceFEPIme (MAXIPIME) 1 g in sodium chloride 0.9 % 100 mL IVPB (has no administration in time range)  vancomycin (VANCOREADY) IVPB 2000 mg/400 mL (has no administration in time range)  vancomycin variable dose per unstable renal function (pharmacist dosing) (has no administration in time range)  0.9 %  sodium chloride infusion (has no administration in time range)  norepinephrine (LEVOPHED) 4mg  in (0.016 mg/mL) premix infusion ( Intravenous Not Given 07/12/22 1507)  sodium chloride 0.9 % bolus 1,000 mL (1,000 mLs Intravenous New Bag/Given 07/12/22 1430)  norepinephrine (LEVOPHED) 4-5 MG/250ML-% infusion SOLN (  New Bag/Given 07/12/22 1433)  sodium chloride 0.9 % bolus 1,000 mL (1,000 mLs Intravenous New Bag/Given 07/12/22 1503)    ED Course/ Medical Decision Making/ A&P                           Medical Decision Making Amount and/or Complexity of Data Reviewed Labs: ordered. Radiology: ordered.  Risk Prescription drug management. Decision regarding hospitalization.    Medical Screen Complete  This patient presented to the ED with complaint of AMS, vomiting, seizure.  This complaint involves an extensive number of treatment options. The initial differential diagnosis includes, but is not limited to, metabolic abnormality, noncompliance, alcohol withdrawal, etc.  This presentation is: Acute, Chronic, Self-Limited, Previously Undiagnosed, Uncertain Prognosis, Complicated, Systemic Symptoms, and Threat to  Life/Bodily Function  Patient with known history of alcohol abuse, alcohol withdrawal, seizure disorder presents after calling EMS for 2 days of nausea and vomiting.  Patient seized during transport.  EMS reports approximately 2 to 5 minutes of generalized tonic-clonic seizure activity.  5 mg of Versed and IO placed by EMS.  Seizure stopped after administration of Versed.  On arrival the patient is postictal but his mentation slowly improved over the next several minutes.  He appears to be protecting his airway.  Hypotension noted on arrival.  IV fluids and Levophed drip initiated here in the ED.  Keppra load initiated in the ED given history of seizure.  Patient is reporting noncompliance with oral Keppra at home.  Patient appears to be clinically dehydrated.  Patient is able to provide history that he has been vomiting for 24 to 48 hours.  His last drink of alcohol was approximately 48 hours ago per report.  Given significant abnormalities on laboratory workup Critical  Care made aware of case.  They will evaluate for admission.     Additional history obtained:  Additional history obtained from EMS External records from outside sources obtained and reviewed including prior ED visits and prior Inpatient records.    Lab Tests:  I ordered and personally interpreted labs.  The pertinent results include: CBC, CMP, i-STAT Chem-8, i-STAT ABG, i-STAT VBG, blood cultures, lactic acid   Imaging Studies ordered:  I ordered imaging studies including CT head, plain films of chest, bilateral feet I agree with the radiologist interpretation.   Cardiac Monitoring:  The patient was maintained on a cardiac monitor.  I personally viewed and interpreted the cardiac monitor which showed an underlying rhythm of: Sinus tach   Medicines ordered:  I ordered medication including IV fluids, Levophed drip, broad-spectrum antibiotics, Keppra load for dehydration, hypotension, suspected infection,  seizure prophylaxis Reevaluation of the patient after these medicines showed that the patient: improved    Problem List / ED Course:  AKI, seizure, leukocytosis, hyponatremia  Reevaluation:  After the interventions noted above, I reevaluated the patient and found that they have: improved  Disposition:  After consideration of the diagnostic results and the patients response to treatment, I feel that the patent would benefit from admission.   CRITICAL CARE Performed by: Wynetta Fines   Total critical care time: 45 minutes  Critical care time was exclusive of separately billable procedures and treating other patients.  Critical care was necessary to treat or prevent imminent or life-threatening deterioration.  Critical care was time spent personally by me on the following activities: development of treatment plan with patient and/or surrogate as well as nursing, discussions with consultants, evaluation of patient's response to treatment, examination of patient, obtaining history from patient or surrogate, ordering and performing treatments and interventions, ordering and review of laboratory studies, ordering and review of radiographic studies, pulse oximetry and re-evaluation of patient's condition.          Final Clinical Impression(s) / ED Diagnoses Final diagnoses:  Seizure (HCC)  AKI (acute kidney injury) (HCC)  Hyponatremia    Rx / DC Orders ED Discharge Orders     None         Wynetta Fines, MD 07/12/22 1527    Wynetta Fines, MD 07/12/22 [phone]

## 2022-07-12 NOTE — Progress Notes (Addendum)
eLink Physician-Brief Progress Note Patient Name: Brett Molina DOB: [birth year] MRN: [id number]   Date of Service  07/12/2022  HPI/Events of Note  Hypertension. Has history of HTN but not taken medications for weeks. Lisinopril, metoprolol and amlodipine on med list. Also noted that 6 pm LA was 8.0. Comfortable on camera. HR 120. Not hypotensive. Given several fluid boluses in day time. No other symptoms reported. Also with low sodium. LA was reported > 9 earlier and does not have serial checks ordered.   eICU Interventions  Labetalol PRN for HTN Recheck LA at 10 pm  I see a follow up sodium is ordered for 8 pm - let me know when results      Intervention Category Major Interventions: Hypertension - evaluation and management  Brett Molina 07/12/2022, 7:40 PM  Addendum at 925 pm - Sodium 121. Fully awake and alert. Is getting his K repleted. Sodium was 111 on admission. It was 121 at 4 pm. Notes mention to continue aggressive hydration. Not on any fluids at this time. Foley placed at around 6 pm. Has put out around 160 cc urine. Awake and alert and has some sore throat. CT head showed nothing earlier. Will use low dose D5 to make sure sodium does not rise further especially while potassium is also being replaced. Sodium was ordered 4x daily. I changed orders to Q4 for now and have ordered a full BMP around midnight. D.w RN please let me know when it results.   Addendum at 1:40 am- Notified of BMP. Sodium stable at 120. Improving creatinine. K is low. Will give another 40 meq IV Kcl. Continue same fluids. Continue sodium check next around 430 am - they can send all AM labs then including a mag

## 2022-07-12 NOTE — ED Notes (Signed)
Pt unable to come to ct at this time. Per rn attempt again in 30 minutes

## 2022-07-12 NOTE — Procedures (Signed)
Arterial Catheter Insertion Procedure Note  Brett Molina  [id number]  17-Mar-1991  Date:07/12/22  Time:6:44 PM    Provider Performing: Solon Augusta E    Procedure: Insertion of Arterial Line [phone]) without US guidance  Indication(s) Blood pressure monitoring and/or need for frequent ABGs  Consent Risks of the procedure as well as the alternatives and risks of each were explained to the patient and/or caregiver.  Consent for the procedure was obtained and is signed in the bedside chart  Anesthesia None   Time Out Verified patient identification, verified procedure, site/side was marked, verified correct patient position, special equipment/implants available, medications/allergies/relevant history reviewed, required imaging and test results available.   Sterile Technique Maximal sterile technique including full sterile barrier drape, hand hygiene, sterile gown, sterile gloves, mask, hair covering, sterile ultrasound probe cover (if used).   Procedure Description Area of catheter insertion was cleaned with chlorhexidine and draped in sterile fashion. Without real-time ultrasound guidance an arterial catheter was placed into the left radial artery.  Appropriate arterial tracings confirmed on monitor.     Complications/Tolerance None; patient tolerated the procedure well.   EBL Minimal   Specimen(s) None

## 2022-07-12 NOTE — Progress Notes (Signed)
A-Line attempted unsuccessfully. Another RT will attempt later.

## 2022-07-12 NOTE — ED Triage Notes (Signed)
Per EMS pt called them for vomiting, patient said he had been drinking a lot over the last few days, but hasn't today.  EMS states that he started seizing while vomiting and may have aspirated.  I/O was placed in the left leg.  Multiple wounds noted to Bilateral feet.

## 2022-07-12 NOTE — Progress Notes (Signed)
Pharmacy Phenobarbital Consult Note   Pharmacy Consult for IV Phenobarbital  Indication: Alcohol Withdrawal Treatment  Labs:  Lab Results  Component Value Date   CREATININE 5.27 (H) 07/12/2022   AST 99 (H) 07/12/2022   ALT 24 07/12/2022   ALKPHOS 70 07/12/2022    Assessment: Brett Molina is a 32 y.o. year old male admitted on 07/12/2022. Patients meets criteria for high risk dosing of phenobarbital. Pharmacy consulted to dose phenobarbital.    Plan: Start high risk taper IV: Phenobarbital 97.5mg  IV push q 8h x 6 doses followed by Phenobarbital 65mg  IV push q 8h x 6 doses followed by Phenobarbital 32.4mg  IV push q 8h x 6 doses Start Lorazepam 1mg  IV q4h prn agitation     Onnie Boer, PharmD, [place], AAHIVP, CPP Infectious Disease Pharmacist 07/12/2022 6:11 PM

## 2022-07-12 NOTE — Consult Note (Signed)
[facility] Nurse Consult Note: Reason for Consult: degloved digits on bilateral feet. See photos from admitting provider uploaded to EMR Wound type:trauma, full thickness Pressure Injury POA: N/A Measurement:N/A Wound bed:red, moist Drainage (amount, consistency, odor) serous to serosanguinous in a small amount Periwound: edema Dressing procedure/placement/frequency:  I have recommended to Dr. Tacy Learn via [place] that consultation with Podiatric Medicine is indicated and he has placed an order for that consult.   In the interim, I will provide Nursing with guidance for daily topical care so that the wounds acre cleansed and covered. NS will be used as the cleansing agent and the wounds dressed with xeroform gauze topped with a folded ABD pad over the toes secured with Kerlix roll gauze/paper tape. The feet are to be placed into Prevalon boots for protection from further injury.  A sacral foam is to be placed for PI prevention.  [facility] nursing team will not follow, but will remain available to this patient, the nursing and medical teams.  Please re-consult if needed.  Thank you for inviting [place] to participate in this patient's Plan of Care.  Maudie Flakes, MSN, RN, CNS, [place], Serita Grammes, [employer], [employer] phone:  [phone]

## 2022-07-12 NOTE — Progress Notes (Signed)
Pharmacy Antibiotic Note  Brett Molina is a 32 y.o. male for which pharmacy has been consulted for cefepime and vancomycin dosing for sepsis. Patient with a history of alcohol withdrawal seizures.  Scr 6.3 - AKI   Plan: Cefepime 1g q24hr Vancomycin 2000 mg once, subsequent dosing as indicated per random vancomycin level until renal function stable and/or improved, at which time scheduled dosing can be considered Trend WBC, Fever, Renal function F/u cultures, clinical course, WBC, fever De-escalate when able F/u Nephrology plan     No data recorded.  Recent Labs  Lab 07/12/22 1441  CREATININE 6.30*    CrCl cannot be calculated (Unknown ideal weight.).    No Known Allergies  Antimicrobials this admission: vancomycin 1/7 >>  cefepime 1/7 >>   Microbiology results: Pending  Thank you for allowing pharmacy to be a part of this patient's care.  Lorelei Pont, PharmD, BCPS 07/12/2022 2:57 PM ED Clinical Pharmacist -  [phone]

## 2022-07-12 NOTE — H&P (Signed)
NAME:  Brett Molina, MRN:  [id number], DOB:  1990-08-21, LOS: 0 ADMISSION DATE:  07/12/2022, CONSULTATION DATE:  07/09/2022 REFERRING MD: Emergency department, CHIEF COMPLAINT: Seizure with EtOH abuse  History of Present Illness:  Brett Molina is a 32 year old male with a past medical history significant for EtOH abuse and hypertension who presented to the emergency department after vomiting at home reports of binge drinking few days prior.  Per EMS patient has seizure while vomiting with high concern for aspiration prior to ED arrival, 5 mg Versed given during transport.  Patient had witnessed tonic-clonic seizure in ED at which time access was placed due to concern for needing intubation.  Patient presented with hypotension and mild tachycardia.  Lab work pending due to faculty obtaining access sites.  I-STAT chemistry and CBC with sodium 111, glucose 235, creatinine 6.30, ionized calcium 0.75, troponin 23, WBC 33.3, lactic acid greater than 9.  PCCM consulted for further management and admission.  Pertinent  Medical History  Alcohol abuse Hypertension  Significant Hospital Events: Including procedures, antibiotic start and stop dates in addition to other pertinent events   1/7 presented to the ED after complaints of severe vomiting at home, patient has known history of EtOH with recent binge drinking followed by cessation.  Had witnessed tonic-clonic seizure with EMS and on ED arrival.  Interim History / Subjective:  Arousable but speech mostly incomprehensible.  Faint gag reflex  Objective   Blood pressure (!) 106/92, pulse (!) 110, resp. rate 19, SpO2 98 %.       No intake or output data in the 24 hours ending 07/12/22 1531 There were no vitals filed for this visit.  Examination: General: Acute on chronic ill-appearing deconditioned adult male lying on ED stretcher minimally interactive HEENT: [place]/AT, MM pink/moist, PERRL,  Neuro: Responsive to oral stimuli but speech  mostly incomprehensible, nonfocal CV: s1s2 regular rate and rhythm, no murmur, rubs, or gallops,  PULM: Bilateral rhonchi, diminished gag reflex but present, on nasal cannula currently, no increased work of breathing GI: soft, bowel sounds active in all 4 quadrants, non-tender, non-distended Extremities: warm/dry, no edema  Skin: multiple injuries to bilateral feet   Resolved Hospital Problem list     Assessment & Plan:  Altered mental status in the setting of likely alcohol withdrawal related seizures and metabolic encephalopathy -On assessment difficult to obtain history from patient given mentation but per ED nurse patient reports binge drinking few days prior to admission and then complete cessation of alcohol.  Unknown time of last drink P: Maintain neuro protective measures; goal for eurothermia, euglycemia, eunatermia, normoxia, and PCO2 goal of 35-40 Nutrition and bowel regiment  Seizure precautions  Aspirations precautions  CIWA protocol  Likely evolving sepsis due to underlying aspiration pneumonia -ER workup pending at time of evaluation including chest x-ray but clinically patient appears to have signs of pneumonia.  On presentation patient had mild hypotension, tachycardia, and elevated lactic greater than 9 P: Admit ICU Supplemental oxygen Pan cultures prior to antibiotic IV antibiotics vanc and Zosyn  Aggressive IV hydration Pressor support for MAP< 65  Trend lactic acid Procalcitonin Monitor urine output  At risk airway compromise -In the setting of likely alcohol withdrawal seizures and acidosis P: Patient is high risk for needing advanced airway for protection of airway Close monitoring in the ICU Aggressive pulmonary hygiene N.p.o. Aspiration precautions  EtOH abuse P: Seizure precautions as above CIWA protocol Start phenobarbital taper Monitor for signs of withdrawal  Severe acute kidney  injury -Creatinine on admission 6.30 on Istat, creatinine  0.70 December 2023.  Awaiting official chemistry Acidosis P: Follow renal function Monitor urine output Trend Bmet Avoid nephrotoxins Ensure adequate renal perfusion  Aggressive IV hydration Obtain urine lytes  Consider renal ultrasound   Hyperglycemia P: Trend if reading remains above 180 initiate SSI  Multiple wounds to bilateral feet P: Wound care consult  Best Practice (right click and "Reselect all SmartList Selections" daily)   Diet/type: NPO DVT prophylaxis: prophylactic heparin  GI prophylaxis: PPI Lines: Central line Foley:  N/A Code Status:  full code Last date of multidisciplinary goals of care discussion continue  Labs   CBC: Recent Labs  Lab 07/12/22 1432 07/12/22 1441 07/12/22 1509  WBC 33.5*  --   --   NEUTROABS PENDING  --   --   HGB 14.0 15.0  15.3 12.6*  HCT 40.4 44.0  45.0 37.0*  MCV 94.8  --   --   PLT 267  --   --     Basic Metabolic Panel: Recent Labs  Lab 07/12/22 1441 07/12/22 1509  NA 111*  112* 115*  K 4.8  4.8 2.3*  CL 73*  --   GLUCOSE 235*  --   BUN 44*  --   CREATININE 6.30*  --    GFR: CrCl cannot be calculated (Unknown ideal weight.). Recent Labs  Lab 07/12/22 1432  WBC 33.5*    Liver Function Tests: No results for input(s): "AST", "ALT", "ALKPHOS", "BILITOT", "PROT", "ALBUMIN" in the last 168 hours. No results for input(s): "LIPASE", "AMYLASE" in the last 168 hours. No results for input(s): "AMMONIA" in the last 168 hours.  ABG    Component Value Date/Time   PHART 7.411 07/12/2022 1509   PCO2ART 32.0 07/12/2022 1509   PO2ART 132 (H) 07/12/2022 1509   HCO3 20.3 07/12/2022 1509   TCO2 21 (L) 07/12/2022 1509   ACIDBASEDEF 3.0 (H) 07/12/2022 1509   O2SAT 99 07/12/2022 1509     Coagulation Profile: Recent Labs  Lab 07/12/22 1432  INR 1.2    Cardiac Enzymes: No results for input(s): "CKTOTAL", "CKMB", "CKMBINDEX", "TROPONINI" in the last 168 hours.  HbA1C: Hgb A1c MFr Bld  Date/Time Value Ref  Range Status  06/18/2022 06:11 PM 5.3 4.8 - 5.6 % Final    Comment:    (NOTE)         Prediabetes: 5.7 - 6.4         Diabetes: >6.4         Glycemic control for adults with diabetes: <7.0   11/20/2018 04:32 PM 5.7 (H) 4.8 - 5.6 % Final    Comment:    (NOTE) Pre diabetes:          5.7%-6.4% Diabetes:              >6.4% Glycemic control for   <7.0% adults with diabetes     CBG: Recent Labs  Lab 07/12/22 1427  GLUCAP 263*    Review of Systems:   Assess  Past Medical History:  He,  has a past medical history of ETOH abuse and Hypertension.   Surgical History:   Past Surgical History:  Procedure Laterality Date   ESOPHAGOGASTRODUODENOSCOPY N/A 07/24/2018   Procedure: ESOPHAGOGASTRODUODENOSCOPY (EGD);  Surgeon: Rachael Fee, MD;  Location: [facility];  Service: Endoscopy;  Laterality: N/A;     Social History:   reports that he has never smoked. He has never used smokeless tobacco. He reports current alcohol use of about  21.0 standard drinks of alcohol per week. He reports that he does not use drugs.   Family History:  His family history includes Hypertension in his father and mother.   Allergies No Known Allergies   Home Medications  Prior to Admission medications   Medication Sig Start Date End Date Taking? Authorizing Provider  acetaminophen (TYLENOL) 500 MG tablet Take 1,000 mg by mouth every 6 (six) hours as needed for mild pain.    [provider]  amLODipine (NORVASC) 10 MG tablet Take 1 tablet (10 mg total) by mouth daily. 10/08/21 11/07/21  Aline August, MD  APIXABAN Arne Cleveland) VTE STARTER PACK (10MG  AND 5MG ) Take as directed on package: start with two-5mg  tablets twice daily for 7 days. On day 8, switch to one-5mg  tablet twice daily. 06/24/22   Patrecia Pour, MD  folic acid (FOLVITE) 1 MG tablet Take 1 tablet (1 mg total) by mouth daily. Patient not taking: Reported on 06/22/2022 10/09/21   Aline August, MD  levETIRAcetam (KEPPRA) 500 MG tablet  Take 1 tablet (500 mg total) by mouth 2 (two) times daily. 10/08/21 11/07/21  Aline August, MD  lisinopril (ZESTRIL) 20 MG tablet Take 1 tablet (20 mg total) by mouth daily. 06/25/22   Patrecia Pour, MD  metoprolol tartrate (LOPRESSOR) 25 MG tablet Take 0.5 tablets (12.5 mg total) by mouth 2 (two) times daily. 06/24/22   Patrecia Pour, MD  ondansetron (ZOFRAN) 8 MG tablet Take 1 tablet (8 mg total) by mouth every 8 (eight) hours as needed for nausea or vomiting. 07/04/22   Pattricia Boss, MD     Critical care time: 45 min  Sheila Gervasi D. Harris, NP-C [facility] Personal contact information can be found on Amion  If no contact or response made please call [phone] 07/12/2022, 4:15 PM

## 2022-07-13 ENCOUNTER — Inpatient Hospital Stay (HOSPITAL_COMMUNITY): Payer: Medicaid Other

## 2022-07-13 LAB — CBC WITH DIFFERENTIAL/PLATELET
Abs Immature Granulocytes: 0.22 10*3/uL — ABNORMAL HIGH (ref 0.00–0.07)
Basophils Absolute: 0 10*3/uL (ref 0.0–0.1)
Basophils Relative: 0 %
Eosinophils Absolute: 0 10*3/uL (ref 0.0–0.5)
Eosinophils Relative: 0 %
HCT: 30.2 % — ABNORMAL LOW (ref 39.0–52.0)
Hemoglobin: 10.8 g/dL — ABNORMAL LOW (ref 13.0–17.0)
Immature Granulocytes: 1 %
Lymphocytes Relative: 4 %
Lymphs Abs: 1 10*3/uL (ref 0.7–4.0)
MCH: 32.8 pg (ref 26.0–34.0)
MCHC: 35.8 g/dL (ref 30.0–36.0)
MCV: 91.8 fL (ref 80.0–100.0)
Monocytes Absolute: 1.7 10*3/uL — ABNORMAL HIGH (ref 0.1–1.0)
Monocytes Relative: 7 %
Neutro Abs: 21.9 10*3/uL — ABNORMAL HIGH (ref 1.7–7.7)
Neutrophils Relative %: 88 %
Platelets: 176 10*3/uL (ref 150–400)
RBC: 3.29 MIL/uL — ABNORMAL LOW (ref 4.22–5.81)
RDW: 11.9 % (ref 11.5–15.5)
WBC: 24.9 10*3/uL — ABNORMAL HIGH (ref 4.0–10.5)
nRBC: 0 % (ref 0.0–0.2)

## 2022-07-13 LAB — BASIC METABOLIC PANEL
Anion gap: 20 — ABNORMAL HIGH (ref 5–15)
Anion gap: 20 — ABNORMAL HIGH (ref 5–15)
Anion gap: 18 — ABNORMAL HIGH (ref 5–15)
BUN: 31 mg/dL — ABNORMAL HIGH (ref 6–20)
BUN: 33 mg/dL — ABNORMAL HIGH (ref 6–20)
BUN: 33 mg/dL — ABNORMAL HIGH (ref 6–20)
CO2: 26 mmol/L (ref 22–32)
CO2: 25 mmol/L (ref 22–32)
CO2: 25 mmol/L (ref 22–32)
Calcium: 7.6 mg/dL — ABNORMAL LOW (ref 8.9–10.3)
Calcium: 7.8 mg/dL — ABNORMAL LOW (ref 8.9–10.3)
Calcium: 8 mg/dL — ABNORMAL LOW (ref 8.9–10.3)
Chloride: 74 mmol/L — ABNORMAL LOW (ref 98–111)
Chloride: 75 mmol/L — ABNORMAL LOW (ref 98–111)
Chloride: 81 mmol/L — ABNORMAL LOW (ref 98–111)
Creatinine, Ser: 4.71 mg/dL — ABNORMAL HIGH (ref 0.61–1.24)
Creatinine, Ser: 4.13 mg/dL — ABNORMAL HIGH (ref 0.61–1.24)
Creatinine, Ser: 2.6 mg/dL — ABNORMAL HIGH (ref 0.61–1.24)
GFR, Estimated: 16 mL/min — ABNORMAL LOW (ref >60)
GFR, Estimated: 19 mL/min — ABNORMAL LOW (ref >60)
GFR, Estimated: 32 mL/min — ABNORMAL LOW (ref >60)
Glucose, Bld: 120 mg/dL — ABNORMAL HIGH (ref 70–99)
Glucose, Bld: 125 mg/dL — ABNORMAL HIGH (ref 70–99)
Glucose, Bld: 102 mg/dL — ABNORMAL HIGH (ref 70–99)
Potassium: 2.8 mmol/L — ABNORMAL LOW (ref 3.5–5.1)
Potassium: 2.9 mmol/L — ABNORMAL LOW (ref 3.5–5.1)
Potassium: 2.7 mmol/L — CL (ref 3.5–5.1)
Sodium: 120 mmol/L — ABNORMAL LOW (ref 135–145)
Sodium: 120 mmol/L — ABNORMAL LOW (ref 135–145)
Sodium: 124 mmol/L — ABNORMAL LOW (ref 135–145)

## 2022-07-13 LAB — APTT
aPTT: 28 seconds (ref 24–36)
aPTT: 93 seconds — ABNORMAL HIGH (ref 24–36)

## 2022-07-13 LAB — STREP PNEUMONIAE URINARY ANTIGEN: Strep Pneumo Urinary Antigen: NEGATIVE

## 2022-07-13 LAB — GLUCOSE, CAPILLARY
Glucose-Capillary: 137 mg/dL — ABNORMAL HIGH (ref 70–99)
Glucose-Capillary: 128 mg/dL — ABNORMAL HIGH (ref 70–99)
Glucose-Capillary: 98 mg/dL (ref 70–99)
Glucose-Capillary: 124 mg/dL — ABNORMAL HIGH (ref 70–99)
Glucose-Capillary: 98 mg/dL (ref 70–99)

## 2022-07-13 LAB — PHOSPHORUS: Phosphorus: 7.5 mg/dL — ABNORMAL HIGH (ref 2.5–4.6)

## 2022-07-13 LAB — POTASSIUM: Potassium: 2.7 mmol/L — CL (ref 3.5–5.1)

## 2022-07-13 LAB — HEPARIN LEVEL (UNFRACTIONATED): Heparin Unfractionated: <0.1 IU/mL — ABNORMAL LOW (ref 0.30–0.70)

## 2022-07-13 LAB — SODIUM
Sodium: 122 mmol/L — ABNORMAL LOW (ref 135–145)
Sodium: 124 mmol/L — ABNORMAL LOW (ref 135–145)

## 2022-07-13 LAB — MAGNESIUM: Magnesium: 2.5 mg/dL — ABNORMAL HIGH (ref 1.7–2.4)

## 2022-07-13 LAB — VANCOMYCIN, RANDOM: Vancomycin Rm: 39 ug/mL

## 2022-07-13 LAB — PROTIME-INR
INR: 1.2 (ref 0.8–1.2)
Prothrombin Time: 15.2 seconds (ref 11.4–15.2)

## 2022-07-13 MED ORDER — POTASSIUM CHLORIDE 20 MEQ PO PACK
60.0000 meq | PACK | Freq: Once | ORAL | Status: DC
  Filled 2022-07-13: qty 3

## 2022-07-13 MED ORDER — LIDOCAINE VISCOUS HCL 2 % MT SOLN
15.0000 mL | OROMUCOSAL | Status: DC | PRN
  Administered 2022-07-13 (×2): 15 mL via OROMUCOSAL
  Filled 2022-07-13 (×3): qty 15

## 2022-07-13 MED ORDER — CALCIUM GLUCONATE-NACL 1-0.675 GM/50ML-% IV SOLN
1.0000 g | Freq: Once | INTRAVENOUS | Status: AC
  Administered 2022-07-13: 1000 mg via INTRAVENOUS
  Filled 2022-07-13: qty 50

## 2022-07-13 MED ORDER — POTASSIUM CHLORIDE 10 MEQ/100ML IV SOLN
10.0000 meq | INTRAVENOUS | Status: AC
  Administered 2022-07-13 (×6): 10 meq via INTRAVENOUS
  Filled 2022-07-13 (×6): qty 100

## 2022-07-13 MED ORDER — HEPARIN BOLUS VIA INFUSION
7000.0000 [IU] | Freq: Once | INTRAVENOUS | Status: DC
  Filled 2022-07-13: qty 7000

## 2022-07-13 MED ORDER — POTASSIUM CHLORIDE 10 MEQ/100ML IV SOLN
10.0000 meq | INTRAVENOUS | Status: AC
  Administered 2022-07-13 (×4): 10 meq via INTRAVENOUS
  Filled 2022-07-13 (×4): qty 100

## 2022-07-13 MED ORDER — POTASSIUM CHLORIDE CRYS ER 20 MEQ PO TBCR
40.0000 meq | EXTENDED_RELEASE_TABLET | Freq: Once | ORAL | Status: AC
  Administered 2022-07-13: 40 meq via ORAL
  Filled 2022-07-13: qty 2

## 2022-07-13 MED ORDER — POTASSIUM CHLORIDE 20 MEQ PO PACK: 60.0000 meq | PACK | Freq: Once | ORAL | Status: DC

## 2022-07-13 MED ORDER — HEPARIN BOLUS VIA INFUSION
3500.0000 [IU] | Freq: Once | INTRAVENOUS | Status: AC
  Administered 2022-07-13: 3500 [IU] via INTRAVENOUS
  Filled 2022-07-13: qty 3500

## 2022-07-13 MED ORDER — SODIUM CHLORIDE 0.9 % IV SOLN
2.0000 g | INTRAVENOUS | Status: AC
  Administered 2022-07-13 – 2022-07-17 (×5): 2 g via INTRAVENOUS
  Filled 2022-07-13 (×6): qty 20

## 2022-07-13 MED ORDER — HEPARIN (PORCINE) 25000 UT/250ML-% IV SOLN
1700.0000 [IU]/h | INTRAVENOUS | Status: DC
  Filled 2022-07-13: qty 250

## 2022-07-13 MED ORDER — FOLIC ACID 5 MG/ML IJ SOLN
1.0000 mg | Freq: Every day | INTRAMUSCULAR | Status: DC
  Administered 2022-07-13 – 2022-07-17 (×5): 1 mg via INTRAVENOUS
  Filled 2022-07-13 (×8): qty 0.2

## 2022-07-13 MED ORDER — THIAMINE HCL 100 MG/ML IJ SOLN
100.0000 mg | Freq: Every day | INTRAMUSCULAR | Status: DC
  Administered 2022-07-13 – 2022-07-17 (×5): 100 mg via INTRAVENOUS
  Filled 2022-07-13 (×5): qty 2

## 2022-07-13 MED ORDER — PANTOPRAZOLE SODIUM 40 MG IV SOLR
40.0000 mg | Freq: Two times a day (BID) | INTRAVENOUS | Status: DC
  Administered 2022-07-13: 40 mg via INTRAVENOUS
  Filled 2022-07-13: qty 10

## 2022-07-13 MED ORDER — HEPARIN (PORCINE) 25000 UT/250ML-% IV SOLN
1800.0000 [IU]/h | INTRAVENOUS | Status: DC
  Administered 2022-07-13 – 2022-07-14 (×2): 1800 [IU]/h via INTRAVENOUS
  Filled 2022-07-13: qty 250

## 2022-07-13 MED ORDER — APIXABAN 5 MG PO TABS
5.0000 mg | ORAL_TABLET | Freq: Two times a day (BID) | ORAL | Status: DC
  Administered 2022-07-13: 5 mg via ORAL
  Filled 2022-07-13: qty 1

## 2022-07-13 MED ORDER — SODIUM CHLORIDE 0.9 % IV SOLN
6.2500 mg | Freq: Four times a day (QID) | INTRAVENOUS | Status: DC | PRN
  Filled 2022-07-13: qty 0.25

## 2022-07-13 MED ORDER — POTASSIUM CHLORIDE IN NACL 20-0.9 MEQ/L-% IV SOLN
INTRAVENOUS | Status: DC
  Filled 2022-07-13: qty 1000

## 2022-07-13 MED ORDER — SODIUM CHLORIDE 0.9 % IV SOLN
750.0000 mg | Freq: Two times a day (BID) | INTRAVENOUS | Status: DC
  Administered 2022-07-13 – 2022-07-14 (×2): 750 mg via INTRAVENOUS
  Filled 2022-07-13 (×3): qty 7.5

## 2022-07-13 MED ORDER — METOPROLOL TARTRATE 12.5 MG HALF TABLET: 12.5000 mg | ORAL_TABLET | Freq: Two times a day (BID) | ORAL | Status: DC

## 2022-07-13 NOTE — Progress Notes (Signed)
Date and time results received: 07/13/22 0251 (use smartphrase ".now" to insert current time)  Test: K Critical Value: 2.7  Name of Provider Notified: Elink  Orders Received? Or Actions Taken?: Orders Received - See Orders for details

## 2022-07-13 NOTE — Progress Notes (Signed)
Pharmacy Antibiotic Note  Brett Molina is a 32 y.o. male admitted on 07/12/2022 with pneumonia and wound infection .  SCr 4.13 and above baseline. CrCl 32 ml/min and improving. WBC 24.9 with Tmax on 100.4 F. Pharmacy has been consulted for vancomycin dosing. Patient received one dose of vancomycin on 1/7 with further doses based on levels due to renal function. Vancomycin random of 39 this morning.  Plan: Continue ceftriaxone per MD Obtain vancomycin level tomorrow morning (1/9); subsequent dosing as indicated per random vancomycin level until renal function stable and/or improved, at which time scheduled dosing can be considered  Trend WBC, Fever, Renal function F/u cultures, clinical course, WBC, fever De-escalate when able  Weight: 100.1 kg (220 lb 10.9 oz)  Temp (24hrs), Avg:99.8 F (37.7 C), Min:98.6 F (37 C), Max:100.4 F (38 C)  Recent Labs  Lab 07/12/22 1424 07/12/22 1432 07/12/22 1441 07/12/22 1621 07/12/22 1756 07/12/22 2132 07/13/22 0048 07/13/22 0414 07/13/22 0647 07/13/22 0649  WBC  --  33.5*  --  30.3*  --   --   --  24.9*  --   --   CREATININE  --   --  6.30* 5.27*  --   --  4.71*  --   --  4.13*  LATICACIDVEN >9.0*  --   --   --  8.0* 1.9  --   --   --   --   VANCORANDOM  --   --   --   --   --   --   --   --  39  --     Estimated Creatinine Clearance: 32.2 mL/min (A) (by C-G formula based on SCr of 4.13 mg/dL (H)).    No Known Allergies  Antimicrobials this admission: Cefepime 1/7 x1 Ceftriaxone 1/8 >>  Vancomycin 1/7 >>  Microbiology results: 1/7 BCx:  1/7 MRSA PCR: negative  Thank you for allowing pharmacy to be a part of this patient's care.  Jeneen Rinks, Pharm.D PGY1 Pharmacy Resident 07/13/2022 12:21 PM

## 2022-07-13 NOTE — Progress Notes (Signed)
Repeat labs reviewed: Na+ 124 K+ 2.7 (still getting K+ runs ordered earlier, refusing PO due to sore throat)  Plan: Decrease NS to 20cc/hr Additional 4 runs K+ after completing the current runs Per my read, no airway obstructing lesions, no obvious masses or fluid collections. Awaiting full read.   Julian Hy, DO 07/13/22 6:33 PM [facility]  For contact information, see Amion. If no response to pager, please call PCCM consult pager. After hours, 7PM- 7AM, please call Elink.

## 2022-07-13 NOTE — Progress Notes (Addendum)
ANTICOAGULATION CONSULT NOTE - Initial Consult  Pharmacy Consult for heparin Indication: history of PE  No Known Allergies  Patient Measurements: Weight: 100.1 kg (220 lb 10.9 oz) Heparin Dosing Weight: TBW  Vital Signs: Temp: 99.9 F (37.7 C) (01/08 1600) Pulse Rate: 95 (01/08 1600)  Labs: Recent Labs    07/12/22 1432 07/12/22 1441 07/12/22 1509 07/12/22 1621 07/13/22 0048 07/13/22 0414 07/13/22 0649  HGB 14.0   < > 12.6* 12.9*  --  10.8*  --   HCT 40.4   < > 37.0* 36.1*  --  30.2*  --   PLT 267  --   --  230  --  176  --   LABPROT 15.5*  --   --   --   --   --   --   INR 1.2  --   --   --   --   --   --   CREATININE  --    < >  --  5.27* 4.71*  --  4.13*  TROPONINIHS 23*  --   --  19*  --   --   --    < > = values in this interval not displayed.   Estimated Creatinine Clearance: 32.2 mL/min (A) (by C-G formula based on SCr of 4.13 mg/dL (H)).  Medical History: Past Medical History:  Diagnosis Date   ETOH abuse    Hypertension    Medications:  Scheduled:   Chlorhexidine Gluconate Cloth  6 each Topical Daily   folic acid  1 mg Intravenous Daily   heparin  7,000 Units Intravenous Once   insulin aspart  0-15 Units Subcutaneous Q4H   pantoprazole (PROTONIX) IV  40 mg Intravenous Q12H   PHENObarbital  97.5 mg Intravenous Q8H   Followed by   Derrill Memo ON 07/14/2022] PHENObarbital  65 mg Intravenous Q8H   Followed by   Derrill Memo ON 07/16/2022] PHENObarbital  32.5 mg Intravenous Q8H   thiamine (VITAMIN B1) injection  100 mg Intravenous Daily   vancomycin variable dose per unstable renal function (pharmacist dosing)   Does not apply See admin instructions   Assessment: DM is a 32 yo male who presented to the ED on 1/7 with nausea and vomiting. PMH includes PE diagnosed December 2023. Patient was on apixaban prior to admission and this was continued inpatient. Patient was started on apixaban starter pack on  12/20. Patient reports the last dose of apixaban taken outpatient  was about 5 days prior to admission. Patient received one dose of apixaban inpatient on 1/8 at 0951. Per RN, patient vomited shortly after dose was given this morning. Will give decreased bolus due to possible apixaban exposure this morning, will start heparin slightly before next apixaban dose that was due tonight at 2200. Pharmacy has been consulted to dose heparin as patient is having pain with swallowing. Hgb 10.8, plt 176 both trending down. No signs/symptoms of bleeding per RN.  Goal of Therapy:  Heparin level 0.3-0.7 units/ml aPTT 66-102 seconds Monitor platelets by anticoagulation protocol: Yes   Plan:  Stop apixaban Start heparin 1/8 at 1800 Give 3500 units bolus x 1 Start heparin infusion at 1800 units/hr Check baseline aPTT and PT-INR Check anti-Xa level and aPTT in 6 hours and daily while on heparin Continue to monitor H&H and platelets Jeneen Rinks 02/08/7618,5:09 PM

## 2022-07-13 NOTE — Progress Notes (Signed)
Pharmacy Electrolyte Replacement  Recent Labs:  Recent Labs    07/13/22 0414 07/13/22 0649  K  --  2.9*  MG 2.5*  --   PHOS 7.5*  --   CREATININE  --  4.13*    Low Critical Values (K </= 2.5, Phos </= 1, Mg </= 1) Present: None  MD Contacted: n/a  Plan: Potassium Chloride 10 mEq IV q1h x6 Continue to monitor electrolytes

## 2022-07-13 NOTE — Progress Notes (Addendum)
NAME:  Brett Molina, MRN:  [id number], DOB:  1991/06/15, LOS: 1 ADMISSION DATE:  07/12/2022, CONSULTATION DATE:  07/09/2022 REFERRING MD: Emergency department, CHIEF COMPLAINT: Seizure with EtOH abuse  History of Present Illness:  Brett Molina is a 32 year old male with a past medical history significant for EtOH abuse and hypertension who presented to the emergency department after vomiting at home reports of binge drinking few days prior.  Per EMS patient has seizure while vomiting with high concern for aspiration prior to ED arrival, 5 mg Versed given during transport.  Patient had witnessed tonic-clonic seizure in ED at which time access was placed due to concern for needing intubation.  Patient presented with hypotension and mild tachycardia.  Lab work pending due to faculty obtaining access sites.  I-STAT chemistry and CBC with sodium 111, glucose 235, creatinine 6.30, ionized calcium 0.75, troponin 23, WBC 33.3, lactic acid greater than 9.  PCCM consulted for further management and admission.  Pertinent  Medical History  Alcohol abuse Hypertension  Significant Hospital Events: Including procedures, antibiotic start and stop dates in addition to other pertinent events   1/7 presented to the ED after complaints of severe vomiting at home, patient has known history of EtOH with recent binge drinking followed by cessation.  Had witnessed tonic-clonic seizure with EMS and on ED arrival. 1/8 Overnight hypertensive, LA 8 > 1.9  Interim History / Subjective:    Objective   Blood pressure (!) 144/64, pulse 92, temperature 99.5 F (37.5 C), resp. rate 14, weight 100.1 kg, SpO2 98 %.        Intake/Output Summary (Last 24 hours) at 07/13/2022 0830 Last data filed at 07/13/2022 0800 Gross per 24 hour  Intake 4148.33 ml  Output 645 ml  Net 3503.33 ml   Filed Weights   07/13/22 0500  Weight: 100.1 kg    Physical Examination: General: Acutely ill-appearing male in NAD. HEENT:  [place]/AT, anicteric sclera, PERRL, moist mucous membranes. Nasal Trumpet in place Neuro: Awake, oriented x 4. Responds to verbal stimuli. Following commands consistently. Moves all 4 extremities spontaneously.  CV: RRR, no m/g/r. PULM: Breathing even and unlabored on [place]. Lung fields CTAB. GI: Soft, nontender, nondistended. Normoactive bowel sounds. Extremities: no LE edema noted. Skin: Warm/dry, multiple injuries noted to feet.    Resolved Hospital Problem list     Assessment & Plan:  Altered mental status in the setting of likely alcohol withdrawal related seizures and metabolic encephalopathy > reports of binge drinking for a few days PTA with complete cessation of ETOH. Unknown Time/Date of Last dink. Plan - Neuro protective measures - eurothemria, euglycemia eunaterima, normoxia, and PCO2 goal 35-40 - Seizure precautions - Aspiration precautions - CIWA protocol - Ensure adequate nutrition and bowel regimen - Cont Keppra - Cont thiamine and folate   - Cont Folate - Liberalize Diet - Adding Phenergan   Likely evolving sepsis due to underlying aspiration pneumonia >On presentation patient had mild hypotension, tachycardia, and elevated lactic greater than 9. CXR not showing signs of Asp PNA  Plan - Goal MAP > 65 - Trend WBC, fever curve >> WBC improving  - Blood Cx pending - Resp Panel Neg - Ensure adequate renal perfusion  - Cont Vanco (Start 1/7), Switching Cefepime to Ceftriaxone today  - Cdiff Pending   At risk airway compromise -In the setting of likely alcohol withdrawal seizures and acidosis Plan - Remains High risk for intubation  - Nasal Trumpet in place - Aggressive pulmonary hygiene - Aspiration  precautions  EtOH abuse Plan - Seizure precautions  - CIWA protocol - Phenobarbital Taper - Monitor signs for withdrawal   Severe acute kidney injury with AGMA Hyponatremia/Hypokalemia/Hyperphosphatemia  Likely Beer potomania >  Creatinine improving Plan - Trend  BMP - Replete electrolytes as indicated - Monitor I&Os - Monitor serum sodium q6hr  - Avoid nephrotoxic agents as able - Ensure adequate renal perfusion - Stopping D5 gtt - Starting NS with KCL , repeat Sodium and assess  Hyperglycemia Plan  - CBG monitoring  Multiple wounds to bilateral feet Plan - Wound care consult  Hx of PE Plan - Restarting apixaban  Hypertension  Plan  - Adding Metoprolol Po BID - Continue Labetalol PRN IV  Best Practice (right click and "Reselect all SmartList Selections" daily)   Diet/type: Regular  DVT prophylaxis: prophylactic heparin  GI prophylaxis: PPI Lines: none Foley:  N/A Code Status:  full code Last date of multidisciplinary goals of care discussion continue  Labs   CBC: Recent Labs  Lab 07/12/22 1432 07/12/22 1441 07/12/22 1509 07/12/22 1621 07/13/22 0414  WBC 33.5*  --   --  30.3* 24.9*  NEUTROABS 29.4*  --   --   --  21.9*  HGB 14.0 15.0  15.3 12.6* 12.9* 10.8*  HCT 40.4 44.0  45.0 37.0* 36.1* 30.2*  MCV 94.8  --   --  92.1 91.8  PLT 267  --   --  230 0000000    Basic Metabolic Panel: Recent Labs  Lab 07/12/22 1441 07/12/22 1509 07/12/22 1621 07/12/22 2002 07/13/22 0048 07/13/22 0144 07/13/22 0414 07/13/22 0649  NA 111*  112* 115* 121* 121* 120*  --   --  120*  K 4.8  4.8 2.3* 3.0*  --  2.8* 2.7*  --  2.9*  CL 73*  --  66*  --  74*  --   --  75*  CO2  --   --  23  --  26  --   --  25  GLUCOSE 235*  --  86  --  120*  --   --  125*  BUN 44*  --  32*  --  31*  --   --  33*  CREATININE 6.30*  --  5.27*  --  4.71*  --   --  4.13*  CALCIUM  --   --  8.0*  --  7.6*  --   --  7.8*  MG  --   --  3.5*  --   --   --  2.5*  --   PHOS  --   --  9.3*  --   --   --  7.5*  --    GFR: Estimated Creatinine Clearance: 32.2 mL/min (A) (by C-G formula based on SCr of 4.13 mg/dL (H)). Recent Labs  Lab 07/12/22 1424 07/12/22 1432 07/12/22 1621 07/12/22 1756 07/12/22 2132 07/13/22 0414  WBC  --  33.5* 30.3*  --   --   24.9*  LATICACIDVEN >9.0*  --   --  8.0* 1.9  --     Liver Function Tests: Recent Labs  Lab 07/12/22 1621  AST 99*  ALT 24  ALKPHOS 70  BILITOT 1.1  PROT 6.2*  ALBUMIN 3.1*   No results for input(s): "LIPASE", "AMYLASE" in the last 168 hours. No results for input(s): "AMMONIA" in the last 168 hours.  ABG    Component Value Date/Time   PHART 7.411 07/12/2022 1509   PCO2ART 32.0 07/12/2022 1509  PO2ART 132 (H) 07/12/2022 1509   HCO3 20.3 07/12/2022 1509   TCO2 21 (L) 07/12/2022 1509   ACIDBASEDEF 3.0 (H) 07/12/2022 1509   O2SAT 99 07/12/2022 1509     Coagulation Profile: Recent Labs  Lab 07/12/22 1432  INR 1.2    Cardiac Enzymes: No results for input(s): "CKTOTAL", "CKMB", "CKMBINDEX", "TROPONINI" in the last 168 hours.  HbA1C: Hgb A1c MFr Bld  Date/Time Value Ref Range Status  06/18/2022 06:11 PM 5.3 4.8 - 5.6 % Final    Comment:    (NOTE)         Prediabetes: 5.7 - 6.4         Diabetes: >6.4         Glycemic control for adults with diabetes: <7.0   11/20/2018 04:32 PM 5.7 (H) 4.8 - 5.6 % Final    Comment:    (NOTE) Pre diabetes:          5.7%-6.4% Diabetes:              >6.4% Glycemic control for   <7.0% adults with diabetes     CBG: Recent Labs  Lab 07/12/22 1427 07/12/22 1957 07/12/22 2312 07/13/22 0332 07/13/22 0729  GLUCAP 263* 144* 139* 137* 128*    Critical care time:    Royston Bake, NP-S  Cct deferred to Attending

## 2022-07-14 LAB — CBC
HCT: 29.3 % — ABNORMAL LOW (ref 39.0–52.0)
Hemoglobin: 10.2 g/dL — ABNORMAL LOW (ref 13.0–17.0)
MCH: 32.9 pg (ref 26.0–34.0)
MCHC: 34.8 g/dL (ref 30.0–36.0)
MCV: 94.5 fL (ref 80.0–100.0)
Platelets: 174 10*3/uL (ref 150–400)
RBC: 3.1 MIL/uL — ABNORMAL LOW (ref 4.22–5.81)
RDW: 12 % (ref 11.5–15.5)
WBC: 20.4 10*3/uL — ABNORMAL HIGH (ref 4.0–10.5)
nRBC: 0 % (ref 0.0–0.2)

## 2022-07-14 LAB — SODIUM
Sodium: 126 mmol/L — ABNORMAL LOW (ref 135–145)
Sodium: 128 mmol/L — ABNORMAL LOW (ref 135–145)
Sodium: 126 mmol/L — ABNORMAL LOW (ref 135–145)
Sodium: 128 mmol/L — ABNORMAL LOW (ref 135–145)
Sodium: 128 mmol/L — ABNORMAL LOW (ref 135–145)

## 2022-07-14 LAB — BASIC METABOLIC PANEL
Anion gap: 17 — ABNORMAL HIGH (ref 5–15)
BUN: 32 mg/dL — ABNORMAL HIGH (ref 6–20)
CO2: 25 mmol/L (ref 22–32)
Calcium: 8.1 mg/dL — ABNORMAL LOW (ref 8.9–10.3)
Chloride: 84 mmol/L — ABNORMAL LOW (ref 98–111)
Creatinine, Ser: 1.71 mg/dL — ABNORMAL HIGH (ref 0.61–1.24)
GFR, Estimated: 54 mL/min — ABNORMAL LOW (ref >60)
Glucose, Bld: 87 mg/dL (ref 70–99)
Potassium: 3 mmol/L — ABNORMAL LOW (ref 3.5–5.1)
Sodium: 126 mmol/L — ABNORMAL LOW (ref 135–145)

## 2022-07-14 LAB — APTT: aPTT: 87 seconds — ABNORMAL HIGH (ref 24–36)

## 2022-07-14 LAB — GLUCOSE, CAPILLARY
Glucose-Capillary: 129 mg/dL — ABNORMAL HIGH (ref 70–99)
Glucose-Capillary: 133 mg/dL — ABNORMAL HIGH (ref 70–99)
Glucose-Capillary: 150 mg/dL — ABNORMAL HIGH (ref 70–99)
Glucose-Capillary: 81 mg/dL (ref 70–99)
Glucose-Capillary: 92 mg/dL (ref 70–99)
Glucose-Capillary: 97 mg/dL (ref 70–99)

## 2022-07-14 LAB — C DIFFICILE QUICK SCREEN W PCR REFLEX
C Diff antigen: NEGATIVE
C Diff interpretation: NOT DETECTED
C Diff toxin: NEGATIVE

## 2022-07-14 LAB — HEPARIN LEVEL (UNFRACTIONATED)
Heparin Unfractionated: 0.57 IU/mL (ref 0.30–0.70)
Heparin Unfractionated: 0.57 IU/mL (ref 0.30–0.70)

## 2022-07-14 LAB — VANCOMYCIN, RANDOM: Vancomycin Rm: 10 ug/mL

## 2022-07-14 MED ORDER — PANTOPRAZOLE SODIUM 40 MG IV SOLR
40.0000 mg | INTRAVENOUS | Status: DC
  Administered 2022-07-14 – 2022-07-17 (×4): 40 mg via INTRAVENOUS
  Filled 2022-07-14 (×4): qty 10

## 2022-07-14 MED ORDER — POTASSIUM CHLORIDE 10 MEQ/100ML IV SOLN
10.0000 meq | INTRAVENOUS | Status: AC
  Administered 2022-07-14 (×4): 10 meq via INTRAVENOUS
  Filled 2022-07-14 (×4): qty 100

## 2022-07-14 MED ORDER — POTASSIUM CHLORIDE IN NACL 20-0.9 MEQ/L-% IV SOLN: INTRAVENOUS | Status: DC

## 2022-07-14 MED ORDER — METOPROLOL TARTRATE 12.5 MG HALF TABLET
12.5000 mg | ORAL_TABLET | Freq: Two times a day (BID) | ORAL | Status: DC
  Administered 2022-07-14 – 2022-07-16 (×6): 12.5 mg via ORAL
  Filled 2022-07-14 (×6): qty 1

## 2022-07-14 MED ORDER — LEVETIRACETAM IN NACL 1000 MG/100ML IV SOLN
1000.0000 mg | Freq: Two times a day (BID) | INTRAVENOUS | Status: DC
  Administered 2022-07-14 – 2022-07-17 (×8): 1000 mg via INTRAVENOUS
  Filled 2022-07-14 (×11): qty 100

## 2022-07-14 MED ORDER — SODIUM CHLORIDE 0.9 % IV SOLN: INTRAVENOUS | Status: DC | PRN

## 2022-07-14 MED ORDER — APIXABAN 5 MG PO TABS
5.0000 mg | ORAL_TABLET | Freq: Two times a day (BID) | ORAL | Status: DC
  Administered 2022-07-14 – 2022-07-18 (×9): 5 mg via ORAL
  Filled 2022-07-14 (×9): qty 1

## 2022-07-14 MED ORDER — VANCOMYCIN HCL 1250 MG/250ML IV SOLN
1250.0000 mg | Freq: Once | INTRAVENOUS | Status: AC
  Administered 2022-07-14: 1250 mg via INTRAVENOUS
  Filled 2022-07-14: qty 250

## 2022-07-14 MED ORDER — POTASSIUM CHLORIDE CRYS ER 10 MEQ PO TBCR
40.0000 meq | EXTENDED_RELEASE_TABLET | Freq: Once | ORAL | Status: AC
  Administered 2022-07-14: 40 meq via ORAL
  Filled 2022-07-14: qty 4

## 2022-07-14 NOTE — Progress Notes (Signed)
Na 126 resulted to  Brett Griffon NP

## 2022-07-14 NOTE — Progress Notes (Signed)
Pharmacy Antibiotic Note  Brett Molina is a 32 y.o. male admitted on 07/12/2022 with sepsis.  Pharmacy has been consulted for vancomycin dosing.  No scheduled vanc ordered given AKI, which is improving (admitted w/ SCr 6, now down to 1.7).  Random vanc level 10.  Plan: Vancomycin 1250mg  IV x1. Consider AUC dosing as renal function stabilizes.  Weight: 97 kg (213 lb 13.5 oz)  Temp (24hrs), Avg:100.1 F (37.8 C), Min:99.3 F (37.4 C), Max:100.8 F (38.2 C)  Recent Labs  Lab 07/12/22 1424 07/12/22 1432 07/12/22 1441 07/12/22 1621 07/12/22 1756 07/12/22 2132 07/13/22 0048 07/13/22 0414 07/13/22 0647 07/13/22 0649 07/13/22 1634 07/14/22 0440  WBC  --  33.5*  --  30.3*  --   --   --  24.9*  --   --   --  20.4*  CREATININE  --   --    < > 5.27*  --   --  4.71*  --   --  4.13* 2.60* 1.71*  LATICACIDVEN >9.0*  --   --   --  8.0* 1.9  --   --   --   --   --   --   VANCORANDOM  --   --   --   --   --   --   --   --  39  --   --  10   < > = values in this interval not displayed.    Estimated Creatinine Clearance: 71.4 mL/min (A) (by C-G formula based on SCr of 1.71 mg/dL (H)).    No Known Allergies   Thank you for allowing pharmacy to be a part of this patient's care.  Wynona Neat, PharmD, BCPS  07/14/2022 5:40 AM

## 2022-07-14 NOTE — Progress Notes (Addendum)
NAME:  Brett Molina, MRN:  [id number], DOB:  [birth year], LOS: 2 ADMISSION DATE:  07/12/2022, CONSULTATION DATE:  07/09/2022 REFERRING MD: Emergency department, CHIEF COMPLAINT: Seizure with EtOH abuse  History of Present Illness:  Brett Molina is a 32 year old male with a past medical history significant for EtOH abuse and hypertension who presented to the emergency department after vomiting at home reports of binge drinking few days prior.  Per EMS patient has seizure while vomiting with high concern for aspiration prior to ED arrival, 5 mg Versed given during transport.  Patient had witnessed tonic-clonic seizure in ED at which time access was placed due to concern for needing intubation.  Patient presented with hypotension and mild tachycardia.  Lab work pending due to faculty obtaining access sites.  I-STAT chemistry and CBC with sodium 111, glucose 235, creatinine 6.30, ionized calcium 0.75, troponin 23, WBC 33.3, lactic acid greater than 9.  PCCM consulted for further management and admission.  Pertinent  Medical History  Alcohol abuse Hypertension  Significant Hospital Events: Including procedures, antibiotic start and stop dates in addition to other pertinent events   1/7 presented to the ED after complaints of severe vomiting at home, patient has known history of EtOH with recent binge drinking followed by cessation.  Had witnessed tonic-clonic seizure with EMS and on ED arrival. 1/8 Overnight hypertensive, LA 8 > 1.9 ... Day: - CT Neck soft Tissue (1/8) No acute findings   Interim History / Subjective:  Pt states he feels better and his throat is not hurting as bad. Will try and advance diet to push for PO intake. Replaced Aline overnight  Objective   Blood pressure (!) 139/92, pulse (!) 106, temperature 100.2 F (37.9 C), resp. rate 15, weight 97 kg, SpO2 100 %.        Intake/Output Summary (Last 24 hours) at 07/14/2022 1016 Last data filed at 07/14/2022 1000 Gross per  24 hour  Intake 2810.36 ml  Output 3120 ml  Net -309.64 ml   Filed Weights   07/13/22 0500 07/14/22 0400  Weight: 100.1 kg 97 kg    Physical Examination: General: Acutely ill-appearing male in NAD. HEENT: [place]/AT, anicteric sclera, PERRL, moist mucous membranes. Neuro: Awake, oriented x 4. Responds to verbal stimuli. Following commands consistently. Moves all 4 extremities spontaneously.  CV: RRR, no m/g/r. PULM: Breathing even and unlabored on RA. Lung fields CTAB. GI: Soft, nontender, nondistended. Normoactive bowel sounds. Extremities: No LE edema noted. Skin: Warm/dry, no rashes.    Resolved Hospital Problem list     Assessment & Plan:  Altered mental status in the setting of likely alcohol withdrawal related seizures and metabolic encephalopathy > reports of binge drinking for a few days PTA with complete cessation of ETOH. Unknown Time/Date of Last dink. Plan - Neuro protective measures - eurothemria, euglycemia eunaterima, normoxia, and PCO2 goal 35-40 - Seizure precautions - Aspiration precautions - CIWA protocol - Cont Keppra > changing dosing in regards to renal function  - Cont thiamine and folate   - Liberalize Diet >- Ensure adequate nutrition and bowel regimen  - Cont Phenergan   Likely evolving sepsis due to underlying aspiration pneumonia >On presentation patient had mild hypotension, tachycardia, and elevated lactic greater than 9. CXR not showing signs of Asp PNA  Plan - Goal MAP > 65 - Trend WBC, fever curve  - Blood Cx > NGTD - Resp Panel Neg - Ensure adequate renal perfusion  - Cont Vanco (Start 1/7 - Stop 1/9)  Ceftriaxone (Started 1/8) x5days - Cdiff window expired with no collection noted, will monitor  At risk airway compromise -In the setting of likely alcohol withdrawal seizures and acidosis Plan - Remains High risk for intubation , drooling and complains difficulty swallowing  - CT Neck soft Tissue (1/8) No acute findings  - Aggressive  pulmonary hygiene - Aspiration precautions  EtOH abuse Plan - Seizure precautions  - CIWA protocol - Phenobarbital Taper - Monitor signs for withdrawal   Severe acute kidney injury with AGMA Hyponatremia/Hypokalemia/Hyperphosphatemia  Likely Beer potomania >  Creatinine improving Plan - Trend BMP - Replete electrolyte > additional K+ added - Monitor I&Os - Monitor serum sodium q6hr  - Avoid nephrotoxic agents as able - Ensure adequate renal perfusion - KVO with close watch on serum sodium levels   Hyperglycemia Plan  - CBG monitoring  Multiple wounds to bilateral feet Plan - Wound care consult  Hx of PE Plan -  On Heparin gtt - Will switch apixaban when tolerating orals    Hypertension  Plan  - Continue Labetalol PRN IV - Plan to add Metoprolol Po BID when tolerating PO  Best Practice (right click and "Reselect all SmartList Selections" daily)   Diet/type: Regular  DVT prophylaxis: Systemic Heparin GI prophylaxis: PPI Lines: none Foley:  N/A Code Status:  full code Last date of multidisciplinary goals of care discussion continue  Labs   CBC: Recent Labs  Lab 07/12/22 1432 07/12/22 1441 07/12/22 1509 07/12/22 1621 07/13/22 0414 07/14/22 0440  WBC 33.5*  --   --  30.3* 24.9* 20.4*  NEUTROABS 29.4*  --   --   --  21.9*  --   HGB 14.0 15.0  15.3 12.6* 12.9* 10.8* 10.2*  HCT 40.4 44.0  45.0 37.0* 36.1* 30.2* 29.3*  MCV 94.8  --   --  92.1 91.8 94.5  PLT 267  --   --  230 176 [id number]    Basic Metabolic Panel: Recent Labs  Lab 07/12/22 1621 07/12/22 2002 07/13/22 0048 07/13/22 0144 07/13/22 0414 07/13/22 0649 07/13/22 1139 07/13/22 1634 07/14/22 0110 07/14/22 0440 07/14/22 0842  NA 121*   < > 120*  --   --  120* 122* 124*  124* 126* 126* 128*  K 3.0*  --  2.8* 2.7*  --  2.9*  --  2.7*  --  3.0*  --   CL 66*  --  74*  --   --  75*  --  81*  --  84*  --   CO2 23  --  26  --   --  25  --  25  --  25  --   GLUCOSE 86  --  120*  --   --  125*   --  102*  --  87  --   BUN 32*  --  31*  --   --  33*  --  33*  --  32*  --   CREATININE 5.27*  --  4.71*  --   --  4.13*  --  2.60*  --  1.71*  --   CALCIUM 8.0*  --  7.6*  --   --  7.8*  --  8.0*  --  8.1*  --   MG 3.5*  --   --   --  2.5*  --   --   --   --   --   --   PHOS 9.3*  --   --   --  7.5*  --   --   --   --   --   --    < > = values in this interval not displayed.   GFR: Estimated Creatinine Clearance: 71.4 mL/min (A) (by C-G formula based on SCr of 1.71 mg/dL (H)). Recent Labs  Lab 07/12/22 1424 07/12/22 1432 07/12/22 1621 07/12/22 1756 07/12/22 2132 07/13/22 0414 07/14/22 0440  WBC  --  33.5* 30.3*  --   --  24.9* 20.4*  LATICACIDVEN >9.0*  --   --  8.0* 1.9  --   --     Liver Function Tests: Recent Labs  Lab 07/12/22 1621  AST 99*  ALT 24  ALKPHOS 70  BILITOT 1.1  PROT 6.2*  ALBUMIN 3.1*   No results for input(s): "LIPASE", "AMYLASE" in the last 168 hours. No results for input(s): "AMMONIA" in the last 168 hours.  ABG    Component Value Date/Time   PHART 7.411 07/12/2022 1509   PCO2ART 32.0 07/12/2022 1509   PO2ART 132 (H) 07/12/2022 1509   HCO3 20.3 07/12/2022 1509   TCO2 21 (L) 07/12/2022 1509   ACIDBASEDEF 3.0 (H) 07/12/2022 1509   O2SAT 99 07/12/2022 1509     Coagulation Profile: Recent Labs  Lab 07/12/22 1432 07/13/22 1634  INR 1.2 1.2    Cardiac Enzymes: No results for input(s): "CKTOTAL", "CKMB", "CKMBINDEX", "TROPONINI" in the last 168 hours.  HbA1C: Hgb A1c MFr Bld  Date/Time Value Ref Range Status  06/18/2022 06:11 PM 5.3 4.8 - 5.6 % Final    Comment:    (NOTE)         Prediabetes: 5.7 - 6.4         Diabetes: >6.4         Glycemic control for adults with diabetes: <7.0   11/20/2018 04:32 PM 5.7 (H) 4.8 - 5.6 % Final    Comment:    (NOTE) Pre diabetes:          5.7%-6.4% Diabetes:              >6.4% Glycemic control for   <7.0% adults with diabetes     CBG: Recent Labs  Lab 07/13/22 1535 07/13/22 1933  07/14/22 0006 07/14/22 0336 07/14/22 0728  GLUCAP 124* 98 92 97 81    Critical care time:    Erma Heritage, NP-S  Cct deferred to Attending

## 2022-07-14 NOTE — Progress Notes (Signed)
ANTICOAGULATION CONSULT NOTE   Pharmacy Consult for heparin Indication: history of PE  No Known Allergies  Patient Measurements: Weight: 100.1 kg (220 lb 10.9 oz) Heparin Dosing Weight: TBW  Vital Signs: Temp: 100.6 F (38.1 C) (01/09 0030) Temp Source: Bladder (01/09 0000) BP: 130/78 (01/09 0000) Pulse Rate: 112 (01/09 0030)  Labs: Recent Labs    07/12/22 1432 07/12/22 1441 07/12/22 1509 07/12/22 1621 07/13/22 0048 07/13/22 0414 07/13/22 0649 07/13/22 1634 07/13/22 1636 07/13/22 2207  HGB 14.0   < > 12.6* 12.9*  --  10.8*  --   --   --   --   HCT 40.4   < > 37.0* 36.1*  --  30.2*  --   --   --   --   PLT 267  --   --  230  --  176  --   --   --   --   APTT  --   --   --   --   --   --   --  28  --  93*  LABPROT 15.5*  --   --   --   --   --   --  15.2  --   --   INR 1.2  --   --   --   --   --   --  1.2  --   --   HEPARINUNFRC  --   --   --   --   --   --   --   --  <0.10*  --   CREATININE  --    < >  --  5.27* 4.71*  --  4.13* 2.60*  --   --   TROPONINIHS 23*  --   --  19*  --   --   --   --   --   --    < > = values in this interval not displayed.    Estimated Creatinine Clearance: 51.1 mL/min (A) (by C-G formula based on SCr of 2.6 mg/dL (H)).  Medical History: Past Medical History:  Diagnosis Date   ETOH abuse    Hypertension    Medications:  Scheduled:   Chlorhexidine Gluconate Cloth  6 each Topical Daily   folic acid  1 mg Intravenous Daily   insulin aspart  0-15 Units Subcutaneous Q4H   pantoprazole (PROTONIX) IV  40 mg Intravenous Q12H   PHENObarbital  97.5 mg Intravenous Q8H   Followed by   PHENObarbital  65 mg Intravenous Q8H   Followed by   Derrill Memo ON 07/16/2022] PHENObarbital  32.5 mg Intravenous Q8H   thiamine (VITAMIN B1) injection  100 mg Intravenous Daily   vancomycin variable dose per unstable renal function (pharmacist dosing)   Does not apply See admin instructions   Assessment: DM is a 32 yo male who presented to the ED on 1/7 with  nausea and vomiting. PMH includes PE diagnosed December 2023. Patient was on apixaban prior to admission and this was continued inpatient. Patient was started on apixaban starter pack on  12/20. Patient reports the last dose of apixaban taken outpatient was about 5 days prior to admission. Patient received one dose of apixaban inpatient on 1/8 at 0951. Per RN, patient vomited shortly after dose was given.  Initial aptt therapeutic 93 seconds on 1800 units/hr. Patient pulled out A-line around midnight with significant bleeding reported by RN. She was able to get bleeding to stop with pressure. CBC ordered with  next lab collection.  Goal of Therapy:  Heparin level 0.3-0.7 units/ml aPTT 66-102 seconds Monitor platelets by anticoagulation protocol: Yes   Plan:  Continue heparin infusion at 1800 units/hr Check baseline aPTT and PT-INR Check anti-Xa level and aPTT in 6 hours and daily while on heparin Continue to monitor H&H and platelets   Ruben Im, PharmD Clinical Pharmacist 07/14/2022 12:37 AM Please check AMION for all [facility] Pharmacy numbers

## 2022-07-14 NOTE — Procedures (Signed)
Arterial Catheter Insertion Procedure Note  Tristram Milian Jantz  [id number]  1991-02-08  Date:07/14/22  Time:12:51 AM    Provider Performing: Ulice Dash    Procedure: Insertion of Arterial Line [phone]) without US guidance  Indication(s) Blood pressure monitoring and/or need for frequent ABGs  Consent Risks of the procedure as well as the alternatives and risks of each were explained to the patient and/or caregiver.  Consent for the procedure was obtained and is signed in the bedside chart  Anesthesia None   Time Out Verified patient identification, verified procedure, site/side was marked, verified correct patient position, special equipment/implants available, medications/allergies/relevant history reviewed, required imaging and test results available.   Sterile Technique Maximal sterile technique including full sterile barrier drape, hand hygiene, sterile gown, sterile gloves, mask, hair covering, sterile ultrasound probe cover (if used).   Procedure Description Area of catheter insertion was cleaned with chlorhexidine and draped in sterile fashion. With real-time ultrasound guidance an arterial catheter was placed into the right radial artery.  Appropriate arterial tracings confirmed on monitor.     Complications/Tolerance None; patient tolerated the procedure well.   EBL Minimal   Specimen(s) None

## 2022-07-14 NOTE — Progress Notes (Signed)
ANTICOAGULATION CONSULT NOTE   Pharmacy Consult for heparin Indication: history of PE  No Known Allergies  Patient Measurements: Weight: 97 kg (213 lb 13.5 oz) Heparin Dosing Weight: TBW  Vital Signs: Temp: 100 F (37.8 C) (01/09 0630) Temp Source: Bladder (01/09 0200) BP: 141/79 (01/09 0600) Pulse Rate: 101 (01/09 0630)  Labs: Recent Labs    07/12/22 1432 07/12/22 1441 07/12/22 1621 07/13/22 0048 07/13/22 0414 07/13/22 0649 07/13/22 1634 07/13/22 1636 07/13/22 2207 07/14/22 0440  HGB 14.0   < > 12.9*  --  10.8*  --   --   --   --  10.2*  HCT 40.4   < > 36.1*  --  30.2*  --   --   --   --  29.3*  PLT 267  --  230  --  176  --   --   --   --  174  APTT  --   --   --   --   --   --  28  --  93* 87*  LABPROT 15.5*  --   --   --   --   --  15.2  --   --   --   INR 1.2  --   --   --   --   --  1.2  --   --   --   HEPARINUNFRC  --   --   --   --   --   --   --  <0.10* 0.57 0.57  CREATININE  --    < > 5.27*   < >  --  4.13* 2.60*  --   --  1.71*  TROPONINIHS 23*  --  19*  --   --   --   --   --   --   --    < > = values in this interval not displayed.    Estimated Creatinine Clearance: 71.4 mL/min (A) (by C-G formula based on SCr of 1.71 mg/dL (H)).  Medical History: Past Medical History:  Diagnosis Date   ETOH abuse    Hypertension    Medications:  Scheduled:   Chlorhexidine Gluconate Cloth  6 each Topical Daily   folic acid  1 mg Intravenous Daily   insulin aspart  0-15 Units Subcutaneous Q4H   pantoprazole (PROTONIX) IV  40 mg Intravenous Q12H   PHENObarbital  97.5 mg Intravenous Q8H   Followed by   PHENObarbital  65 mg Intravenous Q8H   Followed by   Derrill Memo ON 07/16/2022] PHENObarbital  32.5 mg Intravenous Q8H   thiamine (VITAMIN B1) injection  100 mg Intravenous Daily   vancomycin variable dose per unstable renal function (pharmacist dosing)   Does not apply See admin instructions   Assessment: DM is a 32 yo male who presented to the ED on 1/7 with  nausea and vomiting. PMH includes PE diagnosed December 2023. Patient was on apixaban prior to admission and this was continued inpatient. Patient was started on apixaban starter pack on  12/20. Patient reports the last dose of apixaban taken outpatient was about 5 days prior to admission. Patient received one dose of apixaban inpatient on 1/8 at 0951. Per RN, patient vomited shortly after dose was given. Hgb 10.2, plt 174 slowly trending down in setting of patient pulling out A-line around midnight with significant bleeding reported by RN. No line issues or signs/symptoms of bleeding this morning since A-line was replaced per RN. Heparin level today is  therapeutic at 0.57, on 1800 units/hr.   Goal of Therapy:  Heparin level 0.3-0.7 units/ml aPTT 66-102 seconds Monitor platelets by anticoagulation protocol: Yes   Plan:  Continue heparin infusion at 1800 units/hr Check anti-Xa level daily while on heparin Continue to monitor H&H and platelets  Georga Hacking, Pharm.D PGY1 Pharmacy Resident 07/14/2022 6:55 AM

## 2022-07-14 NOTE — Progress Notes (Signed)
eLink Physician-Brief Progress Note Patient Name: Brett Molina DOB: 06-22-1991 MRN: [id number]   Date of Service  07/14/2022  HPI/Events of Note  K 3 Creatinine 1.71 from 2.6 Na 126 from 124 IVF with K incorporation  eICU Interventions  Ordered potassium 10 meqs IV x 4 doses     Intervention Category Intermediate Interventions: Electrolyte abnormality - evaluation and management  Judd Lien 07/14/2022, 5:45 AM

## 2022-07-15 DIAGNOSIS — R569 Unspecified convulsions: Secondary | ICD-10-CM

## 2022-07-15 DIAGNOSIS — E871 Hypo-osmolality and hyponatremia: Secondary | ICD-10-CM

## 2022-07-15 DIAGNOSIS — N179 Acute kidney failure, unspecified: Secondary | ICD-10-CM

## 2022-07-15 LAB — CBC
HCT: 28.7 % — ABNORMAL LOW (ref 39.0–52.0)
Hemoglobin: 9.9 g/dL — ABNORMAL LOW (ref 13.0–17.0)
MCH: 32.9 pg (ref 26.0–34.0)
MCHC: 34.5 g/dL (ref 30.0–36.0)
MCV: 95.3 fL (ref 80.0–100.0)
Platelets: 194 10*3/uL (ref 150–400)
RBC: 3.01 MIL/uL — ABNORMAL LOW (ref 4.22–5.81)
RDW: 12 % (ref 11.5–15.5)
WBC: 15 10*3/uL — ABNORMAL HIGH (ref 4.0–10.5)
nRBC: 0 % (ref 0.0–0.2)

## 2022-07-15 LAB — GLUCOSE, CAPILLARY
Glucose-Capillary: 102 mg/dL — ABNORMAL HIGH (ref 70–99)
Glucose-Capillary: 105 mg/dL — ABNORMAL HIGH (ref 70–99)
Glucose-Capillary: 122 mg/dL — ABNORMAL HIGH (ref 70–99)
Glucose-Capillary: 145 mg/dL — ABNORMAL HIGH (ref 70–99)
Glucose-Capillary: 93 mg/dL (ref 70–99)
Glucose-Capillary: 93 mg/dL (ref 70–99)

## 2022-07-15 LAB — BASIC METABOLIC PANEL
Anion gap: 12 (ref 5–15)
BUN: 13 mg/dL (ref 6–20)
CO2: 27 mmol/L (ref 22–32)
Calcium: 8.5 mg/dL — ABNORMAL LOW (ref 8.9–10.3)
Chloride: 92 mmol/L — ABNORMAL LOW (ref 98–111)
Creatinine, Ser: 0.88 mg/dL (ref 0.61–1.24)
GFR, Estimated: >60 mL/min (ref >60)
Glucose, Bld: 93 mg/dL (ref 70–99)
Potassium: 3.6 mmol/L (ref 3.5–5.1)
Sodium: 131 mmol/L — ABNORMAL LOW (ref 135–145)

## 2022-07-15 MED ORDER — INSULIN ASPART 100 UNIT/ML IJ SOLN
0.0000 [IU] | Freq: Three times a day (TID) | INTRAMUSCULAR | Status: DC
  Administered 2022-07-15: 2 [IU] via SUBCUTANEOUS
  Administered 2022-07-16: 3 [IU] via SUBCUTANEOUS

## 2022-07-15 MED ORDER — DOXYCYCLINE HYCLATE 100 MG PO TABS
100.0000 mg | ORAL_TABLET | Freq: Two times a day (BID) | ORAL | Status: DC
  Administered 2022-07-15 – 2022-07-18 (×7): 100 mg via ORAL
  Filled 2022-07-15 (×7): qty 1

## 2022-07-15 NOTE — Progress Notes (Signed)
PROGRESS NOTE    Brett Molina  KXF:[id number] DOB: March 03, 1991 DOA: 07/12/2022 PCP: Pcp, No    Chief Complaint  Patient presents with   Seizures    Aspiration, ETOH    Brief Narrative:   Brett Molina is a 32 year old male with a past medical history significant for EtOH abuse and hypertension who presented to the emergency department after vomiting at home reports of binge drinking few days prior.  Per EMS patient has seizure while vomiting with high concern for aspiration prior to ED arrival, 5 mg Versed given during transport.  Patient had witnessed tonic-clonic seizure in ED at which time access was placed due to concern for needing intubation.   Patient presented with hypotension and mild tachycardia.  Lab work pending due to faculty obtaining access sites.  I-STAT chemistry and CBC with sodium 111, glucose 235, creatinine 6.30, ionized calcium 0.75, troponin 23, WBC 33.3, lactic acid greater than 9.  PCCM consulted for further management and admission.    Assessment & Plan:   Principal Problem:   Seizure (HCC) Active Problems:   Hyperphosphatemia   Acute metabolic encephalopathy secondary to alcohol withdrawals related seizures  -  reports of binge drinking for a few days PTA with complete cessation of ETOH. Unknown -Continue with seizures precaution - Cont Keppra > changing dosing in regards to renal function  - Cont thiamine and folate   - Liberalize Diet >- Ensure adequate nutrition and bowel regimen  - Cont Phenergan    Likely evolving sepsis due to underlying aspiration pneumonia as well due to infected wounds in the toes bilaterally - On presentation patient had mild hypotension, tachycardia, and elevated lactic greater than 9. CXR not showing signs of Asp PNA  -on  vancomycin and Rocephin, with Rocephin, will add doxycycline to cover for wounds at  - Blood Cx > NGTD - Resp Panel Neg - Ensure adequate renal perfusion  - Cont Vanco (Start 1/7 - Stop  1/9)  Ceftriaxone (Started 1/8) x5days  EtOH abuse - Seizure precautions  - CIWA protocol - Phenobarbital Taper - Monitor signs for withdrawal    Severe acute kidney injury with AGMA Hypokalemia/Hyperphosphatemia  -Improving, continue to replete as needed.  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 gtt>> bacl on Eliquis   Hypertension  Plan  - Continue Labetalol PRN IV - Plan to add Metoprolol Po BID when tolerating PO   DVT prophylaxis: Eliquis Code Status: Full Family Communication: None at bedside Disposition:   Status is: Inpatient  Consultants:  PCCM  Subjective:  No significant Events overnight, he denies any complaints today  Objective: Vitals:   07/15/22 0311 07/15/22 0400 07/15/22 0808 07/15/22 0910  BP: (!) 115/58 115/69 123/62 125/68  Pulse: 98 90 (!) 109   Resp: 16 18 18    Temp: 99 F (37.2 C)  98.8 F (37.1 C)   TempSrc: Axillary  Oral   SpO2: 97% 93% 96%   Weight:        Intake/Output Summary (Last 24 hours) at 07/15/2022 1053 Last data filed at 07/15/2022 0811 Gross per 24 hour  Intake 1089 ml  Output 2825 ml  Net -1736 ml   Filed Weights   07/13/22 0500 07/14/22 0400  Weight: 100.1 kg 97 kg    Examination:  Awake Alert, Oriented X 3, No new F.N deficits, Normal  affect Symmetrical Chest wall movement, Good air movement bilaterally, CTAB RRR,No Gallops,Rubs or new Murmurs, No Parasternal Heave +ve B.Sounds, Abd Soft, No tenderness, No rebound - guarding or rigidity. No Cyanosis, Clubbing no significant bilateral/digit wounds, please see picture below           Data Reviewed: I have personally reviewed following labs and imaging studies  CBC: Recent Labs  Lab 07/12/22 1432 07/12/22 1441 07/12/22 1509 07/12/22 1621 07/13/22 0414 07/14/22 0440 07/15/22 0642  WBC 33.5*   --   --  30.3* 24.9* 20.4* 15.0*  NEUTROABS 29.4*  --   --   --  21.9*  --   --   HGB 14.0   < > 12.6* 12.9* 10.8* 10.2* 9.9*  HCT 40.4   < > 37.0* 36.1* 30.2* 29.3* 28.7*  MCV 94.8  --   --  92.1 91.8 94.5 95.3  PLT 267  --   --  230 176 174 194   < > = values in this interval not displayed.    Basic Metabolic Panel: Recent Labs  Lab 07/12/22 1621 07/12/22 2002 07/13/22 0048 07/13/22 0144 07/13/22 0414 07/13/22 0649 07/13/22 1139 07/13/22 1634 07/14/22 0110 07/14/22 0440 07/14/22 0842 07/14/22 1400 07/14/22 1802 07/14/22 2141 07/15/22 0642  NA 121*   < > 120*  --   --  120*   < > 124*  124*   < > 126* 128* 126* 128* 128* 131*  K 3.0*  --  2.8* 2.7*  --  2.9*  --  2.7*  --  3.0*  --   --   --   --  3.6  CL 66*  --  74*  --   --  75*  --  81*  --  84*  --   --   --   --  92*  CO2 23  --  26  --   --  25  --  25  --  25  --   --   --   --  27  GLUCOSE 86  --  120*  --   --  125*  --  102*  --  87  --   --   --   --  93  BUN 32*  --  31*  --   --  33*  --  33*  --  32*  --   --   --   --  13  CREATININE 5.27*  --  4.71*  --   --  4.13*  --  2.60*  --  1.71*  --   --   --   --  0.88  CALCIUM 8.0*  --  7.6*  --   --  7.8*  --  8.0*  --  8.1*  --   --   --   --  8.5*  MG 3.5*  --   --   --  2.5*  --   --   --   --   --   --   --   --   --   --   PHOS 9.3*  --   --   --  7.5*  --   --   --   --   --   --   --   --   --   --    < > = values in this interval not displayed.    GFR: Estimated Creatinine Clearance: 138.8 mL/min (by C-G formula  based on SCr of 0.88 mg/dL).  Liver Function Tests: Recent Labs  Lab 07/12/22 1621  AST 99*  ALT 24  ALKPHOS 70  BILITOT 1.1  PROT 6.2*  ALBUMIN 3.1*    CBG: Recent Labs  Lab 07/14/22 1526 07/14/22 1922 07/15/22 0002 07/15/22 0324 07/15/22 0810  GLUCAP 133* 129* 105* 93 145*     Recent Results (from the past 240 hour(s))  Culture, blood (routine x 2)     Status: None (Preliminary result)   Collection Time: 07/12/22   2:26 PM   Specimen: BLOOD  Result Value Ref Range Status   Specimen Description BLOOD SITE NOT SPECIFIED  Final   Special Requests   Final    BOTTLES DRAWN AEROBIC AND ANAEROBIC Blood Culture adequate volume   Culture   Final    NO GROWTH 3 DAYS Performed at [facility], [place] [address] [place], [place] [address]    Report Status PENDING  Incomplete  Resp panel by RT-PCR (RSV, Flu A&B, Covid) Anterior Nasal Swab     Status: None   Collection Time: 07/12/22  2:30 PM   Specimen: Anterior Nasal Swab  Result Value Ref Range Status   SARS Coronavirus 2 by RT PCR NEGATIVE NEGATIVE Final    Comment: (NOTE) SARS-CoV-2 target nucleic acids are NOT DETECTED.  The SARS-CoV-2 RNA is generally detectable in upper respiratory specimens during the acute phase of infection. The lowest concentration of SARS-CoV-2 viral copies this assay can detect is 138 copies/mL. A negative result does not preclude SARS-Cov-2 infection and should not be used as the sole basis for treatment or other patient management decisions. A negative result may occur with  improper specimen collection/handling, submission of specimen other than nasopharyngeal swab, presence of viral mutation(s) within the areas targeted by this assay, and inadequate number of viral copies(<138 copies/mL). A negative result must be combined with clinical observations, patient history, and epidemiological information. The expected result is Negative.  Fact Sheet for Patients:  EntrepreneurPulse.com.au  Fact Sheet for Healthcare Providers:  IncredibleEmployment.be  This test is no t yet approved or cleared by the [place] FDA and  has been authorized for detection and/or diagnosis of SARS-CoV-2 by FDA under an Emergency Use Authorization (EUA). This EUA will remain  in effect (meaning this test can be used) for the duration of the COVID-19 declaration under Section 564(b)(1) of the Act,  21 U.S.C.section 360bbb-3(b)(1), unless the authorization is terminated  or revoked sooner.       Influenza A by PCR NEGATIVE NEGATIVE Final   Influenza B by PCR NEGATIVE NEGATIVE Final    Comment: (NOTE) The Xpert Xpress SARS-CoV-2/FLU/RSV plus assay is intended as an aid in the diagnosis of influenza from Nasopharyngeal swab specimens and should not be used as a sole basis for treatment. Nasal washings and aspirates are unacceptable for Xpert Xpress SARS-CoV-2/FLU/RSV testing.  Fact Sheet for Patients: EntrepreneurPulse.com.au  Fact Sheet for Healthcare Providers: IncredibleEmployment.be  This test is not yet approved or cleared by the [place] FDA and has been authorized for detection and/or diagnosis of SARS-CoV-2 by FDA under an Emergency Use Authorization (EUA). This EUA will remain in effect (meaning this test can be used) for the duration of the COVID-19 declaration under Section 564(b)(1) of the Act, 21 U.S.C. section 360bbb-3(b)(1), unless the authorization is terminated or revoked.     Resp Syncytial Virus by PCR NEGATIVE NEGATIVE Final    Comment: (NOTE) Fact Sheet for Patients: EntrepreneurPulse.com.au  Fact Sheet for  Healthcare Providers: IncredibleEmployment.be  This test is not yet approved or cleared by the [place] and has been authorized for detection and/or diagnosis of SARS-CoV-2 by FDA under an Emergency Use Authorization (EUA). This EUA will remain in effect (meaning this test can be used) for the duration of the COVID-19 declaration under Section 564(b)(1) of the Act, 21 U.S.C. section 360bbb-3(b)(1), unless the authorization is terminated or revoked.  Performed at [facility], [place] [address] [place], [place] [address]   Culture, blood (routine x 2)     Status: None (Preliminary result)   Collection Time: 07/12/22  2:31 PM   Specimen: BLOOD  Result  Value Ref Range Status   Specimen Description BLOOD BLOOD RIGHT HAND  Final   Special Requests AEROBIC BOTTLE ONLY Blood Culture adequate volume  Final   Culture   Final    NO GROWTH 3 DAYS Performed at [facility], [place] [address] [place], [place] [address]    Report Status PENDING  Incomplete  MRSA Next Gen by PCR, Nasal     Status: None   Collection Time: 07/12/22  5:49 PM   Specimen: Nasal Mucosa; Nasal Swab  Result Value Ref Range Status   MRSA by PCR Next Gen NOT DETECTED NOT DETECTED Final    Comment: (NOTE) The GeneXpert MRSA Assay (FDA approved for NASAL specimens only), is one component of a comprehensive MRSA colonization surveillance program. It is not intended to diagnose MRSA infection nor to guide or monitor treatment for MRSA infections. Test performance is not FDA approved in patients less than 37 years old. Performed at [facility], [place] [address], [place], [place] [address]   C Difficile Quick Screen w PCR reflex     Status: None   Collection Time: 07/14/22  2:41 PM  Result Value Ref Range Status   C Diff antigen NEGATIVE NEGATIVE Final   C Diff toxin NEGATIVE NEGATIVE Final   C Diff interpretation No C. difficile detected.  Final    Comment: Performed at [facility], [place] [address] [place], [place] [address]         Radiology Studies: CT SOFT TISSUE NECK WO CONTRAST  Result Date: 07/13/2022 CLINICAL DATA:  Odynophagia EXAM: CT NECK WITHOUT CONTRAST TECHNIQUE: Multidetector CT imaging of the neck was performed following the standard protocol without intravenous contrast. RADIATION DOSE REDUCTION: This exam was performed according to the departmental dose-optimization program which includes automated exposure control, adjustment of the mA and/or kV according to patient size and/or use of iterative reconstruction technique. COMPARISON:  None Available. FINDINGS: Pharynx and larynx: There is mucosal thickening in the right nasal cavity. The  left nasal cavity and nasopharynx are unremarkable. The oral cavity and oropharynx are unremarkable. The parapharyngeal spaces are clear. The hypopharynx and larynx are unremarkable. The vocal folds are normal in appearance. There is no retropharyngeal fluid collection. The airway is patent. Salivary glands: The parotid and submandibular glands are unremarkable. Thyroid: Unremarkable. Lymph nodes: There is no lymphadenopathy in the neck. Vascular: Unremarkable, as can be evaluated in the absence of intravenous contrast. Limited intracranial: Grossly unremarkable. Visualized orbits: Unremarkable. Mastoids and visualized paranasal sinuses: There is mild mucosal thickening in the ethmoid air cells and right maxillary sinus. The mastoid air cells and middle ear cavities are clear to the level imaged. Skeleton: There is no acute osseous abnormality or suspicious osseous lesion. Upper chest: The imaged lung apices are clear. The imaged upper esophagus is grossly  unremarkable. Other: None. IMPRESSION: Unremarkable noncontrast CT of the neck with no acute finding. Electronically Signed   By: Lesia Hausen M.D.   On: 07/13/2022 18:32        Scheduled Meds:  apixaban  5 mg Oral BID   folic acid  1 mg Intravenous Daily   insulin aspart  0-15 Units Subcutaneous TID WC   metoprolol tartrate  12.5 mg Oral BID   pantoprazole (PROTONIX) IV  40 mg Intravenous Q24H   PHENObarbital  65 mg Intravenous Q8H   Followed by   Melene Muller ON 07/16/2022] PHENObarbital  32.5 mg Intravenous Q8H   thiamine (VITAMIN B1) injection  100 mg Intravenous Daily   Continuous Infusions:  sodium chloride     cefTRIAXone (ROCEPHIN)  IV Stopped (07/14/22 1624)   levETIRAcetam 1,000 mg (07/15/22 0257)   promethazine (PHENERGAN) injection (IM or IVPB)       LOS: 3 days        Huey Bienenstock, MD [facility]   To contact the attending provider between 7A-7P or the covering provider during after hours 7P-7A, please log into  the web site [email or web address] and access using universal [place] password for that web site. If you do not have the password, please call the hospital operator.  07/15/2022, 10:53 AM

## 2022-07-15 NOTE — Progress Notes (Signed)
Patient transferred from 2M08 to 5W10. Patient alert and oriented x 4. Oriented to room and surroundings. Bed alarm on and audible. Call bell within reach. Denies any complaints. Will continue to monitor.

## 2022-07-16 DIAGNOSIS — E871 Hypo-osmolality and hyponatremia: Secondary | ICD-10-CM

## 2022-07-16 DIAGNOSIS — R569 Unspecified convulsions: Secondary | ICD-10-CM

## 2022-07-16 DIAGNOSIS — L97501 Non-pressure chronic ulcer of other part of unspecified foot limited to breakdown of skin: Secondary | ICD-10-CM

## 2022-07-16 LAB — PHOSPHORUS: Phosphorus: 2.1 mg/dL — ABNORMAL LOW (ref 2.5–4.6)

## 2022-07-16 LAB — GLUCOSE, CAPILLARY
Glucose-Capillary: 183 mg/dL — ABNORMAL HIGH (ref 70–99)
Glucose-Capillary: 99 mg/dL (ref 70–99)
Glucose-Capillary: 90 mg/dL (ref 70–99)
Glucose-Capillary: 121 mg/dL — ABNORMAL HIGH (ref 70–99)

## 2022-07-16 LAB — BASIC METABOLIC PANEL
Anion gap: 11 (ref 5–15)
BUN: 8 mg/dL (ref 6–20)
CO2: 25 mmol/L (ref 22–32)
Calcium: 8.3 mg/dL — ABNORMAL LOW (ref 8.9–10.3)
Chloride: 100 mmol/L (ref 98–111)
Creatinine, Ser: 0.74 mg/dL (ref 0.61–1.24)
GFR, Estimated: >60 mL/min (ref >60)
Glucose, Bld: 95 mg/dL (ref 70–99)
Potassium: 3.2 mmol/L — ABNORMAL LOW (ref 3.5–5.1)
Sodium: 136 mmol/L (ref 135–145)

## 2022-07-16 LAB — CBC
HCT: 29.2 % — ABNORMAL LOW (ref 39.0–52.0)
Hemoglobin: 9.5 g/dL — ABNORMAL LOW (ref 13.0–17.0)
MCH: 32.4 pg (ref 26.0–34.0)
MCHC: 32.5 g/dL (ref 30.0–36.0)
MCV: 99.7 fL (ref 80.0–100.0)
Platelets: 221 10*3/uL (ref 150–400)
RBC: 2.93 MIL/uL — ABNORMAL LOW (ref 4.22–5.81)
RDW: 12.2 % (ref 11.5–15.5)
WBC: 10 10*3/uL (ref 4.0–10.5)
nRBC: 0 % (ref 0.0–0.2)

## 2022-07-16 LAB — MAGNESIUM: Magnesium: 1.4 mg/dL — ABNORMAL LOW (ref 1.7–2.4)

## 2022-07-16 MED ORDER — PHENOBARBITAL SODIUM 65 MG/ML IJ SOLN
32.5000 mg | Freq: Three times a day (TID) | INTRAMUSCULAR | Status: DC
  Administered 2022-07-16 – 2022-07-18 (×5): 32.5 mg via INTRAVENOUS
  Filled 2022-07-16 (×4): qty 1

## 2022-07-16 MED ORDER — MAGNESIUM SULFATE 4 GM/100ML IV SOLN
4.0000 g | Freq: Once | INTRAVENOUS | Status: AC
  Administered 2022-07-16: 4 g via INTRAVENOUS
  Filled 2022-07-16: qty 100

## 2022-07-16 MED ORDER — POTASSIUM CHLORIDE CRYS ER 10 MEQ PO TBCR
30.0000 meq | EXTENDED_RELEASE_TABLET | Freq: Four times a day (QID) | ORAL | Status: AC
  Administered 2022-07-16 (×2): 30 meq via ORAL
  Filled 2022-07-16 (×2): qty 3

## 2022-07-16 MED ORDER — K PHOS MONO-SOD PHOS DI & MONO 155-852-130 MG PO TABS
500.0000 mg | ORAL_TABLET | Freq: Three times a day (TID) | ORAL | Status: AC
  Administered 2022-07-16 – 2022-07-17 (×4): 500 mg via ORAL
  Filled 2022-07-16 (×4): qty 2

## 2022-07-16 NOTE — Evaluation (Signed)
Occupational Therapy Evaluation Patient Details Name: Brett Molina MRN: [id number] DOB: 1990-08-12 Today's Date: 07/16/2022   History of Present Illness 32 year old male adm 07/12/22 with a past medical history significant for EtOH abuse and hypertension who presented to the emergency department after vomiting at home reports of binge drinking few days prior.  Patient had witnessed tonic-clonic seizure in ED.   Clinical Impression   Patient admitted for the diagnosis above.  PTA he was living alone in an apartment.  Needing no assist with ADL and mobility.  Unclear what social supports he has in the community.  Other than discomfort and wounds to his feet, he is at or near his baseline.  The post op shoes ordered are not in the room as of yet.  No significant OT needs exist in the acute setting.        Recommendations for follow up therapy are one component of a multi-disciplinary discharge planning process, led by the attending physician.  Recommendations may be updated based on patient status, additional functional criteria and insurance authorization.   Follow Up Recommendations  No OT follow up     Assistance Recommended at Discharge PRN  Patient can return home with the following Assist for transportation    Functional Status Assessment  Patient has not had a recent decline in their functional status  Equipment Recommendations  None recommended by OT    Recommendations for Other Services       Precautions / Restrictions Precautions Precautions: Fall Precaution Comments: Wounds to B feet Restrictions Weight Bearing Restrictions: No Other Position/Activity Restrictions: post op shoe not in room yet      Mobility Bed Mobility Overal bed mobility: Modified Independent                  Transfers Overall transfer level: Independent Equipment used: None               General transfer comment: walking in the room ad lib      Balance Overall balance  assessment: No apparent balance deficits (not formally assessed)                                         ADL either performed or assessed with clinical judgement   ADL Overall ADL's : Modified independent                                             Vision   Vision Assessment?: No apparent visual deficits     Perception     Praxis      Pertinent Vitals/Pain Pain Assessment Pain Score: 2  Pain Location: bil feet Pain Descriptors / Indicators: Discomfort Pain Intervention(s): Monitored during session     Hand Dominance Right   Extremity/Trunk Assessment Upper Extremity Assessment Upper Extremity Assessment: Overall WFL for tasks assessed   Lower Extremity Assessment Lower Extremity Assessment: Defer to PT evaluation   Cervical / Trunk Assessment Cervical / Trunk Assessment: Normal   Communication Communication Communication: No difficulties   Cognition Arousal/Alertness: Awake/alert Behavior During Therapy: WFL for tasks assessed/performed Overall Cognitive Status: Within Functional Limits for tasks assessed  General Comments   VSS on RA    Exercises     Shoulder Instructions      Home Living Family/patient expects to be discharged to:: Private residence Living Arrangements: Alone   Type of Home: Apartment Home Access: Stairs to enter [employer] of Steps: 2 flights, 5 or 6 each flight   Home Layout: One level     Bathroom Shower/Tub: Teacher, early years/pre: Handicapped height Bathroom Accessibility: Yes How Accessible: Accessible via walker Home Equipment: None   Additional Comments: spouse left the apartment, so he plans on returning there      Prior Functioning/Environment Prior Level of Function : Independent/Modified Independent                        OT Problem List: Pain      OT Treatment/Interventions:      OT  Goals(Current goals can be found in the care plan section) Acute Rehab OT Goals Patient Stated Goal: Return home OT Goal Formulation: With patient Time For Goal Achievement: 07/30/22 Potential to Achieve Goals: Good  OT Frequency:      Co-evaluation              AM-PAC OT "6 Clicks" Daily Activity     Outcome Measure Help from another person eating meals?: None Help from another person taking care of personal grooming?: None Help from another person toileting, which includes using toliet, bedpan, or urinal?: None Help from another person bathing (including washing, rinsing, drying)?: None Help from another person to put on and taking off regular upper body clothing?: None Help from another person to put on and taking off regular lower body clothing?: None 6 Click Score: 24   End of Session Nurse Communication: Mobility status  Activity Tolerance: Patient tolerated treatment well Patient left: with call bell/phone within reach;in chair  OT Visit Diagnosis: Other (comment);Other symptoms and signs involving cognitive function (seizure)                Time: 1421-1440 OT Time Calculation (min): 19 min Charges:  OT General Charges $OT Visit: 1 Visit OT Evaluation $OT Eval Moderate Complexity: 1 Mod  07/16/2022  RP, OTR/L  Acute Rehabilitation Services  Office:  [phone]   Metta Clines 07/16/2022, 3:35 PM

## 2022-07-16 NOTE — Progress Notes (Signed)
Couldnot give the evening dose 32.5 mg of phenobarbital as the IV was occluded.So the whole vial 65mg  was wasted.Requested pharmacy to reschedule it.Handoff given to night shift RN

## 2022-07-16 NOTE — Consult Note (Signed)
Reason for Consult: Ulceration bilateral toes Referring Physician: Dr. Loyal Jacobson Molina is an 32 y.o. male.  HPI: He says the blisters began a few weeks ago when he walked a long distance and dress shoes.  Not particularly painful  Past Medical History:  Diagnosis Date   ETOH abuse    Hypertension     Past Surgical History:  Procedure Laterality Date   ESOPHAGOGASTRODUODENOSCOPY N/A 07/24/2018   Procedure: ESOPHAGOGASTRODUODENOSCOPY (EGD);  Surgeon: Milus Banister, MD;  Location: [facility];  Service: Endoscopy;  Laterality: N/A;    Family History  Problem Relation Age of Onset   Hypertension Mother    Hypertension Father     Social History:  reports that he has never smoked. He has never used smokeless tobacco. He reports current alcohol use of about 21.0 standard drinks of alcohol per week. He reports that he does not use drugs.  Allergies: No Known Allergies  Medications: I have reviewed the patient's current medications.  Results for orders placed or performed during the hospital encounter of 07/12/22 (from the past 48 hour(s))  Sodium     Status: Abnormal   Collection Time: 07/14/22  2:00 PM  Result Value Ref Range   Sodium 126 (L) 135 - 145 mmol/L    Comment: Performed at [facility], [place] [address], [place], [place] [address]  C Difficile Quick Screen w PCR reflex     Status: None   Collection Time: 07/14/22  2:41 PM  Result Value Ref Range   C Diff antigen NEGATIVE NEGATIVE   C Diff toxin NEGATIVE NEGATIVE   C Diff interpretation No C. difficile detected.     Comment: Performed at [facility], [place] [address] [place], [place] [address]  Glucose, capillary     Status: Abnormal   Collection Time: 07/14/22  3:26 PM  Result Value Ref Range   Glucose-Capillary 133 (H) 70 - 99 mg/dL    Comment: Glucose reference range applies only to samples taken after fasting for at least 8 hours.  Sodium     Status: Abnormal   Collection Time:  07/14/22  6:02 PM  Result Value Ref Range   Sodium 128 (L) 135 - 145 mmol/L    Comment: Performed at [facility], [place] [address] [place], [place] [address]  Glucose, capillary     Status: Abnormal   Collection Time: 07/14/22  7:22 PM  Result Value Ref Range   Glucose-Capillary 129 (H) 70 - 99 mg/dL    Comment: Glucose reference range applies only to samples taken after fasting for at least 8 hours.  Sodium     Status: Abnormal   Collection Time: 07/14/22  9:41 PM  Result Value Ref Range   Sodium 128 (L) 135 - 145 mmol/L    Comment: Performed at [facility], [place] [address] [place], [place] [address]  Glucose, capillary     Status: Abnormal   Collection Time: 07/15/22 12:02 AM  Result Value Ref Range   Glucose-Capillary 105 (H) 70 - 99 mg/dL    Comment: Glucose reference range applies only to samples taken after fasting for at least 8 hours.  Glucose, capillary     Status: None   Collection Time: 07/15/22  3:24 AM  Result Value Ref Range   Glucose-Capillary 93 70 - 99 mg/dL    Comment: Glucose reference range applies only to samples taken after fasting for at least 8 hours.  CBC  Status: Abnormal   Collection Time: 07/15/22  6:42 AM  Result Value Ref Range   WBC 15.0 (H) 4.0 - 10.5 K/uL   RBC 3.01 (L) 4.22 - 5.81 MIL/uL   Hemoglobin 9.9 (L) 13.0 - 17.0 g/dL   HCT 28.7 (L) 39.0 - 52.0 %   MCV 95.3 80.0 - 100.0 fL   MCH 32.9 26.0 - 34.0 pg   MCHC 34.5 30.0 - 36.0 g/dL   RDW 12.0 11.5 - 15.5 %   Platelets 194 150 - 400 K/uL   nRBC 0.0 0.0 - 0.2 %    Comment: Performed at [facility], [place] [address], [place], [place] [id number]  Basic metabolic panel     Status: Abnormal   Collection Time: 07/15/22  6:42 AM  Result Value Ref Range   Sodium 131 (L) 135 - 145 mmol/L   Potassium 3.6 3.5 - 5.1 mmol/L   Chloride 92 (L) 98 - 111 mmol/L   CO2 27 22 - 32 mmol/L   Glucose, Bld 93 70 - 99 mg/dL    Comment: Glucose reference range applies only to samples taken  after fasting for at least 8 hours.   BUN 13 6 - 20 mg/dL   Creatinine, Ser 0.88 0.61 - 1.24 mg/dL   Calcium 8.5 (L) 8.9 - 10.3 mg/dL   GFR, Estimated >60 >60 mL/min    Comment: (NOTE) Calculated using the CKD-EPI Creatinine Equation (2021)    Anion gap 12 5 - 15    Comment: Performed at [facility] [address] [place], [place] [address]  Glucose, capillary     Status: Abnormal   Collection Time: 07/15/22  8:10 AM  Result Value Ref Range   Glucose-Capillary 145 (H) 70 - 99 mg/dL    Comment: Glucose reference range applies only to samples taken after fasting for at least 8 hours.  Glucose, capillary     Status: None   Collection Time: 07/15/22 12:10 PM  Result Value Ref Range   Glucose-Capillary 93 70 - 99 mg/dL    Comment: Glucose reference range applies only to samples taken after fasting for at least 8 hours.  Glucose, capillary     Status: Abnormal   Collection Time: 07/15/22  5:26 PM  Result Value Ref Range   Glucose-Capillary 102 (H) 70 - 99 mg/dL    Comment: Glucose reference range applies only to samples taken after fasting for at least 8 hours.  Glucose, capillary     Status: Abnormal   Collection Time: 07/15/22  9:02 PM  Result Value Ref Range   Glucose-Capillary 122 (H) 70 - 99 mg/dL    Comment: Glucose reference range applies only to samples taken after fasting for at least 8 hours.  CBC     Status: Abnormal   Collection Time: 07/16/22  4:51 AM  Result Value Ref Range   WBC 10.0 4.0 - 10.5 K/uL   RBC 2.93 (L) 4.22 - 5.81 MIL/uL   Hemoglobin 9.5 (L) 13.0 - 17.0 g/dL   HCT 29.2 (L) 39.0 - 52.0 %   MCV 99.7 80.0 - 100.0 fL   MCH 32.4 26.0 - 34.0 pg   MCHC 32.5 30.0 - 36.0 g/dL   RDW 12.2 11.5 - 15.5 %   Platelets 221 150 - 400 K/uL   nRBC 0.0 0.0 - 0.2 %    Comment: Performed at [facility], [place] [address], [place],  [id number]  Basic metabolic panel  Status: Abnormal   Collection Time: 07/16/22  4:51 AM  Result Value Ref Range    Sodium 136 135 - 145 mmol/L   Potassium 3.2 (L) 3.5 - 5.1 mmol/L   Chloride 100 98 - 111 mmol/L   CO2 25 22 - 32 mmol/L   Glucose, Bld 95 70 - 99 mg/dL    Comment: Glucose reference range applies only to samples taken after fasting for at least 8 hours.   BUN 8 6 - 20 mg/dL   Creatinine, Ser 0.74 0.61 - 1.24 mg/dL   Calcium 8.3 (L) 8.9 - 10.3 mg/dL   GFR, Estimated >60 >60 mL/min    Comment: (NOTE) Calculated using the CKD-EPI Creatinine Equation (2021)    Anion gap 11 5 - 15    Comment: Performed at [facility] [address], [place], [place] [address]  Magnesium     Status: Abnormal   Collection Time: 07/16/22  4:51 AM  Result Value Ref Range   Magnesium 1.4 (L) 1.7 - 2.4 mg/dL    Comment: Performed at [facility] [address] [place], [place] [address]  Phosphorus     Status: Abnormal   Collection Time: 07/16/22  4:51 AM  Result Value Ref Range   Phosphorus 2.1 (L) 2.5 - 4.6 mg/dL    Comment: Performed at [facility] [address], [place], [place] [address]  Glucose, capillary     Status: Abnormal   Collection Time: 07/16/22  8:48 AM  Result Value Ref Range   Glucose-Capillary 183 (H) 70 - 99 mg/dL    Comment: Glucose reference range applies only to samples taken after fasting for at least 8 hours.  Glucose, capillary     Status: None   Collection Time: 07/16/22 12:32 PM  Result Value Ref Range   Glucose-Capillary 99 70 - 99 mg/dL    Comment: Glucose reference range applies only to samples taken after fasting for at least 8 hours.    No results found.  ROS Blood pressure 133/74, pulse 95, temperature 99.3 F (37.4 C), temperature source Oral, resp. rate 18, weight 97 kg, SpO2 96 %.  Vitals:   07/16/22 0800 07/16/22 1222  BP: 133/74   Pulse: 95   Resp: 18 18  Temp: 99.9 F (37.7 C) 99.3 F (37.4 C)  SpO2:      General AA&O x3. Normal mood and affect.  Vascular Dorsalis pedis and posterior tibial pulses  present 2+ bilaterally   Capillary refill normal to all digits. Pedal hair growth normal.  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  Dermatologic (Wound) Ulcerations on multiple digits of both feet with serous drainage no cellulitis no purulence no exposed bone tendon or joint fibrogranular wound beds  Orthopedic: Motor intact BLE.    Assessment/Plan:  Bilateral toe ulcerations -Imaging: Studies independently reviewed.  Do not see indication for advanced imaging -Do not see any evidence of acute infection -Antibiotics: Would recommend a total of 2 weeks antibiotics to cover for gram-positive bacteria such as cephalexin or Augmentin -WB Status: WBAT in postop shoes, ordered -Wound Care: Continue dressing changes daily per wound care nurse recommendations, continue this through discharge, if unable to get Xeroform at discharge can prescribe mupirocin ointment to change daily -Surgical Plan: We have no surgical plans -Can follow-up with our clinic after discharge or the wound care center -We will sign off at this point.  Please call if questions  Brett Molina  R Brett Molina 07/16/2022, 12:42 PM   Best available via secure chat for questions or concerns.

## 2022-07-16 NOTE — Progress Notes (Signed)
Orthopedic Tech Progress Note Patient Details:  Wynne Jury [facility] 11-15-1990 [id number]  Two PO shoes placed at bedside for use when OOB. Pt is aware of how to apply/adjust/remove. All needs met at this time, call bell is within reach.  Ortho Devices Type of Ortho Device: Postop shoe/boot Ortho Device/Splint Location: BLE Ortho Device/Splint Interventions: Ordered   Post Interventions Instructions Provided: Care of device, Adjustment of device  Lynell Greenhouse Jeri Modena 07/16/2022, 7:30 PM

## 2022-07-16 NOTE — Progress Notes (Addendum)
PROGRESS NOTE    Brett Molina  FAO:[id number] DOB: January 03, 1991 DOA: 07/12/2022 PCP: Pcp, No    Chief Complaint  Patient presents with   Seizures    Aspiration, ETOH    Brief Narrative:   Brett Molina is a 32 year old male with a past medical history significant for EtOH abuse and hypertension who presented to the emergency department after vomiting at home reports of binge drinking few days prior.  Per EMS patient has seizure while vomiting with high concern for aspiration prior to ED arrival, 5 mg Versed given during transport.  Patient had witnessed tonic-clonic seizure in ED at which time access was placed due to concern for needing intubation.   Patient presented with hypotension and mild tachycardia.  Lab work pending due to faculty obtaining access sites.  I-STAT chemistry and CBC with sodium 111, glucose 235, creatinine 6.30, ionized calcium 0.75, troponin 23, WBC 33.3, lactic acid greater than 9.  Admitted initially to ICU, he was transferred to [facility] on 07/15/2022. Assessment & Plan:   Principal Problem:   Seizure ([place]) Active Problems:   Hyperphosphatemia   Skin ulcer of great toe, limited to breakdown of skin ([place])   Acute metabolic encephalopathy secondary to alcohol withdrawals related seizures  -  reports of binge drinking for a few days PTA with complete cessation of ETOH. Unknown -Continue with seizures precaution - Cont Keppra > changing dosing in regards to renal function  - Cont thiamine and folate   - Liberalize Diet >- Ensure adequate nutrition and bowel regimen  - Cont Phenergan    Likely evolving sepsis due to underlying aspiration pneumonia as well due to infected wounds in the toes bilaterally - On presentation patient had mild hypotension, tachycardia, and elevated lactic greater than 9. CXR not showing signs of Asp PNA  -Currently on vancomycin and Rocephin, currently narrowed to Rocephin, to finish total 5 days Rocephin for  pneumonia, did add doxycycline for his lower extremity wounds, he can be transition to Augmentin on discharge.  - Blood Cx > NGTD - Resp Panel Neg - Ensure adequate renal perfusion  - Cont Vanco (Start 1/7 - Stop 1/9)  Ceftriaxone (Started 1/8) x5days  EtOH abuse - Seizure precautions  - CIWA protocol - Phenobarbital Taper - Monitor signs for withdrawal    Severe acute kidney injury with AGMA Hypokalemia/Hyperphosphatemia  -Improving, continue to replete as needed.  Hyponatremai/ beer potomania -Improving  Hypokalemia Hypomagnesemia Hypophosphatemia -Repleted  Hyperglycemia - CBG monitoring   Multiple wounds to bilateral feet - Continue with wound care -Dietary consult greatly appreciated, continue with local wound care, no indication for surgical intervention, ambulate as tolerated.   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  Objective: Vitals:   07/15/22 2147 07/16/22 0411 07/16/22 0800 07/16/22 1222  BP:  135/86 133/74   Pulse: (!) 102 95 95   Resp:  14 18 18   Temp:  98.2 F (36.8 C) 99.9 F (37.7 C) 99.3 F (37.4 C)  TempSrc:  Oral  Oral  SpO2:      Weight:        Intake/Output Summary (Last 24 hours) at 07/16/2022  1307 Last data filed at 07/16/2022 1038 Gross per 24 hour  Intake --  Output 1150 ml  Net -1150 ml   Filed Weights   07/13/22 0500 07/14/22 0400  Weight: 100.1 kg 97 kg    Examination:  Awake Alert, Oriented X 3, No new F.N deficits, Normal affect Symmetrical Chest wall movement, Good air movement bilaterally, CTAB RRR,No Gallops,Rubs or new Murmurs, No Parasternal Heave +ve B.Sounds, Abd Soft, No  tenderness, No rebound - guarding or rigidity. No Cyanosis, Clubbing, bilateral feet bandaged   Data Reviewed: I have personally reviewed following labs and imaging studies  CBC: Recent Labs  Lab 07/12/22 1432 07/12/22 1441 07/12/22 1621 07/13/22 0414 07/14/22 0440 07/15/22 0642 07/16/22 0451  WBC 33.5*  --  30.3* 24.9* 20.4* 15.0* 10.0  NEUTROABS 29.4*  --   --  21.9*  --   --   --   HGB 14.0   < > 12.9* 10.8* 10.2* 9.9* 9.5*  HCT 40.4   < > 36.1* 30.2* 29.3* 28.7* 29.2*  MCV 94.8  --  92.1 91.8 94.5 95.3 99.7  PLT 267  --  230 176 174 194 221   < > = values in this interval not displayed.    Basic Metabolic Panel: Recent Labs  Lab 07/12/22 1621 07/12/22 2002 07/13/22 0414 07/13/22 0649 07/13/22 1139 07/13/22 1634 07/14/22 0110 07/14/22 0440 07/14/22 0842 07/14/22 1400 07/14/22 1802 07/14/22 2141 07/15/22 0642 07/16/22 0451  NA 121*   < >  --  120*   < > 124*  124*   < > 126*   < > 126* 128* 128* 131* 136  K 3.0*   < >  --  2.9*  --  2.7*  --  3.0*  --   --   --   --  3.6 3.2*  CL 66*   < >  --  75*  --  81*  --  84*  --   --   --   --  92* 100  CO2 23   < >  --  25  --  25  --  25  --   --   --   --  27 25  GLUCOSE 86   < >  --  125*  --  102*  --  87  --   --   --   --  93 95  BUN 32*   < >  --  33*  --  33*  --  32*  --   --   --   --  13 8  CREATININE 5.27*   < >  --  4.13*  --  2.60*  --  1.71*  --   --   --   --  0.88 0.74  CALCIUM 8.0*   < >  --  7.8*  --  8.0*  --  8.1*  --   --   --   --  8.5* 8.3*  MG 3.5*  --  2.5*  --   --   --   --   --   --   --   --   --   --  1.4*  PHOS 9.3*  --  7.5*  --   --   --   --   --   --   --   --   --   --  2.1*   < > = values in this interval not displayed.  GFR: Estimated Creatinine Clearance: 152.7 mL/min (by C-G formula based on SCr of 0.74 mg/dL).  Liver Function Tests: Recent Labs  Lab 07/12/22 1621  AST 99*  ALT 24  ALKPHOS 70  BILITOT 1.1  PROT 6.2*  ALBUMIN 3.1*    CBG: Recent Labs  Lab  07/15/22 1210 07/15/22 1726 07/15/22 2102 07/16/22 0848 07/16/22 1232  GLUCAP 93 102* 122* 183* 99     Recent Results (from the past 240 hour(s))  Culture, blood (routine x 2)     Status: None (Preliminary result)   Collection Time: 07/12/22  2:26 PM   Specimen: BLOOD  Result Value Ref Range Status   Specimen Description BLOOD SITE NOT SPECIFIED  Final   Special Requests   Final    BOTTLES DRAWN AEROBIC AND ANAEROBIC Blood Culture adequate volume   Culture   Final    NO GROWTH 4 DAYS Performed at [facility], [place] [address], [place], [place] [address]    Report Status PENDING  Incomplete  Resp panel by RT-PCR (RSV, Flu A&B, Covid) Anterior Nasal Swab     Status: None   Collection Time: 07/12/22  2:30 PM   Specimen: Anterior Nasal Swab  Result Value Ref Range Status   SARS Coronavirus 2 by RT PCR NEGATIVE NEGATIVE Final    Comment: (NOTE) SARS-CoV-2 target nucleic acids are NOT DETECTED.  The SARS-CoV-2 RNA is generally detectable in upper respiratory specimens during the acute phase of infection. The lowest concentration of SARS-CoV-2 viral copies this assay can detect is 138 copies/mL. A negative result does not preclude SARS-Cov-2 infection and should not be used as the sole basis for treatment or other patient management decisions. A negative result may occur with  improper specimen collection/handling, submission of specimen other than nasopharyngeal swab, presence of viral mutation(s) within the areas targeted by this assay, and inadequate number of viral copies(<138 copies/mL). A negative result must be combined with clinical observations, patient history, and epidemiological information. The expected result is Negative.  Fact Sheet for Patients:  EntrepreneurPulse.com.au  Fact Sheet for Healthcare Providers:  IncredibleEmployment.be  This test is no t yet approved or cleared by the [place] FDA and  has been  authorized for detection and/or diagnosis of SARS-CoV-2 by FDA under an Emergency Use Authorization (EUA). This EUA will remain  in effect (meaning this test can be used) for the duration of the COVID-19 declaration under Section 564(b)(1) of the Act, 21 U.S.C.section 360bbb-3(b)(1), unless the authorization is terminated  or revoked sooner.       Influenza A by PCR NEGATIVE NEGATIVE Final   Influenza B by PCR NEGATIVE NEGATIVE Final    Comment: (NOTE) The Xpert Xpress SARS-CoV-2/FLU/RSV plus assay is intended as an aid in the diagnosis of influenza from Nasopharyngeal swab specimens and should not be used as a sole basis for treatment. Nasal washings and aspirates are unacceptable for Xpert Xpress SARS-CoV-2/FLU/RSV testing.  Fact Sheet for Patients: EntrepreneurPulse.com.au  Fact Sheet for Healthcare Providers: IncredibleEmployment.be  This test is not yet approved or cleared by the [place] FDA and has been authorized for detection and/or diagnosis of SARS-CoV-2 by FDA under an Emergency Use Authorization (EUA). This EUA will remain in effect (meaning this test can be used) for the duration of the COVID-19 declaration under Section 564(b)(1) of the Act, 21 U.S.C. section 360bbb-3(b)(1), unless the authorization is terminated or revoked.     Resp Syncytial Virus by PCR NEGATIVE NEGATIVE Final    Comment: (NOTE)  Fact Sheet for Patients: BloggerCourse.com  Fact Sheet for Healthcare Providers: SeriousBroker.it  This test is not yet approved or cleared by the [place] FDA and has been authorized for detection and/or diagnosis of SARS-CoV-2 by FDA under an Emergency Use Authorization (EUA). This EUA will remain in effect (meaning this test can be used) for the duration of the COVID-19 declaration under Section 564(b)(1) of the Act, 21 U.S.C. section 360bbb-3(b)(1), unless the  authorization is terminated or revoked.  Performed at [facility], [address] [place], [place] [address]   Culture, blood (routine x 2)     Status: None (Preliminary result)   Collection Time: 07/12/22  2:31 PM   Specimen: BLOOD  Result Value Ref Range Status   Specimen Description BLOOD BLOOD RIGHT HAND  Final   Special Requests AEROBIC BOTTLE ONLY Blood Culture adequate volume  Final   Culture   Final    NO GROWTH 4 DAYS Performed at [facility], [address], [place], [place] [address]    Report Status PENDING  Incomplete  MRSA Next Gen by PCR, Nasal     Status: None   Collection Time: 07/12/22  5:49 PM   Specimen: Nasal Mucosa; Nasal Swab  Result Value Ref Range Status   MRSA by PCR Next Gen NOT DETECTED NOT DETECTED Final    Comment: (NOTE) The GeneXpert MRSA Assay (FDA approved for NASAL specimens only), is one component of a comprehensive MRSA colonization surveillance program. It is not intended to diagnose MRSA infection nor to guide or monitor treatment for MRSA infections. Test performance is not FDA approved in patients less than 65 years old. Performed at [facility], [address], [place], [place] [address]   C Difficile Quick Screen w PCR reflex     Status: None   Collection Time: 07/14/22  2:41 PM  Result Value Ref Range Status   C Diff antigen NEGATIVE NEGATIVE Final   C Diff toxin NEGATIVE NEGATIVE Final   C Diff interpretation No C. difficile detected.  Final    Comment: Performed at [facility], [address], [place], [place] [address]         Radiology Studies: No results found.      Scheduled Meds:  apixaban  5 mg Oral BID   doxycycline  100 mg Oral Q12H   folic acid  1 mg Intravenous Daily   insulin aspart  0-15 Units Subcutaneous TID WC   metoprolol tartrate  12.5 mg Oral BID   pantoprazole (PROTONIX) IV  40 mg Intravenous Q24H   PHENObarbital  32.5 mg Intravenous Q8H   phosphorus  500 mg Oral  TID   potassium chloride  30 mEq Oral Q6H   thiamine (VITAMIN B1) injection  100 mg Intravenous Daily   Continuous Infusions:  sodium chloride     cefTRIAXone (ROCEPHIN)  IV 2 g (07/16/22 1224)   levETIRAcetam 1,000 mg (07/16/22 1106)   promethazine (PHENERGAN) injection (IM or IVPB)       LOS: 4 days        Huey Bienenstock, MD [facility]   To contact the attending provider between 7A-7P or the covering provider during after hours 7P-7A, please log into the web site [email or web address] and access using universal [place] password for that web site. If you do not have the password, please call the hospital operator.  07/16/2022, 1:07 PM

## 2022-07-17 ENCOUNTER — Other Ambulatory Visit (HOSPITAL_COMMUNITY): Payer: Self-pay

## 2022-07-17 LAB — CBC
HCT: 28.8 % — ABNORMAL LOW (ref 39.0–52.0)
Hemoglobin: 9.7 g/dL — ABNORMAL LOW (ref 13.0–17.0)
MCH: 33.3 pg (ref 26.0–34.0)
MCHC: 33.7 g/dL (ref 30.0–36.0)
MCV: 99 fL (ref 80.0–100.0)
Platelets: 248 10*3/uL (ref 150–400)
RBC: 2.91 MIL/uL — ABNORMAL LOW (ref 4.22–5.81)
RDW: 12.3 % (ref 11.5–15.5)
WBC: 8.4 10*3/uL (ref 4.0–10.5)
nRBC: 0 % (ref 0.0–0.2)

## 2022-07-17 LAB — GLUCOSE, CAPILLARY
Glucose-Capillary: 95 mg/dL (ref 70–99)
Glucose-Capillary: 98 mg/dL (ref 70–99)
Glucose-Capillary: 106 mg/dL — ABNORMAL HIGH (ref 70–99)
Glucose-Capillary: 125 mg/dL — ABNORMAL HIGH (ref 70–99)

## 2022-07-17 LAB — BASIC METABOLIC PANEL
Anion gap: 10 (ref 5–15)
BUN: 5 mg/dL — ABNORMAL LOW (ref 6–20)
CO2: 22 mmol/L (ref 22–32)
Calcium: 8.2 mg/dL — ABNORMAL LOW (ref 8.9–10.3)
Chloride: 103 mmol/L (ref 98–111)
Creatinine, Ser: 0.67 mg/dL (ref 0.61–1.24)
GFR, Estimated: >60 mL/min (ref >60)
Glucose, Bld: 91 mg/dL (ref 70–99)
Potassium: 3.2 mmol/L — ABNORMAL LOW (ref 3.5–5.1)
Sodium: 135 mmol/L (ref 135–145)

## 2022-07-17 LAB — CULTURE, BLOOD (ROUTINE X 2)
Culture: NO GROWTH
Culture: NO GROWTH
Special Requests: ADEQUATE
Special Requests: ADEQUATE

## 2022-07-17 LAB — MAGNESIUM: Magnesium: 1.4 mg/dL — ABNORMAL LOW (ref 1.7–2.4)

## 2022-07-17 LAB — PHOSPHORUS: Phosphorus: 2.4 mg/dL — ABNORMAL LOW (ref 2.5–4.6)

## 2022-07-17 MED ORDER — METOPROLOL TARTRATE 25 MG PO TABS
25.0000 mg | ORAL_TABLET | Freq: Two times a day (BID) | ORAL | 0 refills | Status: DC
  Filled 2022-07-17: qty 60, 30d supply, fill #0

## 2022-07-17 MED ORDER — AMOXICILLIN-POT CLAVULANATE 875-125 MG PO TABS
1.0000 | ORAL_TABLET | Freq: Two times a day (BID) | ORAL | 0 refills | Status: DC
  Filled 2022-07-17: qty 10, 5d supply, fill #0

## 2022-07-17 MED ORDER — K PHOS MONO-SOD PHOS DI & MONO 155-852-130 MG PO TABS: 500.0000 mg | ORAL_TABLET | Freq: Three times a day (TID) | ORAL | Status: DC

## 2022-07-17 MED ORDER — K PHOS MONO-SOD PHOS DI & MONO 155-852-130 MG PO TABS
250.0000 mg | ORAL_TABLET | Freq: Three times a day (TID) | ORAL | Status: DC
  Administered 2022-07-17 – 2022-07-18 (×3): 250 mg via ORAL
  Filled 2022-07-17 (×3): qty 1

## 2022-07-17 MED ORDER — METOPROLOL TARTRATE 25 MG PO TABS
25.0000 mg | ORAL_TABLET | Freq: Two times a day (BID) | ORAL | Status: DC
  Administered 2022-07-17 – 2022-07-18 (×3): 25 mg via ORAL
  Filled 2022-07-17 (×2): qty 1

## 2022-07-17 MED ORDER — K PHOS MONO-SOD PHOS DI & MONO 155-852-130 MG PO TABS
250.0000 mg | ORAL_TABLET | Freq: Two times a day (BID) | ORAL | 0 refills | Status: AC
  Filled 2022-07-17: qty 6, 3d supply, fill #0

## 2022-07-17 MED ORDER — MAGNESIUM SULFATE 4 GM/100ML IV SOLN
4.0000 g | Freq: Once | INTRAVENOUS | Status: AC
  Administered 2022-07-17: 4 g via INTRAVENOUS
  Filled 2022-07-17: qty 100

## 2022-07-17 MED ORDER — APIXABAN 5 MG PO TABS
5.0000 mg | ORAL_TABLET | Freq: Two times a day (BID) | ORAL | 0 refills | Status: DC
  Filled 2022-07-17: qty 60, 30d supply, fill #0

## 2022-07-17 MED ORDER — LEVETIRACETAM 500 MG PO TABS
500.0000 mg | ORAL_TABLET | Freq: Two times a day (BID) | ORAL | 0 refills | Status: DC
  Filled 2022-07-17: qty 60, 30d supply, fill #0

## 2022-07-17 MED ORDER — PANTOPRAZOLE SODIUM 40 MG PO TBEC
40.0000 mg | DELAYED_RELEASE_TABLET | Freq: Every day | ORAL | 0 refills | Status: DC
  Filled 2022-07-17: qty 30, 30d supply, fill #0

## 2022-07-17 MED ORDER — POTASSIUM CHLORIDE CRYS ER 20 MEQ PO TBCR
30.0000 meq | EXTENDED_RELEASE_TABLET | Freq: Four times a day (QID) | ORAL | Status: AC
  Administered 2022-07-17 (×2): 30 meq via ORAL
  Filled 2022-07-17 (×2): qty 1

## 2022-07-17 NOTE — Progress Notes (Signed)
Outpatient substance use resources on AVS for patient to follow up with.  Gilmore Laroche, MSW, [facility]

## 2022-07-17 NOTE — Evaluation (Addendum)
Physical Therapy Evaluation Patient Details Name: Brett Molina MRN: [id number] DOB: 1990/09/16 Today's Date: 07/17/2022  History of Present Illness  32 y/o M admitted on 1/7 for seizure after ETOH abuse. Pt with a tonic-clonic seizure witnessed in the ED on arrival. Pt noted to have extensive blisters on B feet, podiatry recommending WBAT with B post-op shoes. PMHx of ETOH abuse and HTN.  Clinical Impression  Pt presents today functioning close to or at his mobility baseline. Pt was independent with all mobility prior to admission and reports no concerns with returning home. Pt able to perform bed mobility, transfers, ambulation and stair negotiation independently, abiding by weight bearing precautions. Pt able to independently donn/doff his post-op shoes, educated on importance of pt wearing them with all OOB mobility and pt verbalizing understanding. Pt with no further benefit from skilled acute PT at this time, recommend return home at discharge. PT signing off.       Recommendations for follow up therapy are one component of a multi-disciplinary discharge planning process, led by the attending physician.  Recommendations may be updated based on patient status, additional functional criteria and insurance authorization.  Follow Up Recommendations No PT follow up      Assistance Recommended at Discharge None  Patient can return home with the following       Equipment Recommendations None recommended by PT  Recommendations for Other Services       Functional Status Assessment Patient has not had a recent decline in their functional status     Precautions / Restrictions Precautions Precautions: None Required Braces or Orthoses: Other Brace Other Brace: BLE post-op shoes Restrictions Weight Bearing Restrictions: Yes RLE Weight Bearing: Weight bearing as tolerated LLE Weight Bearing: Weight bearing as tolerated Other Position/Activity Restrictions: BLE WBAT with post-op shoes       Mobility  Bed Mobility Overal bed mobility: Modified Independent Bed Mobility: Supine to Sit, Sit to Supine     Supine to sit: Modified independent (Device/Increase time), HOB elevated Sit to supine: Modified independent (Device/Increase time), HOB elevated   General bed mobility comments: Has HOB elevated but no concerns with mobility    Transfers Overall transfer level: Independent Equipment used: None Transfers: Sit to/from Stand Sit to Stand: Independent           General transfer comment: no concerns    Ambulation/Gait Ambulation/Gait assistance: Independent Gait Distance (Feet): 350 Feet Assistive device: None Gait Pattern/deviations: Decreased step length - left, Decreased step length - right, Step-through pattern Gait velocity: decreased initially but progressing to [facility]     General Gait Details: decreased step length B due to post-op shoes, downward gaze initially but self-correcting with increased distance  Stairs Stairs: Yes Stairs assistance: Modified independent (Device/Increase time) Stair Management: Two rails, Alternating pattern, Forwards Number of Stairs: 10 General stair comments: alternating steps with use of B rails, pt cued for taking time with post-op shoes but no LOB or sway noted.  Wheelchair Mobility    Modified Rankin (Stroke Patients Only)       Balance Overall balance assessment: No apparent balance deficits (not formally assessed)                                           Pertinent Vitals/Pain Pain Assessment Pain Assessment: No/denies pain    Home Living Family/patient expects to be discharged to:: Private residence Living Arrangements: Alone  Available Help at Discharge: Neighbor;Available PRN/intermittently Type of Home: Apartment Home Access: Stairs to enter Entrance Stairs-Rails: Left;Right;Can reach both Entrance Stairs-Number of Steps: 10   Home Layout: One level Home Equipment: Chartered certified accountant (2 wheels)      Prior Function Prior Level of Function : Independent/Modified Independent             Mobility Comments: Pt independent prior to admission, utilizes ride share for transportation.       Hand Dominance        Extremity/Trunk Assessment   Upper Extremity Assessment Upper Extremity Assessment: Overall WFL for tasks assessed    Lower Extremity Assessment Lower Extremity Assessment: Overall WFL for tasks assessed    Cervical / Trunk Assessment Cervical / Trunk Assessment: Normal  Communication   Communication: No difficulties  Cognition Arousal/Alertness: Awake/alert Behavior During Therapy: WFL for tasks assessed/performed Overall Cognitive Status: Within Functional Limits for tasks assessed                   Orientation Level: Person, Place, Time, Situation                      General Comments      Exercises     Assessment/Plan    PT Assessment Patient does not need any further PT services  PT Problem List         PT Treatment Interventions      PT Goals     All assessment and education completed. DC therapy.     Frequency       Co-evaluation               AM-PAC PT "6 Clicks" Mobility  Outcome Measure Help needed turning from your back to your side while in a flat bed without using bedrails?: None Help needed moving from lying on your back to sitting on the side of a flat bed without using bedrails?: None Help needed moving to and from a bed to a chair (including a wheelchair)?: None Help needed standing up from a chair using your arms (e.g., wheelchair or bedside chair)?: None Help needed to walk in hospital room?: None Help needed climbing 3-5 steps with a railing? : None 6 Click Score: 24    End of Session   Activity Tolerance: Patient tolerated treatment well Patient left: in bed;with call bell/phone within reach Nurse Communication: Mobility status PT Visit Diagnosis: Difficulty in walking,  not elsewhere classified (R26.2)    Time: 0867-6195 PT Time Calculation (min) (ACUTE ONLY): 15 min   Charges:   PT Evaluation $PT Eval Low Complexity: 1 Low          Charlynne Cousins, PT DPT Acute Rehabilitation Services Office [phone]   Luvenia Heller 07/17/2022, 11:29 AM

## 2022-07-17 NOTE — TOC Progression Note (Addendum)
Transition of Care [facility]) - Progression Note    Patient Details  Name: Brett Molina MRN: [id number] Date of Birth: 26-Jul-1990  Transition of Care [facility]) CM/SW [place], RN Phone Number: 07/17/2022, 8:38 AM  Clinical Narrative:    Patient will likely be discharged 07/18/2021 appointment made with [facility] and wellness for follow up. Will try to secure home health RN to visit and provide dressings for foot wound. Snet message to [facility] regarding status of medicaid.  Reached out to [place] who is Charity this week, for acceptance for RN for wound management. [facility] turned down for charity RN due to noncompliance [address] done, medications in main pharmacy for potential DC in AM ID: [id number] Bin: [id number] RX group: [id number]   Expected Discharge Plan: Home/Self Care Barriers to Discharge: Inadequate or no insurance  Expected Discharge Plan and Services In-house Referral: Development worker, community, PCP / [facility] Discharge Planning Services: Follow-up appt scheduled                                           Social Determinants of Health (SDOH) Interventions SDOH Screenings   Depression (PHQ2-9): Low Risk  (12/27/2018)  Tobacco Use: Low Risk  (07/12/2022)    Readmission Risk Interventions     No data to display

## 2022-07-17 NOTE — Progress Notes (Signed)
PROGRESS NOTE    Brett Molina  EXH:[id number] DOB: [birth year] DOA: 07/12/2022 PCP: Pcp, No    Chief Complaint  Patient presents with   Seizures    Aspiration, ETOH    Brief Narrative:   Brett Molina is a 32 year old male with a past medical history significant for EtOH abuse and hypertension who presented to the emergency department after vomiting at home reports of binge drinking few days prior.  Per EMS patient has seizure while vomiting with high concern for aspiration prior to ED arrival, 5 mg Versed given during transport.  Patient had witnessed tonic-clonic seizure in ED at which time access was placed due to concern for needing intubation.   Patient presented with hypotension and mild tachycardia.  Lab work pending due to faculty obtaining access sites.  I-STAT chemistry and CBC with sodium 111, glucose 235, creatinine 6.30, ionized calcium 0.75, troponin 23, WBC 33.3, lactic acid greater than 9.  Admitted initially to ICU, he was transferred to [facility] care on 07/15/2022.  Assessment & Plan:   Principal Problem:   Seizure (HCC) Active Problems:   Hyperphosphatemia   Skin ulcer of great toe, limited to breakdown of skin (HCC)   Acute metabolic encephalopathy secondary to alcohol withdrawals related seizures  -  reports of binge drinking for a few days PTA with complete cessation of ETOH. Unknown -Continue with seizures precaution - Cont Keppra > changing dosing in regards to renal function  - Cont thiamine and folate   - Liberalize Diet >- Ensure adequate nutrition and bowel regimen  -Mentation back to baseline.   Likely evolving sepsis due to underlying aspiration pneumonia as well due to infected wounds in the toes bilaterally - On presentation patient had mild hypotension, tachycardia, and elevated lactic greater than 9. CXR not showing signs of Asp PNA  -Currently on vancomycin and Rocephin, currently narrowed to Rocephin, to finish total 5 days  Rocephin for pneumonia, did add doxycycline for his lower extremity wounds, he can be transition to Augmentin on discharge.  - Blood Cx > NGTD - Resp Panel Neg - Ensure adequate renal perfusion  - Cont Vanco (Start 1/7 - Stop 1/9)  Ceftriaxone (Started 1/8) x5days  EtOH abuse - Seizure precautions  - CIWA protocol - Phenobarbital Taper - Monitor signs for withdrawal    Severe acute kidney injury with AGMA Hypokalemia/Hyperphosphatemia  -Improving, continue to replete as needed.  Hyponatremai/ beer potomania -Resolved with hydration.  Hypokalemia Hypomagnesemia Hypophosphatemia -Remains low today, will replete again.  Hyperglycemia - CBG monitoring, no recurrence   Multiple wounds to bilateral feet - Continue with wound care -Dietary consult greatly appreciated, continue with local wound care, no indication for surgical intervention, ambulate as tolerated.   Hx of PE -  On Heparin gtt>> bacl on Eliquis   Hypertension  Plan  - Continue Labetalol PRN IV -will increase his metoprolol specially with sinus tachycardia.   DVT prophylaxis: Eliquis Code Status: Full Family Communication: None at bedside Disposition:   Status is: Inpatient  Consultants:  PCCM  Subjective:  No significant events overnight, he denies any complaints today.  Objective: Vitals:   07/16/22 2140 07/17/22 0000 07/17/22 0744 07/17/22 1200  BP: 122/79 122/75 (!) 137/93 (!) 142/82  Pulse: (!) 108 96 (!) 109 (!) 104  Resp: 18 20 20 19   Temp: 99 F (37.2 C) 98.6 F (37 C) 98 F (36.7 C) 97.9 F (36.6 C)  TempSrc: Oral Oral Oral Oral  SpO2: 98% 96%  98%  Weight:       No intake or output data in the 24 hours ending 07/17/22 1228  Filed Weights   07/13/22 0500 07/14/22 0400 07/16/22 0500  Weight: 100.1 kg 97 kg 97.1 kg    Examination:  Awake Alert, Oriented X 3, No new F.N deficits, Normal affect Symmetrical Chest wall movement, Good air movement bilaterally, CTAB Acute  cardiac,No Gallops,Rubs or new Murmurs, No Parasternal Heave +ve B.Sounds, Abd Soft, No tenderness, No rebound - guarding or rigidity. No Cyanosis, Clubbing , bilateral toes bandaged   Data Reviewed: I have personally reviewed following labs and imaging studies  CBC: Recent Labs  Lab 07/12/22 1432 07/12/22 1441 07/13/22 0414 07/14/22 0440 07/15/22 0642 07/16/22 0451 07/17/22 0638  WBC 33.5*   < > 24.9* 20.4* 15.0* 10.0 8.4  NEUTROABS 29.4*  --  21.9*  --   --   --   --   HGB 14.0   < > 10.8* 10.2* 9.9* 9.5* 9.7*  HCT 40.4   < > 30.2* 29.3* 28.7* 29.2* 28.8*  MCV 94.8   < > 91.8 94.5 95.3 99.7 99.0  PLT 267   < > 176 174 194 221 248   < > = values in this interval not displayed.    Basic Metabolic Panel: Recent Labs  Lab 07/12/22 1621 07/12/22 2002 07/13/22 0414 07/13/22 9381 07/13/22 1634 07/14/22 0110 07/14/22 0440 07/14/22 8299 07/14/22 1802 07/14/22 2141 07/15/22 0642 07/16/22 0451 07/17/22 0638  NA 121*   < >  --    < > 124*  124*   < > 126*   < > 128* 128* 131* 136 135  K 3.0*   < >  --    < > 2.7*  --  3.0*  --   --   --  3.6 3.2* 3.2*  CL 66*   < >  --    < > 81*  --  84*  --   --   --  92* 100 103  CO2 23   < >  --    < > 25  --  25  --   --   --  27 25 22   GLUCOSE 86   < >  --    < > 102*  --  87  --   --   --  93 95 91  BUN 32*   < >  --    < > 33*  --  32*  --   --   --  13 8 5*  CREATININE 5.27*   < >  --    < > 2.60*  --  1.71*  --   --   --  0.88 0.74 0.67  CALCIUM 8.0*   < >  --    < > 8.0*  --  8.1*  --   --   --  8.5* 8.3* 8.2*  MG 3.5*  --  2.5*  --   --   --   --   --   --   --   --  1.4* 1.4*  PHOS 9.3*  --  7.5*  --   --   --   --   --   --   --   --  2.1* 2.4*   < > = values in this interval not displayed.    GFR: Estimated Creatinine Clearance: 152.7 mL/min (by C-G formula based on SCr of 0.67 mg/dL).  Liver Function Tests: Recent Labs  Lab 07/12/22 1621  AST 99*  ALT 24  ALKPHOS 70  BILITOT 1.1  PROT 6.2*  ALBUMIN 3.1*     CBG: Recent Labs  Lab 07/16/22 1232 07/16/22 1626 07/16/22 2115 07/17/22 0745 07/17/22 1216  GLUCAP 99 90 121* 95 98     Recent Results (from the past 240 hour(s))  Culture, blood (routine x 2)     Status: None (Preliminary result)   Collection Time: 07/12/22  2:26 PM   Specimen: BLOOD  Result Value Ref Range Status   Specimen Description BLOOD SITE NOT SPECIFIED  Final   Special Requests   Final    BOTTLES DRAWN AEROBIC AND ANAEROBIC Blood Culture adequate volume   Culture   Final    NO GROWTH 4 DAYS Performed at [facility], [place] [address] [place], [place] [address]    Report Status PENDING  Incomplete  Resp panel by RT-PCR (RSV, Flu A&B, Covid) Anterior Nasal Swab     Status: None   Collection Time: 07/12/22  2:30 PM   Specimen: Anterior Nasal Swab  Result Value Ref Range Status   SARS Coronavirus 2 by RT PCR NEGATIVE NEGATIVE Final    Comment: (NOTE) SARS-CoV-2 target nucleic acids are NOT DETECTED.  The SARS-CoV-2 RNA is generally detectable in upper respiratory specimens during the acute phase of infection. The lowest concentration of SARS-CoV-2 viral copies this assay can detect is 138 copies/mL. A negative result does not preclude SARS-Cov-2 infection and should not be used as the sole basis for treatment or other patient management decisions. A negative result may occur with  improper specimen collection/handling, submission of specimen other than nasopharyngeal swab, presence of viral mutation(s) within the areas targeted by this assay, and inadequate number of viral copies(<138 copies/mL). A negative result must be combined with clinical observations, patient history, and epidemiological information. The expected result is Negative.  Fact Sheet for Patients:  EntrepreneurPulse.com.au  Fact Sheet for Healthcare Providers:  IncredibleEmployment.be  This test is no t yet approved or cleared by the [place] FDA and  has been authorized for detection and/or diagnosis of SARS-CoV-2 by FDA under an Emergency Use Authorization (EUA). This EUA will remain  in effect (meaning this test can be used) for the duration of the COVID-19 declaration under Section 564(b)(1) of the Act, 21 U.S.C.section 360bbb-3(b)(1), unless the authorization is terminated  or revoked sooner.       Influenza A by PCR NEGATIVE NEGATIVE Final   Influenza B by PCR NEGATIVE NEGATIVE Final    Comment: (NOTE) The Xpert Xpress SARS-CoV-2/FLU/RSV plus assay is intended as an aid in the diagnosis of influenza from Nasopharyngeal swab specimens and should not be used as a sole basis for treatment. Nasal washings and aspirates are unacceptable for Xpert Xpress SARS-CoV-2/FLU/RSV testing.  Fact Sheet for Patients: EntrepreneurPulse.com.au  Fact Sheet for Healthcare Providers: IncredibleEmployment.be  This test is not yet approved or cleared by the [place] FDA and has been authorized for detection and/or diagnosis of SARS-CoV-2 by FDA under an Emergency Use Authorization (EUA). This EUA will remain in effect (meaning this test can be used) for the duration of the COVID-19 declaration under Section 564(b)(1) of the Act, 21 U.S.C. section 360bbb-3(b)(1), unless the authorization is terminated or revoked.     Resp Syncytial Virus by PCR NEGATIVE NEGATIVE Final    Comment: (NOTE) Fact Sheet for Patients: EntrepreneurPulse.com.au  Fact Sheet for Healthcare Providers: IncredibleEmployment.be  This test is not yet approved or cleared by  the [employer] and has been authorized for detection and/or diagnosis of SARS-CoV-2 by FDA under an Emergency Use Authorization (EUA). This EUA will remain in effect (meaning this test can be used) for the duration of the COVID-19 declaration under Section 564(b)(1) of the Act, 21 U.S.C. section  360bbb-3(b)(1), unless the authorization is terminated or revoked.  Performed at [facility], [place] [address] [place], [place] [address]   Culture, blood (routine x 2)     Status: None (Preliminary result)   Collection Time: 07/12/22  2:31 PM   Specimen: BLOOD  Result Value Ref Range Status   Specimen Description BLOOD BLOOD RIGHT HAND  Final   Special Requests AEROBIC BOTTLE ONLY Blood Culture adequate volume  Final   Culture   Final    NO GROWTH 4 DAYS Performed at [facility], [place] [address] [place], [place] [address]    Report Status PENDING  Incomplete  MRSA Next Gen by PCR, Nasal     Status: None   Collection Time: 07/12/22  5:49 PM   Specimen: Nasal Mucosa; Nasal Swab  Result Value Ref Range Status   MRSA by PCR Next Gen NOT DETECTED NOT DETECTED Final    Comment: (NOTE) The GeneXpert MRSA Assay (FDA approved for NASAL specimens only), is one component of a comprehensive MRSA colonization surveillance program. It is not intended to diagnose MRSA infection nor to guide or monitor treatment for MRSA infections. Test performance is not FDA approved in patients less than 41 years old. Performed at [facility], [place] [address], [place], [place] [address]   C Difficile Quick Screen w PCR reflex     Status: None   Collection Time: 07/14/22  2:41 PM  Result Value Ref Range Status   C Diff antigen NEGATIVE NEGATIVE Final   C Diff toxin NEGATIVE NEGATIVE Final   C Diff interpretation No C. difficile detected.  Final    Comment: Performed at [facility], [place] [address], [place], [place] [address]         Radiology Studies: No results found.      Scheduled Meds:  apixaban  5 mg Oral BID   doxycycline  100 mg Oral B44H   folic acid  1 mg Intravenous Daily   insulin aspart  0-15 Units Subcutaneous TID WC   metoprolol tartrate  25 mg Oral BID   pantoprazole (PROTONIX) IV  40 mg Intravenous Q24H   PHENObarbital  32.5 mg Intravenous Q8H    thiamine (VITAMIN B1) injection  100 mg Intravenous Daily   Continuous Infusions:  sodium chloride     levETIRAcetam 1,000 mg (07/17/22 1108)   promethazine (PHENERGAN) injection (IM or IVPB)       LOS: 5 days        Phillips Climes, MD [facility]   To contact the attending provider between 7A-7P or the covering provider during after hours 7P-7A, please log into the web site [email or web address] and access using universal Gem password for that web site. If you do not have the password, please call the hospital operator.  07/17/2022, 12:28 PM

## 2022-07-17 NOTE — TOC Transition Note (Signed)
Discharge medications (6) are being stored in the main pharmacy on the ground floor until patient is ready for discharge.   

## 2022-07-18 DIAGNOSIS — F1010 Alcohol abuse, uncomplicated: Secondary | ICD-10-CM

## 2022-07-18 DIAGNOSIS — G9341 Metabolic encephalopathy: Secondary | ICD-10-CM

## 2022-07-18 LAB — BASIC METABOLIC PANEL
Anion gap: 9 (ref 5–15)
BUN: <5 mg/dL — ABNORMAL LOW (ref 6–20)
CO2: 22 mmol/L (ref 22–32)
Calcium: 8.1 mg/dL — ABNORMAL LOW (ref 8.9–10.3)
Chloride: 106 mmol/L (ref 98–111)
Creatinine, Ser: 0.67 mg/dL (ref 0.61–1.24)
GFR, Estimated: >60 mL/min (ref >60)
Glucose, Bld: 100 mg/dL — ABNORMAL HIGH (ref 70–99)
Potassium: 3.2 mmol/L — ABNORMAL LOW (ref 3.5–5.1)
Sodium: 137 mmol/L (ref 135–145)

## 2022-07-18 LAB — GLUCOSE, CAPILLARY: Glucose-Capillary: 116 mg/dL — ABNORMAL HIGH (ref 70–99)

## 2022-07-18 LAB — CBC
HCT: 29.2 % — ABNORMAL LOW (ref 39.0–52.0)
Hemoglobin: 9.7 g/dL — ABNORMAL LOW (ref 13.0–17.0)
MCH: 33.3 pg (ref 26.0–34.0)
MCHC: 33.2 g/dL (ref 30.0–36.0)
MCV: 100.3 fL — ABNORMAL HIGH (ref 80.0–100.0)
Platelets: 269 10*3/uL (ref 150–400)
RBC: 2.91 MIL/uL — ABNORMAL LOW (ref 4.22–5.81)
RDW: 12.6 % (ref 11.5–15.5)
WBC: 6.9 10*3/uL (ref 4.0–10.5)
nRBC: 0 % (ref 0.0–0.2)

## 2022-07-18 LAB — MAGNESIUM: Magnesium: 1.9 mg/dL (ref 1.7–2.4)

## 2022-07-18 MED ORDER — POTASSIUM CHLORIDE CRYS ER 20 MEQ PO TBCR
40.0000 meq | EXTENDED_RELEASE_TABLET | Freq: Once | ORAL | Status: AC
  Administered 2022-07-18: 40 meq via ORAL
  Filled 2022-07-18: qty 2

## 2022-07-18 MED ORDER — LEVETIRACETAM 500 MG PO TABS
500.0000 mg | ORAL_TABLET | Freq: Two times a day (BID) | ORAL | Status: DC
  Administered 2022-07-18: 500 mg via ORAL
  Filled 2022-07-18: qty 1

## 2022-07-18 NOTE — Discharge Instructions (Signed)
Follow with Primary MD    Get CBC, CMP,   by Primary MD next visit.    Activity: As tolerated with Full fall precautions use walker/cane & assistance as needed   Disposition Home    Diet: Regular    On your next visit with your primary care physician please Get Medicines reviewed and adjusted.   Please request your Prim.MD to go over all Hospital Tests and Procedure/Radiological results at the follow up, please get all Hospital records sent to your Prim MD by signing hospital release before you go home.   If you experience worsening of your admission symptoms, develop shortness of breath, life threatening emergency, suicidal or homicidal thoughts you must seek medical attention immediately by calling 911 or calling your MD immediately  if symptoms less severe.  You Must read complete instructions/literature along with all the possible adverse reactions/side effects for all the Medicines you take and that have been prescribed to you. Take any new Medicines after you have completely understood and accpet all the possible adverse reactions/side effects.   Do not drive, operating heavy machinery, perform activities at heights, swimming or participation in water activities or provide baby sitting services if your were admitted for syncope or siezures until you have seen by Primary MD or a Neurologist and advised to do so again.  Do not drive when taking Pain medications.    Do not take more than prescribed Pain, Sleep and Anxiety Medications  Special Instructions: If you have smoked or chewed Tobacco  in the last 2 yrs please stop smoking, stop any regular Alcohol  and or any Recreational drug use.  Wear Seat belts while driving.   Please note  You were cared for by a hospitalist during your hospital stay. If you have any questions about your discharge medications or the care you received while you were in the hospital after you are discharged, you can call the unit and asked to  speak with the hospitalist on call if the hospitalist that took care of you is not available. Once you are discharged, your primary care physician will handle any further medical issues. Please note that NO REFILLS for any discharge medications will be authorized once you are discharged, as it is imperative that you return to your primary care physician (or establish a relationship with a primary care physician if you do not have one) for your aftercare needs so that they can reassess your need for medications and monitor your lab values.

## 2022-07-18 NOTE — Social Work (Signed)
LCSW gave pt a jacket and a taxi voucher to his home.   Emeterio Reeve, LCSW Clinical Social Worker

## 2022-07-18 NOTE — Progress Notes (Signed)
Reviewed discharge instructions with patient and he verbalized understanding. Educated and witness teach-back instruction with how to daily dressing changes to bilateral feet. Per MD, supply patient with 2 weeks supplies of dressing change materials. Discharge meds were delivered by pharmacist. All belongings with patient prior to leaving unit. All questions and concerns answered.

## 2022-07-18 NOTE — Discharge Summary (Signed)
Physician Discharge Summary  Brett Molina WJX:[id number] DOB: 09-04-1990 DOA: 07/12/2022  PCP: Pcp, No  Admit date: 07/12/2022 Discharge date: 07/18/2022  Admitted From: (Home) Disposition:  (Home )  Recommendations for Outpatient Follow-up:  Please obtain BMP/CBC in one week Patient instructed to keep his follow-up appointment with [facility] on 2/7 I have discussed seizure precautions with the patient.  Discharge Condition: (Stable) CODE STATUS: (FULL) Diet recommendation: Heart Healthy  Brief/Interim Summary:  Brett Molina is a 32 year old male with a past medical history significant for EtOH abuse and hypertension who presented to the emergency department after vomiting at home reports of binge drinking few days prior.  Per EMS patient has seizure while vomiting with high concern for aspiration prior to ED arrival, 5 mg Versed given during transport.  Patient had witnessed tonic-clonic seizure in ED at which time access was placed due to concern for needing intubation.   Patient presented with hypotension and mild tachycardia.  Lab work pending due to faculty obtaining access sites.  I-STAT chemistry and CBC with sodium 111, glucose 235, creatinine 6.30, ionized calcium 0.75, troponin 23, WBC 33.3, lactic acid greater than 9.  Admitted initially to ICU, he was [facility] on 07/15/2022.      Acute metabolic encephalopathy secondary to alcohol withdrawals related seizures  -  reports of binge drinking for a few days PTA with complete cessation of ETOH. Unknown -Continue with seizures precaution, I have discussed with him seizure precautions, he will be discharged home on his home dose Keppra 100 mg oral twice daily, discussed with him importance about compliance -Mentation back to baseline   sepsis due to underlying aspiration pneumonia as well due to infected wounds in the toes bilaterally - On presentation patient had mild hypotension,  tachycardia, and elevated lactic greater than 9. CXR not showing signs of Asp PNA  -Initially on vancomycin and Rocephin, currently narrowed to Rocephin, he will be discharged on Augmentin x 5 days - Blood Cx > NGTD - Resp Panel Neg   EtOH abuse - Seizure precautions  - Phenobarbital Taper during hospital stay -no signs of withdrawals for the last 48 hours before discharge   Severe acute kidney injury with AGMA -Resolved with hydration   Hyponatremai/ beer potomania -Resolved with hydration.   Hypokalemia Hypomagnesemia Hypophosphatemia -Needed, will discharge on few days supplement of Neutra-Phos   Hyperglycemia - no recurrence   Multiple wounds to bilateral feet - Continue with wound care, he was educated about wound care, he was given supply Warth 2 weeks to continue wound care at home -Dietary consult greatly appreciated, continue with local wound care, no indication for surgical intervention, ambulate as tolerated.   Hx of PE -  On Heparin gtt>> back on Eliquis   Hypertension  Started on metoprolol, dose uptitrated gradually it due to sinus tachycardia  Received MATCH later this admission, and all meds has been provided at time of discharge total of 30 days  Discharge Diagnoses:  Principal Problem:   Seizure (HCC) Active Problems:   Acute alcohol intoxication (HCC)   Alcohol withdrawal seizure with complication (HCC)   Acute metabolic encephalopathy   Alcohol abuse   Hyperphosphatemia   Skin ulcer of great toe, limited to breakdown of skin [facility])    Discharge Instructions  Discharge Instructions     Diet - low sodium heart healthy   Complete by: As directed    Discharge instructions   Complete by: As directed    Follow  with Primary MD    Get CBC, CMP,   by Primary MD next visit.    Activity: As tolerated with Full fall precautions use walker/cane & assistance as needed   Disposition Home    Diet: Regular    On your next visit with your  primary care physician please Get Medicines reviewed and adjusted.   Please request your Prim.MD to go over all Hospital Tests and Procedure/Radiological results at the follow up, please get all Hospital records sent to your Prim MD by signing hospital release before you go home.   If you experience worsening of your admission symptoms, develop shortness of breath, life threatening emergency, suicidal or homicidal thoughts you must seek medical attention immediately by calling 911 or calling your MD immediately  if symptoms less severe.  You Must read complete instructions/literature along with all the possible adverse reactions/side effects for all the Medicines you take and that have been prescribed to you. Take any new Medicines after you have completely understood and accpet all the possible adverse reactions/side effects.   Do not drive, operating heavy machinery, perform activities at heights, swimming or participation in water activities or provide baby sitting services if your were admitted for syncope or siezures until you have seen by Primary MD or a Neurologist and advised to do so again.  Do not drive when taking Pain medications.    Do not take more than prescribed Pain, Sleep and Anxiety Medications  Special Instructions: If you have smoked or chewed Tobacco  in the last 2 yrs please stop smoking, stop any regular Alcohol  and or any Recreational drug use.  Wear Seat belts while driving.   Please note  You were cared for by a hospitalist during your hospital stay. If you have any questions about your discharge medications or the care you received while you were in the hospital after you are discharged, you can call the unit and asked to speak with the hospitalist on call if the hospitalist that took care of you is not available. Once you are discharged, your primary care physician will handle any further medical issues. Please note that NO REFILLS for any discharge medications  will be authorized once you are discharged, as it is imperative that you return to your primary care physician (or establish a relationship with a primary care physician if you do not have one) for your aftercare needs so that they can reassess your need for medications and monitor your lab values.   Discharge wound care:   Complete by: As directed    Wound care to bilateral feet, digits: Cleanse with NS, pat dry. Apply folded layers of xeroform gauze to degloved tissues and wounds, top with ABD pad folded over toes and secure with Kerlix roll gauze/paper tape. Place feet into Prevalon boots.   Increase activity slowly   Complete by: As directed       Allergies as of 07/18/2022   No Known Allergies      Medication List     STOP taking these medications    acetaminophen 500 MG tablet Commonly known as: TYLENOL   Eliquis DVT/PE Starter Pack Generic drug: Apixaban Starter Pack (10mg  and 5mg ) Replaced by: Eliquis 5 MG Tabs tablet   lisinopril 20 MG tablet Commonly known as: ZESTRIL       TAKE these medications    amoxicillin-clavulanate 875-125 MG tablet Commonly known as: AUGMENTIN Take 1 tablet by mouth 2 (two) times daily.   Eliquis 5 MG Tabs  tablet Generic drug: apixaban Take 1 tablet (5 mg total) by mouth 2 (two) times daily. Replaces: Eliquis DVT/PE Starter Pack   levETIRAcetam 500 MG tablet Commonly known as: Keppra Take 1 tablet (500 mg total) by mouth 2 (two) times daily.   metoprolol tartrate 25 MG tablet Commonly known as: LOPRESSOR Take 1 tablet (25 mg total) by mouth 2 (two) times daily. What changed: how much to take   pantoprazole 40 MG tablet Commonly known as: Protonix Take 1 tablet (40 mg total) by mouth daily.   Phospha 250 Neutral [id number] MG Tabs Take 1 tablet (250 mg total) by mouth 2 (two) times daily for 3 days.               Discharge Care Instructions  (From admission, onward)           Start     Ordered   07/18/22  0000  Discharge wound care:       Comments: Wound care to bilateral feet, digits: Cleanse with NS, pat dry. Apply folded layers of xeroform gauze to degloved tissues and wounds, top with ABD pad folded over toes and secure with Kerlix roll gauze/paper tape. Place feet into Prevalon boots.   07/18/22 1210            Follow-up Information     [facility]. Go on 08/12/2022.   Why: 2:30 Pm Contact information: [address] [place] [address] [phone]        Substance use resources. Call.   Why: Outpatient Providers: [facility] Group and individual counseling. [address] [place], [place] [address] [phone]  [facility]  [address] [place], [place] [address] [phone]   [facility]) [address] [place] [address] [phone]  [facility] [address]  [place], [place] [address] [phone]  Residential Treatment Programs:  [facility] [address] [place], [place] [address] [phone] or [phone]   [facility]  [address]  [place], [place] [address] [phone]  [facility] [address] [place], [place] [address] Phone: [phone]  [facility] [address] [place],  [place], [place] [address] [phone]               No Known Allergies  Consultations: [facility] Podiatry   Procedures/Studies: CT SOFT TISSUE NECK WO CONTRAST  Result Date: 07/13/2022 CLINICAL DATA:  Odynophagia EXAM: CT NECK WITHOUT CONTRAST TECHNIQUE: Multidetector CT imaging of the neck was performed following the standard protocol without intravenous contrast. RADIATION DOSE REDUCTION: This exam was performed according to the departmental dose-optimization program which includes automated exposure control,  adjustment of the mA and/or kV according to patient size and/or use of iterative reconstruction technique. COMPARISON:  None Available. FINDINGS: Pharynx and larynx: There is mucosal thickening in the right nasal cavity. The left nasal cavity and nasopharynx are unremarkable. The oral cavity and oropharynx are unremarkable. The parapharyngeal spaces are clear. The hypopharynx and larynx are unremarkable. The vocal folds are normal in appearance. There is no retropharyngeal fluid collection. The airway is patent. Salivary glands: The parotid and submandibular glands are unremarkable. Thyroid: Unremarkable. Lymph nodes: There is no lymphadenopathy in the neck. Vascular: Unremarkable, as can be evaluated in the absence of intravenous contrast. Limited intracranial: Grossly unremarkable. Visualized orbits: Unremarkable. Mastoids and visualized paranasal  sinuses: There is mild mucosal thickening in the ethmoid air cells and right maxillary sinus. The mastoid air cells and middle ear cavities are clear to the level imaged. Skeleton: There is no acute osseous abnormality or suspicious osseous lesion. Upper chest: The imaged lung apices are clear. The imaged upper esophagus is grossly unremarkable. Other: None. IMPRESSION: Unremarkable noncontrast CT of the neck with no acute finding. Electronically Signed   By: Peter  Noone M.D.   On: 07/13/2022 18:32   DG Foot Complete Right  Result Date: 07/12/2022 CLINICAL DATA:  Fever, post seizures. EXAM: RIGHT FOOT COMPLETE - 3+ VIEW COMPARISON:  None Available. FINDINGS: There is no evidence of fracture or dislocation. There is no evidence of arthropathy or other focal bone abnormality. Soft tissues swelling about the digits. IMPRESSION: Soft tissue swelling about the digits. No fracture or dislocation. Electronically Signed   By: Imran  Ahmed D.O.   On: 07/12/2022 18:26   DG Foot Complete Left  Result Date: 07/12/2022 CLINICAL DATA:  Fever, post seizures. EXAM: LEFT FOOT -  COMPLETE 3+ VIEW COMPARISON:  None Available. FINDINGS: There is no evidence of fracture or dislocation. There is no evidence of arthropathy or other focal bone abnormality. Soft tissues swelling about the digits. IMPRESSION: Soft tissue swelling about the digits, correlate with physical examination findings. No fracture or dislocation. Electronically Signed   By: Imran  Ahmed D.O.   On: 07/12/2022 18:25   DG Chest Port 1 View  Result Date: 07/12/2022 CLINICAL DATA:  Post seizures. EXAM: PORTABLE CHEST 1 VIEW COMPARISON:  Chest radiograph dated June 19, 2022. FINDINGS: The heart size and mediastinal contours are within normal limits. Both lungs are clear. The visualized skeletal structures are unremarkable. IMPRESSION: No active disease. Electronically Signed   By: Imran  Ahmed D.O.   On: 07/12/2022 18:24   CT Head Wo Contrast  Result Date: 07/12/2022 CLINICAL DATA:  Mental status change, unknown cause EXAM: CT HEAD WITHOUT CONTRAST TECHNIQUE: Contiguous axial images were obtained from the base of the skull through the vertex without intravenous contrast. RADIATION DOSE REDUCTION: This exam was performed according to the departmental dose-optimization program which includes automated exposure control, adjustment of the mA and/or kV according to patient size and/or use of iterative reconstruction technique. COMPARISON:  06/18/2022 FINDINGS: Brain: No evidence of acute infarction, hemorrhage, hydrocephalus, extra-axial collection or mass lesion/mass effect. Vascular: No hyperdense vessel or unexpected calcification. Skull: Normal. Negative for fracture or focal lesion. Sinuses/Orbits: No acute finding. IMPRESSION: No acute intracranial process. Electronically Signed   By: Joshua  Pleasure M.D.   On: 07/12/2022 16:58   CT Angio Chest Pulmonary Embolism (PE) W or WO Contrast  Addendum Date: 06/24/2022   ADDENDUM REPORT: 06/24/2022 00:42 ADDENDUM: Critical Value/emergent results were called by telephone at  the time of interpretation on 06/24/2022 at 12:32 am to provider Dr Crosley, who verbally acknowledged these results. Electronically Signed   By: Sriyesh  Krishnan M.D.   On: 06/24/2022 00:42   Result Date: 06/24/2022 CLINICAL DATA:  Elevated D-dimer, exaggerated sinus tachycardia EXAM: CT ANGIOGRAPHY CHEST WITH CONTRAST TECHNIQUE: Multidetector CT imaging of the chest was performed using the standard protocol during bolus administration of intravenous contrast. Multiplanar CT image reconstructions and MIPs were obtained to evaluate the vascular anatomy. RADIATION DOSE REDUCTION: This exam was performed according to the departmental dose-optimization program which includes automated exposure control, adjustment of the mA and/or kV according to patient size and/or use of iterative reconstruction technique. CONTRAST:  [id number] IOHEXOL 350 MG/ML SOLN  COMPARISON:  Chest radiograph dated 06/19/2022 FINDINGS: Cardiovascular: Satisfactory opacification the pulmonary arteries to the segmental level. Isolated subsegmental left lower lobe pulmonary embolism (series 7/image 307). Overall clot burden is very small. Although not tailored for evaluation of the thoracic aorta, there is no evidence of thoracic aortic aneurysm or dissection. The heart is normal in size.  No pericardial effusion. Mediastinum/Nodes: No suspicious mediastinal lymphadenopathy. Visualized thyroid is unremarkable. Lungs/Pleura: Mild bibasilar atelectasis. Faint ground-glass nodularity in the right upper lobe (for example, series 6/image 34), suggesting very mild infection/pneumonia. No focal consolidation. No pleural effusion or pneumothorax. Upper Abdomen: Visualized upper abdomen is notable for excretory contrast in the bilateral renal collecting systems. Musculoskeletal: Visualized osseous structures are within normal limits. Review of the MIP images confirms the above findings. IMPRESSION: Isolated subsegmental left lower lobe pulmonary  embolism. Overall clot burden is very small. Faint ground-glass nodularity in the right upper lobe, suggesting very mild infection/pneumonia. Electronically Signed: By: Charline Bills M.D. On: 06/24/2022 00:23   DG Facial Bones Complete  Result Date: 06/23/2022 CLINICAL DATA:  Evaluate for foreign body in the lip. Chipped tooth in lip. EXAM: FACIAL BONES COMPLETE 3+V COMPARISON:  CT maxillofacial 06/17/2022 FINDINGS: On the lateral view only there is a punctate 1 mm noncalcified density in the soft tissues of the lower lip. When compared to prior CT, this has decreased in size, but is in a similar location. No other radiopaque foreign bodies identified in the soft tissues. Metallic dentition present. No fracture or dislocation. IMPRESSION: On the lateral view only there is a punctate 1 mm noncalcified density in the soft tissues of the lower lip. When compared to prior CT, this has decreased in size, but is in a similar location. Electronically Signed   By: Darliss Cheney M.D.   On: 06/23/2022 21:31   ECHOCARDIOGRAM COMPLETE  Result Date: 06/19/2022    ECHOCARDIOGRAM REPORT   Patient Name:   BRANNON LEVENE [facility] Date of Exam: 06/19/2022 Medical Rec #:  [id number]           Height:       74.0 in Accession #:    [id number]          Weight:       231.5 lb Date of Birth:  March 14, 1991           BSA:          2.313 m Patient Age:    31 years            BP:           101/59 mmHg Patient Gender: M                   HR:           110 bpm. Exam Location:  Inpatient Procedure: 2D Echo, Cardiac Doppler and Color Doppler Indications:    Cardiomyopathy-Unspecified I42.9  History:        Patient has prior history of Echocardiogram examinations, most                 recent 11/20/2018. Risk Factors:Hypertension. Hypothermia.  Sonographer:    Leta Jungling RDCS Referring Phys: [id number] Lorin Glass  Sonographer Comments: Echo performed with patient sitting upright and intubated. IMPRESSIONS  1. Left ventricular ejection  fraction, by estimation, is 60 to 65%. The left ventricle has normal function. The left ventricle has no regional wall motion abnormalities. There is mild left ventricular hypertrophy. Indeterminate diastolic filling due to E-A fusion.  2. Right ventricular systolic function was not well visualized. The right ventricular size is not well visualized.  3. The mitral valve is normal in structure. Trivial mitral valve regurgitation. No evidence of mitral stenosis.  4. The aortic valve was not well visualized. Aortic valve regurgitation is not visualized. No aortic stenosis is present. FINDINGS  Left Ventricle: Left ventricular ejection fraction, by estimation, is 60 to 65%. The left ventricle has normal function. The left ventricle has no regional wall motion abnormalities. The left ventricular internal cavity size was small. There is mild left ventricular hypertrophy. Indeterminate diastolic filling due to E-A fusion. Right Ventricle: The right ventricular size is not well visualized. Right vetricular wall thickness was not well visualized. Right ventricular systolic function was not well visualized. Left Atrium: Left atrial size was normal in size. Right Atrium: Right atrial size was not well visualized. Pericardium: There is no evidence of pericardial effusion. Mitral Valve: The mitral valve is normal in structure. Trivial mitral valve regurgitation. No evidence of mitral valve stenosis. Tricuspid Valve: The tricuspid valve is normal in structure. Tricuspid valve regurgitation is trivial. No evidence of tricuspid stenosis. Aortic Valve: The aortic valve was not well visualized. Aortic valve regurgitation is not visualized. No aortic stenosis is present. Pulmonic Valve: The pulmonic valve was normal in structure. Pulmonic valve regurgitation is trivial. No evidence of pulmonic stenosis. Aorta: The aortic root is normal in size and structure. Venous: IVC assessment for right atrial pressure unable to be performed due  to mechanical ventilation. IAS/Shunts: The interatrial septum was not well visualized.  LEFT VENTRICLE PLAX 2D LVIDd:         3.60 cm LVIDs:         2.40 cm LV PW:         1.10 cm LV IVS:        1.10 cm LVOT diam:     2.10 cm LV SV:         53 LV SV Index:   23 LVOT Area:     3.46 cm  RIGHT VENTRICLE TAPSE (M-mode): 2.2 cm LEFT ATRIUM           Index LA diam:      3.20 cm 1.38 cm/m LA Vol (A2C): 21.7 ml 9.38 ml/m LA Vol (A4C): 20.6 ml 8.91 ml/m  AORTIC VALVE LVOT Vmax:   95.00 cm/s LVOT Vmean:  55.500 cm/s LVOT VTI:    0.152 m  AORTA Ao Root diam: 3.10 cm  SHUNTS Systemic VTI:  0.15 m Systemic Diam: 2.10 cm Oswaldo Milian MD Electronically signed by Oswaldo Milian MD Signature Date/Time: 06/19/2022/10:00:17 AM    Final    DG Chest Port 1 View  Result Date: 06/19/2022 CLINICAL DATA:  Found unresponsive.  Hypothermia on ventilator EXAM: PORTABLE CHEST 1 VIEW COMPARISON:  Yesterday FINDINGS: Endotracheal tube with tip just below the clavicular heads. The enteric tube reaches the stomach at least. Left IJ line with tip at the upper cavoatrial junction. Reinflated right lower lobe. Symmetric clear lungs. Normal heart size. IMPRESSION: 1. Reinflated right lower lobe.  Symmetric normal aeration. 2. Unremarkable hardware. Electronically Signed   By: Jorje Guild M.D.   On: 06/19/2022 06:28   DG CHEST PORT 1 VIEW  Result Date: 06/18/2022 CLINICAL DATA:  Encounter for central line placement. Orogastric tube adjustment. EXAM: PORTABLE CHEST 1 VIEW COMPARISON:  AP chest 06/18/2022 at 1140 hours, 10/02/2021 FINDINGS: AP chest 06/18/2022 at 3:07 p.m. Endotracheal tube tip terminates approximately 5-6 cm above the carina, at  the inferior aspect of the clavicular heads. Enteric tube side port and tip overlying the left upper abdominal quadrant, similar to prior. New left internal jugular central venous catheter tip extends at least to the superior vena cava/right atrial junction and may be minimally  within the superior right atrium by up to approximately 1.5 cm. Cardiac silhouette and mediastinal contours are within normal limits. There is again mild elevation the right hemidiaphragm, unchanged from radiograph earlier today but new from 10/02/2021. Right basilar heterogeneous airspace opacities similar to prior. The left lung is clear. No pleural effusion or pneumothorax. No acute skeletal abnormality. IMPRESSION: 1. New left internal jugular central venous catheter tip extends at least to the superior vena cava/right atrial junction and may be minimally within the superior right atrium by up to approximately 1.5 cm. 2. Endotracheal tube and enteric tube are in appropriate position. 3. Unchanged right lower lung volume loss and airspace opacity. Electronically Signed   By: Neita Garnet M.D.   On: 06/18/2022 15:17   EEG adult  Result Date: 06/18/2022 Jefferson Fuel, MD     06/18/2022  2:48 PM Routine EEG Report Mylan Danese Brossart is a 32 y.o. male with a history of seizure who is undergoing an EEG to evaluate for seizures. Report: This EEG was acquired with electrodes placed according to the International 10-20 electrode system (including Fp1, Fp2, F3, F4, C3, C4, P3, P4, O1, O2, T3, T4, T5, T6, A1, A2, Fz, Cz, Pz). The following electrodes were missing or displaced: none. The best background was approx 4 Hz. This activity is reactive to stimulation. Sleep architecture was not identified. There was no focal slowing. There were no interictal epileptiform discharges. There were no electrographic seizures identified. Photic stimulation and hyperventilation were not performed. Impression and clinical correlation: This EEG was obtained while comatose and is abnormal due to severe diffuse slowing indicative of global cerebral dysfunction. Epileptiform abnormalities were not seen during this recording. Bing Neighbors, MD [facility] [phone] If 7pm- 7am, please page neurology on call as listed  in AMION.   CT Head Wo Contrast  Result Date: 06/18/2022 CLINICAL DATA:  Provided history: Mental status change, unknown cause. Unresponsive. EXAM: CT HEAD WITHOUT CONTRAST TECHNIQUE: Contiguous axial images were obtained from the base of the skull through the vertex without intravenous contrast. RADIATION DOSE REDUCTION: This exam was performed according to the departmental dose-optimization program which includes automated exposure control, adjustment of the mA and/or kV according to patient size and/or use of iterative reconstruction technique. COMPARISON:  Prior head CT examinations 06/17/2022 and earlier. FINDINGS: Brain: There is no acute intracranial hemorrhage. No demarcated cortical infarct. No extra-axial fluid collection. No evidence of an intracranial mass. No midline shift. Vascular: No hyperdense vessel. Skull: No fracture or aggressive osseous lesion. Sinuses/Orbits: No mass or acute finding within the imaged orbits. Small-volume frothy secretions, and mild mucosal thickening, within the left frontal sinus. Scattered mucosal thickening within the bilateral ethmoid air cells, overall mild-to-moderate in severity. Mild mucosal thickening within the bilateral sphenoid sinuses. Trace secretions also present within the left sphenoid sinus. IMPRESSION: No evidence of acute intracranial abnormality. Paranasal sinus disease, as described. Electronically Signed   By: Jackey Loge D.O.   On: 06/18/2022 12:31      Subjective:  No significant events overnight, he denies any complaints, he is eager to go home. Discharge Exam: Vitals:   07/18/22 0902 07/18/22 1032  BP: (!) 146/95 128/77  Pulse: 80   Resp: 16   Temp: 97.7  F (36.5 C)   SpO2:     Vitals:   07/18/22 0437 07/18/22 0507 07/18/22 0902 07/18/22 1032  BP:  138/84 (!) 146/95 128/77  Pulse:  87 80   Resp:  16 16   Temp:  98.2 F (36.8 C) 97.7 F (36.5 C)   TempSrc:  Oral Oral   SpO2:  98%    Weight: 98.2 kg       General:  Pt is alert, awake, not in acute distress Cardiovascular: RRR, S1/S2 +, no rubs, no gallops Respiratory: CTA bilaterally, no wheezing, no rhonchi Abdominal: Soft, NT, ND, bowel sounds + Extremities: no edema, no cyanosis, bilateral toes bandaged    The results of significant diagnostics from this hospitalization (including imaging, microbiology, ancillary and laboratory) are listed below for reference.     Microbiology: Recent Results (from the past 240 hour(s))  Culture, blood (routine x 2)     Status: None   Collection Time: 07/12/22  2:26 PM   Specimen: BLOOD  Result Value Ref Range Status   Specimen Description BLOOD SITE NOT SPECIFIED  Final   Special Requests   Final    BOTTLES DRAWN AEROBIC AND ANAEROBIC Blood Culture adequate volume   Culture   Final    NO GROWTH 5 DAYS Performed at [facility], [address] [place], [place] [address]    Report Status 07/17/2022 FINAL  Final  Resp panel by RT-PCR (RSV, Flu A&B, Covid) Anterior Nasal Swab     Status: None   Collection Time: 07/12/22  2:30 PM   Specimen: Anterior Nasal Swab  Result Value Ref Range Status   SARS Coronavirus 2 by RT PCR NEGATIVE NEGATIVE Final    Comment: (NOTE) SARS-CoV-2 target nucleic acids are NOT DETECTED.  The SARS-CoV-2 RNA is generally detectable in upper respiratory specimens during the acute phase of infection. The lowest concentration of SARS-CoV-2 viral copies this assay can detect is 138 copies/mL. A negative result does not preclude SARS-Cov-2 infection and should not be used as the sole basis for treatment or other patient management decisions. A negative result may occur with  improper specimen collection/handling, submission of specimen other than nasopharyngeal swab, presence of viral mutation(s) within the areas targeted by this assay, and inadequate number of viral copies(<138 copies/mL). A negative result must be combined with clinical observations, patient history, and  epidemiological information. The expected result is Negative.  Fact Sheet for Patients:  BloggerCourse.comhttps://www.fda.gov/media/152166/download  Fact Sheet for Healthcare Providers:  SeriousBroker.ithttps://www.fda.gov/media/152162/download  This test is no t yet approved or cleared by the [place] FDA and  has been authorized for detection and/or diagnosis of SARS-CoV-2 by FDA under an Emergency Use Authorization (EUA). This EUA will remain  in effect (meaning this test can be used) for the duration of the COVID-19 declaration under Section 564(b)(1) of the Act, 21 U.S.C.section 360bbb-3(b)(1), unless the authorization is terminated  or revoked sooner.       Influenza A by PCR NEGATIVE NEGATIVE Final   Influenza B by PCR NEGATIVE NEGATIVE Final    Comment: (NOTE) The Xpert Xpress SARS-CoV-2/FLU/RSV plus assay is intended as an aid in the diagnosis of influenza from Nasopharyngeal swab specimens and should not be used as a sole basis for treatment. Nasal washings and aspirates are unacceptable for Xpert Xpress SARS-CoV-2/FLU/RSV testing.  Fact Sheet for Patients: BloggerCourse.comhttps://www.fda.gov/media/152166/download  Fact Sheet for Healthcare Providers: SeriousBroker.ithttps://www.fda.gov/media/152162/download  This test is not yet approved or cleared by the [place] FDA and has been authorized  for detection and/or diagnosis of SARS-CoV-2 by FDA under an Emergency Use Authorization (EUA). This EUA will remain in effect (meaning this test can be used) for the duration of the COVID-19 declaration under Section 564(b)(1) of the Act, 21 U.S.C. section 360bbb-3(b)(1), unless the authorization is terminated or revoked.     Resp Syncytial Virus by PCR NEGATIVE NEGATIVE Final    Comment: (NOTE) Fact Sheet for Patients: BloggerCourse.com  Fact Sheet for Healthcare Providers: SeriousBroker.it  This test is not yet approved or cleared by the [place] FDA and has been  authorized for detection and/or diagnosis of SARS-CoV-2 by FDA under an Emergency Use Authorization (EUA). This EUA will remain in effect (meaning this test can be used) for the duration of the COVID-19 declaration under Section 564(b)(1) of the Act, 21 U.S.C. section 360bbb-3(b)(1), unless the authorization is terminated or revoked.  Performed at [facility], [address], [place], [place] [address]   Culture, blood (routine x 2)     Status: None   Collection Time: 07/12/22  2:31 PM   Specimen: BLOOD  Result Value Ref Range Status   Specimen Description BLOOD BLOOD RIGHT HAND  Final   Special Requests AEROBIC BOTTLE ONLY Blood Culture adequate volume  Final   Culture   Final    NO GROWTH 5 DAYS Performed at [facility], [address] [place], [place] [address]    Report Status 07/17/2022 FINAL  Final  MRSA Next Gen by PCR, Nasal     Status: None   Collection Time: 07/12/22  5:49 PM   Specimen: Nasal Mucosa; Nasal Swab  Result Value Ref Range Status   MRSA by PCR Next Gen NOT DETECTED NOT DETECTED Final    Comment: (NOTE) The GeneXpert MRSA Assay (FDA approved for NASAL specimens only), is one component of a comprehensive MRSA colonization surveillance program. It is not intended to diagnose MRSA infection nor to guide or monitor treatment for MRSA infections. Test performance is not FDA approved in patients less than 8 years old. Performed at [facility], [address] [place], [place] [address]   C Difficile Quick Screen w PCR reflex     Status: None   Collection Time: 07/14/22  2:41 PM  Result Value Ref Range Status   C Diff antigen NEGATIVE NEGATIVE Final   C Diff toxin NEGATIVE NEGATIVE Final   C Diff interpretation No C. difficile detected.  Final    Comment: Performed at [facility], [address] [place], [place] [address]     Labs: BNP (last 3 results) No results for input(s): "BNP" in the last 8760 hours. Basic Metabolic  Panel: Recent Labs  Lab 07/12/22 1621 07/12/22 2002 07/13/22 0414 07/13/22 2956 07/14/22 0440 07/14/22 0842 07/14/22 2141 07/15/22 2130 07/16/22 0451 07/17/22 0638 07/18/22 0159  NA 121*   < >  --    < > 126*   < > 128* 131* 136 135 137  K 3.0*   < >  --    < > 3.0*  --   --  3.6 3.2* 3.2* 3.2*  CL 66*   < >  --    < > 84*  --   --  92* 100 103 106  CO2 23   < >  --    < > 25  --   --  27 25 22 22   GLUCOSE 86   < >  --    < >  87  --   --  93 95 91 100*  BUN 32*   < >  --    < > 32*  --   --  13 8 5* <5*  CREATININE 5.27*   < >  --    < > 1.71*  --   --  0.88 0.74 0.67 0.67  CALCIUM 8.0*   < >  --    < > 8.1*  --   --  8.5* 8.3* 8.2* 8.1*  MG 3.5*  --  2.5*  --   --   --   --   --  1.4* 1.4* 1.9  PHOS 9.3*  --  7.5*  --   --   --   --   --  2.1* 2.4*  --    < > = values in this interval not displayed.   Liver Function Tests: Recent Labs  Lab 07/12/22 1621  AST 99*  ALT 24  ALKPHOS 70  BILITOT 1.1  PROT 6.2*  ALBUMIN 3.1*   No results for input(s): "LIPASE", "AMYLASE" in the last 168 hours. No results for input(s): "AMMONIA" in the last 168 hours. CBC: Recent Labs  Lab 07/12/22 1432 07/12/22 1441 07/13/22 0414 07/14/22 0440 07/15/22 0642 07/16/22 0451 07/17/22 0638 07/18/22 0159  WBC 33.5*   < > 24.9* 20.4* 15.0* 10.0 8.4 6.9  NEUTROABS 29.4*  --  21.9*  --   --   --   --   --   HGB 14.0   < > 10.8* 10.2* 9.9* 9.5* 9.7* 9.7*  HCT 40.4   < > 30.2* 29.3* 28.7* 29.2* 28.8* 29.2*  MCV 94.8   < > 91.8 94.5 95.3 99.7 99.0 100.3*  PLT 267   < > 176 174 194 221 248 269   < > = values in this interval not displayed.   Cardiac Enzymes: No results for input(s): "CKTOTAL", "CKMB", "CKMBINDEX", "TROPONINI" in the last 168 hours. BNP: Invalid input(s): "POCBNP" CBG: Recent Labs  Lab 07/17/22 0745 07/17/22 1216 07/17/22 1646 07/17/22 2117 07/18/22 0910  GLUCAP 95 98 106* 125* 116*   D-Dimer No results for input(s): "DDIMER" in the last 72 hours. Hgb A1c No  results for input(s): "HGBA1C" in the last 72 hours. Lipid Profile No results for input(s): "CHOL", "HDL", "LDLCALC", "TRIG", "CHOLHDL", "LDLDIRECT" in the last 72 hours. Thyroid function studies No results for input(s): "TSH", "T4TOTAL", "T3FREE", "THYROIDAB" in the last 72 hours.  Invalid input(s): "FREET3" Anemia work up No results for input(s): "VITAMINB12", "FOLATE", "FERRITIN", "TIBC", "IRON", "RETICCTPCT" in the last 72 hours. Urinalysis    Component Value Date/Time   COLORURINE AMBER (A) 07/12/2022 1424   APPEARANCEUR CLOUDY (A) 07/12/2022 1424   LABSPEC 1.022 07/12/2022 1424   PHURINE 5.0 07/12/2022 1424   GLUCOSEU 50 (A) 07/12/2022 1424   HGBUR LARGE (A) 07/12/2022 1424   BILIRUBINUR NEGATIVE 07/12/2022 1424   KETONESUR NEGATIVE 07/12/2022 1424   PROTEINUR >=300 (A) 07/12/2022 1424   NITRITE NEGATIVE 07/12/2022 1424   LEUKOCYTESUR NEGATIVE 07/12/2022 1424   Sepsis Labs Recent Labs  Lab 07/15/22 0642 07/16/22 0451 07/17/22 0638 07/18/22 0159  WBC 15.0* 10.0 8.4 6.9   Microbiology Recent Results (from the past 240 hour(s))  Culture, blood (routine x 2)     Status: None   Collection Time: 07/12/22  2:26 PM   Specimen: BLOOD  Result Value Ref Range Status   Specimen Description BLOOD SITE NOT SPECIFIED  Final   Special Requests  Final    BOTTLES DRAWN AEROBIC AND ANAEROBIC Blood Culture adequate volume   Culture   Final    NO GROWTH 5 DAYS Performed at [facility], [address], [place], [place] [address]    Report Status 07/17/2022 FINAL  Final  Resp panel by RT-PCR (RSV, Flu A&B, Covid) Anterior Nasal Swab     Status: None   Collection Time: 07/12/22  2:30 PM   Specimen: Anterior Nasal Swab  Result Value Ref Range Status   SARS Coronavirus 2 by RT PCR NEGATIVE NEGATIVE Final    Comment: (NOTE) SARS-CoV-2 target nucleic acids are NOT DETECTED.  The SARS-CoV-2 RNA is generally detectable in upper respiratory specimens during the acute phase of  infection. The lowest concentration of SARS-CoV-2 viral copies this assay can detect is 138 copies/mL. A negative result does not preclude SARS-Cov-2 infection and should not be used as the sole basis for treatment or other patient management decisions. A negative result may occur with  improper specimen collection/handling, submission of specimen other than nasopharyngeal swab, presence of viral mutation(s) within the areas targeted by this assay, and inadequate number of viral copies(<138 copies/mL). A negative result must be combined with clinical observations, patient history, and epidemiological information. The expected result is Negative.  Fact Sheet for Patients:  BloggerCourse.com  Fact Sheet for Healthcare Providers:  SeriousBroker.it  This test is no t yet approved or cleared by the [place] FDA and  has been authorized for detection and/or diagnosis of SARS-CoV-2 by FDA under an Emergency Use Authorization (EUA). This EUA will remain  in effect (meaning this test can be used) for the duration of the COVID-19 declaration under Section 564(b)(1) of the Act, 21 U.S.C.section 360bbb-3(b)(1), unless the authorization is terminated  or revoked sooner.       Influenza A by PCR NEGATIVE NEGATIVE Final   Influenza B by PCR NEGATIVE NEGATIVE Final    Comment: (NOTE) The Xpert Xpress SARS-CoV-2/FLU/RSV plus assay is intended as an aid in the diagnosis of influenza from Nasopharyngeal swab specimens and should not be used as a sole basis for treatment. Nasal washings and aspirates are unacceptable for Xpert Xpress SARS-CoV-2/FLU/RSV testing.  Fact Sheet for Patients: BloggerCourse.com  Fact Sheet for Healthcare Providers: SeriousBroker.it  This test is not yet approved or cleared by the [place] FDA and has been authorized for detection and/or diagnosis of SARS-CoV-2  by FDA under an Emergency Use Authorization (EUA). This EUA will remain in effect (meaning this test can be used) for the duration of the COVID-19 declaration under Section 564(b)(1) of the Act, 21 U.S.C. section 360bbb-3(b)(1), unless the authorization is terminated or revoked.     Resp Syncytial Virus by PCR NEGATIVE NEGATIVE Final    Comment: (NOTE) Fact Sheet for Patients: BloggerCourse.com  Fact Sheet for Healthcare Providers: SeriousBroker.it  This test is not yet approved or cleared by the [place] FDA and has been authorized for detection and/or diagnosis of SARS-CoV-2 by FDA under an Emergency Use Authorization (EUA). This EUA will remain in effect (meaning this test can be used) for the duration of the COVID-19 declaration under Section 564(b)(1) of the Act, 21 U.S.C. section 360bbb-3(b)(1), unless the authorization is terminated or revoked.  Performed at [facility], [address] [place], [place] [address]   Culture, blood (routine x 2)     Status: None   Collection Time: 07/12/22  2:31 PM   Specimen: BLOOD  Result Value Ref Range Status  Specimen Description BLOOD BLOOD RIGHT HAND  Final   Special Requests AEROBIC BOTTLE ONLY Blood Culture adequate volume  Final   Culture   Final    NO GROWTH 5 DAYS Performed at [facility], [address], [place], [place] [address]    Report Status 07/17/2022 FINAL  Final  MRSA Next Gen by PCR, Nasal     Status: None   Collection Time: 07/12/22  5:49 PM   Specimen: Nasal Mucosa; Nasal Swab  Result Value Ref Range Status   MRSA by PCR Next Gen NOT DETECTED NOT DETECTED Final    Comment: (NOTE) The GeneXpert MRSA Assay (FDA approved for NASAL specimens only), is one component of a comprehensive MRSA colonization surveillance program. It is not intended to diagnose MRSA infection nor to guide or monitor treatment for MRSA infections. Test performance is  not FDA approved in patients less than 3 years old. Performed at [facility], [place] [address] [place], [place] [address]   C Difficile Quick Screen w PCR reflex     Status: None   Collection Time: 07/14/22  2:41 PM  Result Value Ref Range Status   C Diff antigen NEGATIVE NEGATIVE Final   C Diff toxin NEGATIVE NEGATIVE Final   C Diff interpretation No C. difficile detected.  Final    Comment: Performed at [facility], [place] [address], [place], [place] [address]     Time coordinating discharge: Over 30 minutes  SIGNED:   Phillips Climes, MD  [facility] 07/18/2022, 12:15 PM Pager   If 7PM-7AM, please contact night-coverage [email or web address]

## 2022-07-27 ENCOUNTER — Other Ambulatory Visit: Payer: Self-pay

## 2022-07-27 ENCOUNTER — Encounter (HOSPITAL_COMMUNITY): Payer: Self-pay | Admitting: *Deleted

## 2022-07-27 ENCOUNTER — Inpatient Hospital Stay (HOSPITAL_COMMUNITY)
Admission: EM | Admit: 2022-07-27 | Discharge: 2022-07-30 | DRG: 897 | Disposition: A | Discharge status: Home / Self Care | Payer: Medicaid Other | Attending: Internal Medicine | Admitting: Internal Medicine

## 2022-07-27 ENCOUNTER — Emergency Department (HOSPITAL_COMMUNITY): Payer: Medicaid Other

## 2022-07-27 DIAGNOSIS — G4089 Other seizures: Secondary | ICD-10-CM | POA: Diagnosis present

## 2022-07-27 DIAGNOSIS — Z8249 Family history of ischemic heart disease and other diseases of the circulatory system: Secondary | ICD-10-CM | POA: Diagnosis not present

## 2022-07-27 DIAGNOSIS — Z635 Disruption of family by separation and divorce: Secondary | ICD-10-CM | POA: Diagnosis not present

## 2022-07-27 DIAGNOSIS — E669 Obesity, unspecified: Secondary | ICD-10-CM | POA: Diagnosis present

## 2022-07-27 DIAGNOSIS — L97518 Non-pressure chronic ulcer of other part of right foot with other specified severity: Secondary | ICD-10-CM | POA: Diagnosis present

## 2022-07-27 DIAGNOSIS — L97418 Non-pressure chronic ulcer of right heel and midfoot with other specified severity: Secondary | ICD-10-CM | POA: Diagnosis present

## 2022-07-27 DIAGNOSIS — R569 Unspecified convulsions: Secondary | ICD-10-CM

## 2022-07-27 DIAGNOSIS — F10930 Alcohol use, unspecified with withdrawal, uncomplicated: Secondary | ICD-10-CM

## 2022-07-27 DIAGNOSIS — Y909 Presence of alcohol in blood, level not specified: Secondary | ICD-10-CM | POA: Diagnosis present

## 2022-07-27 DIAGNOSIS — L089 Local infection of the skin and subcutaneous tissue, unspecified: Secondary | ICD-10-CM | POA: Diagnosis present

## 2022-07-27 DIAGNOSIS — Z6828 Body mass index (BMI) 28.0-28.9, adult: Secondary | ICD-10-CM

## 2022-07-27 DIAGNOSIS — K7010 Alcoholic hepatitis without ascites: Secondary | ICD-10-CM | POA: Diagnosis present

## 2022-07-27 DIAGNOSIS — F10921 Alcohol use, unspecified with intoxication delirium: Secondary | ICD-10-CM

## 2022-07-27 DIAGNOSIS — Z1152 Encounter for screening for COVID-19: Secondary | ICD-10-CM

## 2022-07-27 DIAGNOSIS — F10220 Alcohol dependence with intoxication, uncomplicated: Secondary | ICD-10-CM | POA: Diagnosis present

## 2022-07-27 DIAGNOSIS — F10920 Alcohol use, unspecified with intoxication, uncomplicated: Secondary | ICD-10-CM

## 2022-07-27 DIAGNOSIS — F10231 Alcohol dependence with withdrawal delirium: Principal | ICD-10-CM | POA: Diagnosis present

## 2022-07-27 DIAGNOSIS — Z7901 Long term (current) use of anticoagulants: Secondary | ICD-10-CM

## 2022-07-27 DIAGNOSIS — Z86711 Personal history of pulmonary embolism: Secondary | ICD-10-CM

## 2022-07-27 DIAGNOSIS — F10939 Alcohol use, unspecified with withdrawal, unspecified: Secondary | ICD-10-CM | POA: Diagnosis not present

## 2022-07-27 DIAGNOSIS — Z79899 Other long term (current) drug therapy: Secondary | ICD-10-CM

## 2022-07-27 DIAGNOSIS — I96 Gangrene, not elsewhere classified: Secondary | ICD-10-CM | POA: Diagnosis present

## 2022-07-27 DIAGNOSIS — F10929 Alcohol use, unspecified with intoxication, unspecified: Secondary | ICD-10-CM | POA: Diagnosis present

## 2022-07-27 DIAGNOSIS — F10931 Alcohol use, unspecified with withdrawal delirium: Secondary | ICD-10-CM

## 2022-07-27 DIAGNOSIS — I10 Essential (primary) hypertension: Secondary | ICD-10-CM | POA: Diagnosis present

## 2022-07-27 DIAGNOSIS — L97528 Non-pressure chronic ulcer of other part of left foot with other specified severity: Secondary | ICD-10-CM | POA: Diagnosis present

## 2022-07-27 DIAGNOSIS — R112 Nausea with vomiting, unspecified: Secondary | ICD-10-CM | POA: Diagnosis present

## 2022-07-27 DIAGNOSIS — E876 Hypokalemia: Secondary | ICD-10-CM | POA: Diagnosis present

## 2022-07-27 DIAGNOSIS — I739 Peripheral vascular disease, unspecified: Secondary | ICD-10-CM | POA: Diagnosis not present

## 2022-07-27 DIAGNOSIS — F1010 Alcohol abuse, uncomplicated: Secondary | ICD-10-CM

## 2022-07-27 LAB — COMPREHENSIVE METABOLIC PANEL
ALT: 24 U/L (ref 0–44)
AST: 31 U/L (ref 15–41)
Albumin: 3.5 g/dL (ref 3.5–5.0)
Alkaline Phosphatase: 67 U/L (ref 38–126)
Anion gap: 17 — ABNORMAL HIGH (ref 5–15)
BUN: <5 mg/dL — ABNORMAL LOW (ref 6–20)
CO2: 25 mmol/L (ref 22–32)
Calcium: 9 mg/dL (ref 8.9–10.3)
Chloride: 97 mmol/L — ABNORMAL LOW (ref 98–111)
Creatinine, Ser: 0.72 mg/dL (ref 0.61–1.24)
GFR, Estimated: >60 mL/min (ref >60)
Glucose, Bld: 127 mg/dL — ABNORMAL HIGH (ref 70–99)
Potassium: 3.4 mmol/L — ABNORMAL LOW (ref 3.5–5.1)
Sodium: 139 mmol/L (ref 135–145)
Total Bilirubin: 0.5 mg/dL (ref 0.3–1.2)
Total Protein: 7.8 g/dL (ref 6.5–8.1)

## 2022-07-27 LAB — CBC
HCT: 34.4 % — ABNORMAL LOW (ref 39.0–52.0)
Hemoglobin: 11.7 g/dL — ABNORMAL LOW (ref 13.0–17.0)
MCH: 32.6 pg (ref 26.0–34.0)
MCHC: 34 g/dL (ref 30.0–36.0)
MCV: 95.8 fL (ref 80.0–100.0)
Platelets: 483 10*3/uL — ABNORMAL HIGH (ref 150–400)
RBC: 3.59 MIL/uL — ABNORMAL LOW (ref 4.22–5.81)
RDW: 12.9 % (ref 11.5–15.5)
WBC: 9.5 10*3/uL (ref 4.0–10.5)
nRBC: 0 % (ref 0.0–0.2)

## 2022-07-27 LAB — RESP PANEL BY RT-PCR (RSV, FLU A&B, COVID)  RVPGX2
Influenza A by PCR: NEGATIVE
Influenza B by PCR: NEGATIVE
Resp Syncytial Virus by PCR: NEGATIVE
SARS Coronavirus 2 by RT PCR: NEGATIVE

## 2022-07-27 LAB — ETHANOL: Alcohol, Ethyl (B): 164 mg/dL — ABNORMAL HIGH (ref <10)

## 2022-07-27 LAB — LACTIC ACID, PLASMA: Lactic Acid, Venous: 1.1 mmol/L (ref 0.5–1.9)

## 2022-07-27 LAB — LIPASE, BLOOD: Lipase: 34 U/L (ref 11–51)

## 2022-07-27 LAB — MAGNESIUM: Magnesium: 1.8 mg/dL (ref 1.7–2.4)

## 2022-07-27 LAB — GROUP A STREP BY PCR: Group A Strep by PCR: NOT DETECTED

## 2022-07-27 MED ORDER — LACTATED RINGERS IV BOLUS
1000.0000 mL | Freq: Once | INTRAVENOUS | Status: AC
  Administered 2022-07-27: 1000 mL via INTRAVENOUS

## 2022-07-27 MED ORDER — LORAZEPAM 1 MG PO TABS
0.0000 mg | ORAL_TABLET | Freq: Four times a day (QID) | ORAL | Status: AC
  Administered 2022-07-28 (×2): 1 mg via ORAL
  Filled 2022-07-27 (×2): qty 1

## 2022-07-27 MED ORDER — LORAZEPAM 2 MG/ML IJ SOLN
1.0000 mg | INTRAMUSCULAR | Status: DC | PRN
  Administered 2022-07-27: 2 mg via INTRAVENOUS
  Filled 2022-07-27: qty 1

## 2022-07-27 MED ORDER — METOPROLOL TARTRATE 12.5 MG HALF TABLET
12.5000 mg | ORAL_TABLET | Freq: Two times a day (BID) | ORAL | Status: DC
  Administered 2022-07-27 – 2022-07-30 (×6): 12.5 mg via ORAL
  Filled 2022-07-27 (×6): qty 1

## 2022-07-27 MED ORDER — ONDANSETRON HCL 4 MG/2ML IJ SOLN
4.0000 mg | Freq: Once | INTRAMUSCULAR | Status: AC
  Administered 2022-07-27: 4 mg via INTRAVENOUS
  Filled 2022-07-27: qty 2

## 2022-07-27 MED ORDER — THIAMINE HCL 100 MG/ML IJ SOLN: 100.0000 mg | Freq: Every day | INTRAMUSCULAR | Status: DC

## 2022-07-27 MED ORDER — FOLIC ACID 1 MG PO TABS
1.0000 mg | ORAL_TABLET | Freq: Every day | ORAL | Status: DC
  Administered 2022-07-27 – 2022-07-30 (×4): 1 mg via ORAL
  Filled 2022-07-27 (×4): qty 1

## 2022-07-27 MED ORDER — FOLIC ACID 1 MG PO TABS: 1.0000 mg | ORAL_TABLET | Freq: Every day | ORAL | Status: DC

## 2022-07-27 MED ORDER — POTASSIUM CHLORIDE 10 MEQ/100ML IV SOLN
10.0000 meq | INTRAVENOUS | Status: AC
  Administered 2022-07-27 – 2022-07-28 (×6): 10 meq via INTRAVENOUS
  Filled 2022-07-27 (×6): qty 100

## 2022-07-27 MED ORDER — LEVETIRACETAM IN NACL 500 MG/100ML IV SOLN
500.0000 mg | Freq: Two times a day (BID) | INTRAVENOUS | Status: DC
  Administered 2022-07-27 – 2022-07-29 (×4): 500 mg via INTRAVENOUS
  Filled 2022-07-27 (×4): qty 100

## 2022-07-27 MED ORDER — LORAZEPAM 1 MG PO TABS
0.0000 mg | ORAL_TABLET | Freq: Two times a day (BID) | ORAL | Status: DC
  Administered 2022-07-30: 1 mg via ORAL
  Filled 2022-07-27: qty 1

## 2022-07-27 MED ORDER — THIAMINE MONONITRATE 100 MG PO TABS
100.0000 mg | ORAL_TABLET | Freq: Every day | ORAL | Status: DC
  Administered 2022-07-29 – 2022-07-30 (×2): 100 mg via ORAL
  Filled 2022-07-27 (×3): qty 1

## 2022-07-27 MED ORDER — VANCOMYCIN HCL 1500 MG/300ML IV SOLN
1500.0000 mg | Freq: Once | INTRAVENOUS | Status: AC
  Administered 2022-07-27: 1500 mg via INTRAVENOUS
  Filled 2022-07-27: qty 300

## 2022-07-27 MED ORDER — LISINOPRIL 20 MG PO TABS
20.0000 mg | ORAL_TABLET | Freq: Every day | ORAL | Status: DC
  Administered 2022-07-27 – 2022-07-30 (×4): 20 mg via ORAL
  Filled 2022-07-27: qty 1
  Filled 2022-07-27 (×2): qty 2
  Filled 2022-07-27: qty 1

## 2022-07-27 MED ORDER — ADULT MULTIVITAMIN W/MINERALS CH
1.0000 | ORAL_TABLET | Freq: Once | ORAL | Status: AC
  Administered 2022-07-27: 1 via ORAL
  Filled 2022-07-27: qty 1

## 2022-07-27 MED ORDER — PIPERACILLIN-TAZOBACTAM 3.375 G IVPB 30 MIN
3.3750 g | Freq: Once | INTRAVENOUS | Status: AC
  Administered 2022-07-27: 3.375 g via INTRAVENOUS
  Filled 2022-07-27: qty 50

## 2022-07-27 MED ORDER — ADULT MULTIVITAMIN W/MINERALS CH
1.0000 | ORAL_TABLET | Freq: Every day | ORAL | Status: DC
  Administered 2022-07-28 – 2022-07-30 (×3): 1 via ORAL
  Filled 2022-07-27 (×3): qty 1

## 2022-07-27 MED ORDER — LORAZEPAM 1 MG PO TABS: 1.0000 mg | ORAL_TABLET | ORAL | Status: DC | PRN

## 2022-07-27 MED ORDER — ACETAMINOPHEN 500 MG PO TABS
1000.0000 mg | ORAL_TABLET | Freq: Once | ORAL | Status: AC
  Administered 2022-07-27: 1000 mg via ORAL
  Filled 2022-07-27: qty 2

## 2022-07-27 MED ORDER — THIAMINE MONONITRATE 100 MG PO TABS
100.0000 mg | ORAL_TABLET | Freq: Every day | ORAL | Status: DC
  Administered 2022-07-27: 100 mg via ORAL
  Filled 2022-07-27: qty 1

## 2022-07-27 MED ORDER — LORAZEPAM 2 MG/ML IJ SOLN: 0.0000 mg | Freq: Two times a day (BID) | INTRAMUSCULAR | Status: DC

## 2022-07-27 MED ORDER — LORAZEPAM 2 MG/ML IJ SOLN
0.0000 mg | Freq: Four times a day (QID) | INTRAMUSCULAR | Status: AC
  Administered 2022-07-27: 2 mg via INTRAVENOUS
  Administered 2022-07-27: 1 mg via INTRAVENOUS
  Administered 2022-07-27 – 2022-07-28 (×3): 2 mg via INTRAVENOUS
  Filled 2022-07-27 (×5): qty 1

## 2022-07-27 NOTE — Progress Notes (Signed)
A consult was received from an ED physician for vancomycin and Zosyn per pharmacy dosing (for an indication other than meningitis). [name] profile has been reviewed for ht/wt/allergies/indication/available labs. A one time order has been placed for the above antibiotics.  Further antibiotics/pharmacy consults should be ordered by admitting physician if indicated.                       Reuel Boom, PharmD, BCPS [phone] 07/27/2022, 2:58 PM

## 2022-07-27 NOTE — ED Notes (Signed)
Patient has open wounds to bil feet, great toes and pad of the foot,  the right foot has sores on the 2nd and 3rd toe.  Areas washed with NS and dry non stick dressing applied.  Patient states he has been taking his antibiotics at home for same.  He reports he walks a lot, he lives in an apartment but does not have a car.  MD took photos of the wounds prior to re-dressing.  Patient denies any pain.  [place] pulses are strong bil

## 2022-07-27 NOTE — H&P (Signed)
[facility] History and Physical  Brett Molina URK:[id number] DOB: [birth year] DOA: 07/27/2022  Referring physician:  PCP: Pcp, No   Chief Complaint: EtOH abuse, lower extremity gangrene  HPI:  32 y.o.BM PMHx EtOH abuse, hypertension.    Presents to ED for evaluation of intoxication.  The patient reports that ever since his birthday, on [birth year], he is been drinking 12 to 18 12 oz beers per day.  The patient states that he is going through a divorce which is causing him significant mental distress.  The patient reports that he does have a history of alcohol abuse, he has been admitted in the past for withdrawal.  Patient states that he has not had substantial food ever since 1/16 and has been subsisting on beer.  Patient denies liquor.  Patient denies any other drugs.  Patient states that ever since he started drinking on 1/16 he has had persistent nausea and vomiting every day.  Patient denies any fevers, abdominal pain, diarrhea. Patient denies any shortness of breath or chest pain. Patient is stating he has a sore throat however he attributes this to repetitive vomiting.   Patient also has secondary complaint of bilateral foot wounds.  Patient reports he recently was placed on antibiotics for his foot wounds however did not complete his antibiotics secondary to nausea and vomiting.  Patient reports that these foot wounds been present since his last admission    Review of Systems:  Covid vaccination;  Constitutional:  No weight loss, night sweats, positive fevers, positive chills, fatigue.  HEENT:  No headaches, Difficulty swallowing,Tooth/dental problems,Sore throat,  No sneezing, itching, ear ache, nasal congestion, post nasal drip,  Cardio-vascular:  No chest pain, Orthopnea, PND, swelling in lower extremities, anasarca, dizziness, palpitations  GI:  No heartburn, indigestion, abdominal pain, positivenausea, vomiting, negative diarrhea, change in bowel habits, loss of  appetite  Resp:  No shortness of breath with exertion or at rest. No excess mucus, no productive cough, No non-productive cough, No coughing up of blood.No change in color of mucus.No wheezing.No chest wall deformity  Skin:  no rash or lesions.  GU:  no dysuria, change in color of urine, no urgency or frequency. No flank pain.  Musculoskeletal:  positive joint pain or swelling. No decreased range of motion. No back pain.  Psych:  No change in mood or affect. No depression or anxiety. No memory loss.   Past Medical History:  Diagnosis Date   ETOH abuse    Hypertension    Past Surgical History:  Procedure Laterality Date   ESOPHAGOGASTRODUODENOSCOPY N/A 07/24/2018   Procedure: ESOPHAGOGASTRODUODENOSCOPY (EGD);  Surgeon: Milus Banister, MD;  Location: [facility];  Service: Endoscopy;  Laterality: N/A;   Social History:  reports that he has never smoked. He has never used smokeless tobacco. He reports current alcohol use of about 21.0 standard drinks of alcohol per week. He reports that he does not use drugs.  No Known Allergies  Family History  Problem Relation Age of Onset   Hypertension Mother    Hypertension Father      Prior to Admission medications   Medication Sig Start Date End Date Taking? Authorizing Provider  apixaban (ELIQUIS) 5 MG TABS tablet Take 1 tablet (5 mg total) by mouth 2 (two) times daily. 07/17/22  Yes Elgergawy, Silver Huguenin, MD  levETIRAcetam (KEPPRA) 500 MG tablet Take 1 tablet (500 mg total) by mouth 2 (two) times daily. 07/17/22  Yes Elgergawy, Silver Huguenin, MD  lisinopril (ZESTRIL) 20 MG tablet Take  20 mg by mouth daily. LF 06-24-22 # 30   Yes [provider]  metoprolol tartrate (LOPRESSOR) 25 MG tablet Take 1 tablet (25 mg total) by mouth 2 (two) times daily. 07/17/22  Yes Elgergawy, Leana Roe, MD  pantoprazole (PROTONIX) 40 MG tablet Take 1 tablet (40 mg total) by mouth daily. 07/17/22 08/16/22 Yes Elgergawy, Leana Roe, MD  amoxicillin-clavulanate  (AUGMENTIN) 875-125 MG tablet Take 1 tablet by mouth 2 (two) times daily. Patient not taking: Reported on 07/27/2022 07/17/22   Elgergawy, Leana Roe, MD     Consultants:    Procedures/Significant Events:    I have personally reviewed and interpreted all radiology studies and my findings are as above.   VENTILATOR SETTINGS:    Cultures 1/22 influenza A/B negative 1/22 RSV negative 1/22 SARS coronavirus negative    Antimicrobials: Anti-infectives (From admission, onward)    Start     Dose/Rate Route Frequency Ordered Stop   07/27/22 1500  vancomycin (VANCOREADY) IVPB 1500 mg/300 mL        1,500 mg 150 mL/hr over 120 Minutes Intravenous  Once 07/27/22 1458 07/27/22 1836   07/27/22 1500  piperacillin-tazobactam (ZOSYN) IVPB 3.375 g        3.375 g 100 mL/hr over 30 Minutes Intravenous  Once 07/27/22 1458 07/27/22 1616         Devices    LINES / TUBES:      Continuous Infusions:  vancomycin      Physical Exam: Vitals:   07/27/22 1345 07/27/22 1528 07/27/22 1534 07/27/22 1600  BP: (!) 161/101 (!) 148/103 (!) 159/99 (!) 157/98  Pulse: (!) 115 (!) 125 (!) 121 (!) 122  Resp: 20 18  (!) 31  Temp:  100.1 F (37.8 C)    TempSrc:  Oral    SpO2: 99% 99%  96%  Weight:      Height:        Wt Readings from Last 3 Encounters:  07/27/22 99.8 kg  07/18/22 98.2 kg  06/24/22 102.1 kg    General: A/O x 4, No acute respiratory distress Eyes: negative scleral hemorrhage, negative anisocoria, negative icterus ENT: Negative Runny nose, negative gingival bleeding, Neck:  Negative scars, masses, torticollis, lymphadenopathy, JVD Lungs: Clear to auscultation bilaterally without wheezes or crackles Cardiovascular: Regular rate and rhythm without murmur gallop or rub normal S1 and S2 Abdomen: negative abdominal pain, nondistended, positive soft, bowel sounds, no rebound, no ascites, no appreciable mass Extremities: see photos below Skin: See photos below Psychiatric:   Negative depression, negative anxiety, negative fatigue, negative mania  Central nervous system:  Cranial nerves II through XII intact, tongue/uvula midline, all extremities muscle strength 5/5, sensation decreased bilateral feet, negative dysarthria, negative expressive aphasia, negative receptive aphasia.                 Labs on Admission:  Basic Metabolic Panel: Recent Labs  Lab 07/27/22 1222  NA 139  K 3.4*  CL 97*  CO2 25  GLUCOSE 127*  BUN <5*  CREATININE 0.72  CALCIUM 9.0  MG 1.8   Liver Function Tests: Recent Labs  Lab 07/27/22 1222  AST 31  ALT 24  ALKPHOS 67  BILITOT 0.5  PROT 7.8  ALBUMIN 3.5   Recent Labs  Lab 07/27/22 1222  LIPASE 34   No results for input(s): "AMMONIA" in the last 168 hours. CBC: Recent Labs  Lab 07/27/22 1222  WBC 9.5  HGB 11.7*  HCT 34.4*  MCV 95.8  PLT 483*  Cardiac Enzymes: No results for input(s): "CKTOTAL", "CKMB", "CKMBINDEX", "TROPONINI" in the last 168 hours.  BNP (last 3 results) No results for input(s): "BNP" in the last 8760 hours.  ProBNP (last 3 results) No results for input(s): "PROBNP" in the last 8760 hours.  CBG: No results for input(s): "GLUCAP" in the last 168 hours.  Radiological Exams on Admission: DG Foot Complete Left  Result Date: 07/27/2022 CLINICAL DATA:  Wounds EXAM: LEFT FOOT - COMPLETE 3 VIEW COMPARISON:  None Available. FINDINGS: There is no evidence of fracture or dislocation. There is no evidence of arthropathy or other focal bone abnormality. Soft tissue swelling no especially of the great toe. IMPRESSION: Soft tissue swelling.  No osseous abnormalities identified. Electronically Signed   By: Sammie Bench M.D.   On: 07/27/2022 15:28   DG Foot Complete Right  Result Date: 07/27/2022 CLINICAL DATA:  Extensive pole wounds EXAM: RIGHT FOOT COMPLETE - 3+ VIEW COMPARISON:  None Available. FINDINGS: No fracture or malalignment. No periostitis or bone destruction. Negative for soft  tissue emphysema. Soft tissue ulcers or wounds at the tips of the first and second digits. IMPRESSION: No acute osseous abnormality. Electronically Signed   By: Donavan Foil M.D.   On: 07/27/2022 15:26   DG Chest 2 View  Result Date: 07/27/2022 CLINICAL DATA:  Concern for aspiration EXAM: CHEST - 2 VIEW COMPARISON:  Chest x-ray dated July 12, 2022 FINDINGS: The heart size and mediastinal contours are within normal limits. Both lungs are clear. The visualized skeletal structures are unremarkable. IMPRESSION: No active cardiopulmonary disease. Electronically Signed   By: Yetta Glassman M.D.   On: 07/27/2022 15:26    EKG: Independently reviewed.   Assessment/Plan Active Problems:   Acute alcohol intoxication ([place])   Alcohol withdrawal seizure with complication (HCC)   Alcoholic hepatitis without ascites   HTN (hypertension)   Alcohol abuse   Obesity (BMI 30-39.9)   Seizure ([place])   Gangrene ([place])  EtOH intoxication - CIWA protocol - If patient breakthrough CIWA protocol placed on Precedex goal RASS -2  EtOH withdrawal seizure with complication - See EtOH intoxication  EtOH abuse -Consult TOC for evaluation - See EtOH intoxication  Essential HTN - Lisinopril 20 mg daily - Metoprolol 12.5 mg BID (home dose 25, but given his current infection will start on lower dose)  Seizure -Keppra level pending - Keppra IV 500 mg BID  Gangrene bilateral feet and toes - Tmax 38.2 C - Continue current antibiotics - Lactic acid pending - In a.m. consult Dr. Sharol Given: Debridement vs amputation  Hx PE - Restart Eliquis 5 mg BID  Hypokalemia - Potassium goal> 4 - Potassium IV 60 mEq   Mobility Assessment (last 72 hours)     Mobility Assessment   No documentation.           Code Status: Full (DVT Prophylaxis: apixaban Family Communication:   Status is: Inpatient    Dispo: The patient is from: Home              Anticipated d/c is to: SNF              Anticipated d/c date  is: > 3 days              Patient currently is not medically stable to d/c.     Data Reviewed: Care during the described time interval was provided by me .  I have reviewed this patient's available data, including medical history, events of note, physical examination, and  all test results as part of my evaluation.   The patient is critically ill with multiple organ systems failure and requires high complexity decision making for assessment and support, frequent evaluation and titration of therapies, application of advanced monitoring technologies and extensive interpretation of multiple databases. Critical Care Time devoted to patient care services described in this note  Time spent: 70 minutes   Nashayla Telleria, [facility]

## 2022-07-27 NOTE — ED Notes (Signed)
Patient with shakiness and anxiety.  Ativan given per orders.

## 2022-07-27 NOTE — ED Provider Notes (Signed)
[place] EMERGENCY DEPARTMENT AT [facility] Provider Note   CSN: [id number] Arrival date & time: 07/27/22  1149     History  Chief Complaint  Patient presents with   Nausea   Emesis   Alcohol Problem    Brett Molina is a 32 y.o. male with medical history of EtOH abuse, hypertension.  Patient presents to ED for evaluation of intoxication.  The patient reports that ever since his birthday, [birth year], he is been drinking 12 to 18 12 oz beers per day.  The patient states that he is going through a divorce which is causing him significant mental distress.  The patient reports that he does have a history of alcohol abuse, he has been admitted in the past for withdrawal.  Patient states that he has not had substantial food ever since 1/16 and has been subsisting on beer.  Patient denies liquor.  Patient denies any other drugs.  Patient states that ever since he started drinking on 1/16 he has had persistent nausea and vomiting every day.  Patient denies any fevers, abdominal pain, diarrhea. Patient denies any shortness of breath or chest pain. Patient is stating he has a sore throat however he attributes this to repetitive vomiting.  Patient also has secondary complaint of bilateral foot wounds.  Patient reports he recently was placed on antibiotics for his foot wounds however did not complete his antibiotics secondary to nausea and vomiting.  Patient reports that these foot wounds been present since his last admission.   Emesis Associated symptoms: sore throat   Associated symptoms: no abdominal pain, no diarrhea and no fever   Alcohol Problem Pertinent negatives include no chest pain, no abdominal pain and no shortness of breath.       Home Medications Prior to Admission medications   Medication Sig Start Date End Date Taking? Authorizing Provider  apixaban (ELIQUIS) 5 MG TABS tablet Take 1 tablet (5 mg total) by mouth 2 (two) times daily. 07/17/22  Yes Elgergawy,  Leana Roe, MD  levETIRAcetam (KEPPRA) 500 MG tablet Take 1 tablet (500 mg total) by mouth 2 (two) times daily. 07/17/22  Yes Elgergawy, Leana Roe, MD  lisinopril (ZESTRIL) 20 MG tablet Take 20 mg by mouth daily. LF 06-24-22 # 30   Yes [provider]  metoprolol tartrate (LOPRESSOR) 25 MG tablet Take 1 tablet (25 mg total) by mouth 2 (two) times daily. 07/17/22  Yes Elgergawy, Leana Roe, MD  pantoprazole (PROTONIX) 40 MG tablet Take 1 tablet (40 mg total) by mouth daily. 07/17/22 08/16/22 Yes Elgergawy, Leana Roe, MD  amoxicillin-clavulanate (AUGMENTIN) 875-125 MG tablet Take 1 tablet by mouth 2 (two) times daily. Patient not taking: Reported on 07/27/2022 07/17/22   Elgergawy, Leana Roe, MD      Allergies    Patient has no known allergies.    Review of Systems   Review of Systems  Constitutional:  Negative for fever.  HENT:  Positive for sore throat.   Respiratory:  Negative for shortness of breath.   Cardiovascular:  Negative for chest pain.  Gastrointestinal:  Positive for nausea and vomiting. Negative for abdominal pain and diarrhea.  Skin:  Positive for wound.  All other systems reviewed and are negative.   Physical Exam Updated Vital Signs BP (!) 159/99   Pulse (!) 121   Temp 100.1 F (37.8 C) (Oral)   Resp 18   Ht 6\' 2"  (1.88 m)   Wt 99.8 kg   SpO2 99%   BMI  28.25 kg/m  Physical Exam Vitals and nursing note reviewed.  Constitutional:      General: He is not in acute distress.    Appearance: Normal appearance. He is not ill-appearing, toxic-appearing or diaphoretic.  HENT:     Head: Normocephalic and atraumatic.     Nose: Nose normal. No congestion.     Mouth/Throat:     Mouth: Mucous membranes are moist.     Pharynx: Oropharynx is clear.  Eyes:     Extraocular Movements: Extraocular movements intact.     Conjunctiva/sclera: Conjunctivae normal.     Pupils: Pupils are equal, round, and reactive to light.  Cardiovascular:     Rate and Rhythm: Regular rhythm.  Tachycardia present.  Pulmonary:     Effort: Pulmonary effort is normal.     Breath sounds: Normal breath sounds. No wheezing.  Abdominal:     General: Abdomen is flat.     Palpations: Abdomen is soft.     Tenderness: There is no abdominal tenderness.  Musculoskeletal:     Cervical back: Normal range of motion and neck supple. No tenderness.  Skin:    General: Skin is warm and dry.     Comments: Bilateral foot wounds, foul smelling purulent drainage. Please see pictures.  Neurological:     Mental Status: He is alert and oriented to person, place, and time.        ED Results / Procedures / Treatments   Labs (all labs ordered are listed, but only abnormal results are displayed) Labs Reviewed  COMPREHENSIVE METABOLIC PANEL - Abnormal; Notable for the following components:      Result Value   Potassium 3.4 (*)    Chloride 97 (*)    Glucose, Bld 127 (*)    BUN <5 (*)    Anion gap 17 (*)    All other components within normal limits  ETHANOL - Abnormal; Notable for the following components:   Alcohol, Ethyl (B) 164 (*)    All other components within normal limits  CBC - Abnormal; Notable for the following components:   RBC 3.59 (*)    Hemoglobin 11.7 (*)    HCT 34.4 (*)    Platelets 483 (*)    All other components within normal limits  RESP PANEL BY RT-PCR (RSV, FLU A&B, COVID)  RVPGX2  MAGNESIUM  LIPASE, BLOOD  RAPID URINE DRUG SCREEN, HOSP PERFORMED    EKG EKG Interpretation  Date/Time:  Monday July 27 2022 13:27:34 EST Ventricular Rate:  125 PR Interval:  121 QRS Duration: 86 QT Interval:  329 QTC Calculation: 475 R Axis:   46 Text Interpretation: Sinus tachycardia Anteroseptal infarct, age indeterminate Confirmed by Kristine Royal ([id number]) on 07/27/2022 2:36:19 PM  Radiology DG Foot Complete Left  Result Date: 07/27/2022 CLINICAL DATA:  Wounds EXAM: LEFT FOOT - COMPLETE 3 VIEW COMPARISON:  None Available. FINDINGS: There is no evidence of fracture or  dislocation. There is no evidence of arthropathy or other focal bone abnormality. Soft tissue swelling no especially of the great toe. IMPRESSION: Soft tissue swelling.  No osseous abnormalities identified. Electronically Signed   By: Layla Maw M.D.   On: 07/27/2022 15:28   DG Foot Complete Right  Result Date: 07/27/2022 CLINICAL DATA:  Extensive pole wounds EXAM: RIGHT FOOT COMPLETE - 3+ VIEW COMPARISON:  None Available. FINDINGS: No fracture or malalignment. No periostitis or bone destruction. Negative for soft tissue emphysema. Soft tissue ulcers or wounds at the tips of the first and second digits. IMPRESSION:  No acute osseous abnormality. Electronically Signed   By: Jasmine Pang M.D.   On: 07/27/2022 15:26   DG Chest 2 View  Result Date: 07/27/2022 CLINICAL DATA:  Concern for aspiration EXAM: CHEST - 2 VIEW COMPARISON:  Chest x-ray dated July 12, 2022 FINDINGS: The heart size and mediastinal contours are within normal limits. Both lungs are clear. The visualized skeletal structures are unremarkable. IMPRESSION: No active cardiopulmonary disease. Electronically Signed   By: Allegra Lai M.D.   On: 07/27/2022 15:26    Procedures Procedures    Medications Ordered in ED Medications  thiamine (VITAMIN B1) tablet 100 mg (100 mg Oral Given 07/27/22 1337)  folic acid (FOLVITE) tablet 1 mg (1 mg Oral Given 07/27/22 1337)  LORazepam (ATIVAN) injection 0-4 mg (1 mg Intravenous Given 07/27/22 1410)    Or  LORazepam (ATIVAN) tablet 0-4 mg ( Oral See Alternative 07/27/22 1410)  LORazepam (ATIVAN) injection 0-4 mg (has no administration in time range)    Or  LORazepam (ATIVAN) tablet 0-4 mg (has no administration in time range)  vancomycin (VANCOREADY) IVPB 1500 mg/300 mL (has no administration in time range)  piperacillin-tazobactam (ZOSYN) IVPB 3.375 g (has no administration in time range)  lactated ringers bolus 1,000 mL (has no administration in time range)  lactated ringers bolus  1,000 mL (0 mLs Intravenous Stopped 07/27/22 1515)  ondansetron (ZOFRAN) injection 4 mg (4 mg Intravenous Given 07/27/22 1250)  multivitamin with minerals tablet 1 tablet (1 tablet Oral Given 07/27/22 1337)    ED Course/ Medical Decision Making/ A&P :1}                          Medical Decision Making Amount and/or Complexity of Data Reviewed Labs: ordered. Radiology: ordered.  Risk OTC drugs. Prescription drug management.   32 year old male presents to the ED for evaluation.  Please see HPI for further details.  On examination the patient has a temperature of 99.75F.  Patient is tachycardic to 115.  Patient lung sounds are clear bilaterally, he is not hypoxic on room air.  Patient abdomen is soft and compressible throughout without tenderness.  Patient has bilateral toe wounds that appear to be infected.  Patient reports that this is a known problem for him.  Patient workup initiated by me includes CBC, CMP, ethanol, lipase, magnesium, rapid urine drug screen, viral panel.  I will add on chest x-ray to assess for aspiration.  I will add on bilateral foot x-rays to assess for osteomyelitis.  CIWA protocol initiated at this time.  Patient will be given multivitamins, folic acid, thiamine, lactated Ringer.  Patient most likely in alcoholic withdrawal which will require admission.  Patient tachycardic, hypertensive.   We will place the patient on broad-spectrum antibiotics to include Zosyn and vancomycin for his foot wounds.  Patient provided fluid.  At the end my shift, the patient foot x-rays had not yet resulted.  Patient also has not had chest x-ray completed.  Patient will be signed out to oncoming provider pending these studies.  Patient will need admission.  Final Clinical Impression(s) / ED Diagnoses Final diagnoses:  Alcohol abuse  Alcohol withdrawal syndrome without complication (HCC)  Alcoholic intoxication without complication [facility])    Rx / DC Orders ED Discharge Orders      None         Clent Ridges 07/27/22 1538    Wynetta Fines, MD 07/29/22 1506

## 2022-07-27 NOTE — ED Provider Notes (Signed)
  Physical Exam  BP (!) 157/98   Pulse (!) 122   Temp 100.1 F (37.8 C) (Oral)   Resp (!) 31   Ht 6\' 2"  (1.88 m)   Wt 99.8 kg   SpO2 96%   BMI 28.25 kg/m   Physical Exam Vitals and nursing note reviewed.  Constitutional:      Appearance: He is not toxic-appearing.  HENT:     Mouth/Throat:     Comments: No pharyngeal erythema, edema, or exudate noted.  Uvula midline.  Airway patent.  He does have some dry lips. Cardiovascular:     Rate and Rhythm: Tachycardia present.  Pulmonary:     Effort: Pulmonary effort is normal. No respiratory distress.  Abdominal:     Palpations: Abdomen is soft.     Tenderness: There is no abdominal tenderness. There is no guarding or rebound.  Musculoskeletal:     Comments: Palpable bilateral DP and PT pulses.  Compartments are soft.  Skin:    General: Skin is warm and dry.  Neurological:     Mental Status: He is alert.     Motor: No tremor.     Procedures  Procedures  ED Course / MDM    Medical Decision Making Amount and/or Complexity of Data Reviewed Labs: ordered. Radiology: ordered.  Risk OTC drugs. Prescription drug management.   Accepted handoff at shift change from Astrid Drafts, PA-C. Please see prior provider note for more detail.   Briefly: Patient is 32 y.o. M present for EtOH withdrawal.   DDX: concern for EtOH withdrawal, aspiration PNA, osteomyelitis.   Plan: Follow up on imaging and admit to medicine.   CXR unremarkable. XR of the bilateral feet do not show any osseous abnormality.   On my evaluation of the patient, he still remains tachycardic.  He does feel warm although is under multiple blankets.  I did order a rectal temp for him.  Given his sore throat, his exam is unremarkable but given the slight increase in temperature I did order a strep test for him.  He currently does not have any pain other than a sore throat.  His abdomen is nontender to palpation.  He has palpable DP and PT pulses to bilateral lower  feet with compartments being soft.  He is alert and nontremulous.  I ordered him some Tylenol for his increase in temperature as well as a strep swab.  The patient was already ordered fluids and Zosyn and vancomycin for presumed wound infections.  Admit to Dr. Dia Crawford.     Sherrell Puller, PA-C 07/27/22 1707    Elgie Congo, MD 07/27/22 [phone]

## 2022-07-27 NOTE — ED Triage Notes (Signed)
Patient has been drinking heavily for the past 6 days.  He has had 18 beers in the past 24 hours, last at 0500.  He is experiencing n/v and stomach pain.  Patient has hx of same.  160/108 bp, HR 124, RR 20, 98%, CBG 155.  He is alert and oriented and cooperative.

## 2022-07-28 ENCOUNTER — Inpatient Hospital Stay (HOSPITAL_COMMUNITY): Payer: Medicaid Other

## 2022-07-28 DIAGNOSIS — F10939 Alcohol use, unspecified with withdrawal, unspecified: Secondary | ICD-10-CM

## 2022-07-28 DIAGNOSIS — I96 Gangrene, not elsewhere classified: Secondary | ICD-10-CM

## 2022-07-28 DIAGNOSIS — I739 Peripheral vascular disease, unspecified: Secondary | ICD-10-CM

## 2022-07-28 DIAGNOSIS — F10930 Alcohol use, unspecified with withdrawal, uncomplicated: Secondary | ICD-10-CM

## 2022-07-28 LAB — CBC WITH DIFFERENTIAL/PLATELET
Abs Immature Granulocytes: 0.04 10*3/uL (ref 0.00–0.07)
Basophils Absolute: 0.1 10*3/uL (ref 0.0–0.1)
Basophils Relative: 1 %
Eosinophils Absolute: 0 10*3/uL (ref 0.0–0.5)
Eosinophils Relative: 0 %
HCT: 29.9 % — ABNORMAL LOW (ref 39.0–52.0)
Hemoglobin: 9.9 g/dL — ABNORMAL LOW (ref 13.0–17.0)
Immature Granulocytes: 1 %
Lymphocytes Relative: 19 %
Lymphs Abs: 1.3 10*3/uL (ref 0.7–4.0)
MCH: 33.6 pg (ref 26.0–34.0)
MCHC: 33.1 g/dL (ref 30.0–36.0)
MCV: 101.4 fL — ABNORMAL HIGH (ref 80.0–100.0)
Monocytes Absolute: 0.6 10*3/uL (ref 0.1–1.0)
Monocytes Relative: 9 %
Neutro Abs: 4.8 10*3/uL (ref 1.7–7.7)
Neutrophils Relative %: 70 %
Platelets: 312 10*3/uL (ref 150–400)
RBC: 2.95 MIL/uL — ABNORMAL LOW (ref 4.22–5.81)
RDW: 13.2 % (ref 11.5–15.5)
WBC: 6.7 10*3/uL (ref 4.0–10.5)
nRBC: 0 % (ref 0.0–0.2)

## 2022-07-28 LAB — COMPREHENSIVE METABOLIC PANEL
ALT: 17 U/L (ref 0–44)
AST: 22 U/L (ref 15–41)
Albumin: 2.7 g/dL — ABNORMAL LOW (ref 3.5–5.0)
Alkaline Phosphatase: 56 U/L (ref 38–126)
Anion gap: 9 (ref 5–15)
BUN: <5 mg/dL — ABNORMAL LOW (ref 6–20)
CO2: 26 mmol/L (ref 22–32)
Calcium: 8.5 mg/dL — ABNORMAL LOW (ref 8.9–10.3)
Chloride: 101 mmol/L (ref 98–111)
Creatinine, Ser: 0.84 mg/dL (ref 0.61–1.24)
GFR, Estimated: >60 mL/min (ref >60)
Glucose, Bld: 86 mg/dL (ref 70–99)
Potassium: 3.8 mmol/L (ref 3.5–5.1)
Sodium: 136 mmol/L (ref 135–145)
Total Bilirubin: 1.1 mg/dL (ref 0.3–1.2)
Total Protein: 6.2 g/dL — ABNORMAL LOW (ref 6.5–8.1)

## 2022-07-28 LAB — MAGNESIUM: Magnesium: 1.7 mg/dL (ref 1.7–2.4)

## 2022-07-28 LAB — PHOSPHORUS: Phosphorus: 4.3 mg/dL (ref 2.5–4.6)

## 2022-07-28 LAB — VAS US ABI WITH/WO TBI
Left ABI: 1.19
Right ABI: 1.17

## 2022-07-28 MED ORDER — VANCOMYCIN HCL 1500 MG/300ML IV SOLN
1500.0000 mg | Freq: Three times a day (TID) | INTRAVENOUS | Status: DC
  Administered 2022-07-28 – 2022-07-29 (×4): 1500 mg via INTRAVENOUS
  Filled 2022-07-28 (×6): qty 300

## 2022-07-28 MED ORDER — HEPARIN SODIUM (PORCINE) 5000 UNIT/ML IJ SOLN
5000.0000 [IU] | Freq: Three times a day (TID) | INTRAMUSCULAR | Status: DC
  Administered 2022-07-28 – 2022-07-29 (×4): 5000 [IU] via SUBCUTANEOUS
  Filled 2022-07-28 (×4): qty 1

## 2022-07-28 MED ORDER — SODIUM CHLORIDE 0.9 % IV SOLN: INTRAVENOUS | Status: DC

## 2022-07-28 MED ORDER — SILVER SULFADIAZINE 1 % EX CREA
TOPICAL_CREAM | Freq: Every day | CUTANEOUS | Status: DC
  Filled 2022-07-28: qty 50

## 2022-07-28 MED ORDER — PIPERACILLIN-TAZOBACTAM 3.375 G IVPB
3.3750 g | Freq: Three times a day (TID) | INTRAVENOUS | Status: DC
  Administered 2022-07-28 – 2022-07-29 (×4): 3.375 g via INTRAVENOUS
  Filled 2022-07-28 (×4): qty 50

## 2022-07-28 NOTE — Progress Notes (Signed)
ABI has been completed.  Preliminary results in CV Proc.   Brett Molina 07/28/2022 11:07 AM

## 2022-07-28 NOTE — ED Notes (Signed)
ED TO INPATIENT HANDOFF REPORT  ED Nurse Name and Phone #: Suzanna ObeyDara Nakeshia Waldeck [phone]  S Name/Age/Gender Brett Molina 32 y.o. male Room/Bed: WA23/WA23  Code Status   Code Status: Prior  Home/SNF/Other admit Patient oriented to: self, place, time, and situation Is this baseline? Yes   Triage Complete: Triage complete  Chief Complaint Alcohol withdrawal delirium [facility](HCC) [F10.931]  Triage Note Patient has been drinking heavily for the past 6 days.  He has had 18 beers in the past 24 hours, last at 0500.  He is experiencing n/v and stomach pain.  Patient has hx of same.  160/108 bp, HR 124, RR 20, 98%, CBG 155.  He is alert and oriented and cooperative.     Allergies No Known Allergies  Level of Care/Admitting Diagnosis ED Disposition     ED Disposition  Admit   Condition  --   Comment  [facility] [[id number]]  Level of Care: Telemetry [5]  Admit to tele based on following criteria: Complex arrhythmia (Bradycardia/Tachycardia)  May [facility] to [facility] or [facility] if equivalent level of care is available:: No  Covid Evaluation: Recent COVID positive no isolation required infection day 21-90  Diagnosis: Alcohol withdrawal delirium (HCC) [291.0.ICD-9-CM]  Admitting Physician: Drema DallasWOODS, CURTIS J [[id number]]  Attending Physician: Drema DallasWOODS, CURTIS J [[id number]]  Certification:: I certify this patient will need inpatient services for at least 2 midnights  Estimated Length of Stay: 5          B Medical/Surgery History Past Medical History:  Diagnosis Date   ETOH abuse    Hypertension    Past Surgical History:  Procedure Laterality Date   ESOPHAGOGASTRODUODENOSCOPY N/A 07/24/2018   Procedure: ESOPHAGOGASTRODUODENOSCOPY (EGD);  Surgeon: Rachael FeeJacobs, Daniel P, MD;  [facility];  Service: Endoscopy;  Laterality: N/A;     A IV Location/Drains/Wounds Patient Lines/Drains/Airways Status     Active Line/Drains/Airways     Name  Placement date Placement time Site Days   Peripheral IV 07/27/22 20 G Right Antecubital 07/27/22  1221  Antecubital  1   Peripheral IV 07/27/22 18 G 1" Left;Posterior Hand 07/27/22  1640  Hand  1   Wound / Incision (Open or Dehisced) 07/12/22 Degloving Toe (Comment  which one) Anterior;Left All toes 07/12/22  1740  Toe (Comment  which one)  16   Wound / Incision (Open or Dehisced) 07/12/22 Degloving Toe (Comment  which one) Anterior;Right all toes 07/12/22  1740  Toe (Comment  which one)  16   Wound / Incision (Open or Dehisced) 07/12/22 Irritant Dermatitis (Moisture Associated Skin Damage) Coccyx Medial bleeding, scattered, MASD 07/12/22  2000  Coccyx  16            Intake/Output Last 24 hours  Intake/Output Summary (Last 24 hours) at 07/28/2022 1248 Last data filed at 07/28/2022 1231 Gross per 24 hour  Intake 2560.53 ml  Output --  Net 2560.53 ml    Labs/Imaging Results for orders placed or performed during the hospital encounter of 07/27/22 (from the past 48 hour(s))  Ethanol     Status: Abnormal   Collection Time: 07/27/22 12:15 PM  Result Value Ref Range   Alcohol, Ethyl (B) 164 (H) <10 mg/dL    Comment: (NOTE) Lowest detectable limit for serum alcohol is 10 mg/dL.  For medical purposes only. Performed at [facility], [address] [place], [place] [id number]   Comprehensive metabolic panel     Status: Abnormal   Collection Time:  07/27/22 12:22 PM  Result Value Ref Range   Sodium 139 135 - 145 mmol/L   Potassium 3.4 (L) 3.5 - 5.1 mmol/L   Chloride 97 (L) 98 - 111 mmol/L   CO2 25 22 - 32 mmol/L   Glucose, Bld 127 (H) 70 - 99 mg/dL    Comment: Glucose reference range applies only to samples taken after fasting for at least 8 hours.   BUN <5 (L) 6 - 20 mg/dL   Creatinine, Ser 6.71 0.61 - 1.24 mg/dL   Calcium 9.0 8.9 - 24.5 mg/dL   Total Protein 7.8 6.5 - 8.1 g/dL   Albumin 3.5 3.5 - 5.0 g/dL   AST 31 15 - 41 U/L   ALT 24 0 - 44 U/L   Alkaline  Phosphatase 67 38 - 126 U/L   Total Bilirubin 0.5 0.3 - 1.2 mg/dL   GFR, Estimated >80 >99 mL/min    Comment: (NOTE) Calculated using the CKD-EPI Creatinine Equation (2021)    Anion gap 17 (H) 5 - 15    Comment: Performed at [facility], [address], [place], [place] [address]  cbc     Status: Abnormal   Collection Time: 07/27/22 12:22 PM  Result Value Ref Range   WBC 9.5 4.0 - 10.5 K/uL   RBC 3.59 (L) 4.22 - 5.81 MIL/uL   Hemoglobin 11.7 (L) 13.0 - 17.0 g/dL   HCT 50.5 (L) 39.7 - 67.3 %   MCV 95.8 80.0 - 100.0 fL   MCH 32.6 26.0 - 34.0 pg   MCHC 34.0 30.0 - 36.0 g/dL   RDW 41.9 37.9 - 02.4 %   Platelets 483 (H) 150 - 400 K/uL   nRBC 0.0 0.0 - 0.2 %    Comment: Performed at [facility], [address], [place], [place] [address]  Magnesium     Status: None   Collection Time: 07/27/22 12:22 PM  Result Value Ref Range   Magnesium 1.8 1.7 - 2.4 mg/dL    Comment: Performed at [facility], [address], [place], [place] [address]  Lipase, blood     Status: None   Collection Time: 07/27/22 12:22 PM  Result Value Ref Range   Lipase 34 11 - 51 U/L    Comment: Performed at [facility], [address] [place], [place] [address]  Resp panel by RT-PCR (RSV, Flu A&B, Covid) Anterior Nasal Swab     Status: None   Collection Time: 07/27/22  2:58 PM   Specimen: Anterior Nasal Swab  Result Value Ref Range   SARS Coronavirus 2 by RT PCR NEGATIVE NEGATIVE    Comment: (NOTE) SARS-CoV-2 target nucleic acids are NOT DETECTED.  The SARS-CoV-2 RNA is generally detectable in upper respiratory specimens during the acute phase of infection. The lowest concentration of SARS-CoV-2 viral copies this assay can detect is 138 copies/mL. A negative result does not preclude SARS-Cov-2 infection and should not be used as the sole basis for treatment or other patient management decisions. A negative result may occur with   improper specimen collection/handling, submission of specimen other than nasopharyngeal swab, presence of viral mutation(s) within the areas targeted by this assay, and inadequate number of viral copies(<138 copies/mL). A negative result must be combined with clinical observations, patient history, and epidemiological information. The expected result is Negative.  Fact Sheet for Patients:  BloggerCourse.com  Fact Sheet for Healthcare Providers:  SeriousBroker.it  This test is no t yet approved or  cleared by the [place] and  has been authorized for detection and/or diagnosis of SARS-CoV-2 by FDA under an Emergency Use Authorization (EUA). This EUA will remain  in effect (meaning this test can be used) for the duration of the COVID-19 declaration under Section 564(b)(1) of the Act, 21 U.S.C.section 360bbb-3(b)(1), unless the authorization is terminated  or revoked sooner.       Influenza A by PCR NEGATIVE NEGATIVE   Influenza B by PCR NEGATIVE NEGATIVE    Comment: (NOTE) The Xpert Xpress SARS-CoV-2/FLU/RSV plus assay is intended as an aid in the diagnosis of influenza from Nasopharyngeal swab specimens and should not be used as a sole basis for treatment. Nasal washings and aspirates are unacceptable for Xpert Xpress SARS-CoV-2/FLU/RSV testing.  Fact Sheet for Patients: BloggerCourse.com  Fact Sheet for Healthcare Providers: SeriousBroker.it  This test is not yet approved or cleared by the [place] FDA and has been authorized for detection and/or diagnosis of SARS-CoV-2 by FDA under an Emergency Use Authorization (EUA). This EUA will remain in effect (meaning this test can be used) for the duration of the COVID-19 declaration under Section 564(b)(1) of the Act, 21 U.S.C. section 360bbb-3(b)(1), unless the authorization is terminated or revoked.     Resp  Syncytial Virus by PCR NEGATIVE NEGATIVE    Comment: (NOTE) Fact Sheet for Patients: BloggerCourse.com  Fact Sheet for Healthcare Providers: SeriousBroker.it  This test is not yet approved or cleared by the [place] FDA and has been authorized for detection and/or diagnosis of SARS-CoV-2 by FDA under an Emergency Use Authorization (EUA). This EUA will remain in effect (meaning this test can be used) for the duration of the COVID-19 declaration under Section 564(b)(1) of the Act, 21 U.S.C. section 360bbb-3(b)(1), unless the authorization is terminated or revoked.  Performed at [facility], [address] [place], [place] [address]   Culture, blood (Routine X 2) w Reflex to ID Panel     Status: None (Preliminary result)   Collection Time: 07/27/22  4:16 PM   Specimen: BLOOD  Result Value Ref Range   Specimen Description      BLOOD LEFT ANTECUBITAL Performed at [facility], [address], [place], [place] [address]    Special Requests      BOTTLES DRAWN AEROBIC AND ANAEROBIC Blood Culture results may not be optimal due to an excessive volume of blood received in culture bottles Performed at [facility], [address] [place], [place] [address]    Culture      NO GROWTH < 12 HOURS Performed at [facility], [address], [place], [place] [address]    Report Status PENDING   Group A Strep by PCR     Status: None   Collection Time: 07/27/22  4:56 PM   Specimen: Throat; Sterile Swab  Result Value Ref Range   Group A Strep by PCR NOT DETECTED NOT DETECTED    Comment: Performed at [facility], [address] [place], [place] [address]  Culture, blood (Routine X 2) w Reflex to ID Panel     Status: None (Preliminary result)   Collection Time: 07/27/22  5:01 PM   Specimen: BLOOD LEFT HAND  Result Value Ref Range   Specimen Description       BLOOD LEFT HAND Performed at [facility], [address], [place], [place] [address]    Special Requests      BOTTLES DRAWN AEROBIC  AND ANAEROBIC Blood Culture results may not be optimal due to an excessive volume of blood received in culture bottles Performed at [facility], [address], [place], [place] [address]    Culture      NO GROWTH < 12 HOURS Performed at [facility], [address] [place], [place] [address]    Report Status PENDING   Lactic acid, plasma     Status: None   Collection Time: 07/27/22  7:13 PM  Result Value Ref Range   Lactic Acid, Venous 1.1 0.5 - 1.9 mmol/L    Comment: Performed at [facility], [address], [place], [place] [address]  Comprehensive metabolic panel     Status: Abnormal   Collection Time: 07/28/22  6:24 AM  Result Value Ref Range   Sodium 136 135 - 145 mmol/L   Potassium 3.8 3.5 - 5.1 mmol/L   Chloride 101 98 - 111 mmol/L   CO2 26 22 - 32 mmol/L   Glucose, Bld 86 70 - 99 mg/dL    Comment: Glucose reference range applies only to samples taken after fasting for at least 8 hours.   BUN <5 (L) 6 - 20 mg/dL   Creatinine, Ser 7.42 0.61 - 1.24 mg/dL   Calcium 8.5 (L) 8.9 - 10.3 mg/dL   Total Protein 6.2 (L) 6.5 - 8.1 g/dL   Albumin 2.7 (L) 3.5 - 5.0 g/dL   AST 22 15 - 41 U/L   ALT 17 0 - 44 U/L   Alkaline Phosphatase 56 38 - 126 U/L   Total Bilirubin 1.1 0.3 - 1.2 mg/dL   GFR, Estimated >59 >56 mL/min    Comment: (NOTE) Calculated using the CKD-EPI Creatinine Equation (2021)    Anion gap 9 5 - 15    Comment: Performed at [facility], [address], [place], [place] [address]  Magnesium     Status: None   Collection Time: 07/28/22  6:24 AM  Result Value Ref Range   Magnesium 1.7 1.7 - 2.4 mg/dL    Comment: Performed at [facility], [address], [place], [place] [address]  Phosphorus     Status: None   Collection Time: 07/28/22   6:24 AM  Result Value Ref Range   Phosphorus 4.3 2.5 - 4.6 mg/dL    Comment: Performed at [facility], [address] [place], [place] [address]  CBC with Differential/Platelet     Status: Abnormal   Collection Time: 07/28/22  6:24 AM  Result Value Ref Range   WBC 6.7 4.0 - 10.5 K/uL   RBC 2.95 (L) 4.22 - 5.81 MIL/uL   Hemoglobin 9.9 (L) 13.0 - 17.0 g/dL   HCT 16.6 (L) 06.3 - 01.6 %   MCV 101.4 (H) 80.0 - 100.0 fL   MCH 33.6 26.0 - 34.0 pg   MCHC 33.1 30.0 - 36.0 g/dL   RDW 01.0 93.2 - 35.5 %   Platelets 312 150 - 400 K/uL   nRBC 0.0 0.0 - 0.2 %   Neutrophils Relative % 70 %   Neutro Abs 4.8 1.7 - 7.7 K/uL   Lymphocytes Relative 19 %   Lymphs Abs 1.3 0.7 - 4.0 K/uL   Monocytes Relative 9 %   Monocytes Absolute 0.6 0.1 - 1.0 K/uL   Eosinophils Relative 0 %   Eosinophils Absolute 0.0 0.0 - 0.5 K/uL   Basophils Relative 1 %   Basophils Absolute 0.1 0.0 - 0.1  K/uL   Immature Granulocytes 1 %   Abs Immature Granulocytes 0.04 0.00 - 0.07 K/uL    Comment: Performed at [facility], [place] [address], [place], [place] [address]   VAS [place] ABI WITH/WO TBI  Result Date: 07/28/2022  LOWER EXTREMITY DOPPLER STUDY Patient Name:  Brett Molina  Date of Exam:   07/28/2022 Medical Rec #: [id number]            Accession #:    [id number] Date of Birth: Jun 27, 1991            Patient Gender: M Patient Age:   71 years Exam Location:  [facility] Procedure:      VAS [place] ABI WITH/WO TBI Referring Phys: MARCUS DUDA --------------------------------------------------------------------------------  Indications: PVD with gangrene High Risk Factors: Hypertension, Diabetes.  Limitations: Today's exam was limited due to wound. Comparison Study: no prior Performing Technologist: Archie Patten RVS  Examination Guidelines: A complete evaluation includes at minimum, Doppler waveform signals and systolic blood pressure reading at the level of bilateral brachial, anterior  tibial, and posterior tibial arteries, when vessel segments are accessible. Bilateral testing is considered an integral part of a complete examination. Photoelectric Plethysmograph (PPG) waveforms and toe systolic pressure readings are included as required and additional duplex testing as needed. Limited examinations for reoccurring indications may be performed as noted.  ABI Findings: +---------+------------------+-----+---------+---------------------------------+ Right    Rt Pressure (mmHg)IndexWaveform Comment                           +---------+------------------+-----+---------+---------------------------------+ Brachial 150                    triphasic                                  +---------+------------------+-----+---------+---------------------------------+ PTA      147               0.98 triphasic                                  +---------+------------------+-----+---------+---------------------------------+ DP       176               1.17 triphasic                                  +---------+------------------+-----+---------+---------------------------------+ Great Toe                                unable to obtain great toe                                                 pressure due to wound             +---------+------------------+-----+---------+---------------------------------+ +---------+------------------+-----+---------+---------------------------------+ Left     Lt Pressure (mmHg)IndexWaveform Comment                           +---------+------------------+-----+---------+---------------------------------+ Brachial 141  triphasic                                  +---------+------------------+-----+---------+---------------------------------+ PTA      146               0.97 triphasic                                  +---------+------------------+-----+---------+---------------------------------+ DP       178                1.19 triphasic                                  +---------+------------------+-----+---------+---------------------------------+ Great Toe                                unable to obtain great toe                                                 pressure due to wound             +---------+------------------+-----+---------+---------------------------------+ +-------+-----------+-----------+------------+------------+ ABI/TBIToday's ABIToday's TBIPrevious ABIPrevious TBI +-------+-----------+-----------+------------+------------+ Right  1.17                                           +-------+-----------+-----------+------------+------------+ Left   1.19                                           +-------+-----------+-----------+------------+------------+   Summary: Right: Resting right ankle-brachial index is within normal range. Left: Resting left ankle-brachial index is within normal range. *See table(s) above for measurements and observations.     Preliminary    DG Foot Complete Left  Result Date: 07/27/2022 CLINICAL DATA:  Wounds EXAM: LEFT FOOT - COMPLETE 3 VIEW COMPARISON:  None Available. FINDINGS: There is no evidence of fracture or dislocation. There is no evidence of arthropathy or other focal bone abnormality. Soft tissue swelling no especially of the great toe. IMPRESSION: Soft tissue swelling.  No osseous abnormalities identified. Electronically Signed   By: Layla Maw M.D.   On: 07/27/2022 15:28   DG Foot Complete Right  Result Date: 07/27/2022 CLINICAL DATA:  Extensive pole wounds EXAM: RIGHT FOOT COMPLETE - 3+ VIEW COMPARISON:  None Available. FINDINGS: No fracture or malalignment. No periostitis or bone destruction. Negative for soft tissue emphysema. Soft tissue ulcers or wounds at the tips of the first and second digits. IMPRESSION: No acute osseous abnormality. Electronically Signed   By: Jasmine Pang M.D.   On: 07/27/2022 15:26   DG Chest 2  View  Result Date: 07/27/2022 CLINICAL DATA:  Concern for aspiration EXAM: CHEST - 2 VIEW COMPARISON:  Chest x-ray dated July 12, 2022 FINDINGS: The heart size and mediastinal contours are within normal limits. Both lungs are clear. The visualized skeletal structures are unremarkable. IMPRESSION: No active cardiopulmonary disease. Electronically Signed  By: Yetta Glassman M.D.   On: 07/27/2022 15:26    Pending Labs Unresulted Labs (From admission, onward)     Start     Ordered   07/28/22 0500  Comprehensive metabolic panel  Daily,   R      07/27/22 1615   07/28/22 0500  Magnesium  Daily,   R      07/27/22 1615   07/28/22 0500  Phosphorus  Daily,   R      07/27/22 1615   07/28/22 0500  CBC with Differential/Platelet  Daily,   R      07/27/22 1912   07/27/22 1915  Levetiracetam level  Once,   R        07/27/22 1921            Vitals/Pain Today's Vitals   07/28/22 0830 07/28/22 0955 07/28/22 1130 07/28/22 1233  BP: 139/88 (!) 134/96 129/82 125/85  Pulse: 86 92 100 91  Resp: 17  18 18   Temp: 98.1 F (36.7 C)   99.5 F (37.5 C)  TempSrc: Oral   Oral  SpO2: 100%  100% 100%  Weight:      Height:      PainSc:        Isolation Precautions No active isolations  Medications Medications  folic acid (FOLVITE) tablet 1 mg (1 mg Oral Given by Other 07/28/22 0956)  LORazepam (ATIVAN) injection 0-4 mg (2 mg Intravenous Given 07/28/22 0809)    Or  LORazepam (ATIVAN) tablet 0-4 mg ( Oral See Alternative 07/28/22 0809)  LORazepam (ATIVAN) injection 0-4 mg (has no administration in time range)    Or  LORazepam (ATIVAN) tablet 0-4 mg (has no administration in time range)  LORazepam (ATIVAN) tablet 1-4 mg ( Oral See Alternative 07/27/22 1742)    Or  LORazepam (ATIVAN) injection 1-4 mg (2 mg Intravenous Given 07/27/22 1742)  thiamine (VITAMIN B1) tablet 100 mg ( Oral See Alternative 07/28/22 1008)    Or  thiamine (VITAMIN B1) injection 100 mg (100 mg Intravenous Not Given 07/28/22  1008)  multivitamin with minerals tablet 1 tablet (1 tablet Oral Given 07/28/22 0955)  lisinopril (ZESTRIL) tablet 20 mg (20 mg Oral Given 07/28/22 0955)  levETIRAcetam (KEPPRA) IVPB 500 mg/100 mL premix (0 mg Intravenous Stopped 07/28/22 0837)  metoprolol tartrate (LOPRESSOR) tablet 12.5 mg (12.5 mg Oral Given 07/28/22 0955)  piperacillin-tazobactam (ZOSYN) IVPB 3.375 g (0 g Intravenous Stopped 07/28/22 1231)  vancomycin (VANCOREADY) IVPB 1500 mg/300 mL (1,500 mg Intravenous New Bag/Given 07/28/22 1223)  heparin injection 5,000 Units (5,000 Units Subcutaneous Given 07/28/22 0808)  silver sulfADIAZINE (SILVADENE) 1 % cream ( Topical Not Given 07/28/22 1230)  lactated ringers bolus 1,000 mL (0 mLs Intravenous Stopped 07/27/22 1515)  ondansetron (ZOFRAN) injection 4 mg (4 mg Intravenous Given 07/27/22 1250)  multivitamin with minerals tablet 1 tablet (1 tablet Oral Given 07/27/22 1337)  vancomycin (VANCOREADY) IVPB 1500 mg/300 mL (0 mg Intravenous Stopped 07/27/22 1836)  piperacillin-tazobactam (ZOSYN) IVPB 3.375 g (0 g Intravenous Stopped 07/27/22 1616)  lactated ringers bolus 1,000 mL (0 mLs Intravenous Stopped 07/27/22 1731)  acetaminophen (TYLENOL) tablet 1,000 mg (1,000 mg Oral Given 07/27/22 1730)  potassium chloride 10 mEq in 100 mL IVPB (0 mEq Intravenous Stopped 07/28/22 0346)    Mobility walks     Focused Assessments N/a   R Recommendations: See Admitting Provider Note  Report given to:   Additional Notes: no further

## 2022-07-28 NOTE — Progress Notes (Signed)
Orthopedic Tech Progress Note Patient Details:  Brett Molina 04/05/1991 [id number]  Patient ID: Brett Molina, male   DOB: [birth year], 32 y.o.   MRN: [id number]  Brett Molina 07/28/2022, 12:04 PM Patient brought his own post op shoes from his last admission. They are correct size and serviceable.

## 2022-07-28 NOTE — Progress Notes (Signed)
Pharmacy Antibiotic Note  Brett Molina is a 32 y.o. male admitted on 07/27/2022 with Gangrene bilateral feet and toes.  Pharmacy has been consulted for Vanco dosing.  ID: Gangrene bilateral feet and toes  - Tmax 100.7. WBC 6.7. Scr <1  1/22 Vanco>> 1/22: Zosyn>>  Plan: Vanc 1500mg  IV q8h (AUC 481.9, Scr 0.8) Zosyn 3.375g IV q8hr    Height: 6\' 2"  (188 cm) Weight: 99.8 kg (220 lb) IBW/kg (Calculated) : 82.2  Temp (24hrs), Avg:99.5 F (37.5 C), Min:98.8 F (37.1 C), Max:100.7 F (38.2 C)  Recent Labs  Lab 07/27/22 1222 07/27/22 1913 07/28/22 0624  WBC 9.5  --  6.7  CREATININE 0.72  --  0.84  LATICACIDVEN  --  1.1  --     Estimated Creatinine Clearance: 156.3 mL/min (by C-G formula based on SCr of 0.84 mg/dL).    No Known Allergies  Lucyann Romano S. Alford Highland, PharmD, BCPS Clinical Staff Pharmacist [email or web address]  Wayland Salinas 07/28/2022 7:37 AM

## 2022-07-28 NOTE — Plan of Care (Signed)

## 2022-07-28 NOTE — Progress Notes (Signed)
Brett Molina EUM:[id number] DOB: Dec 02, 1990 DOA: 07/27/2022 PCP: Pcp, No   Subj: 32 y.o.BM PMHx EtOH abuse, hypertension.     Presents to ED for evaluation of intoxication.  The patient reports that ever since his birthday, on [birth year], he is been drinking 12 to 18 12 oz beers per day.  The patient states that he is going through a divorce which is causing him significant mental distress.  The patient reports that he does have a history of alcohol abuse, he has been admitted in the past for withdrawal.  Patient states that he has not had substantial food ever since 1/16 and has been subsisting on beer.  Patient denies liquor.  Patient denies any other drugs.  Patient states that ever since he started drinking on 1/16 he has had persistent nausea and vomiting every day.  Patient denies any fevers, abdominal pain, diarrhea. Patient denies any shortness of breath or chest pain. Patient is stating he has a sore throat however he attributes this to repetitive vomiting.   Patient also has secondary complaint of bilateral foot wounds.  Patient reports he recently was placed on antibiotics for his foot wounds however did not complete his antibiotics secondary to nausea and vomiting.  Patient reports that these foot wounds been present since his last admission   Obj: 1/23 Tmax overnight 38.2 C, A/O x 4, states pain in feet tolerable    Objective: VITAL SIGNS: Temp: 98.8 F (37.1 C) (01/23 0415) Temp Source: Oral (01/23 0415) BP: 117/70 (01/23 0700) Pulse Rate: 93 (01/23 0700)   VENTILATOR SETTINGS: **   Intake/Output Summary (Last 24 hours) at 07/28/2022 5400 Last data filed at 07/28/2022 8676 Gross per 24 hour  Intake 2123.34 ml  Output --  Net 2123.34 ml     Exam: Physical Exam:  General: A/O x 4, No acute respiratory distress Eyes: negative scleral hemorrhage, negative anisocoria, negative icterus ENT: Negative Runny nose, negative gingival bleeding, Neck:  Negative scars,  masses, torticollis, lymphadenopathy, JVD Lungs: Clear to auscultation bilaterally without wheezes or crackles Cardiovascular: Regular rate and rhythm without murmur gallop or rub normal S1 and S2 Abdomen: negative abdominal pain, nondistended, positive soft, bowel sounds, no rebound, no ascites, no appreciable mass Extremities: No significant cyanosis, clubbing, or edema bilateral lower extremities Skin: See photos below Psychiatric:  Negative depression, negative anxiety, negative fatigue, negative mania  Central nervous system:  Cranial nerves II through XII intact, tongue/uvula midline, all extremities muscle strength 5/5, sensation intact throughout, negative dysarthria, negative expressive aphasia, negative receptive aphasia.          Mobility Assessment (last 72 hours)     Mobility Assessment   No documentation.             Code Status: Full (DVT Prophylaxis: Apixaban Family Communication:   Status is: Inpatient       Dispo: The patient is from: Home              Anticipated d/c is to: SNF              Anticipated d/c date is: > 3 days              Patient currently is not medically stable to d/c.    Procedures/Significant Events:    Consultants:  Phone consult Dr. Sharol Given vascular surgery   Cultures 1/22 influenza A/B negative 1/22 RSV negative 1/22 SARS coronavirus negative 1/22 blood LEFT AC NGTD 1/22 group A strep by PCR negative 1/22 blood LEFT hand  NGTD   Antimicrobials: Anti-infectives (From admission, onward)    Start     Ordered Stop   07/28/22 0400  vancomycin (VANCOREADY) IVPB 1500 mg/300 mL        07/28/22 0136     07/28/22 0200  piperacillin-tazobactam (ZOSYN) IVPB 3.375 g        07/28/22 0136     07/27/22 1500  vancomycin (VANCOREADY) IVPB 1500 mg/300 mL        07/27/22 1458 07/27/22 1836   07/27/22 1500  piperacillin-tazobactam (ZOSYN) IVPB 3.375 g        07/27/22 1458 07/27/22 1616         Assessment & Plan: Covid  vaccination;   Principal Problem:   Alcohol withdrawal delirium ([place]) Active Problems:   Acute alcohol intoxication ([place])   Alcohol withdrawal seizure with complication ([place])   Alcoholic hepatitis without ascites   HTN (hypertension)   Alcohol abuse   Obesity (BMI 30-39.9)   Seizure ([place])   Gangrene ([place])   EtOH intoxication -Normal saline 166ml/hr - CIWA protocol - If patient breakthrough CIWA protocol placed on Precedex goal RASS -2   EtOH withdrawal seizure with complication - See EtOH intoxication   EtOH abuse -Consult TOC for evaluation - See EtOH intoxication   Essential HTN - Lisinopril 20 mg daily - Metoprolol 12.5 mg BID (home dose 25, but given his current infection will start on lower dose)   Seizure -Keppra level pending - Keppra IV 500 mg BID   Gangrene bilateral feet and toes - Tmax 38.2 C - Continue current antibiotics - 1/23 lactic acid resolved - 1/23 discussed case with Dr. Sharol Given: Debridement vs amputation.  His recommendations are as follows  Podiatry exam states he has palpable pulses, photographs show ulcerative changes to the right heel and all toes.  Proceed with Silvadene dressing changes to the ulcers plus the postoperative shoes Dr. Sharol Given will follow-up in the office. Patient needs time to see what will heal without surgical intervention.    Hx PE - Restart Eliquis 5 mg BID   Hypokalemia - Potassium goal> 4 - Potassium IV 60 mEq     Mobility Assessment (last 72 hours)       Mobility Assessment   No documentation.            Mobility Assessment (last 72 hours)     Mobility Assessment   No documentation.                Time: 50 minutes         Care during the described time interval was provided by me .  I have reviewed this patient's available data, including medical history, events of note, physical examination, and all test results as part of my evaluation.

## 2022-07-28 NOTE — Progress Notes (Signed)
   07/28/22 1454  Assess: MEWS Score  Temp 98.7 F (37.1 C)  BP (!) 129/92  MAP (mmHg) 105  Pulse Rate (!) 119  Resp 16  SpO2 100 %  Assess: MEWS Score  MEWS Temp 0  MEWS Systolic 0  MEWS Pulse 2  MEWS RR 0  MEWS LOC 0  MEWS Score 2  MEWS Score Color Yellow  Assess: if the MEWS score is Yellow or Red  Were vital signs taken at a resting state? Yes  Focused Assessment Change from prior assessment (see assessment flowsheet)  Does the patient meet 2 or more of the SIRS criteria? Yes  Does the patient have a confirmed or suspected source of infection? No  MEWS guidelines implemented *See Row Information* Yes  Treat  MEWS Interventions Administered scheduled meds/treatments;Administered prn meds/treatments  Pain Scale 0-10  Pain Score 0  Take Vital Signs  Increase Vital Sign Frequency  Yellow: Q 2hr X 2 then Q 4hr X 2, if remains yellow, continue Q 4hrs  Escalate  MEWS: Escalate Yellow: discuss with charge nurse/RN and consider discussing with provider and RRT  Notify: Charge Nurse/RN  Name of Charge Nurse/RN Notified Javanna Patin RN  Date Charge Nurse/RN Notified 07/28/22  Time Charge Nurse/RN Notified 1454  Provider Notification  Provider Name/Title Sherral Hammers  Date Provider Notified 07/28/22  Time Provider Notified 1455  Method of Notification Face-to-face  Notification Reason Change in status  Provider response At bedside  Date of Provider Response 07/28/22  Time of Provider Response 1456  Document  Patient Outcome Stabilized after interventions  Progress note created (see row info) Yes  Assess: SIRS CRITERIA  SIRS Temperature  0  SIRS Pulse 1  SIRS Respirations  0  SIRS WBC 0  SIRS Score Sum  1

## 2022-07-29 LAB — COMPREHENSIVE METABOLIC PANEL
ALT: 16 U/L (ref 0–44)
AST: 23 U/L (ref 15–41)
Albumin: 2.8 g/dL — ABNORMAL LOW (ref 3.5–5.0)
Alkaline Phosphatase: 53 U/L (ref 38–126)
Anion gap: 9 (ref 5–15)
BUN: <5 mg/dL — ABNORMAL LOW (ref 6–20)
CO2: 23 mmol/L (ref 22–32)
Calcium: 8.5 mg/dL — ABNORMAL LOW (ref 8.9–10.3)
Chloride: 104 mmol/L (ref 98–111)
Creatinine, Ser: 0.83 mg/dL (ref 0.61–1.24)
GFR, Estimated: >60 mL/min (ref >60)
Glucose, Bld: 91 mg/dL (ref 70–99)
Potassium: 3.2 mmol/L — ABNORMAL LOW (ref 3.5–5.1)
Sodium: 136 mmol/L (ref 135–145)
Total Bilirubin: 0.8 mg/dL (ref 0.3–1.2)
Total Protein: 6.4 g/dL — ABNORMAL LOW (ref 6.5–8.1)

## 2022-07-29 LAB — LEVETIRACETAM LEVEL

## 2022-07-29 LAB — CBC WITH DIFFERENTIAL/PLATELET
Abs Immature Granulocytes: 0.06 10*3/uL (ref 0.00–0.07)
Basophils Absolute: 0 10*3/uL (ref 0.0–0.1)
Basophils Relative: 1 %
Eosinophils Absolute: 0.1 10*3/uL (ref 0.0–0.5)
Eosinophils Relative: 1 %
HCT: 31.1 % — ABNORMAL LOW (ref 39.0–52.0)
Hemoglobin: 9.9 g/dL — ABNORMAL LOW (ref 13.0–17.0)
Immature Granulocytes: 1 %
Lymphocytes Relative: 24 %
Lymphs Abs: 1.9 10*3/uL (ref 0.7–4.0)
MCH: 32.8 pg (ref 26.0–34.0)
MCHC: 31.8 g/dL (ref 30.0–36.0)
MCV: 103 fL — ABNORMAL HIGH (ref 80.0–100.0)
Monocytes Absolute: 0.5 10*3/uL (ref 0.1–1.0)
Monocytes Relative: 6 %
Neutro Abs: 5.2 10*3/uL (ref 1.7–7.7)
Neutrophils Relative %: 67 %
Platelets: 292 10*3/uL (ref 150–400)
RBC: 3.02 MIL/uL — ABNORMAL LOW (ref 4.22–5.81)
RDW: 13.2 % (ref 11.5–15.5)
WBC: 7.7 10*3/uL (ref 4.0–10.5)
nRBC: 0 % (ref 0.0–0.2)

## 2022-07-29 LAB — MAGNESIUM: Magnesium: 1.6 mg/dL — ABNORMAL LOW (ref 1.7–2.4)

## 2022-07-29 LAB — PHOSPHORUS: Phosphorus: 4 mg/dL (ref 2.5–4.6)

## 2022-07-29 MED ORDER — DOXYCYCLINE HYCLATE 100 MG PO TABS
100.0000 mg | ORAL_TABLET | Freq: Two times a day (BID) | ORAL | Status: DC
  Administered 2022-07-29 – 2022-07-30 (×3): 100 mg via ORAL
  Filled 2022-07-29 (×4): qty 1

## 2022-07-29 MED ORDER — LEVETIRACETAM 500 MG PO TABS
500.0000 mg | ORAL_TABLET | Freq: Two times a day (BID) | ORAL | Status: DC
  Administered 2022-07-29 – 2022-07-30 (×2): 500 mg via ORAL
  Filled 2022-07-29 (×2): qty 1

## 2022-07-29 MED ORDER — APIXABAN 5 MG PO TABS
5.0000 mg | ORAL_TABLET | Freq: Two times a day (BID) | ORAL | Status: DC
  Administered 2022-07-29 – 2022-07-30 (×3): 5 mg via ORAL
  Filled 2022-07-29 (×3): qty 1

## 2022-07-29 MED ORDER — ENSURE ENLIVE PO LIQD
237.0000 mL | Freq: Three times a day (TID) | ORAL | Status: DC
  Administered 2022-07-29 – 2022-07-30 (×2): 237 mL via ORAL

## 2022-07-29 NOTE — Progress Notes (Signed)
[facility]                                                                              Brett Molina, is a 32 y.o. male, DOB - 07/08/1990, ONG:[id number] Admit date - 07/27/2022    Outpatient Primary MD for the patient is Pcp, No  LOS - 2  days  Chief Complaint  Patient presents with   Nausea   Emesis   Alcohol Problem       Brief summary   Patient is a 32 year old male with history of alcohol abuse, HTN presented to ED with nausea vomiting every day, bilateral feet wounds. Patient reported that ever since his birthday on [birth year], he had been drinking 12 to 18, 12 ounce beers every day.  Stated that he is going through divorce which is causing him mental distress, has a history of alcohol abuse and has been admitted in the past for withdrawals.  Ordered since drinking he has been having persistent nausea and vomiting every day, no abdominal pain diarrhea, shortness of breath or chest pain. Also reported bilateral feet wounds, reported that he was recently placed on antibiotics for his wounds however did not complete his antibiotics due to nausea and vomiting.  Assessment & Plan    Principal Problem:   Alcohol withdrawal delirium (HCC), alcohol intoxication -Patient was placed on CIWA protocol, 5 this AM -Continue Ativan, thiamine, folate -Currently stable, not in acute withdrawals. -Counseled strongly on alcohol cessation  Active Problems:  Gangrene of bilateral feet and toes - Dr Joseph Art d/w Dr Lajoyce Corners who recommended continue dressing changes to the ulcers and postop shoes.  Follow-up in the office.  Patient needs time to see what will heal without surgical intervention. -Wound care consulted, appreciate recommendations -DC IV vancomycin, Zosyn, transition to oral doxycycline for 2 weeks -Patient will need wound care teaching, possible home health RN    Alcohol withdrawal seizure with complication (HCC) -Stable, no acute withdrawals or seizures     HTN  (hypertension) -BP currently stable, continue lisinopril, metoprolol    Alcohol abuse -Counseled strongly on alcohol cessation     Seizure (HCC) -Stable, continue Keppra  History of pulmonary embolism -Continue eliquis  Code Status: Full code DVT Prophylaxis:   apixaban (ELIQUIS) tablet 5 mg   Level of Care: Level of care: Telemetry Family Communication: Updated patient Disposition Plan:      Remains inpatient appropriate: Possible DC home in am  Procedures:  None  Consultants:   Dr. Lajoyce Corners  Antimicrobials:   Anti-infectives (From admission, onward)    Start     Dose/Rate Route Frequency Ordered Stop   07/29/22 1200  doxycycline (VIBRA-TABS) tablet 100 mg        100 mg Oral Every 12 hours 07/29/22 0945     07/28/22 0400  vancomycin (VANCOREADY) IVPB 1500 mg/300 mL  Status:  Discontinued        1,500 mg 150 mL/hr over 120 Minutes Intravenous Every 8 hours 07/28/22 0136 07/29/22 0945   07/28/22 0200  piperacillin-tazobactam (ZOSYN) IVPB 3.375 g  Status:  Discontinued        3.375 g 12.5 mL/hr over 240  Minutes Intravenous Every 8 hours 07/28/22 0136 07/29/22 0945   07/27/22 1500  vancomycin (VANCOREADY) IVPB 1500 mg/300 mL        1,500 mg 150 mL/hr over 120 Minutes Intravenous  Once 07/27/22 1458 07/27/22 1836   07/27/22 1500  piperacillin-tazobactam (ZOSYN) IVPB 3.375 g        3.375 g 100 mL/hr over 30 Minutes Intravenous  Once 07/27/22 1458 07/27/22 1616          Medications  apixaban  5 mg Oral BID   doxycycline  100 mg Oral Q12H   folic acid  1 mg Oral Daily   levETIRAcetam  500 mg Oral BID   lisinopril  20 mg Oral Daily   LORazepam  0-4 mg Intravenous Q12H   Or   LORazepam  0-4 mg Oral Q12H   metoprolol tartrate  12.5 mg Oral BID   multivitamin with minerals  1 tablet Oral Daily   silver sulfADIAZINE   Topical Daily   thiamine  100 mg Oral Daily   Or   thiamine  100 mg Intravenous Daily      Subjective:   Brett Molina was seen and examined  today.  Appears to be stable, not in any acute withdrawals during my encounter.  No seizures.  Tolerating p.o. diet.  No active nausea or vomiting.  CIWA 5 this a.m.  Foul-smelling gangrene bilateral toes   Objective:   Vitals:   07/28/22 2233 07/29/22 0259 07/29/22 0541 07/29/22 0809  BP: 137/83 134/78 137/85 (!) 146/93  Pulse: (!) 107 (!) 104 93 92  Resp: 16 18 20 15   Temp: 99.6 F (37.6 C) 99 F (37.2 C) 99.1 F (37.3 C) 99 F (37.2 C)  TempSrc: Oral Oral Oral Oral  SpO2: 98% 100% 99% 100%  Weight:      Height:        Intake/Output Summary (Last 24 hours) at 07/29/2022 1308 Last data filed at 07/29/2022 0900 Gross per 24 hour  Intake 360 ml  Output 600 ml  Net -240 ml     Wt Readings from Last 3 Encounters:  07/27/22 99.8 kg  07/18/22 98.2 kg  06/24/22 102.1 kg     Exam General: Alert and oriented x 3, NAD Cardiovascular: S1 S2 auscultated,  RRR Respiratory: Clear to auscultation bilaterally, no wheezing Gastrointestinal: Soft, nontender, nondistended, + bowel sounds Ext: no pedal edema bilaterally Neuro: no new deficits Skin: Bilateral feet and toes gangrene  Psych: Normal affect and demeanor, alert and oriented x3     Data Reviewed:  I have personally reviewed following labs    CBC Lab Results  Component Value Date   WBC 7.7 07/29/2022   RBC 3.02 (L) 07/29/2022   HGB 9.9 (L) 07/29/2022   HCT 31.1 (L) 07/29/2022   MCV 103.0 (H) 07/29/2022   MCH 32.8 07/29/2022   PLT 292 07/29/2022   MCHC 31.8 07/29/2022   RDW 13.2 07/29/2022   LYMPHSABS 1.9 07/29/2022   MONOABS 0.5 07/29/2022   EOSABS 0.1 07/29/2022   BASOSABS 0.0 07/29/2022     Last metabolic panel Lab Results  Component Value Date   NA 136 07/29/2022   K 3.2 (L) 07/29/2022   CL 104 07/29/2022   CO2 23 07/29/2022   BUN <5 (L) 07/29/2022   CREATININE 0.83 07/29/2022   GLUCOSE 91 07/29/2022   GFRNONAA >60 07/29/2022   GFRAA >60 12/07/2018   CALCIUM 8.5 (L) 07/29/2022   PHOS 4.0  07/29/2022   PROT 6.4 (L) 07/29/2022  ALBUMIN 2.8 (L) 07/29/2022   BILITOT 0.8 07/29/2022   ALKPHOS 53 07/29/2022   AST 23 07/29/2022   ALT 16 07/29/2022   ANIONGAP 9 07/29/2022    CBG (last 3)  No results for input(s): "GLUCAP" in the last 72 hours.    Coagulation Profile: No results for input(s): "INR", "PROTIME" in the last 168 hours.   Radiology Studies: I have personally reviewed the imaging studies  VAS [place] ABI WITH/WO TBI  Result Date: 07/28/2022  LOWER EXTREMITY DOPPLER STUDY Patient Name:  Jael Kostick Mantia  Date of Exam:   07/28/2022 Medical Rec #: [id number]            Accession #:    [id number] Date of Birth: [birth year]            Patient Gender: M Patient Age:   73 years Exam Location:  [facility] [place] Procedure:      VAS [place] ABI WITH/WO TBI Referring Phys: MARCUS DUDA --------------------------------------------------------------------------------  Indications: PVD with gangrene High Risk Factors: Hypertension, Diabetes.  Limitations: Today's exam was limited due to wound. Comparison Study: no prior Performing Technologist: [place] Ponder RVS  Examination Guidelines: A complete evaluation includes at minimum, Doppler waveform signals and systolic blood pressure reading at the level of bilateral brachial, anterior tibial, and posterior tibial arteries, when vessel segments are accessible. Bilateral testing is considered an integral part of a complete examination. Photoelectric Plethysmograph (PPG) waveforms and toe systolic pressure readings are included as required and additional duplex testing as needed. Limited examinations for reoccurring indications may be performed as noted.  ABI Findings: +---------+------------------+-----+---------+---------------------------------+ Right    Rt Pressure (mmHg)IndexWaveform Comment                           +---------+------------------+-----+---------+---------------------------------+ Brachial 150                     triphasic                                  +---------+------------------+-----+---------+---------------------------------+ PTA      147               0.98 triphasic                                  +---------+------------------+-----+---------+---------------------------------+ DP       176               1.17 triphasic                                  +---------+------------------+-----+---------+---------------------------------+ Great Toe                                unable to obtain great toe                                                 pressure due to wound             +---------+------------------+-----+---------+---------------------------------+ +---------+------------------+-----+---------+---------------------------------+ Left     Lt Pressure (mmHg)IndexWaveform Comment                           +---------+------------------+-----+---------+---------------------------------+  Brachial 141                    triphasic                                  +---------+------------------+-----+---------+---------------------------------+ PTA      146               0.97 triphasic                                  +---------+------------------+-----+---------+---------------------------------+ DP       178               1.19 triphasic                                  +---------+------------------+-----+---------+---------------------------------+ Great Toe                                unable to obtain great toe                                                 pressure due to wound             +---------+------------------+-----+---------+---------------------------------+ +-------+-----------+-----------+------------+------------+ ABI/TBIToday's ABIToday's TBIPrevious ABIPrevious TBI +-------+-----------+-----------+------------+------------+ Right  1.17                                            +-------+-----------+-----------+------------+------------+ Left   1.19                                           +-------+-----------+-----------+------------+------------+  Summary: Right: Resting right ankle-brachial index is within normal range. Left: Resting left ankle-brachial index is within normal range. *See table(s) above for measurements and observations.  Electronically signed by Deitra Mayo MD on 07/28/2022 at 4:19:42 PM.    Final    DG Foot Complete Left  Result Date: 07/27/2022 CLINICAL DATA:  Wounds EXAM: LEFT FOOT - COMPLETE 3 VIEW COMPARISON:  None Available. FINDINGS: There is no evidence of fracture or dislocation. There is no evidence of arthropathy or other focal bone abnormality. Soft tissue swelling no especially of the great toe. IMPRESSION: Soft tissue swelling.  No osseous abnormalities identified. Electronically Signed   By: Sammie Bench M.D.   On: 07/27/2022 15:28   DG Foot Complete Right  Result Date: 07/27/2022 CLINICAL DATA:  Extensive pole wounds EXAM: RIGHT FOOT COMPLETE - 3+ VIEW COMPARISON:  None Available. FINDINGS: No fracture or malalignment. No periostitis or bone destruction. Negative for soft tissue emphysema. Soft tissue ulcers or wounds at the tips of the first and second digits. IMPRESSION: No acute osseous abnormality. Electronically Signed   By: Donavan Foil M.D.   On: 07/27/2022 15:26   DG Chest 2 View  Result Date: 07/27/2022 CLINICAL DATA:  Concern for aspiration EXAM: CHEST - 2 VIEW COMPARISON:  Chest x-ray dated July 12, 2022  FINDINGS: The heart size and mediastinal contours are within normal limits. Both lungs are clear. The visualized skeletal structures are unremarkable. IMPRESSION: No active cardiopulmonary disease. Electronically Signed   By: Yetta Glassman M.D.   On: 07/27/2022 15:26       Nivaan Dicenzo M.D. [facility] 07/29/2022, 1:08 PM  Available via Epic secure chat 7am-7pm After 7 pm, please refer to night  coverage provider listed on amion.
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
## 2022-07-29 NOTE — Progress Notes (Signed)
Initial Nutrition Assessment  DOCUMENTATION CODES:   Not applicable  INTERVENTION:   Ensure Enlive po TID, each supplement provides 350 kcal and 20 grams of protein.  MVI, folic acid and thiamine po daily   Pt at high refeed risk; recommend monitor potassium, magnesium and phosphorus labs daily until stable  NUTRITION DIAGNOSIS:   Predicted suboptimal nutrient intake related to social / environmental circumstances (etoh abuse) as evidenced by other (comment) (per chart review).  GOAL:   Patient will meet greater than or equal to 90% of their needs  MONITOR:   PO intake, Supplement acceptance, I & O's, Skin, Labs, Weight trends  REASON FOR ASSESSMENT:   Malnutrition Screening Tool    ASSESSMENT:   32 y/o male with h/o etoh abuse, HTN, MDD, GERD, PE and seizures who is admitted with alcohol intoxication.  RD working remotely.  Unable to reach pt by phone. Per chart review, RD suspects pt with decreased oral intake at baseline r/t heavy alcohol use. Pt is documented to be eating 100% of meals in hospital. RD will add supplements to help pt meet his estimated needs. Pt is at high refeed risk. RD will obtain nutrition related history and exam at follow up.   Per chart, pt appears weight stable at baseline.   Medications reviewed and include: doxycycline, folic acid, MVI, thiamine, NaCl @100ml /hr   Labs reviewed: K 3.2(L), BUN <5(L), P 4.0 wnl, Mg 1.6(L) Hgb 9.9(L), Hct 31.1(L) Cbgs- 116, 125, 106, 98, 95 x 24 hrs  AIC 5.3- 12/23  NUTRITION - FOCUSED PHYSICAL EXAM: Unable to perform at this time   Diet Order:   Diet Order             Diet regular Room service appropriate? Yes; Fluid consistency: Thin  Diet effective now                  EDUCATION NEEDS:   Not appropriate for education at this time  Skin:  Skin Assessment: Reviewed RN Assessment (open wounds to b/l feet, great toes and pad of the foot)  Last BM:     Height:   Ht Readings from Last 1  Encounters:  07/27/22 6\' 2"  (1.88 m)    Weight:   Wt Readings from Last 1 Encounters:  07/27/22 99.8 kg    Ideal Body Weight:  86.3 kg  BMI:  Body mass index is 28.25 kg/m.  Estimated Nutritional Needs:   Kcal:  2700-3000kcal/day  Protein:  >135g/day  Fluid:  2.7-3.0L/day  Koleen Distance MS, RD, LDN Please refer to [facility] for RD and/or RD on-call/weekend/after hours pager

## 2022-07-29 NOTE — Consult Note (Signed)
WOC Nurse Consult Note: Patient consult for bilateral toe wounds. Photodocumentation of wounds is provided to the EMR by Provider.  Seen by this writer for this problem on 07/12/22 when I provided wound care guidance and recommended Podiatric Medicine consult. Podiatry (Dr. Sherryle Lis saw patient on 1/11 and recommended continuation of topical care ordered by this writer, two weeks of antibiotic therapy and agreed to see patient in follow up at the podiatry clinic or recommended the outpatient wound care center. Please see Dr. Maxie Barb note from that Encounter.  I have today provided nursing with orders for the topical care of these wounds and have added bilateral Prevalon boots for pressure redistribution to the heels and a sacral prophylactic foam dressing to the sacrum for PI prevention in that area.  [facility] nursing team will not follow, but will remain available to this patient, the nursing and medical teams.  Please re-consult if needed.  Thank you for inviting [place] to participate in this patient's Plan of Care.  Maudie Flakes, MSN, RN, CNS, [place], Serita Grammes, [employer], [employer] phone:  [phone]

## 2022-07-30 ENCOUNTER — Other Ambulatory Visit (HOSPITAL_COMMUNITY): Payer: Self-pay

## 2022-07-30 LAB — COMPREHENSIVE METABOLIC PANEL
ALT: 16 U/L (ref 0–44)
AST: 25 U/L (ref 15–41)
Albumin: 2.9 g/dL — ABNORMAL LOW (ref 3.5–5.0)
Alkaline Phosphatase: 55 U/L (ref 38–126)
Anion gap: 9 (ref 5–15)
BUN: <5 mg/dL — ABNORMAL LOW (ref 6–20)
CO2: 18 mmol/L — ABNORMAL LOW (ref 22–32)
Calcium: 8.1 mg/dL — ABNORMAL LOW (ref 8.9–10.3)
Chloride: 109 mmol/L (ref 98–111)
Creatinine, Ser: 0.62 mg/dL (ref 0.61–1.24)
GFR, Estimated: >60 mL/min (ref >60)
Glucose, Bld: 102 mg/dL — ABNORMAL HIGH (ref 70–99)
Potassium: 3.3 mmol/L — ABNORMAL LOW (ref 3.5–5.1)
Sodium: 136 mmol/L (ref 135–145)
Total Bilirubin: 0.3 mg/dL (ref 0.3–1.2)
Total Protein: 6.1 g/dL — ABNORMAL LOW (ref 6.5–8.1)

## 2022-07-30 LAB — CBC WITH DIFFERENTIAL/PLATELET
Abs Immature Granulocytes: 0.04 10*3/uL (ref 0.00–0.07)
Basophils Absolute: 0 10*3/uL (ref 0.0–0.1)
Basophils Relative: 0 %
Eosinophils Absolute: 0.1 10*3/uL (ref 0.0–0.5)
Eosinophils Relative: 2 %
HCT: 30 % — ABNORMAL LOW (ref 39.0–52.0)
Hemoglobin: 9.5 g/dL — ABNORMAL LOW (ref 13.0–17.0)
Immature Granulocytes: 1 %
Lymphocytes Relative: 26 %
Lymphs Abs: 1.5 10*3/uL (ref 0.7–4.0)
MCH: 32.8 pg (ref 26.0–34.0)
MCHC: 31.7 g/dL (ref 30.0–36.0)
MCV: 103.4 fL — ABNORMAL HIGH (ref 80.0–100.0)
Monocytes Absolute: 0.4 10*3/uL (ref 0.1–1.0)
Monocytes Relative: 8 %
Neutro Abs: 3.8 10*3/uL (ref 1.7–7.7)
Neutrophils Relative %: 63 %
Platelets: 228 10*3/uL (ref 150–400)
RBC: 2.9 MIL/uL — ABNORMAL LOW (ref 4.22–5.81)
RDW: 13.2 % (ref 11.5–15.5)
WBC: 5.8 10*3/uL (ref 4.0–10.5)
nRBC: 0 % (ref 0.0–0.2)

## 2022-07-30 LAB — PHOSPHORUS: Phosphorus: 2.9 mg/dL (ref 2.5–4.6)

## 2022-07-30 LAB — MAGNESIUM: Magnesium: 1.4 mg/dL — ABNORMAL LOW (ref 1.7–2.4)

## 2022-07-30 MED ORDER — SILVER SULFADIAZINE 1 % EX CREA
TOPICAL_CREAM | Freq: Every day | CUTANEOUS | 4 refills | Status: AC
  Filled 2022-07-30: qty 50, 30d supply, fill #0

## 2022-07-30 MED ORDER — APIXABAN 5 MG PO TABS
5.0000 mg | ORAL_TABLET | Freq: Two times a day (BID) | ORAL | 3 refills | Status: AC
  Filled 2022-07-30: qty 60, 30d supply, fill #0

## 2022-07-30 MED ORDER — LEVETIRACETAM 500 MG PO TABS
500.0000 mg | ORAL_TABLET | Freq: Two times a day (BID) | ORAL | 3 refills | Status: AC
  Filled 2022-07-30: qty 60, 30d supply, fill #0

## 2022-07-30 MED ORDER — MAGNESIUM SULFATE 50 % IJ SOLN
3.0000 g | Freq: Once | INTRAVENOUS | Status: DC
  Filled 2022-07-30: qty 6

## 2022-07-30 MED ORDER — MAGNESIUM OXIDE -MG SUPPLEMENT 400 (240 MG) MG PO TABS: 400.0000 mg | ORAL_TABLET | Freq: Every day | ORAL | Status: DC

## 2022-07-30 MED ORDER — ONDANSETRON HCL 4 MG PO TABS
4.0000 mg | ORAL_TABLET | Freq: Three times a day (TID) | ORAL | 0 refills | Status: AC | PRN
  Filled 2022-07-30: qty 30, 10d supply, fill #0

## 2022-07-30 MED ORDER — VITAMIN B-1 100 MG PO TABS
100.0000 mg | ORAL_TABLET | Freq: Every day | ORAL | 0 refills | Status: AC
  Filled 2022-07-30: qty 100, 100d supply, fill #0

## 2022-07-30 MED ORDER — DOXYCYCLINE HYCLATE 100 MG PO TABS
100.0000 mg | ORAL_TABLET | Freq: Two times a day (BID) | ORAL | 0 refills | Status: AC
  Filled 2022-07-30: qty 28, 14d supply, fill #0

## 2022-07-30 MED ORDER — HYDRALAZINE HCL 20 MG/ML IJ SOLN: 10.0000 mg | Freq: Four times a day (QID) | INTRAMUSCULAR | Status: DC | PRN

## 2022-07-30 MED ORDER — LISINOPRIL 20 MG PO TABS
20.0000 mg | ORAL_TABLET | Freq: Every day | ORAL | 3 refills | Status: AC
  Filled 2022-07-30: qty 30, 30d supply, fill #0

## 2022-07-30 MED ORDER — MAGNESIUM OXIDE -MG SUPPLEMENT 400 (240 MG) MG PO TABS
400.0000 mg | ORAL_TABLET | Freq: Every day | ORAL | 0 refills | Status: AC
  Filled 2022-07-30: qty 30, 30d supply, fill #0

## 2022-07-30 MED ORDER — METOPROLOL TARTRATE 25 MG PO TABS: 25.0000 mg | ORAL_TABLET | Freq: Two times a day (BID) | ORAL | Status: DC

## 2022-07-30 MED ORDER — METOPROLOL TARTRATE 25 MG PO TABS
25.0000 mg | ORAL_TABLET | Freq: Two times a day (BID) | ORAL | 3 refills | Status: AC
  Filled 2022-07-30: qty 60, 30d supply, fill #0

## 2022-07-30 MED ORDER — PANTOPRAZOLE SODIUM 40 MG PO TBEC
40.0000 mg | DELAYED_RELEASE_TABLET | Freq: Every day | ORAL | 3 refills | Status: AC
  Filled 2022-07-30: qty 30, 30d supply, fill #0

## 2022-07-30 MED ORDER — POTASSIUM CHLORIDE CRYS ER 20 MEQ PO TBCR: 40.0000 meq | EXTENDED_RELEASE_TABLET | Freq: Once | ORAL | Status: DC

## 2022-07-30 NOTE — Plan of Care (Signed)
  Problem: Nutrition: Goal: Adequate nutrition will be maintained Outcome: Progressing   Problem: Pain Managment: Goal: General experience of comfort will improve Outcome: Progressing   Problem: Safety: Goal: Ability to remain free from injury will improve Outcome: Progressing

## 2022-07-30 NOTE — Discharge Summary (Signed)
Physician Discharge Summary   Patient: Brett Molina MRN: [id number] DOB: 1991-01-21  Admit date:     07/27/2022  Discharge date: 07/30/22  Discharge Physician: Thad Ranger, MD    PCP: Pcp, No   Recommendations at discharge:   Continue doxycycline 100 mg p.o. twice daily for 2 weeks Patient recommended strongly to follow-up with Dr. Lajoyce Corners within next 7 to 10 days, further management will be decided by Dr. 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 Patient reported that ever since his [birth year], he had been drinking 12 to 18, 12 ounce beers every day.  Stated that he is going through divorce which is causing him mental distress, has a history of alcohol abuse and has been admitted in the past for withdrawals.  Ordered since drinking he has been having persistent nausea and vomiting every day, no abdominal pain diarrhea, shortness of breath or chest pain. Also reported bilateral feet wounds, reported that he was recently placed on antibiotics for his wounds however did not complete his antibiotics due to nausea and vomiting.     Assessment and Plan:  Alcohol withdrawal delirium (HCC), alcohol intoxication -Patient was placed on CIWA protocol with Ativan, thiamine, folate, MVI -Currently stable, not in acute withdrawals. -Counseled strongly on alcohol cessation      Gangrene of bilateral feet and toes - Dr Joseph Art d/w Dr Lajoyce Corners who recommended continue dressing changes to the ulcers and wear postop shoes.  Follow-up in the office.  Patient needs  time to see what will heal without surgical intervention. -Wound care consulted, appreciate recommendations -Initially started on IV vancomycin and Zosyn, transition to oral doxycycline for 2 weeks.  Patient strongly recommended to follow-up with Dr. Lajoyce Corners in office for further management.    History of prior seizures, alcohol withdrawal seizure with complication (HCC) - no acute seizures.  Patient was continued on Keppra       HTN (hypertension) -BP currently stable, continue lisinopril, metoprolol     Alcohol abuse -Counseled strongly on alcohol cessation    History of pulmonary embolism -Continue eliquis   Hypomagnesemia Continue magnesium supplementation      Pain control - [place] Controlled Substance Reporting System database was reviewed. and patient was instructed, not to drive, operate heavy machinery, perform activities at heights, swimming or participation in water activities or provide baby-sitting services while on Pain, Sleep and Anxiety Medications; until their outpatient Physician has advised to do so again. Also recommended to not to take more than prescribed Pain, Sleep and Anxiety Medications.  Consultants: Dr. Lajoyce Corners, wound care Procedures performed: None Disposition: Home Diet recommendation:  Discharge Diet Orders (From admission, onward)     Start     Ordered   07/30/22 0000  Diet - low sodium heart healthy        07/30/22 1122            DISCHARGE MEDICATION: Allergies as of 07/30/2022   No Known Allergies      Medication List     STOP taking these medications    amoxicillin-clavulanate 875-125  MG tablet Commonly known as: AUGMENTIN       TAKE these medications    apixaban 5 MG Tabs tablet Commonly known as: ELIQUIS Take 1 tablet (5 mg total) by mouth 2 (two) times daily.   doxycycline 100 MG tablet Commonly known as: VIBRA-TABS Take 1 tablet (100 mg total) by mouth 2 (two) times daily for 14 days.   levETIRAcetam 500 MG  tablet Commonly known as: Keppra Take 1 tablet (500 mg total) by mouth 2 (two) times daily.   lisinopril 20 MG tablet Commonly known as: ZESTRIL Take 1 tablet (20 mg total) by mouth daily. LF 06-24-22 # 30   magnesium oxide 400 (240 Mg) MG tablet Commonly known as: MAG-OX Take 1 tablet (400 mg total) by mouth daily.   metoprolol tartrate 25 MG tablet Commonly known as: LOPRESSOR Take 1 tablet (25 mg total) by mouth 2 (two) times daily.   ondansetron 4 MG tablet Commonly known as: Zofran Take 1 tablet (4 mg total) by mouth every 8 (eight) hours as needed for nausea or vomiting.   pantoprazole 40 MG tablet Commonly known as: Protonix Take 1 tablet (40 mg total) by mouth daily.   silver sulfADIAZINE 1 % cream Commonly known as: SILVADENE Apply topically daily. Apply Silvadene plus 4 x 4 gauze plus an Ace wrap to both feet ulcerative areas and change daily. Wear postoperative shoes for ambulation.   thiamine 100 MG tablet Commonly known as: Vitamin B-1 Take 1 tablet (100 mg total) by mouth daily. Start taking on: July 31, 2022               Discharge Care Instructions  (From admission, onward)           Start     Ordered   07/30/22 0000  Discharge wound care:       Comments: Apply Silvadene plus 4 x 4 gauze plus an Ace wrap to both feet ulcerative areas and change daily. Wear postoperative shoes for ambulation.   07/30/22 1122            Follow-up Information     Nadara Mustard, MD Follow up in 1 week(s).   Specialty: Orthopedic Surgery Contact information: [address] [place] [address] [phone]         [facility]. Call.   Why: Please call to schedule an appointment to establish primary care services Contact information: [address] [place] [address] [place] [address] [phone]               Discharge Exam: [name] Weights   07/27/22 1219  Weight: 99.8 kg   S: Not in  any acute withdrawals, tolerating diet, no nausea or vomiting.  Has been tolerating doxycycline without difficulty.  No fevers or chills.  BP (!) 133/95 (BP Location: Left Arm)   Pulse (!) 116   Temp 98.8 F (37.1 C) (Oral)   Resp 20   Ht 6\' 2"  (1.88 m)   Wt 99.8 kg   SpO2 100%   BMI 28.25 kg/m   Physical Exam General: Alert and oriented x 3, NAD Cardiovascular: S1 S2 clear, RRR.  Respiratory: CTAB, no wheezing, rales or rhonchi Gastrointestinal: Soft, nontender, nondistended, NBS Ext: no LE edema bilaterally Neuro: no new deficits Skin: Bilateral feet dressing intact Psych: Normal affect and demeanor, alert and oriented x3    Condition at discharge: fair  The results of significant diagnostics from this hospitalization (including imaging, microbiology, ancillary and  laboratory) are listed below for reference.   Imaging Studies: VAS [place] ABI WITH/WO TBI  Result Date: 07/28/2022  LOWER EXTREMITY DOPPLER STUDY Patient Name:  Lyndel Sarate Foulks  Date of Exam:   07/28/2022 Medical Rec #: [id number]            Accession #:    [id number] Date of Birth: Oct 29, 1990            Patient Gender: M Patient Age:   24 years Exam Location:  [facility] Procedure:      VAS [place] ABI WITH/WO TBI Referring Phys: MARCUS DUDA --------------------------------------------------------------------------------  Indications: PVD with gangrene High Risk Factors: Hypertension, Diabetes.  Limitations: Today's exam was limited due to wound. Comparison Study: no prior Performing Technologist: Archie Patten RVS  Examination Guidelines: A complete evaluation includes at minimum, Doppler waveform signals and systolic blood pressure reading at the level of bilateral brachial, anterior tibial, and posterior tibial arteries, when vessel segments are accessible. Bilateral testing is considered an integral part of a complete examination. Photoelectric Plethysmograph (PPG) waveforms and toe systolic pressure readings are  included as required and additional duplex testing as needed. Limited examinations for reoccurring indications may be performed as noted.  ABI Findings: +---------+------------------+-----+---------+---------------------------------+ Right    Rt Pressure (mmHg)IndexWaveform Comment                           +---------+------------------+-----+---------+---------------------------------+ Brachial 150                    triphasic                                  +---------+------------------+-----+---------+---------------------------------+ PTA      147               0.98 triphasic                                  +---------+------------------+-----+---------+---------------------------------+ DP       176               1.17 triphasic                                  +---------+------------------+-----+---------+---------------------------------+ Great Toe                                unable to obtain great toe                                                 pressure due to wound             +---------+------------------+-----+---------+---------------------------------+ +---------+------------------+-----+---------+---------------------------------+ Left     Lt Pressure (mmHg)IndexWaveform Comment                           +---------+------------------+-----+---------+---------------------------------+ Brachial 141                    triphasic                                  +---------+------------------+-----+---------+---------------------------------+  PTA      146               0.97 triphasic                                  +---------+------------------+-----+---------+---------------------------------+ DP       178               1.19 triphasic                                  +---------+------------------+-----+---------+---------------------------------+ Great Toe                                unable to obtain great toe                                                  pressure due to wound             +---------+------------------+-----+---------+---------------------------------+ +-------+-----------+-----------+------------+------------+ ABI/TBIToday's ABIToday's TBIPrevious ABIPrevious TBI +-------+-----------+-----------+------------+------------+ Right  1.17                                           +-------+-----------+-----------+------------+------------+ Left   1.19                                           +-------+-----------+-----------+------------+------------+  Summary: Right: Resting right ankle-brachial index is within normal range. Left: Resting left ankle-brachial index is within normal range. *See table(s) above for measurements and observations.  Electronically signed by Waverly Ferrarihristopher Dickson MD on 07/28/2022 at 4:19:42 PM.    Final    DG Foot Complete Left  Result Date: 07/27/2022 CLINICAL DATA:  Wounds EXAM: LEFT FOOT - COMPLETE 3 VIEW COMPARISON:  None Available. FINDINGS: There is no evidence of fracture or dislocation. There is no evidence of arthropathy or other focal bone abnormality. Soft tissue swelling no especially of the great toe. IMPRESSION: Soft tissue swelling.  No osseous abnormalities identified. Electronically Signed   By: Layla MawJoshua  Pleasure M.D.   On: 07/27/2022 15:28   DG Foot Complete Right  Result Date: 07/27/2022 CLINICAL DATA:  Extensive pole wounds EXAM: RIGHT FOOT COMPLETE - 3+ VIEW COMPARISON:  None Available. FINDINGS: No fracture or malalignment. No periostitis or bone destruction. Negative for soft tissue emphysema. Soft tissue ulcers or wounds at the tips of the first and second digits. IMPRESSION: No acute osseous abnormality. Electronically Signed   By: Jasmine PangKim  Fujinaga M.D.   On: 07/27/2022 15:26   DG Chest 2 View  Result Date: 07/27/2022 CLINICAL DATA:  Concern for aspiration EXAM: CHEST - 2 VIEW COMPARISON:  Chest x-ray dated July 12, 2022 FINDINGS: The heart size and  mediastinal contours are within normal limits. Both lungs are clear. The visualized skeletal structures are unremarkable. IMPRESSION: No active cardiopulmonary disease. Electronically Signed   By: Allegra LaiLeah  Strickland M.D.   On: 07/27/2022 15:26   CT SOFT TISSUE NECK WO CONTRAST  Result Date: 07/13/2022 CLINICAL DATA:  Odynophagia EXAM: CT NECK WITHOUT CONTRAST TECHNIQUE: Multidetector CT imaging of the neck was performed following the standard protocol without intravenous contrast. RADIATION DOSE REDUCTION: This exam was performed according to the departmental dose-optimization program which includes automated exposure control, adjustment of the mA and/or kV according to patient size and/or use of iterative reconstruction technique. COMPARISON:  None Available. FINDINGS: Pharynx and larynx: There is mucosal thickening in the right nasal cavity. The left nasal cavity and nasopharynx are unremarkable. The oral cavity and oropharynx are unremarkable. The parapharyngeal spaces are clear. The hypopharynx and larynx are unremarkable. The vocal folds are normal in appearance. There is no retropharyngeal fluid collection. The airway is patent. Salivary glands: The parotid and submandibular glands are unremarkable. Thyroid: Unremarkable. Lymph nodes: There is no lymphadenopathy in the neck. Vascular: Unremarkable, as can be evaluated in the absence of intravenous contrast. Limited intracranial: Grossly unremarkable. Visualized orbits: Unremarkable. Mastoids and visualized paranasal sinuses: There is mild mucosal thickening in the ethmoid air cells and right maxillary sinus. The mastoid air cells and middle ear cavities are clear to the level imaged. Skeleton: There is no acute osseous abnormality or suspicious osseous lesion. Upper chest: The imaged lung apices are clear. The imaged upper esophagus is grossly unremarkable. Other: None. IMPRESSION: Unremarkable noncontrast CT of the neck with no acute finding. Electronically  Signed   By: Lesia Hausen M.D.   On: 07/13/2022 18:32   DG Foot Complete Right  Result Date: 07/12/2022 CLINICAL DATA:  Fever, post seizures. EXAM: RIGHT FOOT COMPLETE - 3+ VIEW COMPARISON:  None Available. FINDINGS: There is no evidence of fracture or dislocation. There is no evidence of arthropathy or other focal bone abnormality. Soft tissues swelling about the digits. IMPRESSION: Soft tissue swelling about the digits. No fracture or dislocation. Electronically Signed   By: Larose Hires D.O.   On: 07/12/2022 18:26   DG Foot Complete Left  Result Date: 07/12/2022 CLINICAL DATA:  Fever, post seizures. EXAM: LEFT FOOT - COMPLETE 3+ VIEW COMPARISON:  None Available. FINDINGS: There is no evidence of fracture or dislocation. There is no evidence of arthropathy or other focal bone abnormality. Soft tissues swelling about the digits. IMPRESSION: Soft tissue swelling about the digits, correlate with physical examination findings. No fracture or dislocation. Electronically Signed   By: Larose Hires D.O.   On: 07/12/2022 18:25   DG Chest Port 1 View  Result Date: 07/12/2022 CLINICAL DATA:  Post seizures. EXAM: PORTABLE CHEST 1 VIEW COMPARISON:  Chest radiograph dated June 19, 2022. FINDINGS: The heart size and mediastinal contours are within normal limits. Both lungs are clear. The visualized skeletal structures are unremarkable. IMPRESSION: No active disease. Electronically Signed   By: Larose Hires D.O.   On: 07/12/2022 18:24   CT Head Wo Contrast  Result Date: 07/12/2022 CLINICAL DATA:  Mental status change, unknown cause EXAM: CT HEAD WITHOUT CONTRAST TECHNIQUE: Contiguous axial images were obtained from the base of the skull through the vertex without intravenous contrast. RADIATION DOSE REDUCTION: This exam was performed according to the departmental dose-optimization program which includes automated exposure control, adjustment of the mA and/or kV according to patient size and/or use of iterative  reconstruction technique. COMPARISON:  06/18/2022 FINDINGS: Brain: No evidence of acute infarction, hemorrhage, hydrocephalus, extra-axial collection or mass lesion/mass effect. Vascular: No hyperdense vessel or unexpected calcification. Skull: Normal. Negative for fracture or focal lesion. Sinuses/Orbits: No acute finding. IMPRESSION: No acute intracranial process. Electronically Signed   By: Charlaine Dalton.D.  On: 07/12/2022 16:58    Microbiology: Results for orders placed or performed during the hospital encounter of 07/27/22  Resp panel by RT-PCR (RSV, Flu A&B, Covid) Anterior Nasal Swab     Status: None   Collection Time: 07/27/22  2:58 PM   Specimen: Anterior Nasal Swab  Result Value Ref Range Status   SARS Coronavirus 2 by RT PCR NEGATIVE NEGATIVE Final    Comment: (NOTE) SARS-CoV-2 target nucleic acids are NOT DETECTED.  The SARS-CoV-2 RNA is generally detectable in upper respiratory specimens during the acute phase of infection. The lowest concentration of SARS-CoV-2 viral copies this assay can detect is 138 copies/mL. A negative result does not preclude SARS-Cov-2 infection and should not be used as the sole basis for treatment or other patient management decisions. A negative result may occur with  improper specimen collection/handling, submission of specimen other than nasopharyngeal swab, presence of viral mutation(s) within the areas targeted by this assay, and inadequate number of viral copies(<138 copies/mL). A negative result must be combined with clinical observations, patient history, and epidemiological information. The expected result is Negative.  Fact Sheet for Patients:  EntrepreneurPulse.com.au  Fact Sheet for Healthcare Providers:  IncredibleEmployment.be  This test is no t yet approved or cleared by the [place] FDA and  has been authorized for detection and/or diagnosis of SARS-CoV-2 by FDA under an Emergency  Use Authorization (EUA). This EUA will remain  in effect (meaning this test can be used) for the duration of the COVID-19 declaration under Section 564(b)(1) of the Act, 21 U.S.C.section 360bbb-3(b)(1), unless the authorization is terminated  or revoked sooner.       Influenza A by PCR NEGATIVE NEGATIVE Final   Influenza B by PCR NEGATIVE NEGATIVE Final    Comment: (NOTE) The Xpert Xpress SARS-CoV-2/FLU/RSV plus assay is intended as an aid in the diagnosis of influenza from Nasopharyngeal swab specimens and should not be used as a sole basis for treatment. Nasal washings and aspirates are unacceptable for Xpert Xpress SARS-CoV-2/FLU/RSV testing.  Fact Sheet for Patients: EntrepreneurPulse.com.au  Fact Sheet for Healthcare Providers: IncredibleEmployment.be  This test is not yet approved or cleared by the [place] FDA and has been authorized for detection and/or diagnosis of SARS-CoV-2 by FDA under an Emergency Use Authorization (EUA). This EUA will remain in effect (meaning this test can be used) for the duration of the COVID-19 declaration under Section 564(b)(1) of the Act, 21 U.S.C. section 360bbb-3(b)(1), unless the authorization is terminated or revoked.     Resp Syncytial Virus by PCR NEGATIVE NEGATIVE Final    Comment: (NOTE) Fact Sheet for Patients: EntrepreneurPulse.com.au  Fact Sheet for Healthcare Providers: IncredibleEmployment.be  This test is not yet approved or cleared by the [place] FDA and has been authorized for detection and/or diagnosis of SARS-CoV-2 by FDA under an Emergency Use Authorization (EUA). This EUA will remain in effect (meaning this test can be used) for the duration of the COVID-19 declaration under Section 564(b)(1) of the Act, 21 U.S.C. section 360bbb-3(b)(1), unless the authorization is terminated or revoked.  Performed at [facility],  [place] [address] [place], [place] [address]   Culture, blood (Routine X 2) w Reflex to ID Panel     Status: None (Preliminary result)   Collection Time: 07/27/22  4:16 PM   Specimen: BLOOD  Result Value Ref Range Status   Specimen Description   Final    BLOOD LEFT ANTECUBITAL Performed at [facility] [address]  [place], [place] [address]    Special Requests   Final    BOTTLES DRAWN AEROBIC AND ANAEROBIC Blood Culture results may not be optimal due to an excessive volume of blood received in culture bottles Performed at [facility], [address], [place], [place] [address]    Culture   Final    NO GROWTH 3 DAYS Performed at [facility], [address] [place], [place] [address]    Report Status PENDING  Incomplete  Group A Strep by PCR     Status: None   Collection Time: 07/27/22  4:56 PM   Specimen: Throat; Sterile Swab  Result Value Ref Range Status   Group A Strep by PCR NOT DETECTED NOT DETECTED Final    Comment: Performed at [facility], [address], [place], [place] [address]  Culture, blood (Routine X 2) w Reflex to ID Panel     Status: None (Preliminary result)   Collection Time: 07/27/22  5:01 PM   Specimen: BLOOD LEFT HAND  Result Value Ref Range Status   Specimen Description   Final    BLOOD LEFT HAND Performed at [facility], [address] [place], [place] [address]    Special Requests   Final    BOTTLES DRAWN AEROBIC AND ANAEROBIC Blood Culture results may not be optimal due to an excessive volume of blood received in culture bottles Performed at [facility], [address], [place], [place] [address]    Culture   Final    NO GROWTH 3 DAYS Performed at [facility], [address] [place], [place] [address]    Report Status PENDING  Incomplete    Labs: CBC: Recent Labs  Lab 07/27/22 1222 07/28/22 0624 07/29/22 0554 07/30/22 0545  WBC 9.5  6.7 7.7 5.8  NEUTROABS  --  4.8 5.2 3.8  HGB 11.7* 9.9* 9.9* 9.5*  HCT 34.4* 29.9* 31.1* 30.0*  MCV 95.8 101.4* 103.0* 103.4*  PLT 483* 312 292 228   Basic Metabolic Panel: Recent Labs  Lab 07/27/22 1222 07/28/22 0624 07/29/22 0554 07/30/22 0545  NA 139 136 136 136  K 3.4* 3.8 3.2* 3.3*  CL 97* 101 104 109  CO2 25 26 23  18*  GLUCOSE 127* 86 91 102*  BUN <5* <5* <5* <5*  CREATININE 0.72 0.84 0.83 0.62  CALCIUM 9.0 8.5* 8.5* 8.1*  MG 1.8 1.7 1.6* 1.4*  PHOS  --  4.3 4.0 2.9   Liver Function Tests: Recent Labs  Lab 07/27/22 1222 07/28/22 0624 07/29/22 0554 07/30/22 0545  AST 31 22 23 25   ALT 24 17 16 16   ALKPHOS 67 56 53 55  BILITOT 0.5 1.1 0.8 0.3  PROT 7.8 6.2* 6.4* 6.1*  ALBUMIN 3.5 2.7* 2.8* 2.9*   CBG: No results for input(s): "GLUCAP" in the last 168 hours.  Discharge time spent: greater than 30 minutes.  Signed: 08/01/22, MD [facility] 07/30/2022

## 2022-07-30 NOTE — TOC Transition Note (Signed)
Transition of Care [facility]) - CM/SW Discharge Note   Patient Details  Name: Brett Molina MRN: [id number] Date of Birth: 01-06-1991  Transition [facility] CM/SW Contact:  Vassie Moselle, LCSW Phone Number: 07/30/2022, 1:11 PM   Clinical Narrative:    Pt reports having no money to pay for medication. Pt received medications using MATCH voucher on 07/18/2021 and is ineligible to use [facility] program again until January 2025. Pt has medications with him from last admission to the hospital and will only need newly prescribed medications. Pharmacy is to deliver medications to room and will bill pt's account for medication costs.  Pt reports plan to go to his apartment to pick up his belongings and then will be going to shelter. Pt reports not living on a bus line. Taxi voucher provided to RN for transportation at discharge.    Final next level of care: Homeless Shelter Barriers to Discharge: Barriers Resolved   Patient Goals and CMS Choice CMS Medicare.gov Compare Post Acute Care list provided to:: Patient Choice offered to / list presented to : Patient  Discharge Placement                         Discharge Plan and Services Additional resources added to the After Visit Summary for   In-house Referral: Clinical Social Work, PCP / Psychologist, educational, Development worker, community Discharge Planning Services: CM Consult Post Acute Care Choice: NA          DME Arranged: N/A DME Agency: NA                  Social Determinants of Health (SDOH) Interventions SDOH Screenings   Food Insecurity: No Food Insecurity (07/28/2022)  Housing: High Risk (07/28/2022)  Transportation Needs: Unmet Transportation Needs (07/28/2022)  Utilities: Not At Risk (07/28/2022)  Depression (PHQ2-9): Low Risk  (12/27/2018)  Tobacco Use: Low Risk  (07/27/2022)     Readmission Risk Interventions    07/29/2022    2:20 PM  Readmission Risk Prevention Plan  Transportation Screening Complete  PCP or  Specialist Appt within 3-5 Days Complete  HRI or [place] Complete  Social Work Consult for [place] Planning/Counseling Complete  Palliative Care Screening Not Applicable  Medication Review Press photographer) Complete

## 2022-07-31 ENCOUNTER — Other Ambulatory Visit (HOSPITAL_COMMUNITY): Payer: Self-pay

## 2022-07-31 LAB — LEVETIRACETAM LEVEL: Levetiracetam Lvl: 7.1 ug/mL — ABNORMAL LOW (ref 10.0–40.0)

## 2022-08-01 LAB — CULTURE, BLOOD (ROUTINE X 2)
Culture: NO GROWTH
Culture: NO GROWTH

## 2022-08-12 ENCOUNTER — Ambulatory Visit: Payer: Medicaid Other | Attending: Family Medicine | Admitting: Family Medicine

## 2023-09-11 ENCOUNTER — Other Ambulatory Visit: Payer: Self-pay

## 2023-09-11 ENCOUNTER — Ambulatory Visit (HOSPITAL_COMMUNITY): Admission: EM | Admit: 2023-09-11 | Discharge: 2023-09-11 | Disposition: A | Discharge status: Home / Self Care

## 2023-09-11 ENCOUNTER — Emergency Department (HOSPITAL_COMMUNITY)
Admission: EM | Admit: 2023-09-11 | Discharge: 2023-09-12 | Disposition: A | Discharge status: Home / Self Care | Attending: Emergency Medicine | Admitting: Emergency Medicine

## 2023-09-11 ENCOUNTER — Encounter (HOSPITAL_COMMUNITY): Payer: Self-pay | Admitting: Emergency Medicine

## 2023-09-11 DIAGNOSIS — F10220 Alcohol dependence with intoxication, uncomplicated: Secondary | ICD-10-CM | POA: Diagnosis not present

## 2023-09-11 DIAGNOSIS — F10920 Alcohol use, unspecified with intoxication, uncomplicated: Secondary | ICD-10-CM | POA: Insufficient documentation

## 2023-09-11 DIAGNOSIS — F1010 Alcohol abuse, uncomplicated: Secondary | ICD-10-CM | POA: Diagnosis not present

## 2023-09-11 DIAGNOSIS — Z7901 Long term (current) use of anticoagulants: Secondary | ICD-10-CM | POA: Diagnosis not present

## 2023-09-11 DIAGNOSIS — F10129 Alcohol abuse with intoxication, unspecified: Secondary | ICD-10-CM | POA: Diagnosis present

## 2023-09-11 MED ORDER — CHLORDIAZEPOXIDE HCL 25 MG PO CAPS: ORAL_CAPSULE | ORAL | 0 refills | Status: DC

## 2023-09-11 MED ORDER — SODIUM CHLORIDE 0.9 % IV BOLUS
1000.0000 mL | Freq: Once | INTRAVENOUS | Status: AC
  Administered 2023-09-11: 1000 mL via INTRAVENOUS

## 2023-09-11 NOTE — Discharge Instructions (Addendum)
 Based on your evaluation today, you have been provided with a list of resources for alcohol detox programs and residential treatment facilities.   Please seek medical attention if:  Your symptoms get worse. You cannot eat or drink without vomiting. You cannot stop drinking alcohol. Get help right away if: You have fast or uneven heartbeats. You have chest pain. You have trouble breathing. You are told you had a seizure. You see, hear, feel, smell, or taste something that is not there. You get very confused. These symptoms may be an emergency. Get help right away. Call 911. Do not wait to see if the symptoms will go away. Do not drive yourself to the hospital. Summary Alcohol withdrawal syndrome is a group of symptoms that can happen when a person who drinks heavily and regularly stops drinking or drinks less. Delirium tremens (DTs) is a group of life-threatening symptoms. You should get help right away if you have these symptoms. Think about joining an alcohol support group or a treatment program. This information is not intended to replace advice given to you by your health care provider. Make sure you discuss any questions you have with your health care provider

## 2023-09-11 NOTE — Progress Notes (Signed)
   09/11/23 1156  [facility] Triage Screening (Walk-ins at [facility] only)  What Is the Reason for Your Visit/Call Today? Patient is a 33 year old male who presents voluntarily to [facility] with a desire to detox. Patient denies having any SI, HI, or AVH. Patient endorses drinking about a pint of Vodka on last evening. He reports being sober for the past 2 years. He said that situations in his life, having no coping skills caused him to begin drinking again on Monday night. He reports that he is very introverted and that in his job yesterday he was more outgoing, and it was noticed by his co-workers. Patient said that he feels less inhibited when he is drinking. Patient says that he enjoys his job and does not want to do anything to jeopardize it. He feels that since he was able to stop before, that if he is able to detox, he will be able to so do again, this with the proper resources.  How Long Has This Been Causing You Problems? <Week  Have You Recently Had Any Thoughts About Hurting Yourself? No  Are You Planning to Commit Suicide/Harm Yourself At This time? No  Have you Recently Had Thoughts About Hurting Someone Karolee Ohs? No  Are You Planning To Harm Someone At This Time? No  Physical Abuse Denies  Verbal Abuse Denies  Sexual Abuse Denies  Exploitation of patient/patient's resources Denies  Self-Neglect Denies  Are you currently experiencing any auditory, visual or other hallucinations? No  Have You Used Any Alcohol or Drugs in the Past 24 Hours? Yes  What Did You Use and How Much? Alcohol (vodka) /1 pint  Do you have any current medical co-morbidities that require immediate attention? No  Clinician description of patient physical appearance/behavior: Pt. was neatly dressed,  alert & oriented, calm and cooperative  What Do You Feel Would Help You the Most Today? Alcohol or Drug Use Treatment  If access to [facility] was not available, would you have sought care in the Emergency Department? No  Determination  of Need Urgent (48 hours)  Options For Referral Chemical Dependency Intensive Outpatient Therapy (CDIOP);Outpatient Therapy;Other: Comment (SAIOP)  Determination of Need filed? Yes

## 2023-09-11 NOTE — ED Provider Notes (Signed)
 Behavioral Health Urgent Care Medical Screening Exam  Patient Name: Brett Molina MRN: [id number] Date of Evaluation: 09/11/23 Chief Complaint:   requesting alcohol detox. Diagnosis:  Final diagnoses:  Alcohol abuse    History of Present illness: Brett Molina is a 33 y.o. male patient with a past psychiatric history significant for alcohol abuse, and alcohol use disorder and a medical history significant for alcohol withdrawal seizures, delirium tremens, hypertension, AKI, and, gangrene, encephalopathy who presented to the [facility] voluntary requesting alcohol detox.  Patient states that he was sober for a year and a half and relapsed on alcohol on Monday due to a lot of personal stress. He reports drinking 1/5 of liquor daily since Monday. He reports that he last consumed alcohol an unknown amount last night. He denies active alcohol withdrawal symptoms at this time. He reports drinking alcohol since age 11. He denies using illicit drugs.  Patient reports a history of alcohol withdrawal seizures. Per chart review, patient has an extensive history of complicated alcohol withdrawal, seizures, delirium tremens and has required medical alcohol detox. Patient's last documented alcohol withdrawal seizure was in January 2025.   On evaluation, patient is alert and oriented x 4. His thought process is linear and goal oriented. His speech is clear and coherent.  His mood is euthymic and affect is congruent. He denies SI. He denies past suicide attempts. He denies homicidal ideations. He denies auditory or visual hallucinations. There is no objective evidence that the patient is currently responding to internal or external stimuli. He reports fair sleep. He reports a decreased appetite for the past week due to alcohol use. He denies depressive symptoms. He denies a past psychiatric history other than alcohol abuse. He reports living alone. 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 He further states that his drinking has affected his job as he went to work on Wednesday after drinking alcohol and his coworkers noted that he was a little more outgoing than usual. He reports a past history of substance abuse  treatment but states that he cannot remember the last time he received substance abuse treatment. He reports a family history of substance abuse on his mother's side. He reports a medical history of hypertension and states that he does not take a blood pressure medicine. Patient noted to be mildly hypertensive and slightly tachycardic on exam. He denies shortness of breath, chest pain, headaches, nausea, vomiting, or diarrhea. Patient medically stable at this time and does not require emergent medical attention. Education provided on alcohol use, alcohol withdrawal symptoms and when to seek medical attention. Due to patient's extensive history of complicated alcohol withdrawal. Patient will most likely require a medical detox.     Flowsheet Row ED from 09/11/2023 in [facility] ED to Hosp-Admission (Discharged) from 07/27/2022 in [facility] ED to Hosp-Admission (Discharged) from 07/12/2022 in [facility]  C-SSRS RISK CATEGORY No Risk No Risk No Risk       Psychiatric Specialty Exam  Presentation  General Appearance:Appropriate for Environment  Eye Contact:Fair  Speech:Clear and Coherent  Speech Volume:Normal  Handedness:Right   Mood and Affect  Mood: Euthymic  Affect: Congruent   Thought Process  Thought Processes: Coherent; Goal Directed  Descriptions of Associations:Intact  Orientation:Full (Time, Place and Person)  Thought Content:   Hallucinations:None  Ideas of Reference:None  Suicidal Thoughts:No  Homicidal Thoughts:No   Sensorium  Memory:  Immediate Fair; Recent Fair; Remote  Fair  Judgment: Fair  Insight: Fair   Chartered certified accountant: Fair  Attention Span: Fair  Recall: [employer] of Knowledge: Fair  Language: Fair   Psychomotor Activity  Psychomotor Activity: Normal   Assets  Assets: Manufacturing systems engineer; Desire for Improvement; Housing; Leisure Time; Physical Health; Financial Resources/Insurance; Transportation   Sleep  Sleep: Fair   Physical Exam: Physical Exam HENT:     Head: Normocephalic.  Cardiovascular:     Rate and Rhythm: Normal rate.  Pulmonary:     Effort: Pulmonary effort is normal.  Musculoskeletal:        General: Normal range of motion.  Neurological:     Mental Status: He is alert and oriented to person, place, and time.    Review of Systems  Constitutional: Negative.   HENT: Negative.    Respiratory: Negative.    Cardiovascular: Negative.   Gastrointestinal: Negative.   Genitourinary: Negative.   Musculoskeletal: Negative.   Skin:        Reports a wound to left foot great toe. He refused to allow this provider to assess the wound. He denies pain or infection to healing wound.   Neurological: Negative.   Endo/Heme/Allergies: Negative.   Psychiatric/Behavioral:  Positive for substance abuse.    Blood pressure (!) 148/98, pulse (!) 110, temperature 98.1 F (36.7 C), temperature source Oral, resp. rate 18, SpO2 98%. There is no height or weight on file to calculate BMI.  Musculoskeletal: Strength & Muscle Tone: within normal limits Gait & Station: normal Patient leans: N/A   [facility] MSE Discharge Disposition for Follow up and Recommendations: Based on my evaluation the patient does not appear to have an emergency medical condition and can be discharged with resources and follow up care in outpatient services for substance abuse  Case and treatment plan discussed with Dr. Woodroe Mode.   Based on your evaluation today, you have been provided with a list of resources for alcohol detox  programs and residential treatment facilities.   Please seek medical attention if:  Your symptoms get worse. You cannot eat or drink without vomiting. You cannot stop drinking alcohol. Get help right away if: You have fast or uneven heartbeats. You have chest pain. You have trouble breathing. You are told you had a seizure. You see, hear, feel, smell, or taste something that is not there. You get very confused. These symptoms may be an emergency. Get help right away. Call 911. Do not wait to see if the symptoms will go away. Do not drive yourself to the hospital. Summary Alcohol withdrawal syndrome is a group of symptoms that can happen when a person who drinks heavily and regularly stops drinking or drinks less. Delirium tremens (DTs) is a group of life-threatening symptoms. You should get help right away if you have these symptoms. Think about joining an alcohol support group or a treatment program. This information is not intended to replace advice given to you by your health care provider. Make sure you discuss any questions you have with your health care provider.   France Noyce L, NP 09/11/2023, 1:03 PM

## 2023-09-11 NOTE — ED Notes (Signed)
Patient discharged home by provider

## 2023-09-11 NOTE — Discharge Instructions (Signed)
 Please start taking Librium for alcohol withdrawal symptoms. Please utilize the resources provided below for detox or to stay in remission.  You may also want to contact your insurance to see if they have additional resources to keep you in remission.  Read the instructions provided on alcohol withdrawal symptoms.  If your symptoms get worse, please return to the ER.   Substance Abuse Treatment Programs  Intensive Outpatient Programs [facility]     [address]      [place], [place]                   [phone]       [facility] [address] [place] [address] [place], [place] [phone]  [facility]     (Inpatient and outpatient)     [address]           [phone]    [facility] [phone] (Suboxone and Methadone)  [address]      [place], [place] [address]      [phone]       [address] [place], [place] [phone]  [facility] (Outpatient/Inpatient, Chemical)    (insurance only) [phone]             [facility] (Groups & Residential) [place], [place] [phone]     [facility]     [address]     [place], [place]      [phone]       [facility] (for caregivers and family) [phone] [place], [place] [phone]      Residential Treatment Programs [facility]      [address] [place], [place] [address]  [phone]       [facility] [address] [place], [place] [address] [phone]  [facility] [place]        [phone]       [facility] [phone]  [facility]             [address]                                         [place], [place]                                                [phone] or [phone]                               [facility] [address] [place], [address] [phone]  [facility] Treatment  Center    [address]      [place], [place]     [phone]       [facility] [address] [place], [place] [phone]  [facility]   [address] [place], [place] [address]     [phone]      Admissions: 8am-3pm M-F  [facility] ([facility]) [address] [place], [place] [phone]  [facility] Program: Residential Program [phone] Days)   [place], [place]      [phone] or [phone]  [facility]: [facility] [place], [place] (Walk in Hours over the weekend or by referral)  [facility] [address] [place], [place], [place] [address] [phone]  Crisis Mobile: [facility]:  [phone] (for crisis response 24 hours a day) [facility] Hotline:      [phone] Outpatient Psychiatry and Counseling  [facility]: Mobile Crisis Management 24 hours:  [phone]  [facility] of the [employer] sliding scale fee and walk in schedule: M-F 8am-12pm/1pm-3pm [address]  [place], [place] [address] [phone]  [facility] [address] [place], [place] [address] [phone]  [facility] (Formerly [facility] as [facility] [employer])- new patient walk-in appointments available Monday - Friday 8am -3pm.          [address] [place], [place] [address] [phone] or crisis line- [phone]  [facility] Out[facility] Services/ Intensive Outpatient Therapy Program [address] [place], [place] [address] [phone]  [facility]                  Crisis Services      [phone] [address]     [place], [place] [address]                 [facility]   [facility] [phone]. [address] [place], [place] [address]   [facility] of Care          [address]  [place], [place] [address]       [phone]  [facility] [address] [place], [place] [address] [phone]  [facility]    [address]    [place], [place] [address]     [phone]       Andee Poles, MD     [address]     [place] [address]     [phone]       [facility] [address] [place] [address]  [facility]     [address] [place], [place]      [phone]       [facility] Eulogio Ditch, MD [address] [place], [place] [address] [phone]  [facility]     [address]     [place], [place] [address]     [phone]       [facility] [address] [place], [place] [address] [phone] Resources for Temporary Residential Assistance/Crisis Centers  DAY CENTERS [facility]) M-F 8am-3pm   [address] [place], [place] [address]   [phone] Services include: laundry, barbering, support groups, case management, phone  & computer access, showers, AA/NA mtgs, mental health/substance abuse nurse, job skills class, disability information, VA assistance, spiritual classes, etc.   HOMELESS SHELTERS  [facility]     [facility]   [address] [place]     [phone]              [employer] (women and children)       [address] [place], [place] [address] [phone] [email or web address] for application and process Application Required  [facility]   [address]    [place] [address]     [phone]  [employer] [place] [address] [place], [place] [address] [phone] application appt.) Application Required  [facility] (women only)    [address]     [place], [place] [address]     [phone]      Intake starts 6pm daily Need  valid ID, SSC, & Police report [employer] [address] [place], [place] [phone] Application Required  [employer] (men only)     [address]      [place], [place]     [phone]       [facility] of the [place] (Pregnant women only) [address] [place], [place] [phone]  [facility]      [address]      [place], [place] [address]     [phone]             [facility] [address] [place], [place] [phone] 90 day commitment/SA/Application process  [facility](men only)     [address]     [place], [place]     [phone]       Check-in at [facility] of [facility] [address] [place], [place] [address] [phone] Men/Women/Women and Children must be there by 7 pm  [facility] [place], [place] [phone]

## 2023-09-11 NOTE — ED Provider Notes (Signed)
 [place] EMERGENCY DEPARTMENT AT [facility] Provider Note   CSN: [id number] Arrival date & time: 09/11/23  2037     History  Chief Complaint  Patient presents with   Alcohol Intoxication    Brett Molina is a 33 y.o. male.  HPI    33 year old patient comes in with chief complaint of detox. Patient was actually sent to the ER from behavioral health urgent care.  Patient has history of alcohol use disorder and complications from alcohol withdrawal in the past that includes seizures.  He states that he had been sober for 1 year.  He relapsed about 5 or 6 days ago.  The reason for relapse was that he just felt that he was under a lot of pressure.  Patient states that ever since he stopped drinking, actually things are going well for him.  He has a job and they are supportive.  He had gone on the date few days ago even.  However, he is also scared of making a mistake and that pressure led to him relapsing.  Patient has had prior complications from alcohol withdrawals and wanted to see if we can get detox.  He does not remember having delirium tremens, but states that he has had seizures in the past.  Patient is taking his medications as prescribed.  He has no SI, HI.  Patient's last alcoholic beverage was today.  Home Medications Prior to Admission medications   Medication Sig Start Date End Date Taking? Authorizing Provider  chlordiazePOXIDE (LIBRIUM) 25 MG capsule 50mg  PO TID x 1D, then 25-50mg  PO BID X 1D, then 25-50mg  PO QD X 1D 09/11/23  Yes Shanequia Kendrick, MD  apixaban (ELIQUIS) 5 MG TABS tablet Take 1 tablet (5 mg total) by mouth 2 (two) times daily. 07/30/22   Rai, Delene Ruffini, MD  levETIRAcetam (KEPPRA) 500 MG tablet Take 1 tablet (500 mg total) by mouth 2 (two) times daily. 07/30/22   Rai, Ripudeep K, MD  lisinopril (ZESTRIL) 20 MG tablet Take 1 tablet (20 mg total) by mouth daily. 07/30/22   Rai, Ripudeep K, MD  magnesium oxide (MAG-OX) 400 (240 Mg) MG tablet  Take 1 tablet (400 mg total) by mouth daily. 07/30/22   Rai, Delene Ruffini, MD  metoprolol tartrate (LOPRESSOR) 25 MG tablet Take 1 tablet (25 mg total) by mouth 2 (two) times daily. 07/30/22   Rai, Ripudeep K, MD  pantoprazole (PROTONIX) 40 MG tablet Take 1 tablet (40 mg total) by mouth daily. 07/30/22 11/27/22  Rai, Delene Ruffini, MD  silver sulfADIAZINE (SILVADENE) 1 % cream Apply topically daily. Apply Silvadene plus 4 x 4 gauze plus an Ace wrap to both feet ulcerative areas and change daily. Wear postoperative shoes for ambulation. 07/30/22   Rai, Delene Ruffini, MD  thiamine (VITAMIN B-1) 100 MG tablet Take 1 tablet (100 mg total) by mouth daily. 07/31/22   Cathren Harsh, MD      Allergies    Patient has no known allergies.    Review of Systems   Review of Systems  All other systems reviewed and are negative.   Physical Exam Updated Vital Signs BP (!) 144/88 (BP Location: Right Arm)   Pulse 88   Temp 97.8 F (36.6 C) (Oral)   Resp 14   SpO2 98%  Physical Exam Vitals and nursing note reviewed.  Constitutional:      Appearance: He is well-developed.  HENT:     Head: Atraumatic.  Eyes:     Extraocular  Movements: Extraocular movements intact.     Pupils: Pupils are equal, round, and reactive to light.  Cardiovascular:     Rate and Rhythm: Normal rate.  Pulmonary:     Effort: Pulmonary effort is normal.  Musculoskeletal:     Cervical back: Neck supple.  Skin:    General: Skin is warm.  Neurological:     Mental Status: He is alert and oriented to person, place, and time.     ED Results / Procedures / Treatments   Labs (all labs ordered are listed, but only abnormal results are displayed) Labs Reviewed - No data to display  EKG None  Radiology No results found.  Procedures Procedures    Medications Ordered in ED Medications  sodium chloride 0.9 % bolus 1,000 mL (1,000 mLs Intravenous New Bag/Given 09/11/23 2142)    ED Course/ Medical Decision Making/ A&P                                  Medical Decision Making Risk Prescription drug management.   33 year old patient comes in with chief complaint of alcohol intoxication and detox.  He has history of alcohol use disorder.  He was in remission for almost a year.  He has previous history of alcohol withdrawal seizures.  I reviewed patient's records including admission from last year.  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  He did not have DTs at that time.  It was a 3-day admission at that time.  Patient currently intoxicated.  He has no withdrawal symptoms at all.  I discussed with the patient that it might be best to put him on Librium taper, and it is possible that he might not need admission and we can reduce the risk of hospital-acquired complications.  Patient is quite comfortable with this plan.  We will give him Librium.  We have advised and encouraged that he follows up with be held for counseling, as it appears that his relapse was triggered by stress and coping skills issues.  I have also encouraged using AA or any other outpatient rehab services.  Return precautions discussed.  Patient requesting some IV fluid as he feels dehydrated.  Stable for discharge thereafter.  Final Clinical Impression(s) / ED Diagnoses Final diagnoses:  Alcoholic intoxication without complication (HCC)    Rx / DC Orders ED Discharge Orders          Ordered    chlordiazePOXIDE (LIBRIUM) 25 MG capsule        09/11/23 2159              Derwood Kaplan, MD 09/11/23 2218

## 2023-09-11 NOTE — ED Triage Notes (Signed)
 BIBA from home c/o ETOH withdrawal N/V abd pain, since Monday, after his last drink (09/06/23). Bp 142/106, CBG 94. Pt endorses weakness. Pt reports being sober for a year before drinking a pint of vodka Monday.

## 2023-09-12 ENCOUNTER — Emergency Department (HOSPITAL_COMMUNITY)
Admission: EM | Admit: 2023-09-12 | Discharge: 2023-09-12 | Disposition: A | Discharge status: Home / Self Care | Source: Home / Self Care | Attending: Emergency Medicine | Admitting: Emergency Medicine

## 2023-09-12 ENCOUNTER — Other Ambulatory Visit: Payer: Self-pay

## 2023-09-12 ENCOUNTER — Encounter (HOSPITAL_COMMUNITY): Payer: Self-pay

## 2023-09-12 DIAGNOSIS — F10220 Alcohol dependence with intoxication, uncomplicated: Secondary | ICD-10-CM | POA: Insufficient documentation

## 2023-09-12 DIAGNOSIS — F10920 Alcohol use, unspecified with intoxication, uncomplicated: Secondary | ICD-10-CM | POA: Diagnosis not present

## 2023-09-12 DIAGNOSIS — Z7901 Long term (current) use of anticoagulants: Secondary | ICD-10-CM | POA: Insufficient documentation

## 2023-09-12 MED ORDER — CHLORDIAZEPOXIDE HCL 25 MG PO CAPS: ORAL_CAPSULE | ORAL | 0 refills | Status: DC

## 2023-09-12 NOTE — ED Notes (Signed)
 Pt has concerns of going home, keeps stating feels like something bad will happen if he leaves when not drinking.  Pt informed RN that he has detox set up tomorrow.  Pt given sandwich tray and drink.  MD notified of pt concerns.

## 2023-09-12 NOTE — Discharge Instructions (Addendum)
 Please find a place to go to his that will help you stop drinking alcohol.  Follow up tomorrow.

## 2023-09-12 NOTE — ED Provider Notes (Signed)
 [place] EMERGENCY DEPARTMENT AT [facility] Provider Note   CSN: [id number] Arrival date & time: 09/12/23  1820     History  Chief Complaint  Patient presents with   Alcohol Intoxication    Brett Molina is a 33 y.o. male.  33 yo M with a chief complaint of alcohol intoxication.  Patient been drinking heavily today and then he decided to come to the ED for evaluation.  He was actually seen here yesterday.  He says he wants help from drinking he is worried if he goes home he will just start drinking again.  He denies any other specific complication.  His blood sugar was normal but it was in the low end of normal and so EMS gave him something to eat.        Home Medications Prior to Admission medications   Medication Sig Start Date End Date Taking? Authorizing Provider  apixaban (ELIQUIS) 5 MG TABS tablet Take 1 tablet (5 mg total) by mouth 2 (two) times daily. 07/30/22   Rai, Delene Ruffini, MD  chlordiazePOXIDE (LIBRIUM) 25 MG capsule 50mg  PO TID x 1D, then 25-50mg  PO BID X 1D, then 25-50mg  PO QD X 1D 09/11/23   Derwood Kaplan, MD  levETIRAcetam (KEPPRA) 500 MG tablet Take 1 tablet (500 mg total) by mouth 2 (two) times daily. 07/30/22   Rai, Ripudeep K, MD  lisinopril (ZESTRIL) 20 MG tablet Take 1 tablet (20 mg total) by mouth daily. 07/30/22   Rai, Ripudeep K, MD  magnesium oxide (MAG-OX) 400 (240 Mg) MG tablet Take 1 tablet (400 mg total) by mouth daily. 07/30/22   Rai, Delene Ruffini, MD  metoprolol tartrate (LOPRESSOR) 25 MG tablet Take 1 tablet (25 mg total) by mouth 2 (two) times daily. 07/30/22   Rai, Ripudeep K, MD  pantoprazole (PROTONIX) 40 MG tablet Take 1 tablet (40 mg total) by mouth daily. 07/30/22 11/27/22  Rai, Delene Ruffini, MD  silver sulfADIAZINE (SILVADENE) 1 % cream Apply topically daily. Apply Silvadene plus 4 x 4 gauze plus an Ace wrap to both feet ulcerative areas and change daily. Wear postoperative shoes for ambulation. 07/30/22   Rai, Delene Ruffini, MD   thiamine (VITAMIN B-1) 100 MG tablet Take 1 tablet (100 mg total) by mouth daily. 07/31/22   Cathren Harsh, MD      Allergies    Patient has no known allergies.    Review of Systems   Review of Systems  Physical Exam Updated Vital Signs BP 126/66   Pulse 92   Temp 97.9 F (36.6 C) (Oral)   Resp 16   Ht 6\' 2"  (1.88 m)   Wt 104.3 kg   SpO2 94%   BMI 29.53 kg/m  Physical Exam Vitals and nursing note reviewed.  Constitutional:      Appearance: He is well-developed.     Comments: Slurred speech.  Smells of alcohol.  HENT:     Head: Normocephalic and atraumatic.  Eyes:     Pupils: Pupils are equal, round, and reactive to light.  Neck:     Vascular: No JVD.  Cardiovascular:     Rate and Rhythm: Normal rate and regular rhythm.     Heart sounds: No murmur heard.    No friction rub. No gallop.  Pulmonary:     Effort: No respiratory distress.     Breath sounds: No wheezing.  Abdominal:     General: There is no distension.     Tenderness: There is no  abdominal tenderness. There is no guarding or rebound.  Musculoskeletal:        General: Normal range of motion.     Cervical back: Normal range of motion and neck supple.  Skin:    Coloration: Skin is not pale.     Findings: No rash.  Neurological:     Mental Status: He is alert and oriented to person, place, and time.  Psychiatric:        Behavior: Behavior normal.     ED Results / Procedures / Treatments   Labs (all labs ordered are listed, but only abnormal results are displayed) Labs Reviewed - No data to display  EKG None  Radiology No results found.  Procedures Procedures    Medications Ordered in ED Medications - No data to display  ED Course/ Medical Decision Making/ A&P                                 Medical Decision Making  33 yo M with a chief complaints of alcohol intoxication.  Patient was actually seen yesterday for the same.  He said he started drinking again and then decided to come  back.  He is worried that he needs help to stop drinking alcohol.  I had a discussion with him about finding a outpatient resource for this.  Patient told nursing actually has a appointment with the rehab center tomorrow.  He is now able to stand up and ambulate without issue.  Will discharge home.  10:02 PM:  I have discussed the diagnosis/risks/treatment options with the patient.  Evaluation and diagnostic testing in the emergency department does not suggest an emergent condition requiring admission or immediate intervention beyond what has been performed at this time.  They will follow up with PCP. We also discussed returning to the ED immediately if new or worsening sx occur. We discussed the sx which are most concerning (e.g., sudden worsening pain, fever, inability to tolerate by mouth) that necessitate immediate return. Medications administered to the patient during their visit and any new prescriptions provided to the patient are listed below.  Medications given during this visit Medications - No data to display   The patient appears reasonably screen and/or stabilized for discharge and I doubt any other medical condition or other [facility] requiring further screening, evaluation, or treatment in the ED at this time prior to discharge.          Final Clinical Impression(s) / ED Diagnoses Final diagnoses:  Alcohol dependence with uncomplicated intoxication [facility])    Rx / DC Orders ED Discharge Orders     None         [name], DO 09/12/23 2202

## 2023-09-12 NOTE — ED Triage Notes (Signed)
 Pt BIB [facility] from home for ETOH withdrawal. Appears currently intoxicated, unsteady gait.  140/palp 125 97% CBG 70

## 2023-09-12 NOTE — ED Notes (Signed)
 Pt resting comfortably in bed, alert to voice and is oriented x4, states last drank 1pint liquor last night, states lives alone and he's who called EMS because he wants help with  his drinking problem.

## 2023-09-21 ENCOUNTER — Other Ambulatory Visit: Payer: Self-pay

## 2023-09-21 ENCOUNTER — Encounter (HOSPITAL_COMMUNITY): Payer: Self-pay | Admitting: Emergency Medicine

## 2023-09-21 ENCOUNTER — Emergency Department (HOSPITAL_COMMUNITY)
Admission: EM | Admit: 2023-09-21 | Discharge: 2023-09-22 | Disposition: A | Discharge status: Home / Self Care | Attending: Emergency Medicine | Admitting: Emergency Medicine

## 2023-09-21 DIAGNOSIS — F10120 Alcohol abuse with intoxication, uncomplicated: Secondary | ICD-10-CM | POA: Insufficient documentation

## 2023-09-21 DIAGNOSIS — I10 Essential (primary) hypertension: Secondary | ICD-10-CM | POA: Diagnosis not present

## 2023-09-21 DIAGNOSIS — Z7901 Long term (current) use of anticoagulants: Secondary | ICD-10-CM | POA: Diagnosis not present

## 2023-09-21 DIAGNOSIS — Y908 Blood alcohol level of 240 mg/100 ml or more: Secondary | ICD-10-CM | POA: Diagnosis not present

## 2023-09-21 DIAGNOSIS — F1090 Alcohol use, unspecified, uncomplicated: Secondary | ICD-10-CM

## 2023-09-21 DIAGNOSIS — F10920 Alcohol use, unspecified with intoxication, uncomplicated: Secondary | ICD-10-CM

## 2023-09-21 DIAGNOSIS — Z79899 Other long term (current) drug therapy: Secondary | ICD-10-CM | POA: Insufficient documentation

## 2023-09-21 LAB — CBC
HCT: 43.2 % (ref 39.0–52.0)
Hemoglobin: 13.8 g/dL (ref 13.0–17.0)
MCH: 30 pg (ref 26.0–34.0)
MCHC: 31.9 g/dL (ref 30.0–36.0)
MCV: 93.9 fL (ref 80.0–100.0)
Platelets: 308 10*3/uL (ref 150–400)
RBC: 4.6 MIL/uL (ref 4.22–5.81)
RDW: 14.6 % (ref 11.5–15.5)
WBC: 5.7 10*3/uL (ref 4.0–10.5)
nRBC: 0 % (ref 0.0–0.2)

## 2023-09-21 LAB — COMPREHENSIVE METABOLIC PANEL
ALT: 98 U/L — ABNORMAL HIGH (ref 0–44)
AST: 107 U/L — ABNORMAL HIGH (ref 15–41)
Albumin: 4.7 g/dL (ref 3.5–5.0)
Alkaline Phosphatase: 83 U/L (ref 38–126)
Anion gap: 16 — ABNORMAL HIGH (ref 5–15)
BUN: 7 mg/dL (ref 6–20)
CO2: 20 mmol/L — ABNORMAL LOW (ref 22–32)
Calcium: 8.8 mg/dL — ABNORMAL LOW (ref 8.9–10.3)
Chloride: 102 mmol/L (ref 98–111)
Creatinine, Ser: 0.81 mg/dL (ref 0.61–1.24)
GFR, Estimated: >60 mL/min (ref >60)
Glucose, Bld: 93 mg/dL (ref 70–99)
Potassium: 4.1 mmol/L (ref 3.5–5.1)
Sodium: 138 mmol/L (ref 135–145)
Total Bilirubin: 0.7 mg/dL (ref 0.0–1.2)
Total Protein: 8.7 g/dL — ABNORMAL HIGH (ref 6.5–8.1)

## 2023-09-21 LAB — ETHANOL: Alcohol, Ethyl (B): 450 mg/dL (ref <10)

## 2023-09-21 NOTE — ED Triage Notes (Addendum)
 Patient BIB EMS c/o Etoh Withdrawal. Per report last alcohol intake was last night. Patient denies N/V. Hx of alcohol abuse.

## 2023-09-22 DIAGNOSIS — F10120 Alcohol abuse with intoxication, uncomplicated: Secondary | ICD-10-CM | POA: Diagnosis not present

## 2023-09-22 MED ORDER — CHLORDIAZEPOXIDE HCL 25 MG PO CAPS: ORAL_CAPSULE | ORAL | 0 refills | Status: AC

## 2023-09-22 MED ORDER — PHENOBARBITAL SODIUM 130 MG/ML IJ SOLN
130.0000 mg | Freq: Once | INTRAMUSCULAR | Status: AC
  Administered 2023-09-22: 130 mg via INTRAMUSCULAR
  Filled 2023-09-22: qty 1

## 2023-09-22 NOTE — ED Provider Notes (Addendum)
 [place] EMERGENCY DEPARTMENT AT [facility] Provider Note  CSN: [id number] Arrival date & time: 09/21/23 2028  Chief Complaint(s) Alcohol Intoxication  HPI Brett Molina is a 33 y.o. male With a past medical history listed below including alcohol use disorder here for alcohol intoxication stating the he relapsed last week after being sober for a year and a half.  States that looking to quit but is unable to on his own.  States that he drinks 1-2 bottles of wine per day.  Reports withdrawal symptoms including jitteriness and sweating.  Denies any SI. No other complaints.   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   Alcohol withdrawal delirium (HCC) 07/27/2022   Skin ulcer of great toe, limited to breakdown of skin (HCC) 07/16/2022   Hyperphosphatemia 07/13/2022   Seizure (HCC) 07/12/2022   Aspiration pneumonitis (HCC) 06/21/2022   Acute metabolic encephalopathy 06/21/2022   Acute respiratory failure with hypoxia and hypercapnia (HCC) 06/20/2022   Hypothermia 06/18/2022   Obesity (BMI 30-39.9) 10/07/2021   Rhabdomyolysis 10/03/2021   Hyponatremia 10/02/2021   Pancytopenia (HCC) 10/02/2021   AKI (acute kidney injury) (HCC) 10/02/2021   Alcohol withdrawal with inpatient treatment (HCC) 10/02/2021   Fever 10/02/2021   Alcohol use disorder, severe, dependence (HCC)    Ataxia 11/20/2018   HTN (hypertension) 11/20/2018   UGIB (upper gastrointestinal bleed) 08/13/2018   Intractable vomiting with nausea    Gastroesophageal reflux disease with esophagitis    GI bleed 07/23/2018   Upper GI bleeding 07/23/2018   Alcohol abuse 02/18/2017   Alcohol withdrawal seizure with complication (HCC) 02/16/2017   Acute alcohol intoxication (HCC) 12/12/2016   Hypokalemia 12/12/2016   Alcoholic hepatitis without ascites 12/12/2016   MDD  (major depressive disorder) 12/12/2016   Home Medication(s) Prior to Admission medications   Medication Sig Start Date End Date Taking? Authorizing Provider  chlordiazePOXIDE (LIBRIUM) 25 MG capsule 50mg  PO TID x 1D, then 25-50mg  PO BID X 1D, then 25-50mg  PO QD X 1D 09/22/23  Yes Louan Base, Amadeo Garnet, MD  apixaban (ELIQUIS) 5 MG TABS tablet Take 1 tablet (5 mg total) by mouth 2 (two) times daily. 07/30/22   Rai, Delene Ruffini, MD  levETIRAcetam (KEPPRA) 500 MG tablet Take 1 tablet (500 mg total) by mouth 2 (two) times daily. 07/30/22   Rai, Ripudeep K, MD  lisinopril (ZESTRIL) 20 MG tablet Take 1 tablet (20 mg total) by mouth daily. 07/30/22   Rai, Ripudeep K, MD  magnesium oxide (MAG-OX) 400 (240 Mg) MG tablet Take 1 tablet (400 mg total) by mouth daily. 07/30/22   Rai, Delene Ruffini, MD  metoprolol tartrate (LOPRESSOR) 25 MG tablet Take 1 tablet (25 mg total) by mouth 2 (two) times daily. 07/30/22   Rai, Ripudeep K, MD  pantoprazole (PROTONIX) 40 MG tablet Take 1 tablet (40 mg total) by mouth daily. 07/30/22 11/27/22  Rai, Delene Ruffini, MD  silver sulfADIAZINE (SILVADENE) 1 % cream Apply topically daily. Apply Silvadene plus 4 x 4 gauze plus an Ace wrap to both feet ulcerative areas and change daily. Wear postoperative shoes for ambulation. 07/30/22   Rai, Delene Ruffini, MD  thiamine (VITAMIN B-1) 100 MG tablet Take 1 tablet (100 mg total) by mouth daily. 07/31/22   Cathren Harsh, MD  Allergies Patient has no known allergies.  Review of Systems Review of Systems As noted in HPI  Physical Exam Vital Signs  I have reviewed the triage vital signs BP 107/62 (BP Location: Left Arm)   Pulse 98   Temp 98.7 F (37.1 C) (Oral)   Resp 18   SpO2 97%   Physical Exam Vitals reviewed.  Constitutional:      General: He is not in acute distress.    Appearance: He is well-developed.  He is not diaphoretic.  HENT:     Head: Normocephalic and atraumatic.     Right Ear: External ear normal.     Left Ear: External ear normal.     Nose: Nose normal.     Mouth/Throat:     Mouth: Mucous membranes are moist.  Eyes:     General: No scleral icterus.    Conjunctiva/sclera: Conjunctivae normal.  Neck:     Trachea: Phonation normal.  Cardiovascular:     Rate and Rhythm: Normal rate and regular rhythm.  Pulmonary:     Effort: Pulmonary effort is normal. No respiratory distress.     Breath sounds: No stridor.  Abdominal:     General: There is no distension.  Musculoskeletal:        General: Normal range of motion.     Cervical back: Normal range of motion.  Neurological:     Mental Status: He is alert and oriented to person, place, and time.  Psychiatric:        Behavior: Behavior normal.     ED Results and Treatments Labs (all labs ordered are listed, but only abnormal results are displayed) Labs Reviewed  COMPREHENSIVE METABOLIC PANEL - Abnormal; Notable for the following components:      Result Value   CO2 20 (*)    Calcium 8.8 (*)    Total Protein 8.7 (*)    AST 107 (*)    ALT 98 (*)    Anion gap 16 (*)    All other components within normal limits  ETHANOL - Abnormal; Notable for the following components:   Alcohol, Ethyl (B) 450 (*)    All other components within normal limits  CBC  RAPID URINE DRUG SCREEN, HOSP PERFORMED                                                                                                                         EKG  EKG Interpretation Date/Time:    Ventricular Rate:    PR Interval:    QRS Duration:    QT Interval:    QTC Calculation:   R Axis:      Text Interpretation:         Radiology No results found.  Medications Ordered in ED Medications  PHENObarbital (LUMINAL) injection 130 mg (130 mg Intramuscular Given 09/22/23 0617)   Procedures Procedures  (including critical care time) Medical Decision Making  / ED Course   Medical Decision Making Amount and/or Complexity of Data  Reviewed Labs: ordered. Decision-making details documented in ED Course.  Risk Prescription drug management.    Patient is here without intoxication.  Labs confirm with an alcohol level of 450.  CMP without significant electrolyte derangements.  Transaminitis consistent with alcohol use disorder.  Patient allowed to metabolize to freedom in the emergency department. Given a dose of phenobarb for mild w/d sx. Patient has done well after Librium taper outpatient.  Believe he is still a good candidate for this.    Final Clinical Impression(s) / ED Diagnoses Final diagnoses:  Alcoholic intoxication without complication (HCC)  Alcohol use disorder   The patient appears reasonably screened and/or stabilized for discharge and I doubt any other medical condition or other [facility] requiring further screening, evaluation, or treatment in the ED at this time. I have discussed the findings, Dx and Tx plan with the patient/family who expressed understanding and agree(s) with the plan. Discharge instructions discussed at length. The patient/family was given strict return precautions who verbalized understanding of the instructions. No further questions at time of discharge.  Disposition: Discharge  Condition: Good  ED Discharge Orders          Ordered    chlordiazePOXIDE (LIBRIUM) 25 MG capsule        09/22/23 0509            [facility] narcotic database reviewed and no active prescriptions noted.   Follow Up: [facility] Address: [address], [place], [place] [address] Hours: Open 24 hours Monday through Sunday Phone: [phone] Go to  as needed    This chart was dictated using voice recognition software.  Despite best efforts to proofread,  errors can occur which can change the documentation meaning.       Nira Conn, MD 09/22/23 [id number]
